# Patient Record
Sex: Male | Born: 1935 | Race: White | Hispanic: No | Smoking: Former smoker
Health system: Southern US, Community
[De-identification: ages and names within clinical notes are randomized; demographics above are authoritative.]

## PROBLEM LIST (undated history)

## (undated) DIAGNOSIS — I4891 Unspecified atrial fibrillation: Secondary | ICD-10-CM

## (undated) DIAGNOSIS — M5136 Other intervertebral disc degeneration, lumbar region: Secondary | ICD-10-CM

## (undated) DIAGNOSIS — I10 Essential (primary) hypertension: Secondary | ICD-10-CM

## (undated) DIAGNOSIS — E079 Disorder of thyroid, unspecified: Secondary | ICD-10-CM

## (undated) DIAGNOSIS — C61 Malignant neoplasm of prostate: Secondary | ICD-10-CM

## (undated) DIAGNOSIS — N390 Urinary tract infection, site not specified: Secondary | ICD-10-CM

## (undated) DIAGNOSIS — G43909 Migraine, unspecified, not intractable, without status migrainosus: Secondary | ICD-10-CM

## (undated) DIAGNOSIS — D696 Thrombocytopenia, unspecified: Secondary | ICD-10-CM

## (undated) DIAGNOSIS — N2889 Other specified disorders of kidney and ureter: Secondary | ICD-10-CM

## (undated) DIAGNOSIS — E039 Hypothyroidism, unspecified: Secondary | ICD-10-CM

## (undated) DIAGNOSIS — I639 Cerebral infarction, unspecified: Secondary | ICD-10-CM

## (undated) DIAGNOSIS — M51369 Other intervertebral disc degeneration, lumbar region without mention of lumbar back pain or lower extremity pain: Secondary | ICD-10-CM

## (undated) DIAGNOSIS — R1310 Dysphagia, unspecified: Secondary | ICD-10-CM

## (undated) HISTORY — DX: Malignant neoplasm of prostate: C61

## (undated) HISTORY — PX: LAPAROSCOPIC CHOLECYSTECTOMY: SUR755

## (undated) HISTORY — PX: PROSTATE BIOPSY: SHX241

## (undated) HISTORY — PX: ESOPHAGOGASTRODUODENOSCOPY: SHX1529

---
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
## 1997-05-25 HISTORY — PX: COLONOSCOPY: SHX174

## 2002-01-09 ENCOUNTER — Ambulatory Visit (HOSPITAL_COMMUNITY): Admission: RE | Admit: 2002-01-09 | Discharge: 2002-01-09 | Payer: Self-pay | Admitting: Internal Medicine

## 2007-07-07 ENCOUNTER — Emergency Department (HOSPITAL_COMMUNITY): Admission: EM | Admit: 2007-07-07 | Discharge: 2007-07-07 | Payer: Self-pay | Admitting: Emergency Medicine

## 2007-10-18 ENCOUNTER — Ambulatory Visit (HOSPITAL_COMMUNITY): Admission: RE | Admit: 2007-10-18 | Discharge: 2007-10-18 | Payer: Self-pay | Admitting: Family Medicine

## 2007-11-15 ENCOUNTER — Ambulatory Visit: Payer: Self-pay | Admitting: Gastroenterology

## 2007-11-24 ENCOUNTER — Ambulatory Visit (HOSPITAL_COMMUNITY): Admission: RE | Admit: 2007-11-24 | Discharge: 2007-11-24 | Payer: Self-pay | Admitting: Gastroenterology

## 2007-11-24 ENCOUNTER — Encounter: Payer: Self-pay | Admitting: Gastroenterology

## 2007-11-24 ENCOUNTER — Ambulatory Visit: Payer: Self-pay | Admitting: Gastroenterology

## 2008-01-18 ENCOUNTER — Ambulatory Visit: Payer: Self-pay | Admitting: Gastroenterology

## 2008-02-07 ENCOUNTER — Ambulatory Visit (HOSPITAL_COMMUNITY): Admission: RE | Admit: 2008-02-07 | Discharge: 2008-02-07 | Payer: Self-pay | Admitting: Family Medicine

## 2008-06-05 ENCOUNTER — Ambulatory Visit (HOSPITAL_COMMUNITY): Admission: RE | Admit: 2008-06-05 | Discharge: 2008-06-05 | Payer: Self-pay | Admitting: Family Medicine

## 2008-06-21 ENCOUNTER — Encounter (HOSPITAL_COMMUNITY): Admission: RE | Admit: 2008-06-21 | Discharge: 2008-07-21 | Payer: Self-pay | Admitting: Family Medicine

## 2008-11-12 ENCOUNTER — Ambulatory Visit (HOSPITAL_COMMUNITY): Admission: RE | Admit: 2008-11-12 | Discharge: 2008-11-12 | Payer: Self-pay | Admitting: Family Medicine

## 2008-12-06 ENCOUNTER — Ambulatory Visit (HOSPITAL_COMMUNITY): Admission: RE | Admit: 2008-12-06 | Discharge: 2008-12-06 | Payer: Self-pay | Admitting: Family Medicine

## 2008-12-12 ENCOUNTER — Ambulatory Visit: Payer: Self-pay | Admitting: Orthopedic Surgery

## 2008-12-12 DIAGNOSIS — M84376A Stress fracture, unspecified foot, initial encounter for fracture: Secondary | ICD-10-CM | POA: Insufficient documentation

## 2008-12-12 DIAGNOSIS — S92309A Fracture of unspecified metatarsal bone(s), unspecified foot, initial encounter for closed fracture: Secondary | ICD-10-CM | POA: Insufficient documentation

## 2009-01-29 ENCOUNTER — Ambulatory Visit: Payer: Self-pay | Admitting: Orthopedic Surgery

## 2010-06-16 ENCOUNTER — Encounter: Payer: Self-pay | Admitting: Family Medicine

## 2010-10-07 NOTE — Consult Note (Signed)
NAME:  Ethan Faulkner, Ethan Faulkner NO.:  [id number]   MEDICAL RECORD NO.:  [id number]         PATIENT TYPE:  PAMB   LOCATION:  DAY                           FACILITY:  [facility]   PHYSICIAN:  Kassie Mends, M.D.      DATE OF BIRTH:  Dec 21, 1915   DATE OF CONSULTATION:  11/15/2007  DATE OF DISCHARGE:                                 CONSULTATION   REFERRING PHYSICIAN:  Scott A. Luking, MD   REASON FOR CONSULTATION:  Abdominal pain and weight loss.   HISTORY OF PRESENT ILLNESS:  Ethan Faulkner is an 77 year old male who was  last seen by Dr. Karilyn Cota in 2003.  He had a colonoscopy in 2000, which  showed 8 small polyps in the ascending colon.  The large one was 8-9 mm.  The pathology revealed tubular adenomas.  He was seen in 2003  complaining of uncontrolled reflux according to the note.  A new onset  of heartburn.  EGD revealed erosive gastritis with pyloric channel  inflammation.  He presents today with unintentional weight loss over the  last 6 years.  His daughter thinks it is because his wife's health has  declined and he is continually caring for her.  He states his appetite  is good.  He complains of abdominal pain off and on for some time.  He  describes as a burning sensation in the mid upper and mid lower abdomen.  He also has lots of gas.  The pain occurs at least once a week.  Occasionally, he has been awakening at nighttime with pain.  He was  being maintained on Celebrex.  He also was given nabumetone in May 2009  for approximately 7-10 days for low back pain.  His low back pain is  better now, he is using it intermittently.  He also had a history of  using some Advil every now and then.  He denies any aspirin.  He denies  any blood in stool, black stools, problem swallowing, chest pain,  shortness of breath, dyspnea on exertion, nausea, or vomiting.  He gets  5-6 hours of sleep at night.  He had no problems with the prep with  sedation previously.  He has epigastric burning  pain, it is brought only  by spicy foods.  He has 1-2 bowel movements a week.  MiraLax 17 g daily  for 2 doses gave him diarrhea.   PAST MEDICAL HISTORY:  1. Prostate cancer.  2. Back pain.  3. Constipation.   PAST SURGICAL HISTORY:  Cholecystectomy due to gallstones approximately  25 years ago.   ALLERGIES:  No known drug allergies.   MEDICATIONS:  1. Protonix 40 mg daily.  2. Nabumetone 500 mg as needed.  3. Docusate sodium 240 mg daily.  4. Advil as needed.   FAMILY HISTORY:  He denies any family history of colon cancer or colon  polyps.   SOCIAL HISTORY:  He is very active.  His family had to take away his  ladder, so he could not get up on the roof and put roof on.  He has been  putting on  the siding.  He is married.  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  He rarely smoked in the past.  He  denies any alcohol use.  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   REVIEW OF SYSTEMS:  Protonix has made the abdominal pain better.  His  review of systems per the HPI, otherwise all systems are negative.   PHYSICAL EXAM:  Weight 142 pounds, height 5 feet 10, BMI 20.4 (healthy),  temperature 98.7, blood pressure 138/72, pulse 72.  In general, he is in  no apparent distress, alert and oriented x4.  He is hard of hearing.  HEENT exam, atraumatic, normocephalic.  Pupils are equal and reactive to  light.  Mouth, no oral lesions.  Posterior pharynx without erythema or  exudate.  His neck has full range of motion, no lymphadenopathy.  His  lungs are clear to auscultation bilaterally.  Cardiovascular exam has  regular rhythm, no murmur, normal S1 and S2.  His abdomen, bowel sounds  are present, soft, nontender, and nondistended.  No rebound or guarding,  no abdominal bruits, pulsatile masses, or hepatosplenomegaly.  Extremities, no cyanosis or edema.  Neuro, he has no focal neurologic  deficits.   LABS:  On Oct 18, 2005,  white count 3.9, hemoglobin 14.5, platelets 144,  BUN 18, creatinine 0.95, total bili 0.5, alk phos 46, AST 16, ALT 10,  and albumin 3.9.   ASSESSMENT:  Ethan Faulkner is an 77 year old male with epigastric and lower  pelvic pain.  The differential diagnosis includes functional abdominal  pain, gastroesophageal reflux disease, peptic ulcer disease, and a low  likelihood of colon cancer, or gastric malignancy.  Thank you for  allowing me to see Ethan Faulkner in consultation.  My recommendation is as  follow.   RECOMMENDATIONS:  1. Will schedule colonoscopy for his history of abdominal pain, weight      loss, and polyps to rule out colorectal cancer and he will be      scheduled for an upper endoscopy for his new onset dyspepsia, which      is likely related to NSAID use or Helicobacter pylori gastritis.      During his last upper endoscopy, his gastric mucosa was not      biopsied.  2. He should continue his Protonix daily.  3. He will be scheduled a follow up appointment with me as needed.      Kassie Mends, M.D.  Electronically Signed     SM/MEDQ  D:  11/15/2007  T:  11/16/2007  Job:  [id number]   cc:   Lorin Picket A. Gerda Diss, MD  Fax: [phone]   Lionel December, M.D.  Fax: [phone]

## 2010-10-07 NOTE — Assessment & Plan Note (Signed)
NAME:  MOUSTAFA, MOSSA               CHART#:  [id number]   DATE:  01/18/2008                       DOB:  [birth year]   REFERRING PHYSICIAN:  Scott A. Luking, MD   PROBLEM LIST:  1. Epigastric pain secondary to H. pylori gastritis.  2. Simple adenomas on colonoscopy in July 2009.  3. History of prostrate cancer.  4. Back pain.  5. Constipation.  6. Cholecystectomy due to gallstones approximately 25 years ago.   SUBJECTIVE:  The patient is an 77 year old male who was initially seen  in June 2009.  He was complaining of weight loss and abdominal pain.  EGD with biopsy revealed H. pylori gastritis.  He was treated with  amoxicillin and Flagyl.  His abdominal pain has now resolved.  He denies  taking any nabumetone or Advil as needed.  His daughter is not certain  that he is taking Protonix.   OBJECTIVE:  VITAL SIGNS:  Weight 147 pounds (up 5 pounds since June  2009), height 5 feet 10 inches, BMI 21.1 (healthy), temperature 98.2,  blood pressure 112/82, and pulse 60.  GENERAL:  He is in no apparent distress.  Alert and interactive.LUNGS:  Clear to auscultation bilaterally.CARDIOVASCULAR:  Regular  rhythm.ABDOMEN:  Bowel sounds are present, soft, nontender and  nondistended.   ASSESSMENT:  The patient is an 77 year old male who had epigastric pain,  which is secondary to H. pylori gastritis.  He is no longer taking  NSAIDs.  Thank you for allowing me to see the patient in consultation.  My recommendations is as follow.   RECOMMENDATIONS:  1. The patient and his daughter are informed that he should take      Protonix if he restarts any antiinflammatory drugs such as      Celebrex, nabumetone, or Advil.  2. He may follow with me as needed.  3. If he remains healthy, he should have a colonoscopy in 10-15 years.       Kassie Mends, M.D.  Electronically Signed     SM/MEDQ  D:  01/18/2008  T:  01/18/2008  Job:  [id number]   cc:   Lorin Picket A. Gerda Diss, MD

## 2010-10-07 NOTE — Op Note (Signed)
NAME:  Ethan Faulkner, Ethan Faulkner NO.:  [id number]   MEDICAL RECORD NO.:  [id number]          PATIENT TYPE:  AMB   LOCATION:  DAY                           FACILITY:  [facility]   PHYSICIAN:  Kassie Mends, M.D.      DATE OF BIRTH:  10-May-1916   DATE OF PROCEDURE:  DATE OF DISCHARGE:                               OPERATIVE REPORT   REFERRING PHYSICIAN:  Scott A. Gerda Diss, MD   PROCEDURE:  1. Ileocolonoscopy with cold forceps polypectomy and snare cautery      polypectomy.  2. Esophagogastroduodenoscopy with cold forceps biopsy.   INDICATIONS FOR EXAM:  Mr. Campanaro is an 77 year old male with history of  prostate cancer and back pain.  He was taken Celebrex, nabumetone, and  Advil.  He complains of epigastric burning pain.  He also has lower  pelvic pain.   FINDINGS:  1. Normal terminal ileum, approximately 10 cm visualized.  2. Multiple polyps removed in the ascending colon, transverse colon,      and sigmoid colon.  Each polyp was between 3-6 mm.  The largest      polyp, which was in the ascending colon was removed via snare      cautery.  All other polyps were removed via cold forceps.  They      were sessile.  3. Rare sigmoid colon diverticulosis and transverse colon diverticula.      Otherwise no masses, inflammatory changes, or AVM seen.  4. Normal retroflexed view of the rectum.  5. Normal esophagus without evidence of Barrett's mass, erosion,      ulceration, or stricture.  6. Diffuse erythema beginning in the proximal stomach and extending      into the antrum.  He had few erosions in the antrum.  Biopsies      obtained via cold forceps to evaluate for H. pylori gastritis.  7. Normal duodenal bulb and second portion of the duodenum with      moderate bile staining.   DIAGNOSES:  1. The most likely etiology for Mr. Castleman's epigastric pain is      gastritis.  2. No source for his lower pelvic pain identified.   RECOMMENDATIONS:  1. He should stop nabumetone and  Advil for 30 days.  He should use      Tylenol Arthritis as needed for pain.  2. He should follow high-fiber diet.  He is given a handout on high-      fiber diet.  He should avoid gastric irritants.  He is given a      handout on gastric irritants and gastritis.  3. Will call Mr. Hamm with the results of his polypectomy.  4. If his lower pelvic pain exists or persist then would see urology.  5. He was given a handout on polyps.   MEDICATIONS:  1. Demerol 50 mg IV.  2. Versed 3 mg IV.   PROCEDURE TECHNIQUE:  Physical exam was performed.  Informed consent was  obtained from the patient after explaining the benefits, risks, and  alternatives to the procedure.  The patient was connected to monitor and  placed in left lateral position.  Continuous oxygen was provided by  nasal cannula.  IV medicine administered through an indwelling cannula.  After administration of sedation and rectal exam, the patient's rectum  was intubated.  The scope was advanced under direct visualization to the  distal terminal ileum.  Scope was removed slowly by carefully examining  the color, texture, anatomy, and integrity of the mucosa on the way out.  The patient was recovered in endoscopy.   After the colonoscopy, the patient's esophagus was intubated.  The scope  was advanced under direct visualization to the second portion of the  duodenum.  Scope was removed slowly by carefully examining the color,  texture, anatomy, and integrity of the mucosa on the way out.  The  patient was recovered in endoscopy discharged home in satisfactory  condition.   PATH:  Simple adenomas. H. pylori gastritis.      Kassie Mends, M.D.  Electronically Signed     SM/MEDQ  D:  11/24/2007  T:  11/25/2007  Job:  [id number]   cc:   Lorin Picket A. Gerda Diss, MD  Fax: [phone]

## 2010-10-10 NOTE — Consult Note (Signed)
NAME:  Ethan Faulkner, Ethan L.                       ACCOUNT NO.:  [id number]   MEDICAL RECORD NO.:  [id number]                  PATIENT TYPE:   LOCATION:                                       FACILITY:   PHYSICIAN:  Lionel December, M.D.                 DATE OF BIRTH:  05/26/1915   DATE OF CONSULTATION:  01/03/2002  DATE OF DISCHARGE:                           GASTROENTEROLOGY CONSULTATION   PRESENTING COMPLAINT:  Burning retrosternal and epigastric pain of recent  onset.   HISTORY OF PRESENT ILLNESS:  The patient is an 77 year old Caucasian male  who was referred through the courtesy of Dr. Regino Schultze for evaluation of  recent onset of heartburn and epigastric burning.  These symptoms started  maybe two or two and a half weeks ago.  For a few days he had intractable  heartburn.  He could not even lie flat and had to rest in a propped up  position.  He also noted drop in his appetite, but did not experience any  nausea, vomiting, hoarseness, cough, dyspnea, melena, or rectal bleeding.  He has had heartburn occasionally prior to this episode, but nothing quite  like this time.  He was seen by Dr. Ethlyn Gallery on December 29, 2001 and he  was begun on Zantac 150 mg p.o. b.i.d.  After having it a few days he felt a  lot better.  He states his appetite is back.  He also had an episode of  precordial pain which was fleeting.  He had an EKG by Dr. Regino Schultze which was  within normal limits.  Review of systems also positive for intermittent  constipation.  He used an enema earlier today and feels sore in his anal  area.  He has lost 9 or 10 pounds within the last few weeks.  He does not  take any NSAIDs.   He is on Zantac 150 mg p.o. b.i.d. and Lupron injection every three months.   PAST MEDICAL HISTORY:  History of prostate CA.  Initially received radiation  therapy about eight years and now on maintenance therapy with Lupron.  His  last PSA was very low.  He had total colonoscopy in May 2000  because of heme-  positive stools.  He had a 12 mm tubular adenoma removed from his ascending  colon.  Follow-up colonoscopy is recommended in five years which would be a  little less than two years from now.  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  He had cholecystectomy about seven years  ago.   ALLERGIES:  None known.   FAMILY HISTORY:  Both parents are deceased.  He has four brothers and two  sisters living and are in fair health.  He lost one sister at young age of  typhoid fever.   SOCIAL HISTORY:  He is married and has seven children in good health.  He  worked at [employer] for over  36 years.  He is now retired.  He has never smoked  cigarettes and does not drink alcohol.   PHYSICAL EXAMINATION:  GENERAL:  Pleasant, well-developed, thin Caucasian male.  Appears to be in  no acute distress.   VITAL SIGNS:  Weight 154 pounds, height 5 feet 10 inches, pulse 76 per  minute, blood pressure 160/90.  He is afebrile.   HEENT:  Conjunctivae are pink.  Sclerae are nonicteric.  Oropharyngeal  mucosa is normal.  He is edentulous.  He does have dentures at home.   NECK:  Without masses or thyromegaly.  Carotids are 2+ bilaterally without  bruits.   CARDIAC:  Regular rhythm.  Normal S1 and S3.  No murmur or gallop noted.   LUNGS:  Clear to auscultation.   ABDOMEN:  Symmetrical.  Bowel sounds are normal.  Palpation reveals soft  abdomen without tenderness, organomegaly, or masses.   RECTAL:  Trace heme-positive stool on the edge.   EXTREMITIES:  No peripheral edema or clubbing noted.   ASSESSMENT:  The patient is an 77 year old Caucasian male with a few weeks'  history of intractable heartburn and epigastric discomfort who has responded  rather dramatically to H2B.  This is an exception rather than rule because  most people do not respond well to H2B.  He also had some weight loss  associated with these symptoms but this seemed to have leveled off.   His  stool is heme-positive.  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  Given the more or less acute nature of his  symptoms, he needs to have esophagogastroduodenoscopy to make sure he does  not have unsuspected disease in his upper gastrointestinal tract.   History of colonic polyps.  He will need another colonoscopy in May 2005.   Constipation.   RECOMMENDATIONS:  He is to continue Zantac at 150 mg p.o. b.i.d.  Antireflux  measures recommended.  Written instructions given.  I would like to increase  fiber in his diet and start Colace one to two tablets q.d.  Diagnostic  esophagogastroduodenoscopy to be performed at [facility] in near future.   I have reviewed the procedure risks with the patient.  He wants to have it  done with conscious sedation.   I would like to thank Dr. Regino Schultze for giving [place] the opportunity to  participate in the care of this gentleman.                                               Lionel December, M.D.    NR/MEDQ  D:  01/03/2002  T:  01/09/2002  Job:  [id number]   cc:   Kirk Ruths, M.D.  [address]  [place]  [place] [address]  Fax: [phone]

## 2010-10-10 NOTE — Op Note (Signed)
NAME:  Ethan Faulkner, Ethan Faulkner NO.:  [id number]   MEDICAL RECORD NO.:  [id number]                   PATIENT TYPE:  AMB   LOCATION:  DAY                                  FACILITY:  [facility]   PHYSICIAN:  Lionel December, M.D.                 DATE OF BIRTH:  05/22/1916   DATE OF PROCEDURE:  DATE OF DISCHARGE:                                 OPERATIVE REPORT   PROCEDURE:  Esophagogastroduodenoscopy.   ENDOSCOPIST:  Lionel December, M.D.   INDICATIONS:  This patient is an 77 year old Caucasian male with new onset  of reflux esophagitis with intractable symptoms who has surprisingly  responded to H2P.  He is undergoing EGD to make sure that he does not have  peptic ulcer disease or Barrett esophagus.  The procedure and risks were  reviewed with the patient  and informed consent was obtained.   PREOPERATIVE MEDICATIONS:  Cetacaine spray for pharyngeal topical  anesthesia, Demerol 25 mg IV and Versed 2.5 mg IV.   INSTRUMENT:  Olympus video system.   FINDINGS:  Procedure performed in endoscopy suite.  The patient's vital  signs and O2 saturation were monitored during the procedure and remained  stable.  The patient was placed in the left lateral recumbent position and  endoscope was passed via the oropharynx without any difficulty into the  esophagus.   There was a tiny cyst along the outer aspect of the right epiglottic fold.  Pictures were taken for record.   ESOPHAGUS:  Mucosa of the esophagus was normal throughout.  Squamocolumnar  junction was serrated or wavy but there was no hiatal hernia.   STOMACH:  It was empty and distended very well with insufflation.  The folds  of the proximal stomach were prominent.  The mucosal was normal.  Antral  mucosa revealed a few small erosions.  The pylorus was somewhat deformed and  the mucosa was friable on passing the scope through it.  It was felt to be  patent.  There was no ulcer crater.  The scope was retroflexed to  exam the  angularis, fundus, and cardia; all of which were normal.   DUODENUM:  Examination of the bulb and second part of the duodenum was  normal.   The endoscope was withdrawn.  The patient tolerated the procedure well.   FINAL DIAGNOSES:  1. Serrated gastroesophageal junction but no evidence of erosive esophagitis     or Barrett's segment.  2. Erosive gastritis with pyloric channel inflammation.  Pylorus, however,     is patent.  3. Small mucosal cyst at right hypopharynx.   RECOMMENDATIONS:  He will continue antireflux measures with Zantac 150 mg  p.o. b.i.d.  H. pylori serology will be checked.  If it is positive, he will  be treated.   As long as symptoms are well controlled with H2P he will stay on it;  however, if he has breakthrough symptoms  he will be switched to PPI.                                               Lionel December, M.D.    NR/MEDQ  D:  01/09/2002  T:  01/09/2002  Job:  [id number]   cc:   Kirk Ruths, M.D.  [address]  [place]  [place] [address]  Fax: [phone]

## 2010-12-04 ENCOUNTER — Other Ambulatory Visit (HOSPITAL_COMMUNITY): Payer: Self-pay | Admitting: Urology

## 2010-12-05 ENCOUNTER — Ambulatory Visit (HOSPITAL_COMMUNITY)
Admission: RE | Admit: 2010-12-05 | Discharge: 2010-12-05 | Disposition: A | Discharge status: Home / Self Care | Payer: Medicare Other | Source: Ambulatory Visit | Attending: Urology | Admitting: Urology

## 2010-12-05 DIAGNOSIS — N289 Disorder of kidney and ureter, unspecified: Secondary | ICD-10-CM | POA: Insufficient documentation

## 2010-12-05 DIAGNOSIS — R319 Hematuria, unspecified: Secondary | ICD-10-CM | POA: Insufficient documentation

## 2010-12-05 DIAGNOSIS — M899 Disorder of bone, unspecified: Secondary | ICD-10-CM | POA: Insufficient documentation

## 2010-12-05 DIAGNOSIS — R1031 Right lower quadrant pain: Secondary | ICD-10-CM | POA: Insufficient documentation

## 2010-12-05 MED ORDER — IOHEXOL 300 MG/ML  SOLN
125.0000 mL | Freq: Once | INTRAMUSCULAR | Status: AC | PRN
  Administered 2010-12-05: 125 mL via INTRAVENOUS

## 2011-05-28 DIAGNOSIS — J40 Bronchitis, not specified as acute or chronic: Secondary | ICD-10-CM | POA: Diagnosis not present

## 2011-05-28 DIAGNOSIS — J111 Influenza due to unidentified influenza virus with other respiratory manifestations: Secondary | ICD-10-CM | POA: Diagnosis not present

## 2011-07-07 DIAGNOSIS — C61 Malignant neoplasm of prostate: Secondary | ICD-10-CM | POA: Diagnosis not present

## 2011-07-10 DIAGNOSIS — J069 Acute upper respiratory infection, unspecified: Secondary | ICD-10-CM | POA: Diagnosis not present

## 2011-07-14 DIAGNOSIS — C61 Malignant neoplasm of prostate: Secondary | ICD-10-CM | POA: Diagnosis not present

## 2011-09-10 DIAGNOSIS — J441 Chronic obstructive pulmonary disease with (acute) exacerbation: Secondary | ICD-10-CM | POA: Diagnosis not present

## 2011-09-10 DIAGNOSIS — K819 Cholecystitis, unspecified: Secondary | ICD-10-CM | POA: Diagnosis not present

## 2011-10-25 DIAGNOSIS — R05 Cough: Secondary | ICD-10-CM | POA: Diagnosis not present

## 2011-11-03 DIAGNOSIS — J209 Acute bronchitis, unspecified: Secondary | ICD-10-CM | POA: Diagnosis not present

## 2011-11-04 DIAGNOSIS — R5381 Other malaise: Secondary | ICD-10-CM | POA: Diagnosis not present

## 2011-11-04 DIAGNOSIS — R5383 Other fatigue: Secondary | ICD-10-CM | POA: Diagnosis not present

## 2011-11-04 DIAGNOSIS — E785 Hyperlipidemia, unspecified: Secondary | ICD-10-CM | POA: Diagnosis not present

## 2011-11-04 DIAGNOSIS — Z79899 Other long term (current) drug therapy: Secondary | ICD-10-CM | POA: Diagnosis not present

## 2011-11-04 DIAGNOSIS — Z0000 Encounter for general adult medical examination without abnormal findings: Secondary | ICD-10-CM | POA: Diagnosis not present

## 2011-11-17 DIAGNOSIS — Z0000 Encounter for general adult medical examination without abnormal findings: Secondary | ICD-10-CM | POA: Diagnosis not present

## 2011-11-17 DIAGNOSIS — Z125 Encounter for screening for malignant neoplasm of prostate: Secondary | ICD-10-CM | POA: Diagnosis not present

## 2011-11-17 DIAGNOSIS — Z1211 Encounter for screening for malignant neoplasm of colon: Secondary | ICD-10-CM | POA: Diagnosis not present

## 2012-01-11 DIAGNOSIS — C61 Malignant neoplasm of prostate: Secondary | ICD-10-CM | POA: Diagnosis not present

## 2012-01-11 DIAGNOSIS — R319 Hematuria, unspecified: Secondary | ICD-10-CM | POA: Diagnosis not present

## 2012-02-17 DIAGNOSIS — K5900 Constipation, unspecified: Secondary | ICD-10-CM | POA: Diagnosis not present

## 2012-02-17 DIAGNOSIS — Z23 Encounter for immunization: Secondary | ICD-10-CM | POA: Diagnosis not present

## 2012-05-16 DIAGNOSIS — J0100 Acute maxillary sinusitis, unspecified: Secondary | ICD-10-CM | POA: Diagnosis not present

## 2012-05-23 DIAGNOSIS — J019 Acute sinusitis, unspecified: Secondary | ICD-10-CM | POA: Diagnosis not present

## 2012-05-23 DIAGNOSIS — J42 Unspecified chronic bronchitis: Secondary | ICD-10-CM | POA: Diagnosis not present

## 2012-11-21 ENCOUNTER — Encounter: Payer: Self-pay | Admitting: *Deleted

## 2012-11-29 ENCOUNTER — Ambulatory Visit: Payer: Self-pay | Admitting: Family Medicine

## 2013-02-28 ENCOUNTER — Ambulatory Visit: Payer: Self-pay

## 2013-02-28 ENCOUNTER — Encounter: Payer: Self-pay | Admitting: Family Medicine

## 2013-02-28 ENCOUNTER — Ambulatory Visit (INDEPENDENT_AMBULATORY_CARE_PROVIDER_SITE_OTHER): Payer: Medicare Other | Admitting: Family Medicine

## 2013-02-28 VITALS — BP 108/62 | Temp 98.3°F | Ht 70.0 in | Wt 161.0 lb

## 2013-02-28 DIAGNOSIS — Z125 Encounter for screening for malignant neoplasm of prostate: Secondary | ICD-10-CM

## 2013-02-28 DIAGNOSIS — R5381 Other malaise: Secondary | ICD-10-CM

## 2013-02-28 DIAGNOSIS — R29898 Other symptoms and signs involving the musculoskeletal system: Secondary | ICD-10-CM | POA: Diagnosis not present

## 2013-02-28 DIAGNOSIS — Z23 Encounter for immunization: Secondary | ICD-10-CM

## 2013-02-28 DIAGNOSIS — R630 Anorexia: Secondary | ICD-10-CM

## 2013-02-28 LAB — CBC WITH DIFFERENTIAL/PLATELET
Basophils Relative: 0 % (ref 0–1)
Eosinophils Absolute: 0.1 10*3/uL (ref 0.0–0.7)
Eosinophils Relative: 2 % (ref 0–5)
Hemoglobin: 14.8 g/dL (ref 13.0–17.0)
MCH: 30.6 pg (ref 26.0–34.0)
MCHC: 34.3 g/dL (ref 30.0–36.0)
MCV: 89.2 fL (ref 78.0–100.0)
Monocytes Relative: 10 % (ref 3–12)
Neutrophils Relative %: 58 % (ref 43–77)

## 2013-02-28 LAB — BASIC METABOLIC PANEL
CO2: 25 mEq/L (ref 19–32)
Calcium: 9.1 mg/dL (ref 8.4–10.5)
Creat: 1.5 mg/dL — ABNORMAL HIGH (ref 0.50–1.35)
Glucose, Bld: 92 mg/dL (ref 70–99)
Sodium: 138 mEq/L (ref 135–145)

## 2013-02-28 LAB — HEPATIC FUNCTION PANEL
AST: 15 U/L (ref 0–37)
Alkaline Phosphatase: 49 U/L (ref 39–117)
Bilirubin, Direct: 0.1 mg/dL (ref 0.0–0.3)
Total Bilirubin: 0.6 mg/dL (ref 0.3–1.2)

## 2013-02-28 LAB — PSA, MEDICARE: PSA: <0.01 ng/mL (ref <=4.00)

## 2013-02-28 NOTE — Progress Notes (Signed)
  Subjective:    Patient ID: Ethan Faulkner, male    DOB: [birth year], 79 y.o.   MRN: [id number]  HPI Patient arrives with complaint of not feeling well. Feels tired and shaky at times. Not eating like he should. Also had company lately and did a lot of running and feels worn out. Patient relates some weakness and legs he also relates feeling run down he also states fatigue tiredness in addition to this denies vomiting diarrhea bloody stools hematuria has history of prostate cancer his weight is stable appetite fluctuates does not need as healthy as he should. Patient denies any other particular troubles currently. Past medical history prostate cancer Family history noncontributory multiple relatives lived into their 95s Social does not smoke or drink  Review of Systems See above    Objective:   Physical Exam Lungs are clear hearts regular abdomen soft extremities no edema skin warm dry pulse normal blood pressure rechecked 118/70 neck no masses eardrums normal throat       Assessment & Plan:  Leg weakness, bilateral - Plan: Hepatic function panel, Basic metabolic panel, Sedimentation rate, CBC with Differential  Loss of appetite - Plan: Hepatic function panel, Basic metabolic panel, Sedimentation rate, CBC with Differential  Other malaise and fatigue - Plan: Hepatic function panel, Basic metabolic panel, Sedimentation rate, CBC with Differential  Need for prophylactic vaccination and inoculation against influenza  Special screening for malignant neoplasm of prostate - Plan: PSA, Medicare  We will go ahead and do lab work bring the patient back in 4 weeks. 2530 minutes spent with family discussing all of this issue

## 2013-04-03 ENCOUNTER — Ambulatory Visit: Payer: Medicare Other | Admitting: Family Medicine

## 2013-04-06 ENCOUNTER — Other Ambulatory Visit: Payer: Self-pay | Admitting: Family Medicine

## 2013-04-19 ENCOUNTER — Ambulatory Visit: Payer: Medicare Other | Admitting: Family Medicine

## 2013-06-06 ENCOUNTER — Encounter: Payer: Self-pay | Admitting: Family Medicine

## 2013-06-06 ENCOUNTER — Ambulatory Visit (INDEPENDENT_AMBULATORY_CARE_PROVIDER_SITE_OTHER): Payer: Medicare Other | Admitting: Family Medicine

## 2013-06-06 VITALS — BP 112/74 | Ht 70.0 in | Wt 166.0 lb

## 2013-06-06 DIAGNOSIS — I4891 Unspecified atrial fibrillation: Secondary | ICD-10-CM

## 2013-06-06 DIAGNOSIS — R000 Tachycardia, unspecified: Secondary | ICD-10-CM | POA: Diagnosis not present

## 2013-06-06 NOTE — Progress Notes (Signed)
   Subjective:    Patient ID: Ethan Faulkner, male    DOB: October 26, 1915, 80 y.o.   MRN: [id number]  HPI Patient is here today for a check up. Patient states overall he's been feeling good he denies any chest tightness pressure pain shortness breath he states energy level good appetite good. Not on any new medicines. Does not use caffeine or other things.  He would like for you to check his ears b/c they feel like they are "plugged up". Patient denies any chest tightness pressure pain shortness of breath he states his energy level overall doing fairly well he is just here for routine check He has no concerns/questions.    Review of Systems  Constitutional: Negative for fever, activity change and appetite change.  HENT: Negative for congestion and rhinorrhea.   Eyes: Negative for discharge.  Respiratory: Negative for cough and wheezing.   Cardiovascular: Negative for chest pain.  Gastrointestinal: Negative for vomiting, abdominal pain and blood in stool.  Genitourinary: Negative for frequency and difficulty urinating.  Musculoskeletal: Negative for neck pain.  Skin: Negative for rash.  Allergic/Immunologic: Negative for environmental allergies and food allergies.  Neurological: Negative for weakness and headaches.  Psychiatric/Behavioral: Negative for agitation.       Objective:   Physical Exam  Constitutional: He appears well-developed and well-nourished.  HENT:  Head: Normocephalic and atraumatic.  Right Ear: External ear normal.  Left Ear: External ear normal.  Nose: Nose normal.  Mouth/Throat: Oropharynx is clear and moist.  Neck: Normal range of motion. Neck supple. No thyromegaly present.  Cardiovascular: Normal rate, regular rhythm and normal heart sounds.   No murmur heard. Pulmonary/Chest: Effort normal and breath sounds normal. No respiratory distress. He has no wheezes.  Abdominal: Soft. Bowel sounds are normal. He exhibits no distension and no mass. There is no  tenderness.  Musculoskeletal: Normal range of motion. He exhibits no edema.  Lymphadenopathy:    He has no cervical adenopathy.  Neurological: He is alert. He exhibits normal muscle tone.  Skin: Skin is warm and dry. No erythema.  Psychiatric: He has a normal mood and affect. His behavior is normal. Judgment normal.   minimal wax in the ears. Error: Patient having tachycardia. He is not aware of that. Heart rate is approximately 110-120. Slightly irregular.  Abnormal EKG. Patient has significant changes in the EKG. Probable ventricular strain along with atrial fibrillation. Previous EKG from 2009 did not have these changes.     Assessment & Plan:  Asymptomatic tachycardia-we will go ahead with doing some lab work. In addition to this we will do echo. Will set up cardiology referral. May need to be on medication to help control the rate. I told the patient to go ahead and start 81mg  aspirin daily. 35 to 40 minutes spent with patient.  I did discuss the case with cardiology. They agree with the above treatment measures. We also started metoprolol 25 mg daily.  Probable atrial fibrillation with rapid ventricular response warning signs were discussed with Tammy regarding if he starts having sweating shortness of breath passing out or other problems immediately 911 or go to the ER.  He will need to followup for a nurse visit to irrigate the ears do to severe wax

## 2013-06-07 ENCOUNTER — Other Ambulatory Visit: Payer: Self-pay

## 2013-06-07 MED ORDER — METOPROLOL SUCCINATE ER 25 MG PO TB24: 25.0000 mg | ORAL_TABLET | Freq: Every day | ORAL | Status: DC

## 2013-06-09 DIAGNOSIS — I4891 Unspecified atrial fibrillation: Secondary | ICD-10-CM | POA: Insufficient documentation

## 2013-06-09 LAB — BASIC METABOLIC PANEL
BUN: 26 mg/dL — ABNORMAL HIGH (ref 6–23)
CALCIUM: 9.3 mg/dL (ref 8.4–10.5)
CO2: 28 mEq/L (ref 19–32)
CREATININE: 1.02 mg/dL (ref 0.50–1.35)
Chloride: 105 mEq/L (ref 96–112)
Glucose, Bld: 93 mg/dL (ref 70–99)
Potassium: 4.5 mEq/L (ref 3.5–5.3)
SODIUM: 142 meq/L (ref 135–145)

## 2013-06-09 LAB — TSH: TSH: 4.838 u[IU]/mL — AB (ref 0.350–4.500)

## 2013-06-09 LAB — T4, FREE: FREE T4: 1.11 ng/dL (ref 0.80–1.80)

## 2013-06-12 ENCOUNTER — Other Ambulatory Visit: Payer: Self-pay | Admitting: Family Medicine

## 2013-06-12 ENCOUNTER — Ambulatory Visit (HOSPITAL_COMMUNITY)
Admission: RE | Admit: 2013-06-12 | Discharge: 2013-06-12 | Disposition: A | Discharge status: Home / Self Care | Payer: Medicare Other | Source: Ambulatory Visit | Attending: Family Medicine | Admitting: Family Medicine

## 2013-06-12 DIAGNOSIS — I4891 Unspecified atrial fibrillation: Secondary | ICD-10-CM | POA: Insufficient documentation

## 2013-06-12 DIAGNOSIS — R000 Tachycardia, unspecified: Secondary | ICD-10-CM | POA: Insufficient documentation

## 2013-06-12 DIAGNOSIS — I359 Nonrheumatic aortic valve disorder, unspecified: Secondary | ICD-10-CM | POA: Diagnosis not present

## 2013-06-12 NOTE — Progress Notes (Signed)
*  PRELIMINARY RESULTS* Echocardiogram 2D Echocardiogram has been performed.  [place], [employer] 06/12/2013, 2:42 PM

## 2013-06-14 ENCOUNTER — Encounter: Payer: Self-pay | Admitting: Family Medicine

## 2013-06-14 ENCOUNTER — Ambulatory Visit (INDEPENDENT_AMBULATORY_CARE_PROVIDER_SITE_OTHER): Payer: Medicare Other | Admitting: Family Medicine

## 2013-06-14 VITALS — BP 112/74 | Ht 70.0 in | Wt 165.0 lb

## 2013-06-14 DIAGNOSIS — I4891 Unspecified atrial fibrillation: Secondary | ICD-10-CM | POA: Diagnosis not present

## 2013-06-14 NOTE — Progress Notes (Signed)
   Subjective:    Patient ID: Ethan Faulkner, male    DOB: 1916-02-28, 80 y.o.   MRN: [id number]  HPIFollow up on ECHO test results. Ejection fraction looks good he does have dilation and atrium which goes along with atrial fibrillation there is some calcification of the bowels but no sign of any type thrombus or clot. No significant insufficiency. He denies any chest tightness pressure pain shortness breath nausea vomiting fevers or chills  Left ear pain off and on for awhile.     Review of Systems See above    Objective:   Physical Exam  Lungs are clear heart irregular rate is controlled extremities no edema skin warm dry neurologic grossly normal      Assessment & Plan:  #1 atrial fibrillation rate is controlled blood pressure is good I still feel this patient needs to see cardiology. The main question is whether or not he needs to be on other anticoagulant. He is scared about going on Coumadin because his wife who died was on Coumadin. Possibly Xarelto is a reasonable choice. This gentleman is 80 years old and is at greater risk of bleeds.  We will follow this gentleman up again in approximately 2-3 months

## 2013-06-22 ENCOUNTER — Encounter: Payer: Self-pay | Admitting: Cardiovascular Disease

## 2013-06-22 ENCOUNTER — Ambulatory Visit (INDEPENDENT_AMBULATORY_CARE_PROVIDER_SITE_OTHER): Payer: Medicare Other | Admitting: Cardiovascular Disease

## 2013-06-22 VITALS — BP 116/80 | HR 78 | Ht 70.0 in | Wt 163.0 lb

## 2013-06-22 DIAGNOSIS — I4891 Unspecified atrial fibrillation: Secondary | ICD-10-CM

## 2013-06-22 NOTE — Progress Notes (Signed)
Patient ID: MEET WEATHINGTON, male   DOB: 09/26/1915, 80 y.o.   MRN: [id number]       CARDIOLOGY CONSULT NOTE  Patient ID: TADASHI BURKEL MRN: [id number] DOB/AGE: 07/22/1915 80 y.o.  Admit date: (Not on file) Primary Physician Sallee Lange, MD  Reason for Consultation: atrial fibrillation  HPI: The patient is a 80 year old male for the management of atrial fibrillation. Review of recent blood work shows BUN 26, creatinine 1.02, TSH mildly elevated at 4.8. A recent echocardiogram showed normal left ventricular systolic function, EF 52-84%, mild to moderate LVH, mild to moderate aortic regurgitation, severe left atrial enlargement, mild right atrial and ventricular enlargement. An ECG performed today shows atrial fibrillation at a rate of 92 beats per minute. The patient was diagnosed with atrial fibrillation within the past 2-3 weeks. He feels very well and denies chest pain, shortness of breath, palpitations, dizziness, lightheadedness, orthopnea, paroxysmal nocturnal dyspnea, and leg swelling. He denies every having had any bleeding problems, specifically with hematuria, hematochezia, or melena. He is here with his daughter, [name], who has several questions regarding the risk-benefit ratio of more potent anticoagulants.  Soc: He has 7 children. He used to live in [place], [place]. He worked for the [employer] for 36 years. 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    Allergies  Allergen Reactions  . Xanax [Alprazolam]     Dizzy, Sedated    Current Outpatient Prescriptions  Medication Sig Dispense Refill  . aspirin 81 MG tablet Take 81 mg by mouth daily.      . metoprolol succinate (TOPROL XL) 25 MG 24 hr tablet Take 1 tablet (25 mg total) by mouth daily.  30 tablet  0  . polyethylene glycol powder (GLYCOLAX/MIRALAX) powder MIX 1 CAPFUL IN 8 OZ OF WATER AND DRINK DAILY.  527 g  2   No current facility-administered medications  for this visit.    Past Medical History  Diagnosis Date  . Prostate cancer     14 years ago    Past Surgical History  Procedure Laterality Date  . Colonoscopy  1999    History   Social History  . Marital Status: Widowed    Spouse Name: N/A    Number of Children: N/A  . Years of Education: N/A   Occupational History  . Not on file.   Social History Main Topics  . Smoking status: Never Smoker   . Smokeless tobacco: Not on file  . Alcohol Use: Not on file  . Drug Use: Not on file  . Sexual Activity: Not on file   Other Topics Concern  . Not on file   Social History Narrative  . No narrative on file     No family history on file.   Prior to Admission medications   Medication Sig Start Date End Date Taking? Authorizing Provider  aspirin 81 MG tablet Take 81 mg by mouth daily.   Yes Historical Provider, MD  metoprolol succinate (TOPROL XL) 25 MG 24 hr tablet Take 1 tablet (25 mg total) by mouth daily. 06/07/13 06/07/14 Yes Scott A Luking, MD  polyethylene glycol powder (GLYCOLAX/MIRALAX) powder MIX 1 CAPFUL IN 8 OZ OF WATER AND DRINK DAILY. 04/06/13  Yes Kathyrn Drown, MD     Review of systems complete and found to be negative unless listed above in HPI     Physical exam Blood pressure 116/80, pulse 78, height 5\' 10"  (1.778 m), weight 163 lb (  73.936 kg). General: NAD Neck: No JVD, no thyromegaly or thyroid nodule.  Lungs: Clear to auscultation bilaterally with normal respiratory effort. CV: Nondisplaced PMI.  Heart irregular, normal rate, normal S1/S2, no S3, II/IV holodiastolic murmur at lower left sternal border.  No peripheral edema.  No carotid bruit.  Normal pedal pulses.  Abdomen: Soft, nontender, no hepatosplenomegaly, no distention.  Skin: Intact without lesions or rashes.  Neurologic: Alert and oriented x 3.  Psych: Normal affect. Extremities: No clubbing or cyanosis.  HEENT: Normal.   Labs:   Lab Results  Component Value Date   WBC 4.6  02/28/2013   HGB 14.8 02/28/2013   HCT 43.1 02/28/2013   MCV 89.2 02/28/2013   PLT 202 02/28/2013   No results found for this basename: NA, K, CL, CO2, BUN, CREATININE, CALCIUM, LABALBU, PROT, BILITOT, ALKPHOS, ALT, AST, GLUCOSE,  in the last 168 hours No results found for this basename: CKTOTAL, CKMB, CKMBINDEX, TROPONINI    No results found for this basename: CHOL   No results found for this basename: HDL   No results found for this basename: LDLCALC   No results found for this basename: TRIG   No results found for this basename: CHOLHDL   No results found for this basename: LDLDIRECT         Studies: - Left ventricle: The cavity size was normal. Wall thickness was increased increased in a pattern of mild to moderate LVH. Systolic function was normal. The estimated ejection fraction was in the range of 55% to 60%. Wall motion was normal; there were no regional wall motion abnormalities. - Aortic valve: Mildly calcified annulus. Mildly thickened leaflets. Mild to moderate regurgitation. Valve area: 2.33cm^2(VTI). Valve area: 2.01cm^2 (Vmax). - Left atrium: The atrium was severely dilated. - Right ventricle: The cavity size was mildly dilated. - Right atrium: The atrium was mildly dilated. - Pulmonary arteries: Systolic pressure was moderately increased. PA peak pressure: 76mm Hg (S).   ASSESSMENT AND PLAN:  1. Atrial fibrillation: I had a long discussion with both the patient and his daughter regarding the risks and benefits of being on an anticoagulant for the prevention of stroke. I specifically stated that the risk of stroke with atrial fibrillation is approximately 6% per year, and with the use of potent anticoagulants, this risk is reduced to less than 1% per year. I did also explain to them that the most current data showed that aspirin was indeed inferior to the newer anticoagulants as well as warfarin with respect to stroke risk reduction. His rate is controlled on  Toprol-XL 25 mg daily, and he is asymptomatic.  Both the patient and his daughter appreciated the thorough discussion we had, and they will speak further with Dr. Wolfgang Phoenix before they make any final decisions. I am in support of whichever decision they make.  Dispo: f/u prn.  Signed: Kate Sable, M.D., F.A.C.C.  06/22/2013, 1:50 PM

## 2013-06-22 NOTE — Patient Instructions (Signed)
Your physician recommends that you schedule a follow-up appointment if needed    Thanks for choosing [facility] !

## 2013-07-03 ENCOUNTER — Encounter: Payer: Self-pay | Admitting: Family Medicine

## 2013-07-06 ENCOUNTER — Telehealth: Payer: Self-pay | Admitting: Family Medicine

## 2013-07-06 MED ORDER — METOPROLOL SUCCINATE ER 25 MG PO TB24: 25.0000 mg | ORAL_TABLET | Freq: Every day | ORAL | Status: DC

## 2013-07-06 NOTE — Telephone Encounter (Signed)
Need refill on metoprolol succinate (TOPROL XL) 25 MG 24 hr tablet sent to [facility], please call when done

## 2013-07-06 NOTE — Telephone Encounter (Signed)
Rx sent electronically to [facility]. Patient notified 

## 2013-07-12 ENCOUNTER — Encounter: Payer: Self-pay | Admitting: Family Medicine

## 2013-07-12 ENCOUNTER — Ambulatory Visit (INDEPENDENT_AMBULATORY_CARE_PROVIDER_SITE_OTHER): Payer: Medicare Other | Admitting: Family Medicine

## 2013-07-12 VITALS — BP 122/68 | Ht 70.0 in | Wt 164.0 lb

## 2013-07-12 DIAGNOSIS — I4891 Unspecified atrial fibrillation: Secondary | ICD-10-CM

## 2013-07-12 MED ORDER — APIXABAN 5 MG PO TABS: 5.0000 mg | ORAL_TABLET | Freq: Two times a day (BID) | ORAL | Status: DC

## 2013-07-12 NOTE — Progress Notes (Signed)
   Subjective:    Patient ID: Ethan Faulkner, male    DOB: 09-Sep-1915, 80 y.o.   MRN: [id number]  HPI A fib. Follow up from cardiologist visit. Wants to discuss options.  This patient has atrial fib. HisCHAD score is elevated. He is at risk for strokes. He denies any recent chest tightness shortness of breath nausea vomiting diarrhea. States his energy level is fair he denies any other problems currently  Cardiology note was reviewed as well as echo   Review of Systems  Constitutional: Negative for activity change, appetite change and fatigue.  HENT: Negative for congestion.   Respiratory: Negative for cough and shortness of breath.   Cardiovascular: Negative for chest pain and palpitations.  Gastrointestinal: Negative for abdominal pain.  Endocrine: Negative for polydipsia.  Neurological: Negative for weakness.  Psychiatric/Behavioral: Negative for confusion.       Objective:   Physical Exam  Vitals reviewed. Constitutional: He appears well-nourished. No distress.  HENT:  Head: Normocephalic and atraumatic.  Neck: No tracheal deviation present. No thyromegaly present.  Cardiovascular: Normal rate and normal heart sounds.   No murmur heard. Pulmonary/Chest: Effort normal and breath sounds normal. No respiratory distress.  Musculoskeletal: He exhibits no edema.  Lymphadenopathy:    He has no cervical adenopathy.  Neurological: He is alert.  Psychiatric: His behavior is normal.          Assessment & Plan:  atrial fibrillation rate is under good control long discussion held with the patient regarding risk of stroke. Patient was told about increased risk of stroke without treatment. He was also informed that aspirin is not adequate treatment. We discussed Coumadin as well as newer anticoagulants. He was also discussed cost versus convenience. Also side effects with all anticoagulants of risk of fatal hemorrhage with intercranial bleed of the brain plus intestinal bleeding.  Warning signs of these were discussed. The risk of severe bleed approximately 1%. After long discussion patient agrees to go ahead and start medication. Daughters were with the patient and agreed with treatment. Further questions and discussions were entertained. We will see the patient back in 4 weeks' time.Eloquis 5 mg twice a day was started. Patient is of advanced age but otherwise good health. Kidney function recently good.  Risk-benefit ratios were discussed. It was also discussed how many things in life have benefits and risk and we just have to wait he is out and decide what is best. In his situation not treating his greater risk than it is to treat. 50 minutes spent with patient.

## 2013-08-09 ENCOUNTER — Ambulatory Visit (INDEPENDENT_AMBULATORY_CARE_PROVIDER_SITE_OTHER): Payer: Medicare Other | Admitting: Family Medicine

## 2013-08-09 ENCOUNTER — Encounter: Payer: Self-pay | Admitting: Family Medicine

## 2013-08-09 VITALS — BP 124/70 | Ht 70.0 in | Wt 161.2 lb

## 2013-08-09 DIAGNOSIS — E039 Hypothyroidism, unspecified: Secondary | ICD-10-CM | POA: Diagnosis not present

## 2013-08-09 DIAGNOSIS — Z0189 Encounter for other specified special examinations: Secondary | ICD-10-CM | POA: Diagnosis not present

## 2013-08-09 NOTE — Patient Instructions (Signed)
To Do List:  1- May use store brand Allegra 180 mg  OR claritin ( Loratadine) for allergies  2- do your labs in 1 month   3- office visit in 3 months  4- front will schedule ear irrigation for both ears

## 2013-08-09 NOTE — Progress Notes (Signed)
   Subjective:    Patient ID: TAVIOUS GRIESINGER, male    DOB: May 01, 1916, 80 y.o.   MRN: [id number]  Atrial Fibrillation Presents for follow-up visit. The symptoms have been stable. Past medical history includes atrial fibrillation. There are no medication compliance problems.  Patient would like to know what allergy medication he can take with his current medications to help with his allergy symptoms. Also would like ears checked.     Review of Systems He denies any chest tightness pressure pain shortness of breath.    Objective:   Physical Exam  Heart irregular rate is controlled pulses normal skin warm dry neurologic grossly normal  Cerumen impaction bilateral    Assessment & Plan:  Atrial fibrillation-rate is controlled. Continue current medication no sign of any bleeding. Once again a when overall the bleeding risk for this medicine. Currently the benefit of taking the medicine outweighs the risk. Patient and family aware of this.  Allergic rhinitis loratadine or Allegra is fine  Patient will followup ear irrigation  Lab work in the near future including thyroid and lipid

## 2013-08-21 ENCOUNTER — Ambulatory Visit (INDEPENDENT_AMBULATORY_CARE_PROVIDER_SITE_OTHER): Payer: Medicare Other | Admitting: *Deleted

## 2013-08-21 ENCOUNTER — Telehealth: Payer: Self-pay

## 2013-08-21 DIAGNOSIS — H6120 Impacted cerumen, unspecified ear: Secondary | ICD-10-CM

## 2013-08-21 NOTE — Telephone Encounter (Signed)
[name](daughter) needs a prescription for a laxative/stool softener for patient. States that this will help with out of pocket expense through [facility].

## 2013-08-21 NOTE — Telephone Encounter (Signed)
miralax one scoop daily prn constip ref prn

## 2013-08-21 NOTE — Telephone Encounter (Signed)
Discussed with daughter 

## 2013-08-21 NOTE — Progress Notes (Signed)
   Subjective:    Patient ID: Ethan Faulkner, male    DOB: October 31, 1915, 80 y.o.   MRN: [id number]  HPI    Review of Systems     Objective:   Physical Exam        Assessment & Plan:

## 2013-09-12 ENCOUNTER — Ambulatory Visit: Payer: Medicare Other | Admitting: Family Medicine

## 2013-09-28 ENCOUNTER — Ambulatory Visit: Payer: Medicare Other | Admitting: Family Medicine

## 2013-09-28 DIAGNOSIS — Z0189 Encounter for other specified special examinations: Secondary | ICD-10-CM | POA: Diagnosis not present

## 2013-09-28 DIAGNOSIS — E039 Hypothyroidism, unspecified: Secondary | ICD-10-CM | POA: Diagnosis not present

## 2013-09-28 LAB — LIPID PANEL
Cholesterol: 180 mg/dL (ref 0–200)
HDL: 42 mg/dL (ref >39)
LDL Cholesterol: 102 mg/dL — ABNORMAL HIGH (ref 0–99)
TRIGLYCERIDES: 182 mg/dL — AB (ref <150)
Total CHOL/HDL Ratio: 4.3 Ratio
VLDL: 36 mg/dL (ref 0–40)

## 2013-09-29 LAB — TSH: TSH: 4.925 u[IU]/mL — ABNORMAL HIGH (ref 0.350–4.500)

## 2013-11-09 ENCOUNTER — Ambulatory Visit (INDEPENDENT_AMBULATORY_CARE_PROVIDER_SITE_OTHER): Payer: Medicare Other | Admitting: Family Medicine

## 2013-11-09 ENCOUNTER — Encounter: Payer: Self-pay | Admitting: Family Medicine

## 2013-11-09 VITALS — BP 116/70 | Ht 70.0 in | Wt 160.8 lb

## 2013-11-09 DIAGNOSIS — I4819 Other persistent atrial fibrillation: Secondary | ICD-10-CM

## 2013-11-09 DIAGNOSIS — I4891 Unspecified atrial fibrillation: Secondary | ICD-10-CM

## 2013-11-09 MED ORDER — APIXABAN 5 MG PO TABS: 5.0000 mg | ORAL_TABLET | Freq: Two times a day (BID) | ORAL | Status: DC

## 2013-11-09 MED ORDER — METOPROLOL SUCCINATE ER 25 MG PO TB24: 25.0000 mg | ORAL_TABLET | Freq: Every day | ORAL | Status: DC

## 2013-11-09 NOTE — Progress Notes (Signed)
   Subjective:    Patient ID: Ethan Faulkner, male    DOB: 09-21-1915, 80 y.o.   MRN: [id number]  Atrial Fibrillation Presents for follow-up visit. Symptoms are negative for chest pain and weakness. The symptoms have been stable. Treatments tried: eliquis and metoprolol. Compliance with prior treatments has been good. Past medical history includes atrial fibrillation.   PMH benign feels good energy level good   Review of Systems  Constitutional: Negative for activity change, appetite change and fatigue.  HENT: Negative for congestion.   Respiratory: Negative for cough.   Cardiovascular: Negative for chest pain.  Gastrointestinal: Negative for abdominal pain.  Endocrine: Negative for polydipsia and polyphagia.  Neurological: Negative for weakness.  Psychiatric/Behavioral: Negative for confusion.       Objective:   Physical Exam  Vitals reviewed. Constitutional: He appears well-nourished. No distress.  HENT:  Red lesion outer left eyebrow  Cardiovascular: Normal rate and normal heart sounds.   No murmur heard. Pulmonary/Chest: Effort normal and breath sounds normal. No respiratory distress.  Musculoskeletal: He exhibits no edema.  Lymphadenopathy:    He has no cervical adenopathy.  Neurological: He is alert.  Psychiatric: His behavior is normal.          Assessment & Plan:  Check TSH in fall-subclinical hypothyroidism no need for medication currently  Continue current care  A fib stable-overall doing fine we'll recheck the patient again in 4 months-he was explained to what warning signs to watch for regarding bleeding from medication  No bleeding issues see above

## 2013-11-21 ENCOUNTER — Other Ambulatory Visit: Payer: Self-pay | Admitting: Family Medicine

## 2014-01-25 ENCOUNTER — Ambulatory Visit (INDEPENDENT_AMBULATORY_CARE_PROVIDER_SITE_OTHER): Payer: Medicare Other | Admitting: Family Medicine

## 2014-01-25 ENCOUNTER — Encounter: Payer: Self-pay | Admitting: Family Medicine

## 2014-01-25 VITALS — BP 134/80 | Temp 98.0°F | Ht 70.0 in | Wt 159.5 lb

## 2014-01-25 DIAGNOSIS — R300 Dysuria: Secondary | ICD-10-CM

## 2014-01-25 DIAGNOSIS — N390 Urinary tract infection, site not specified: Secondary | ICD-10-CM | POA: Diagnosis not present

## 2014-01-25 LAB — POCT URINALYSIS DIPSTICK
RBC UA: 10
pH, UA: 7

## 2014-01-25 MED ORDER — CIPROFLOXACIN HCL 500 MG PO TABS: 500.0000 mg | ORAL_TABLET | Freq: Two times a day (BID) | ORAL | Status: DC

## 2014-01-25 NOTE — Progress Notes (Signed)
   Subjective:    Patient ID: Ethan Faulkner, male    DOB: 09/04/1915, 80 y.o.   MRN: [id number]  Dysuria  This is a new problem. The current episode started in the past 7 days. The problem has been unchanged. The quality of the pain is described as burning. The pain is moderate. There has been no fever. Associated symptoms include frequency. He has tried nothing for the symptoms. The treatment provided no relief.   Patient states he has left ear pain that started over the weekend. He would like the doctor to take a look at it.   Patient gives a history of prostate cancer with local seeding treatment many years ago. Overall has done well since. No history of major urinary involvement.  No fever or chills.  Positive increase frequency and dysuria. Review of Systems  Genitourinary: Positive for dysuria and frequency.   No back pain no domino pain no headache ROS otherwise negative    Objective:   Physical Exam Alert no apparent distress. Lungs clear. Heart regular in rhythm. Prostate no palpable abnormality. Abdomen benign.  Urine 2-4 white blood cells per high-power field       Assessment & Plan:  Impression urinary tract infection with potential prostate involvement plan Cipro twice a day 21 days. Followup with Dr. Nicki Reaper in 4 weeks. WSL

## 2014-02-14 ENCOUNTER — Ambulatory Visit (INDEPENDENT_AMBULATORY_CARE_PROVIDER_SITE_OTHER): Payer: Medicare Other | Admitting: *Deleted

## 2014-02-14 DIAGNOSIS — H6120 Impacted cerumen, unspecified ear: Secondary | ICD-10-CM

## 2014-02-14 DIAGNOSIS — H6123 Impacted cerumen, bilateral: Secondary | ICD-10-CM

## 2014-02-14 NOTE — Progress Notes (Signed)
Patient ID: Ethan Faulkner, male   DOB: [birth year], 80 y.o.   MRN: [id number] Bilateral ear irrigation without difficulty.

## 2014-02-26 ENCOUNTER — Ambulatory Visit: Payer: Medicare Other | Admitting: Family Medicine

## 2014-03-01 ENCOUNTER — Encounter: Payer: Self-pay | Admitting: Family Medicine

## 2014-03-01 ENCOUNTER — Ambulatory Visit (INDEPENDENT_AMBULATORY_CARE_PROVIDER_SITE_OTHER): Payer: Medicare Other | Admitting: Family Medicine

## 2014-03-01 VITALS — BP 128/82 | Ht 70.0 in | Wt 163.0 lb

## 2014-03-01 DIAGNOSIS — Z23 Encounter for immunization: Secondary | ICD-10-CM

## 2014-03-01 DIAGNOSIS — Z7901 Long term (current) use of anticoagulants: Secondary | ICD-10-CM

## 2014-03-01 DIAGNOSIS — E038 Other specified hypothyroidism: Secondary | ICD-10-CM | POA: Diagnosis not present

## 2014-03-01 DIAGNOSIS — I481 Persistent atrial fibrillation: Secondary | ICD-10-CM

## 2014-03-01 DIAGNOSIS — E039 Hypothyroidism, unspecified: Secondary | ICD-10-CM

## 2014-03-01 DIAGNOSIS — I4819 Other persistent atrial fibrillation: Secondary | ICD-10-CM

## 2014-03-01 NOTE — Progress Notes (Signed)
   Subjective:    Patient ID: Ethan Faulkner, male    DOB: Sep 08, 1915, 80 y.o.   MRN: [id number]  HPI Patient is here today for a follow up. Patient last visit was for a ear irrigation on 9/23. Patient wants a Flu shot.  Cognitively doing good. Patient denies any particular problems. Takes his medicine as directed denies any bleeding issues Review of Systems Denies chest tightness pressure pain nausea vomiting diarrhea relates some fatigue denies snoring denies falls    Objective:   Physical Exam Heart rate control irregular. Lungs clear no crackles extremities no edema abdomen soft neck no masses      Assessment & Plan:  Patient on anticoagulants check CBC As history subclinical hypothyroidism check TSH Atrial fibrillation heart rate under good control blood pressure good continuing medications.

## 2014-03-27 ENCOUNTER — Ambulatory Visit: Payer: Medicare Other | Admitting: Family Medicine

## 2014-03-28 DIAGNOSIS — Z7901 Long term (current) use of anticoagulants: Secondary | ICD-10-CM | POA: Diagnosis not present

## 2014-03-28 DIAGNOSIS — E038 Other specified hypothyroidism: Secondary | ICD-10-CM | POA: Diagnosis not present

## 2014-03-28 LAB — CBC WITH DIFFERENTIAL/PLATELET
BASOS PCT: 0 % (ref 0–1)
Basophils Absolute: 0 10*3/uL (ref 0.0–0.1)
Eosinophils Absolute: 0.1 10*3/uL (ref 0.0–0.7)
Eosinophils Relative: 2 % (ref 0–5)
HCT: 44.3 % (ref 39.0–52.0)
HEMOGLOBIN: 14.7 g/dL (ref 13.0–17.0)
LYMPHS ABS: 1.7 10*3/uL (ref 0.7–4.0)
Lymphocytes Relative: 40 % (ref 12–46)
MCH: 30.4 pg (ref 26.0–34.0)
MCHC: 33.2 g/dL (ref 30.0–36.0)
MCV: 91.7 fL (ref 78.0–100.0)
MONO ABS: 0.5 10*3/uL (ref 0.1–1.0)
Monocytes Relative: 13 % — ABNORMAL HIGH (ref 3–12)
NEUTROS ABS: 1.9 10*3/uL (ref 1.7–7.7)
Neutrophils Relative %: 45 % (ref 43–77)
Platelets: 171 10*3/uL (ref 150–400)
RBC: 4.83 MIL/uL (ref 4.22–5.81)
RDW: 14.1 % (ref 11.5–15.5)
WBC: 4.2 10*3/uL (ref 4.0–10.5)

## 2014-03-28 LAB — TSH: TSH: 5.521 u[IU]/mL — AB (ref 0.350–4.500)

## 2014-03-29 NOTE — Progress Notes (Signed)
Patient notified and verbalized understanding of test results. No further questions. Patient's daughter will be calling to schedule f/u appt.

## 2014-04-11 ENCOUNTER — Encounter: Payer: Self-pay | Admitting: Family Medicine

## 2014-04-11 ENCOUNTER — Ambulatory Visit (INDEPENDENT_AMBULATORY_CARE_PROVIDER_SITE_OTHER): Payer: Medicare Other | Admitting: Family Medicine

## 2014-04-11 VITALS — BP 110/70 | Ht 70.0 in | Wt 162.0 lb

## 2014-04-11 DIAGNOSIS — E038 Other specified hypothyroidism: Secondary | ICD-10-CM

## 2014-04-11 DIAGNOSIS — E039 Hypothyroidism, unspecified: Secondary | ICD-10-CM

## 2014-04-11 DIAGNOSIS — I481 Persistent atrial fibrillation: Secondary | ICD-10-CM

## 2014-04-11 DIAGNOSIS — I4819 Other persistent atrial fibrillation: Secondary | ICD-10-CM

## 2014-04-11 NOTE — Progress Notes (Signed)
   Subjective:    Patient ID: Ethan Faulkner, male    DOB: [birth year], 81 y.o.   MRN: [id number]  HPI Patient is here today for a f/u from 03/01/14 .  He had BW done.   No new concerns.   Energy levels fairly good. Tries to do some walking tries to eat healthy lab work reviewed in detail 15 minutes spent with patient  Review of Systems Denies fast heart rate   denies chest tightness pressure pain constipation Objective:   Physical Exam Lungs clear heart irregular but rate controlled neck no masses no thyroid mass       Assessment & Plan:  Atrial fib rate controlled continue current meds new no sign of bleeding  Subclinical hypothyroidism no medication indicated currently check TSH and free T4 in the spring with follow-up

## 2014-06-05 ENCOUNTER — Other Ambulatory Visit: Payer: Self-pay | Admitting: Family Medicine

## 2014-07-05 ENCOUNTER — Other Ambulatory Visit: Payer: Self-pay | Admitting: Family Medicine

## 2014-08-20 ENCOUNTER — Telehealth: Payer: Self-pay | Admitting: *Deleted

## 2014-08-20 MED ORDER — CIPROFLOXACIN HCL 500 MG PO TABS: 500.0000 mg | ORAL_TABLET | Freq: Two times a day (BID) | ORAL | Status: DC

## 2014-08-20 NOTE — Telephone Encounter (Signed)
Pt having burning when urinating that started yesterday. Pt not having any other s/s. Warning signs discussed with daughter.   Per Dr. Nicki Reaper, Cipro for 14 days.   Call back if s/s persist. Verbalized understanding.

## 2014-08-30 ENCOUNTER — Telehealth: Payer: Self-pay | Admitting: Family Medicine

## 2014-08-30 NOTE — Telephone Encounter (Signed)
pts daughter called stating the patient has noticed the spot on his face that she  Had you look at last time without him really knowing you were looking. He is in a Panic of this at this point and [name] wants to know if you can call him to ease his  Mind about the spot on his face. Or does he need to come in?   Thanks

## 2014-08-30 NOTE — Telephone Encounter (Signed)
I believe it is in the patient's best interest for [place] to go ahead and see him. We can see him today or Monday whatever works best for you

## 2014-08-30 NOTE — Telephone Encounter (Signed)
[facility] number to [phone]

## 2014-08-31 NOTE — Telephone Encounter (Signed)
[phone]   Did you try this number to reach them?

## 2014-08-31 NOTE — Telephone Encounter (Signed)
Page 4/8

## 2014-09-10 ENCOUNTER — Encounter: Payer: Self-pay | Admitting: Family Medicine

## 2014-09-10 ENCOUNTER — Ambulatory Visit (INDEPENDENT_AMBULATORY_CARE_PROVIDER_SITE_OTHER): Payer: Medicare Other | Admitting: Family Medicine

## 2014-09-10 VITALS — BP 108/64 | Temp 98.0°F | Ht 70.0 in | Wt 163.0 lb

## 2014-09-10 DIAGNOSIS — E038 Other specified hypothyroidism: Secondary | ICD-10-CM

## 2014-09-10 DIAGNOSIS — R8299 Other abnormal findings in urine: Secondary | ICD-10-CM

## 2014-09-10 DIAGNOSIS — Z79899 Other long term (current) drug therapy: Secondary | ICD-10-CM | POA: Diagnosis not present

## 2014-09-10 DIAGNOSIS — I481 Persistent atrial fibrillation: Secondary | ICD-10-CM

## 2014-09-10 DIAGNOSIS — L989 Disorder of the skin and subcutaneous tissue, unspecified: Secondary | ICD-10-CM

## 2014-09-10 DIAGNOSIS — Z7901 Long term (current) use of anticoagulants: Secondary | ICD-10-CM

## 2014-09-10 DIAGNOSIS — I4819 Other persistent atrial fibrillation: Secondary | ICD-10-CM

## 2014-09-10 DIAGNOSIS — E039 Hypothyroidism, unspecified: Secondary | ICD-10-CM

## 2014-09-10 DIAGNOSIS — R82998 Other abnormal findings in urine: Secondary | ICD-10-CM

## 2014-09-10 LAB — POCT URINALYSIS DIPSTICK
Spec Grav, UA: 1.025
pH, UA: 5

## 2014-09-10 NOTE — Progress Notes (Signed)
   Subjective:    Patient ID: Ethan Faulkner, male    DOB: 08-14-1915, 81 y.o.   MRN: [id number]  HPIFollow up bloodwork.   Urine looks dark, low back pain, burning with urination one time. Started about 2 weeks ago. He is concerned about getting a urinary tract infection  Check rash on face. He states this area seems to be getting worse it worrisome  Pain behind both ears and pain going down neck. Headaches. Taking tylenol.  He he does relate some fatigue with activity denies shortness of breath or chest tightness or pain     Review of Systems Denies any excessive thirst urination vomiting diarrhea denies bleeding issues relates slight low back pain in concentrated urine but no dysuria    Objective:   Physical Exam Neck no masses are felt Right temporal area has a skin lesion approximately 1/2 inches in diameter that seems to be getting larger could be a developing melanoma along the edge of it Lungs are clear heart irregular rate is controlled extremities no edema pulses are normal skin warm dry       Assessment & Plan:  Urine negative culture sent Lesion on his right temporal area has a darkened area that makes me concerned about the possibility of a developing melanoma referral to dermatology Musculoskeletal pains in his neck Tylenol as needed no sign of any tumor Subclinical hypothyroidism check lab work await results Chronic anticoagulation no signs of bleeding issues continue this Atrial fibrillation causing some fatigue echo last year showed good ejection fraction continue as is

## 2014-09-10 NOTE — Progress Notes (Signed)
Handicap form completed and mailed to patient.

## 2014-09-11 LAB — BASIC METABOLIC PANEL
BUN/Creatinine Ratio: 16 (ref 10–22)
BUN: 18 mg/dL (ref 10–36)
CO2: 23 mmol/L (ref 18–29)
Calcium: 9.3 mg/dL (ref 8.6–10.2)
Chloride: 101 mmol/L (ref 97–108)
Creatinine, Ser: 1.1 mg/dL (ref 0.76–1.27)
GFR calc Af Amer: 65 mL/min/{1.73_m2} (ref >59)
GFR, EST NON AFRICAN AMERICAN: 56 mL/min/{1.73_m2} — AB (ref >59)
Glucose: 108 mg/dL — ABNORMAL HIGH (ref 65–99)
Potassium: 4.4 mmol/L (ref 3.5–5.2)
SODIUM: 141 mmol/L (ref 134–144)

## 2014-09-11 LAB — TSH: TSH: 6.23 u[IU]/mL — ABNORMAL HIGH (ref 0.450–4.500)

## 2014-09-11 LAB — HEMOGLOBIN: Hemoglobin: 14.9 g/dL (ref 12.6–17.7)

## 2014-09-12 LAB — URINE CULTURE: ORGANISM ID, BACTERIA: NO GROWTH

## 2014-09-19 MED ORDER — LEVOTHYROXINE SODIUM 25 MCG PO TABS: ORAL_TABLET | ORAL | Status: DC

## 2014-09-19 NOTE — Addendum Note (Signed)
Addended by: Dairl Ponder on: 09/19/2014 10:48 AM   Modules accepted: Orders

## 2014-09-27 DIAGNOSIS — X32XXXA Exposure to sunlight, initial encounter: Secondary | ICD-10-CM | POA: Diagnosis not present

## 2014-09-27 DIAGNOSIS — D225 Melanocytic nevi of trunk: Secondary | ICD-10-CM | POA: Diagnosis not present

## 2014-09-27 DIAGNOSIS — L821 Other seborrheic keratosis: Secondary | ICD-10-CM | POA: Diagnosis not present

## 2014-09-27 DIAGNOSIS — L570 Actinic keratosis: Secondary | ICD-10-CM | POA: Diagnosis not present

## 2014-11-27 ENCOUNTER — Other Ambulatory Visit: Payer: Self-pay | Admitting: Family Medicine

## 2014-12-05 ENCOUNTER — Other Ambulatory Visit: Payer: Self-pay | Admitting: Family Medicine

## 2014-12-28 ENCOUNTER — Other Ambulatory Visit: Payer: Self-pay | Admitting: Family Medicine

## 2015-02-13 ENCOUNTER — Encounter: Payer: Self-pay | Admitting: Family Medicine

## 2015-02-13 ENCOUNTER — Ambulatory Visit (INDEPENDENT_AMBULATORY_CARE_PROVIDER_SITE_OTHER): Payer: Medicare Other | Admitting: Family Medicine

## 2015-02-13 VITALS — Temp 98.9°F | Ht 70.0 in | Wt 160.2 lb

## 2015-02-13 DIAGNOSIS — J329 Chronic sinusitis, unspecified: Secondary | ICD-10-CM | POA: Diagnosis not present

## 2015-02-13 DIAGNOSIS — J301 Allergic rhinitis due to pollen: Secondary | ICD-10-CM

## 2015-02-13 MED ORDER — FLUTICASONE PROPIONATE 50 MCG/ACT NA SUSP: 2.0000 | Freq: Every day | NASAL | Status: DC

## 2015-02-13 MED ORDER — AMOXICILLIN 500 MG PO CAPS: 500.0000 mg | ORAL_CAPSULE | Freq: Three times a day (TID) | ORAL | Status: DC

## 2015-02-13 NOTE — Progress Notes (Signed)
   Subjective:    Patient ID: Ethan Faulkner, male    DOB: 1915-09-27, 81 y.o.   MRN: [id number]  Sinus Problem This is a new problem. The current episode started today. Associated symptoms include congestion and headaches. Past treatments include acetaminophen.   No other sickness at home woke up this morn with cetirizine  Forehead hurting and aching   Tylenol this morn and felt bad, achey diminished energy   coguh   Also frustrated by long-term allergic rhinitis with subacute response to cetirizine daily  Review of Systems  HENT: Positive for congestion.   Neurological: Positive for headaches.       Objective:   Physical Exam  Alert mild malaise. Vital stable HEENT moderate his congestion frontal tenderness. Pharynx normal neck supple. Lungs clear. Heart regular in rhythm.      Assessment & Plan:  Impression 1 acute rhinosinusitis #2 allergic rhinitis discussed plan add Flonase daily. Antibiotics prescribed. Symptomatic care discussed WSL

## 2015-02-18 ENCOUNTER — Ambulatory Visit (INDEPENDENT_AMBULATORY_CARE_PROVIDER_SITE_OTHER): Payer: Medicare Other | Admitting: Family Medicine

## 2015-02-18 VITALS — BP 130/86 | Temp 98.1°F | Ht 70.0 in | Wt 155.1 lb

## 2015-02-18 DIAGNOSIS — Z125 Encounter for screening for malignant neoplasm of prostate: Secondary | ICD-10-CM

## 2015-02-18 DIAGNOSIS — Z23 Encounter for immunization: Secondary | ICD-10-CM | POA: Diagnosis not present

## 2015-02-18 DIAGNOSIS — M545 Low back pain, unspecified: Secondary | ICD-10-CM

## 2015-02-18 DIAGNOSIS — E039 Hypothyroidism, unspecified: Secondary | ICD-10-CM

## 2015-02-18 DIAGNOSIS — E038 Other specified hypothyroidism: Secondary | ICD-10-CM | POA: Diagnosis not present

## 2015-02-18 NOTE — Progress Notes (Signed)
   Subjective:    Patient ID: AMJAD FIKES, male    DOB: April 06, 1916, 81 y.o.   MRN: [id number]  Back Pain This is a recurrent problem. The current episode started more than 1 year ago. The problem occurs intermittently. The pain is present in the lumbar spine. The quality of the pain is described as aching. The pain does not radiate. The pain is worse during the day. Treatments tried: Tylenol  The treatment provided mild relief.   Patient is was recently seen for Sinusitis and states that he is doing better but would like to follow up.  Main complaint this visit is lower back pain. Patient states low back pain is recurrent. Has had symptoms in the past. History prostate issues  Review of Systems  Musculoskeletal: Positive for back pain.   [name] [phone]    Objective:   Physical Exam  Lungs clear heart regular low back subjective discomfort negative straight leg raise abdomen soft  Patient denies any weight loss change of appetite denies nighttime pain    Assessment & Plan:  Check PSA Hypothyroidism check TSH Tylenol as needed for back pain Regular follow-up every 6 months

## 2015-02-19 LAB — PSA: Prostate Specific Ag, Serum: <0.1 ng/mL (ref 0.0–4.0)

## 2015-02-19 LAB — TSH: TSH: 6.41 u[IU]/mL — AB (ref 0.450–4.500)

## 2015-02-26 ENCOUNTER — Other Ambulatory Visit: Payer: Self-pay | Admitting: *Deleted

## 2015-02-26 DIAGNOSIS — E039 Hypothyroidism, unspecified: Secondary | ICD-10-CM

## 2015-02-26 MED ORDER — LEVOTHYROXINE SODIUM 25 MCG PO TABS: 25.0000 ug | ORAL_TABLET | Freq: Every morning | ORAL | Status: DC

## 2015-03-12 ENCOUNTER — Encounter: Payer: Self-pay | Admitting: Family Medicine

## 2015-03-12 ENCOUNTER — Ambulatory Visit (INDEPENDENT_AMBULATORY_CARE_PROVIDER_SITE_OTHER): Payer: Medicare Other | Admitting: Family Medicine

## 2015-03-12 VITALS — BP 100/76 | Ht 70.0 in | Wt 162.0 lb

## 2015-03-12 DIAGNOSIS — E038 Other specified hypothyroidism: Secondary | ICD-10-CM | POA: Diagnosis not present

## 2015-03-12 DIAGNOSIS — E039 Hypothyroidism, unspecified: Secondary | ICD-10-CM

## 2015-03-12 MED ORDER — METOPROLOL SUCCINATE ER 25 MG PO TB24: 25.0000 mg | ORAL_TABLET | Freq: Every day | ORAL | Status: DC

## 2015-03-12 MED ORDER — APIXABAN 5 MG PO TABS: ORAL_TABLET | ORAL | Status: DC

## 2015-03-12 NOTE — Progress Notes (Signed)
   Subjective:    Patient ID: Ethan Faulkner, male    DOB: 1916/03/05, 81 y.o.   MRN: [id number]   HPI Patient is here today for a follow up on hypothyroidism. Patient wants to see if he needs his ears cleaned today. Patient has no other concerns at this time.    relate some fatigue and tiredness denies any chest tightness pressure pain  Review of Systems  Constitutional: Negative for activity change, appetite change and fatigue.  HENT: Negative for congestion.   Respiratory: Negative for cough.   Cardiovascular: Negative for chest pain.  Gastrointestinal: Negative for abdominal pain.  Endocrine: Negative for polydipsia and polyphagia.  Neurological: Negative for weakness.  Psychiatric/Behavioral: Negative for confusion.       Objective:   Physical Exam  both ear canals are normal  lungs clear heart regular pulse  Irregular rate controlled extremities no edema skin warm dry      Assessment & Plan:   hypothyroidism continue medication check TSH in approximately 6 weeks May need to adjust dose otherwise follow-up in the springtime sooner if any health issues

## 2015-04-29 ENCOUNTER — Ambulatory Visit (INDEPENDENT_AMBULATORY_CARE_PROVIDER_SITE_OTHER): Payer: Medicare Other | Admitting: Family Medicine

## 2015-04-29 ENCOUNTER — Encounter: Payer: Self-pay | Admitting: Family Medicine

## 2015-04-29 VITALS — BP 120/82 | Temp 98.1°F | Ht 70.0 in | Wt 161.0 lb

## 2015-04-29 DIAGNOSIS — E039 Hypothyroidism, unspecified: Secondary | ICD-10-CM

## 2015-04-29 DIAGNOSIS — Z7901 Long term (current) use of anticoagulants: Secondary | ICD-10-CM

## 2015-04-29 DIAGNOSIS — M542 Cervicalgia: Secondary | ICD-10-CM | POA: Diagnosis not present

## 2015-04-29 DIAGNOSIS — E038 Other specified hypothyroidism: Secondary | ICD-10-CM | POA: Diagnosis not present

## 2015-04-29 MED ORDER — TRAMADOL HCL 50 MG PO TABS: 50.0000 mg | ORAL_TABLET | Freq: Three times a day (TID) | ORAL | Status: DC | PRN

## 2015-04-29 NOTE — Progress Notes (Signed)
   Subjective:    Patient ID: Ethan Faulkner, male    DOB: [birth year], 81 y.o.   MRN: [id number]  HPI  Patient arrives with c/o ear pain and dizziness for several days- feeling sluggish -no cough or congestion. Patient fatigue tired with activity get somewhat dizzy when he stands up. Denies chest tightness pressure pain but relates some ear discomfort Review of Systems    neck discomfort noted by patient in the proximal portion close to the ears denies head congestion drainage coughing chest tightness pressure or pain Objective:   Physical Exam  Neck no masses subjective discomfort in the anterior proximal portion of the neck. Lungs are clear no crackles heart is retropulsed normal extremities no edema skin warm dry  Neurologic grossly normal walking was observed no ataxia no unilateral numbness or weakness I doubt stroke  25 minutes was spent with the patient. Greater than half the time was spent in discussion and answering questions and counseling regarding the issues that the patient came in for today.     Assessment & Plan:  Bilateral neck discomfort radiates into the ears musculoskeletal Tylenol when necessary if severe tramadol cautioned drowsiness  Fatigue tiredness history of subclinical hypothyroidism check thyroid function  Prolonged anticoagulant use along with fatigue check CBC make sure patient not getting anemic.  Follow-up in the spring

## 2015-04-30 LAB — CBC WITH DIFFERENTIAL/PLATELET
BASOS ABS: 0 10*3/uL (ref 0.0–0.2)
Basos: 1 %
EOS (ABSOLUTE): 0.1 10*3/uL (ref 0.0–0.4)
Eos: 3 %
Hematocrit: 41.5 % (ref 37.5–51.0)
Hemoglobin: 14.6 g/dL (ref 12.6–17.7)
Immature Grans (Abs): 0 10*3/uL (ref 0.0–0.1)
Immature Granulocytes: 0 %
LYMPHS ABS: 0.7 10*3/uL (ref 0.7–3.1)
Lymphs: 20 %
MCH: 30.9 pg (ref 26.6–33.0)
MCHC: 35.2 g/dL (ref 31.5–35.7)
MCV: 88 fL (ref 79–97)
MONOS ABS: 0.4 10*3/uL (ref 0.1–0.9)
Monocytes: 11 %
NEUTROS ABS: 2.2 10*3/uL (ref 1.4–7.0)
Neutrophils: 65 %
PLATELETS: 107 10*3/uL — AB (ref 150–379)
RBC: 4.72 x10E6/uL (ref 4.14–5.80)
RDW: 13.9 % (ref 12.3–15.4)
WBC: 3.4 10*3/uL (ref 3.4–10.8)

## 2015-04-30 LAB — TSH: TSH: 3.77 u[IU]/mL (ref 0.450–4.500)

## 2015-05-25 DIAGNOSIS — N3001 Acute cystitis with hematuria: Secondary | ICD-10-CM | POA: Diagnosis not present

## 2015-05-27 ENCOUNTER — Other Ambulatory Visit: Payer: Self-pay | Admitting: Family Medicine

## 2015-05-28 ENCOUNTER — Encounter: Payer: Self-pay | Admitting: Family Medicine

## 2015-05-28 ENCOUNTER — Ambulatory Visit (INDEPENDENT_AMBULATORY_CARE_PROVIDER_SITE_OTHER): Payer: Medicare Other | Admitting: Family Medicine

## 2015-05-28 VITALS — BP 138/84 | Ht 70.0 in | Wt 158.0 lb

## 2015-05-28 DIAGNOSIS — R319 Hematuria, unspecified: Secondary | ICD-10-CM | POA: Diagnosis not present

## 2015-05-28 LAB — POCT URINALYSIS DIPSTICK
PH UA: 6
SPEC GRAV UA: 1.01
UROBILINOGEN UA: 2

## 2015-05-28 NOTE — Addendum Note (Signed)
Addended by: Sallee Lange A on: 05/28/2015 08:01 PM   Modules accepted: Level of Service

## 2015-05-28 NOTE — Progress Notes (Addendum)
   Subjective:    Patient ID: Ethan Faulkner, male    DOB: 06/22/1915, 82 y.o.   MRN: [id number]  HPIwent to urgent care on Saturday. Was having low back pain and blood in urine. Taking nitrofurantoin 100mg  one bid. Feeling better.  Patient with 2 different episodes over the weekend where he urinated one time it was dark brown another time reddish he did have a slight back pain denies dysuria denies frequency. Denies fever chills relates energy level doing okay appetite doing okay has a history of atrial fibrillation is on anticoagulant in addition to this he also has remote history of prostate cancer although PSA was normal earlier this year he denies any high fever chills sweats denies dysuria denies coughing congestion shortness of breath appetite is been good no fevers. No vomiting or diarrhea. Denies history kidney stones.   Review of Systems Denies headaches no difficulty swallowing no difficulty breathing denies palpitations denies nausea vomiting diarrhea denies joint pain denies rash positive for hematuria    Objective:   Physical Exam Neck no masses heart rate is controlled your regular lungs clear abdomen soft no masses extremities no edema skin warm dry urinalysis negative for RBCs       Assessment & Plan:  Hematuria-finish antibiotics-patient does need to have CT scan to rule out kidney mass. I recommend follow-up. At this present time I don't recommend cystoscope. It is possible that this could be related to being on anticoagulant. It is also possible that there could be bladder cancer but hold off on urology consult until CT scan is completed patient will follow-up.  He was told if he starts having gross hematuria again that does not stop he needs to stop taking the anticoagulant.  25 minutes was spent with the patient. Greater than half the time was spent in discussion and answering questions and counseling regarding the issues that the patient came in for today.  Hold off  on doing lab work currently

## 2015-06-05 ENCOUNTER — Ambulatory Visit (HOSPITAL_COMMUNITY)
Admission: RE | Admit: 2015-06-05 | Discharge: 2015-06-05 | Disposition: A | Discharge status: Home / Self Care | Payer: Medicare Other | Source: Ambulatory Visit | Attending: Family Medicine | Admitting: Family Medicine

## 2015-06-05 DIAGNOSIS — R319 Hematuria, unspecified: Secondary | ICD-10-CM | POA: Diagnosis not present

## 2015-06-05 DIAGNOSIS — R109 Unspecified abdominal pain: Secondary | ICD-10-CM | POA: Insufficient documentation

## 2015-06-05 DIAGNOSIS — Z8546 Personal history of malignant neoplasm of prostate: Secondary | ICD-10-CM | POA: Diagnosis not present

## 2015-06-05 DIAGNOSIS — N2889 Other specified disorders of kidney and ureter: Secondary | ICD-10-CM | POA: Diagnosis not present

## 2015-06-06 ENCOUNTER — Ambulatory Visit (INDEPENDENT_AMBULATORY_CARE_PROVIDER_SITE_OTHER): Payer: Medicare Other | Admitting: Family Medicine

## 2015-06-06 ENCOUNTER — Encounter: Payer: Self-pay | Admitting: Family Medicine

## 2015-06-06 VITALS — BP 130/80 | Ht 70.0 in | Wt 160.0 lb

## 2015-06-06 DIAGNOSIS — C642 Malignant neoplasm of left kidney, except renal pelvis: Secondary | ICD-10-CM

## 2015-06-06 DIAGNOSIS — C649 Malignant neoplasm of unspecified kidney, except renal pelvis: Secondary | ICD-10-CM | POA: Insufficient documentation

## 2015-06-06 NOTE — Progress Notes (Signed)
   Subjective:    Patient ID: Ethan Faulkner, male    DOB: 1915-12-17, 82 y.o.   MRN: [id number]  HPI Patient is here today to discuss recent CT scan results. Patient has no other concerns at this time.   CT scan back in 2012 showed a small growth they decided at that time not to do anything about it now this area appears to be getting bigger not causing any weight loss pain or discomfort  Review of Systems     Objective:   Physical Exam   lungs clear heart regular abdomen soft  urine negative for blood   15-20 minutes was spent with patient today discussing CAT scan results.    Assessment & Plan:   kidney growth-probable renal cell cancer- patient in family prefers not to have surgery because of his age we will monitor. I will touch base with urology to see if they recommend any different probes.

## 2015-06-16 ENCOUNTER — Telehealth: Payer: Self-pay | Admitting: Family Medicine

## 2015-06-16 DIAGNOSIS — R319 Hematuria, unspecified: Secondary | ICD-10-CM

## 2015-06-16 NOTE — Telephone Encounter (Signed)
I did discuss the case with urology regarding this patient. Urology brings up very valid point that patient's hematuria could have came from the bladder. They recommend office visit with urology may need cystoscope which I believe is very reasonable. #1 go ahead and initiate referral to [facility] [place] office for hematuria #2 please let me speak with [name]  regarding this patient

## 2015-06-17 ENCOUNTER — Encounter: Payer: Self-pay | Admitting: Family Medicine

## 2015-06-17 NOTE — Telephone Encounter (Signed)
Referral to [facility] put into epic per [name]. Dr.Scott to speak with [name].

## 2015-06-17 NOTE — Telephone Encounter (Signed)
I discussed this with the family member.(The referral is because patient was having hematuria intermittently while on anticoagulants) Please notify [name] when this appointment is set up thank you

## 2015-06-17 NOTE — Addendum Note (Signed)
Addended by: Launa Grill on: 06/17/2015 08:26 AM   Modules accepted: Orders

## 2015-06-17 NOTE — Telephone Encounter (Signed)
Dr.Scott to speak with [name]

## 2015-07-04 ENCOUNTER — Ambulatory Visit: Payer: Medicare Other | Admitting: Family Medicine

## 2015-07-19 ENCOUNTER — Ambulatory Visit (INDEPENDENT_AMBULATORY_CARE_PROVIDER_SITE_OTHER): Payer: Medicare Other | Admitting: Urology

## 2015-07-19 DIAGNOSIS — N3040 Irradiation cystitis without hematuria: Secondary | ICD-10-CM

## 2015-07-19 DIAGNOSIS — C642 Malignant neoplasm of left kidney, except renal pelvis: Secondary | ICD-10-CM | POA: Diagnosis not present

## 2015-07-19 DIAGNOSIS — R310 Gross hematuria: Secondary | ICD-10-CM

## 2015-07-19 DIAGNOSIS — R3129 Other microscopic hematuria: Secondary | ICD-10-CM | POA: Diagnosis not present

## 2015-08-26 ENCOUNTER — Telehealth: Payer: Self-pay | Admitting: Family Medicine

## 2015-08-26 ENCOUNTER — Other Ambulatory Visit: Payer: Self-pay | Admitting: Family Medicine

## 2015-08-26 NOTE — Telephone Encounter (Signed)
Spoke with patient's daughter and informed her per [name]- Should not cause a problem. Fine to use. Patient's daughter verbalized understanding.

## 2015-08-26 NOTE — Telephone Encounter (Signed)
Pt's daughter called stating that someone told the pt that if he took cinnamon pills that it would give him energy. Pt is taking 500 mg of cinnamon and pt's daughter is wanting to know if it is ok for him to take them.

## 2015-08-26 NOTE — Telephone Encounter (Signed)
Should not cause a problem. Fine to use

## 2015-10-08 ENCOUNTER — Other Ambulatory Visit: Payer: Self-pay | Admitting: Family Medicine

## 2015-10-28 ENCOUNTER — Other Ambulatory Visit: Payer: Self-pay | Admitting: Family Medicine

## 2015-11-20 ENCOUNTER — Emergency Department (HOSPITAL_COMMUNITY): Payer: Medicare Other

## 2015-11-20 ENCOUNTER — Inpatient Hospital Stay (HOSPITAL_COMMUNITY)
Admission: EM | Admit: 2015-11-20 | Discharge: 2015-11-25 | DRG: 470 | Disposition: A | Discharge status: Skilled Nursing Facility | Payer: Medicare Other | Attending: Internal Medicine | Admitting: Internal Medicine

## 2015-11-20 ENCOUNTER — Encounter (HOSPITAL_COMMUNITY): Payer: Self-pay | Admitting: Emergency Medicine

## 2015-11-20 DIAGNOSIS — D62 Acute posthemorrhagic anemia: Secondary | ICD-10-CM | POA: Diagnosis not present

## 2015-11-20 DIAGNOSIS — E038 Other specified hypothyroidism: Secondary | ICD-10-CM | POA: Diagnosis not present

## 2015-11-20 DIAGNOSIS — S72009A Fracture of unspecified part of neck of unspecified femur, initial encounter for closed fracture: Secondary | ICD-10-CM | POA: Diagnosis not present

## 2015-11-20 DIAGNOSIS — Z923 Personal history of irradiation: Secondary | ICD-10-CM

## 2015-11-20 DIAGNOSIS — Z01810 Encounter for preprocedural cardiovascular examination: Secondary | ICD-10-CM | POA: Diagnosis not present

## 2015-11-20 DIAGNOSIS — M6281 Muscle weakness (generalized): Secondary | ICD-10-CM | POA: Diagnosis not present

## 2015-11-20 DIAGNOSIS — S72002A Fracture of unspecified part of neck of left femur, initial encounter for closed fracture: Secondary | ICD-10-CM | POA: Diagnosis not present

## 2015-11-20 DIAGNOSIS — R110 Nausea: Secondary | ICD-10-CM | POA: Diagnosis not present

## 2015-11-20 DIAGNOSIS — M25552 Pain in left hip: Secondary | ICD-10-CM | POA: Diagnosis not present

## 2015-11-20 DIAGNOSIS — C642 Malignant neoplasm of left kidney, except renal pelvis: Secondary | ICD-10-CM | POA: Diagnosis not present

## 2015-11-20 DIAGNOSIS — Z471 Aftercare following joint replacement surgery: Secondary | ICD-10-CM | POA: Diagnosis not present

## 2015-11-20 DIAGNOSIS — S72002D Fracture of unspecified part of neck of left femur, subsequent encounter for closed fracture with routine healing: Secondary | ICD-10-CM | POA: Diagnosis not present

## 2015-11-20 DIAGNOSIS — W010XXA Fall on same level from slipping, tripping and stumbling without subsequent striking against object, initial encounter: Secondary | ICD-10-CM | POA: Diagnosis present

## 2015-11-20 DIAGNOSIS — C649 Malignant neoplasm of unspecified kidney, except renal pelvis: Secondary | ICD-10-CM | POA: Diagnosis present

## 2015-11-20 DIAGNOSIS — S0990XA Unspecified injury of head, initial encounter: Secondary | ICD-10-CM | POA: Diagnosis not present

## 2015-11-20 DIAGNOSIS — Z91048 Other nonmedicinal substance allergy status: Secondary | ICD-10-CM | POA: Diagnosis not present

## 2015-11-20 DIAGNOSIS — I351 Nonrheumatic aortic (valve) insufficiency: Secondary | ICD-10-CM | POA: Diagnosis present

## 2015-11-20 DIAGNOSIS — R262 Difficulty in walking, not elsewhere classified: Secondary | ICD-10-CM | POA: Diagnosis not present

## 2015-11-20 DIAGNOSIS — Z8546 Personal history of malignant neoplasm of prostate: Secondary | ICD-10-CM

## 2015-11-20 DIAGNOSIS — J811 Chronic pulmonary edema: Secondary | ICD-10-CM | POA: Diagnosis present

## 2015-11-20 DIAGNOSIS — I272 Other secondary pulmonary hypertension: Secondary | ICD-10-CM | POA: Diagnosis present

## 2015-11-20 DIAGNOSIS — I4891 Unspecified atrial fibrillation: Secondary | ICD-10-CM | POA: Diagnosis present

## 2015-11-20 DIAGNOSIS — S299XXA Unspecified injury of thorax, initial encounter: Secondary | ICD-10-CM | POA: Diagnosis not present

## 2015-11-20 DIAGNOSIS — E039 Hypothyroidism, unspecified: Secondary | ICD-10-CM | POA: Diagnosis present

## 2015-11-20 DIAGNOSIS — Z9181 History of falling: Secondary | ICD-10-CM | POA: Diagnosis not present

## 2015-11-20 DIAGNOSIS — I482 Chronic atrial fibrillation: Secondary | ICD-10-CM | POA: Diagnosis not present

## 2015-11-20 DIAGNOSIS — Z7901 Long term (current) use of anticoagulants: Secondary | ICD-10-CM | POA: Diagnosis not present

## 2015-11-20 DIAGNOSIS — I10 Essential (primary) hypertension: Secondary | ICD-10-CM | POA: Diagnosis present

## 2015-11-20 DIAGNOSIS — W19XXXD Unspecified fall, subsequent encounter: Secondary | ICD-10-CM | POA: Diagnosis not present

## 2015-11-20 DIAGNOSIS — I481 Persistent atrial fibrillation: Secondary | ICD-10-CM | POA: Diagnosis not present

## 2015-11-20 DIAGNOSIS — R293 Abnormal posture: Secondary | ICD-10-CM | POA: Diagnosis not present

## 2015-11-20 DIAGNOSIS — Z79899 Other long term (current) drug therapy: Secondary | ICD-10-CM | POA: Diagnosis not present

## 2015-11-20 DIAGNOSIS — Z888 Allergy status to other drugs, medicaments and biological substances status: Secondary | ICD-10-CM

## 2015-11-20 DIAGNOSIS — Z419 Encounter for procedure for purposes other than remedying health state, unspecified: Secondary | ICD-10-CM

## 2015-11-20 DIAGNOSIS — S72042A Displaced fracture of base of neck of left femur, initial encounter for closed fracture: Secondary | ICD-10-CM | POA: Diagnosis not present

## 2015-11-20 DIAGNOSIS — R001 Bradycardia, unspecified: Secondary | ICD-10-CM | POA: Diagnosis not present

## 2015-11-20 DIAGNOSIS — Z96642 Presence of left artificial hip joint: Secondary | ICD-10-CM | POA: Diagnosis not present

## 2015-11-20 DIAGNOSIS — W19XXXA Unspecified fall, initial encounter: Secondary | ICD-10-CM

## 2015-11-20 DIAGNOSIS — R531 Weakness: Secondary | ICD-10-CM | POA: Diagnosis not present

## 2015-11-20 DIAGNOSIS — Z96649 Presence of unspecified artificial hip joint: Secondary | ICD-10-CM

## 2015-11-20 DIAGNOSIS — S72001A Fracture of unspecified part of neck of right femur, initial encounter for closed fracture: Secondary | ICD-10-CM | POA: Diagnosis not present

## 2015-11-20 HISTORY — DX: Migraine, unspecified, not intractable, without status migrainosus: G43.909

## 2015-11-20 HISTORY — DX: Hypothyroidism, unspecified: E03.9

## 2015-11-20 HISTORY — DX: Disorder of thyroid, unspecified: E07.9

## 2015-11-20 HISTORY — DX: Essential (primary) hypertension: I10

## 2015-11-20 HISTORY — DX: Other specified disorders of kidney and ureter: N28.89

## 2015-11-20 HISTORY — DX: Unspecified atrial fibrillation: I48.91

## 2015-11-20 LAB — CBC WITH DIFFERENTIAL/PLATELET
Basophils Absolute: 0 10*3/uL (ref 0.0–0.1)
Basophils Relative: 0 %
Eosinophils Absolute: 0.1 10*3/uL (ref 0.0–0.7)
Eosinophils Relative: 2 %
HEMATOCRIT: 42.9 % (ref 39.0–52.0)
HEMOGLOBIN: 14.3 g/dL (ref 13.0–17.0)
LYMPHS ABS: 1.4 10*3/uL (ref 0.7–4.0)
LYMPHS PCT: 29 %
MCH: 30.4 pg (ref 26.0–34.0)
MCHC: 33.3 g/dL (ref 30.0–36.0)
MCV: 91.3 fL (ref 78.0–100.0)
MONOS PCT: 10 %
Monocytes Absolute: 0.5 10*3/uL (ref 0.1–1.0)
NEUTROS ABS: 2.7 10*3/uL (ref 1.7–7.7)
NEUTROS PCT: 59 %
Platelets: 135 10*3/uL — ABNORMAL LOW (ref 150–400)
RBC: 4.7 MIL/uL (ref 4.22–5.81)
RDW: 13.2 % (ref 11.5–15.5)
WBC: 4.6 10*3/uL (ref 4.0–10.5)

## 2015-11-20 LAB — BASIC METABOLIC PANEL
Anion gap: 7 (ref 5–15)
BUN: 28 mg/dL — AB (ref 6–20)
CHLORIDE: 106 mmol/L (ref 101–111)
CO2: 23 mmol/L (ref 22–32)
CREATININE: 1.05 mg/dL (ref 0.61–1.24)
Calcium: 9 mg/dL (ref 8.9–10.3)
GFR calc Af Amer: >60 mL/min (ref >60)
GFR calc non Af Amer: 57 mL/min — ABNORMAL LOW (ref >60)
Glucose, Bld: 121 mg/dL — ABNORMAL HIGH (ref 65–99)
POTASSIUM: 4.1 mmol/L (ref 3.5–5.1)
Sodium: 136 mmol/L (ref 135–145)

## 2015-11-20 MED ORDER — ACETAMINOPHEN 500 MG PO TABS
1000.0000 mg | ORAL_TABLET | Freq: Once | ORAL | Status: AC
  Administered 2015-11-20: 1000 mg via ORAL
  Filled 2015-11-20: qty 2

## 2015-11-20 MED ORDER — POLYETHYLENE GLYCOL 3350 17 G PO PACK
17.0000 g | PACK | Freq: Every day | ORAL | Status: DC
  Administered 2015-11-21 – 2015-11-24 (×3): 17 g via ORAL
  Filled 2015-11-20 (×4): qty 1

## 2015-11-20 MED ORDER — LEVOTHYROXINE SODIUM 25 MCG PO TABS
25.0000 ug | ORAL_TABLET | Freq: Every day | ORAL | Status: DC
  Administered 2015-11-21 – 2015-11-25 (×4): 25 ug via ORAL
  Filled 2015-11-20 (×4): qty 1

## 2015-11-20 MED ORDER — MORPHINE SULFATE (PF) 2 MG/ML IV SOLN
2.0000 mg | Freq: Once | INTRAVENOUS | Status: AC
  Administered 2015-11-20: 2 mg via INTRAVENOUS
  Filled 2015-11-20: qty 1

## 2015-11-20 MED ORDER — ONDANSETRON HCL 4 MG/2ML IJ SOLN
4.0000 mg | Freq: Once | INTRAMUSCULAR | Status: AC
  Administered 2015-11-20: 4 mg via INTRAVENOUS
  Filled 2015-11-20: qty 2

## 2015-11-20 MED ORDER — MORPHINE SULFATE (PF) 2 MG/ML IV SOLN
0.5000 mg | INTRAVENOUS | Status: DC | PRN
  Administered 2015-11-20: 0.5 mg via INTRAVENOUS
  Filled 2015-11-20: qty 1

## 2015-11-20 MED ORDER — LORATADINE 10 MG PO TABS
10.0000 mg | ORAL_TABLET | Freq: Every day | ORAL | Status: DC
  Administered 2015-11-21 – 2015-11-25 (×4): 10 mg via ORAL
  Filled 2015-11-20 (×4): qty 1

## 2015-11-20 MED ORDER — HYDROCODONE-ACETAMINOPHEN 5-325 MG PO TABS
1.0000 | ORAL_TABLET | Freq: Four times a day (QID) | ORAL | Status: DC | PRN
Start: 2015-11-20 — End: 2015-11-25
  Administered 2015-11-21: 2 via ORAL
  Administered 2015-11-21 (×2): 1 via ORAL
  Administered 2015-11-22 – 2015-11-25 (×2): 2 via ORAL
  Filled 2015-11-20 (×3): qty 2
  Filled 2015-11-20 (×2): qty 1

## 2015-11-20 MED ORDER — METOPROLOL SUCCINATE ER 25 MG PO TB24
25.0000 mg | ORAL_TABLET | Freq: Every day | ORAL | Status: DC
  Administered 2015-11-21: 25 mg via ORAL
  Filled 2015-11-20: qty 1

## 2015-11-20 NOTE — H&P (Addendum)
History and Physical    Ethan Faulkner [id number] DOB: [birth year] DOA: 11/20/2015   PCP: Sallee Lange, MD Chief Complaint:  Chief Complaint  Patient presents with  . Fall  . Hip Pain    HPI: Ethan Faulkner is a 82 y.o. male with medical history significant of Prostate cancer 14 years ago, a renal mass that is being managed non-surgically due to slow growth and patients age, A.Fib on eliquis.  Patient presents to the ED after a mechanical fall at home PTA.  Resulting left hip pain, worse with movement, associated inability to bear weight.  No syncope, no hitting head.  Lives alone at home, fall was secondary to a trip, used life-alert to get EMS.  ED Course: Found to have L femoral neck fx.  Review of Systems: As per HPI otherwise 10 point review of systems negative.    Past Medical History  Diagnosis Date  . Prostate cancer ([place])     14 years ago  . Renal mass     Past Surgical History  Procedure Laterality Date  . Colonoscopy  1999     reports that he has never smoked. He does not have any smokeless tobacco history on file. He reports that he does not drink alcohol. His drug history is not on file.  Allergies  Allergen Reactions  . Xanax [Alprazolam] Other (See Comments)    Dizzy, Sedated  . Tape Rash    PATIENT'S SKIN WILL TEAR/PLEASE EITHER USE PAPER TAPE OR COBAN WRAP!!    History reviewed. No pertinent family history. Multiple relatives lived into their 76s.   Prior to Admission medications   Medication Sig Start Date End Date Taking? Authorizing Provider  acetaminophen (TYLENOL) 325 MG tablet Take 325-650 mg by mouth every 6 (six) hours as needed for mild pain or moderate pain.   Yes Historical Provider, MD  cetirizine (ZYRTEC) 10 MG tablet Take 10 mg by mouth daily.   Yes Historical Provider, MD  ELIQUIS 5 MG TABS tablet TAKE (1) TABLET BY MOUTH TWICE DAILY. 10/09/15  Yes Kathyrn Drown, MD  levothyroxine (SYNTHROID, LEVOTHROID) 25 MCG tablet TAKE (1)  TABLET BY MOUTH EACH MORNING. 10/28/15  Yes Kathyrn Drown, MD  metoprolol succinate (TOPROL-XL) 25 MG 24 hr tablet Take 1 tablet (25 mg total) by mouth daily. 03/12/15  Yes Kathyrn Drown, MD  OVER THE COUNTER MEDICATION Cinnamon Capsule: Take 1 capsule by mouth daily   Yes Historical Provider, MD  polyethylene glycol powder (GLYCOLAX/MIRALAX) powder MIX 1 CAPFUL IN 8 OZ OF WATER AND DRINK DAILY. 08/26/15  Yes Kathyrn Drown, MD    Physical Exam: Filed Vitals:   11/20/15 1902 11/20/15 1915 11/20/15 1930 11/20/15 2015  BP:  177/83 177/83 154/77  Pulse:  69  77  Resp:  14  11  Height: 5\' 10"  (1.778 m)     Weight: 72.576 kg (160 lb)     SpO2: 98% 95%  92%      Constitutional: NAD, calm, comfortable Eyes: PERRL, lids and conjunctivae normal ENMT: Mucous membranes are moist. Posterior pharynx clear of any exudate or lesions.Normal dentition.  Neck: normal, supple, no masses, no thyromegaly Respiratory: clear to auscultation bilaterally, no wheezing, no crackles. Normal respiratory effort. No accessory muscle use.  Cardiovascular: Regular rate and rhythm, no murmurs / rubs / gallops. No extremity edema. 2+ pedal pulses. No carotid bruits.  Abdomen: no tenderness, no masses palpated. No hepatosplenomegaly. Bowel sounds positive.  Musculoskeletal: no clubbing / cyanosis. No  joint deformity upper and lower extremities. Good ROM, no contractures. Normal muscle tone.  Skin: no rashes, lesions, ulcers. No induration Neurologic: CN 2-12 grossly intact. Sensation intact, DTR normal. Strength 5/5 in all 4.  Psychiatric: Normal judgment and insight. Alert and oriented x 3. Normal mood.    Labs on Admission: I have personally reviewed following labs and imaging studies  CBC:  Recent Labs Lab 11/20/15 1920  WBC 4.6  NEUTROABS 2.7  HGB 14.3  HCT 42.9  MCV 91.3  PLT [id number]*   Basic Metabolic Panel:  Recent Labs Lab 11/20/15 1920  NA 136  K 4.1  CL 106  CO2 23  GLUCOSE 121*  BUN 28*    CREATININE 1.05  CALCIUM 9.0   GFR: Estimated Creatinine Clearance: 41.3 mL/min (by C-G formula based on Cr of 1.05). Liver Function Tests: No results for input(s): AST, ALT, ALKPHOS, BILITOT, PROT, ALBUMIN in the last 168 hours. No results for input(s): LIPASE, AMYLASE in the last 168 hours. No results for input(s): AMMONIA in the last 168 hours. Coagulation Profile: No results for input(s): INR, PROTIME in the last 168 hours. Cardiac Enzymes: No results for input(s): CKTOTAL, CKMB, CKMBINDEX, TROPONINI in the last 168 hours. BNP (last 3 results) No results for input(s): PROBNP in the last 8760 hours. HbA1C: No results for input(s): HGBA1C in the last 72 hours. CBG: No results for input(s): GLUCAP in the last 168 hours. Lipid Profile: No results for input(s): CHOL, HDL, LDLCALC, TRIG, CHOLHDL, LDLDIRECT in the last 72 hours. Thyroid Function Tests: No results for input(s): TSH, T4TOTAL, FREET4, T3FREE, THYROIDAB in the last 72 hours. Anemia Panel: No results for input(s): VITAMINB12, FOLATE, FERRITIN, TIBC, IRON, RETICCTPCT in the last 72 hours. Urine analysis:    Component Value Date/Time   UROBILINOGEN 2.0 05/28/2015 1608   LEUKOCYTESUR moderate (2+) 01/25/2014 1105   Sepsis Labs: @LABRCNTIP (procalcitonin:4,lacticidven:4) )No results found for this or any previous visit (from the past 240 hour(s)).   Radiological Exams on Admission: Dg Chest 1 View  11/20/2015  CLINICAL DATA:  Status post fall with left hip pain. History of prostate cancer. EXAM: CHEST 1 VIEW COMPARISON:  None. FINDINGS: The mediastinal contour is normal. The aorta is tortuous. The heart size is enlarged. There is pulmonary edema. There is no focal pneumonia or pleural effusion. There is scoliosis of spine. IMPRESSION: Pulmonary edema.  Cardiomegaly. Electronically Signed   By: Abelardo Diesel M.D.   On: 11/20/2015 20:22   Ct Head Wo Contrast  11/20/2015  CLINICAL DATA:  Fall. EXAM: CT HEAD WITHOUT CONTRAST  TECHNIQUE: Contiguous axial images were obtained from the base of the skull through the vertex without intravenous contrast. COMPARISON:  02/07/2008 FINDINGS: There is no evidence for acute hemorrhage, hydrocephalus, mass lesion, or abnormal extra-axial fluid collection. No definite CT evidence for acute infarction. Diffuse loss of parenchymal volume is consistent with atrophy. Patchy low attenuation in the deep hemispheric and periventricular white matter is nonspecific, but likely reflects chronic microvascular ischemic demyelination. Globes are unremarkable. The visualized paranasal sinuses and mastoid air cells are clear. No evidence for skull fracture IMPRESSION: 1. No acute intracranial abnormality. 2. Atrophy with chronic small vessel white matter ischemic disease. Electronically Signed   By: Misty Stanley M.D.   On: 11/20/2015 20:03   Dg Hip Unilat With Pelvis 2-3 Views Left  11/20/2015  CLINICAL DATA:  Fall with left hip pain.  Initial encounter. EXAM: DG HIP (WITH OR WITHOUT PELVIS) 2-3V LEFT COMPARISON:  None. FINDINGS: Essentially single  view imaging of the left hip which shows a basicervical left femoral neck fracture with medial impaction. No notable acetabular degenerative change. No evidence of pelvic ring fracture or diastasis. Osteopenia and atherosclerosis. IMPRESSION: Basicervical left femoral neck fracture. Electronically Signed   By: Monte Fantasia M.D.   On: 11/20/2015 20:21    EKG: Independently reviewed.  Assessment/Plan Principal Problem:   Femoral neck fracture (HCC) Active Problems:   Atrial fibrillation (HCC)   Long-term (current) use of anticoagulants   Renal malignant tumor (HCC)   Femoral neck fx of left hip -  NPO after 0600, Dr. Marlou Sa says surg likely tomorrow afternoon or Fri morning due to eliquis hx  Ortho to see in AM  Hip fx pathway  Holding eliquis, last dose was this morning, PT-INR ordered for AM tomorrow  A.Fib -  Holding eliquis for surgery  Resume  eliquis after surgery for this and DVT ppx  H/o Renal malignant tumor - surgery not recommended due to age   DVT prophylaxis: SCDs, resume eliquis when ok with ortho Code Status: Full Family Communication: Family at bedside Consults called: Ortho Admission status: Admit to inpatient   Etta Quill DO [facility] Pager [phone] from 7PM-7AM  If 7AM-7PM, please contact the day physician for the patient [email or web address] Password [facility]  11/20/2015, 8:56 PM

## 2015-11-20 NOTE — ED Notes (Signed)
Pt presents from home with [facility] for fall with resulting LEFT hip pain; EMS reports deformity to LEFT hip; pt denies syncope, or hitting head; pt was given 10mg  morphine and 4mg  zofran by EMS enroute

## 2015-11-20 NOTE — ED Provider Notes (Signed)
CSN: [id number]     Arrival date & time 11/20/15  1902 History   First MD Initiated Contact with Patient 11/20/15 1904     Chief Complaint  Patient presents with  . Fall  . Hip Pain     (Consider location/radiation/quality/duration/timing/severity/associated sxs/prior Treatment) Patient is a 82 y.o. male presenting with fall and hip pain.  Fall This is a new problem. The current episode started less than 1 hour ago. The problem occurs constantly. The problem has not changed since onset.Pertinent negatives include no chest pain, no abdominal pain, no headaches and no shortness of breath. Nothing aggravates the symptoms. Nothing relieves the symptoms. He has tried nothing for the symptoms. The treatment provided no relief.  Hip Pain Pertinent negatives include no chest pain, no abdominal pain, no headaches and no shortness of breath.   82 yo M With left hip pain. This after a mechanical fall. Patient thinks he tripped over something that was left in the floor. Pain to left hip unable to get up and walk on it. Has no prior history of orthopedic injuries.  Past Medical History  Diagnosis Date  . Prostate cancer ([place])     14 years ago  . Renal mass   . Hypertension   . Afib ([place])   . Thyroid disease     hypothyroid   Past Surgical History  Procedure Laterality Date  . Colonoscopy  1999   History reviewed. No pertinent family history. Social History  Substance Use Topics  . Smoking status: Never Smoker   . Smokeless tobacco: None  . Alcohol Use: No    Review of Systems  Constitutional: Negative for fever and chills.  HENT: Negative for congestion and facial swelling.   Eyes: Negative for discharge and visual disturbance.  Respiratory: Negative for shortness of breath.   Cardiovascular: Negative for chest pain and palpitations.  Gastrointestinal: Negative for vomiting, abdominal pain and diarrhea.  Musculoskeletal: Positive for myalgias and arthralgias.  Skin: Negative for  color change and rash.  Neurological: Negative for tremors, syncope and headaches.  Psychiatric/Behavioral: Negative for confusion and dysphoric mood.      Allergies  Xanax and Tape  Home Medications   Prior to Admission medications   Medication Sig Start Date End Date Taking? Authorizing Provider  acetaminophen (TYLENOL) 325 MG tablet Take 325-650 mg by mouth every 6 (six) hours as needed for mild pain or moderate pain.   Yes Historical Provider, MD  cetirizine (ZYRTEC) 10 MG tablet Take 10 mg by mouth daily.   Yes Historical Provider, MD  ELIQUIS 5 MG TABS tablet TAKE (1) TABLET BY MOUTH TWICE DAILY. 10/09/15  Yes Kathyrn Drown, MD  levothyroxine (SYNTHROID, LEVOTHROID) 25 MCG tablet TAKE (1) TABLET BY MOUTH EACH MORNING. 10/28/15  Yes Kathyrn Drown, MD  metoprolol succinate (TOPROL-XL) 25 MG 24 hr tablet Take 1 tablet (25 mg total) by mouth daily. 03/12/15  Yes Kathyrn Drown, MD  OVER THE COUNTER MEDICATION Cinnamon Capsule: Take 1 capsule by mouth daily   Yes Historical Provider, MD  polyethylene glycol powder (GLYCOLAX/MIRALAX) powder MIX 1 CAPFUL IN 8 OZ OF WATER AND DRINK DAILY. 08/26/15  Yes Scott A Luking, MD   BP 141/65 mmHg  Pulse 79  Temp(Src) 98.2 F (36.8 C) (Oral)  Resp 18  Ht 5\' 10"  (1.778 m)  Wt 160 lb (72.576 kg)  BMI 22.96 kg/m2  SpO2 98% Physical Exam  Constitutional: He is oriented to person, place, and time. He appears well-developed and  well-nourished.  HENT:  Head: Normocephalic and atraumatic.  Eyes: EOM are normal. Pupils are equal, round, and reactive to light.  Neck: Normal range of motion. Neck supple. No JVD present.  Cardiovascular: Normal rate and regular rhythm.  Exam reveals no gallop and no friction rub.   No murmur heard. Pulmonary/Chest: No respiratory distress. He has no wheezes.  Abdominal: He exhibits no distension. There is no rebound and no guarding.  Musculoskeletal: Normal range of motion. He exhibits tenderness.  Tender palpation in  defect noted to the greater tuberosity of the left leg. Pulse motor and sensation is intact distally. Pain with internal and external rotation.   Neurological: He is alert and oriented to person, place, and time.  Skin: No rash noted. No pallor.  Psychiatric: He has a normal mood and affect. His behavior is normal.  Nursing note and vitals reviewed.   ED Course  Procedures (including critical care time) Labs Review Labs Reviewed  CBC WITH DIFFERENTIAL/PLATELET - Abnormal; Notable for the following:    Platelets 135 (*)    All other components within normal limits  BASIC METABOLIC PANEL - Abnormal; Notable for the following:    Glucose, Bld 121 (*)    BUN 28 (*)    GFR calc non Af Amer 57 (*)    All other components within normal limits  PROTIME-INR    Imaging Review Dg Chest 1 View  11/20/2015  CLINICAL DATA:  Status post fall with left hip pain. History of prostate cancer. EXAM: CHEST 1 VIEW COMPARISON:  None. FINDINGS: The mediastinal contour is normal. The aorta is tortuous. The heart size is enlarged. There is pulmonary edema. There is no focal pneumonia or pleural effusion. There is scoliosis of spine. IMPRESSION: Pulmonary edema.  Cardiomegaly. Electronically Signed   By: Abelardo Diesel M.D.   On: 11/20/2015 20:22   Ct Head Wo Contrast  11/20/2015  CLINICAL DATA:  Fall. EXAM: CT HEAD WITHOUT CONTRAST TECHNIQUE: Contiguous axial images were obtained from the base of the skull through the vertex without intravenous contrast. COMPARISON:  02/07/2008 FINDINGS: There is no evidence for acute hemorrhage, hydrocephalus, mass lesion, or abnormal extra-axial fluid collection. No definite CT evidence for acute infarction. Diffuse loss of parenchymal volume is consistent with atrophy. Patchy low attenuation in the deep hemispheric and periventricular white matter is nonspecific, but likely reflects chronic microvascular ischemic demyelination. Globes are unremarkable. The visualized paranasal  sinuses and mastoid air cells are clear. No evidence for skull fracture IMPRESSION: 1. No acute intracranial abnormality. 2. Atrophy with chronic small vessel white matter ischemic disease. Electronically Signed   By: Misty Stanley M.D.   On: 11/20/2015 20:03   Dg Hip Unilat With Pelvis 2-3 Views Left  11/20/2015  CLINICAL DATA:  Fall with left hip pain.  Initial encounter. EXAM: DG HIP (WITH OR WITHOUT PELVIS) 2-3V LEFT COMPARISON:  None. FINDINGS: Essentially single view imaging of the left hip which shows a basicervical left femoral neck fracture with medial impaction. No notable acetabular degenerative change. No evidence of pelvic ring fracture or diastasis. Osteopenia and atherosclerosis. IMPRESSION: Basicervical left femoral neck fracture. Electronically Signed   By: Monte Fantasia M.D.   On: 11/20/2015 20:21   I have personally reviewed and evaluated these images and lab results as part of my medical decision-making.   EKG Interpretation   Date/Time:  Wednesday November 20 2015 19:17:19 EDT Ventricular Rate:  72 PR Interval:    QRS Duration: 116 QT Interval:  433 QTC Calculation:  474 R Axis:   -29 Text Interpretation:  Atrial fibrillation Inferior infarct, old Anterior  infarct, old No old tracing to compare Confirmed by [facility] MD, DANIEL  [phone]) on 11/20/2015 7:27:55 PM      MDM   Final diagnoses:  Fall    89 yoM with a chief complaint of left hip pain. Patient found to have a femoral neck fracture. Discussed with Dr. Marlou Sa, orthopedics admit to medicine.  The patients results and plan were reviewed and discussed.   Any x-rays performed were independently reviewed by myself.   Differential diagnosis were considered with the presenting HPI.  Medications  loratadine (CLARITIN) tablet 10 mg (10 mg Oral Not Given 11/20/15 2231)  metoprolol succinate (TOPROL-XL) 24 hr tablet 25 mg (not administered)  levothyroxine (SYNTHROID, LEVOTHROID) tablet 25 mcg (not administered)   polyethylene glycol (MIRALAX / GLYCOLAX) packet 17 g (not administered)  HYDROcodone-acetaminophen (NORCO/VICODIN) 5-325 MG per tablet 1-2 tablet (not administered)  morphine 2 MG/ML injection 0.5 mg (0.5 mg Intravenous Given 11/20/15 2242)  morphine 2 MG/ML injection 2 mg (2 mg Intravenous Given 11/20/15 1925)  acetaminophen (TYLENOL) tablet 1,000 mg (1,000 mg Oral Given 11/20/15 1925)  ondansetron (ZOFRAN) injection 4 mg (4 mg Intravenous Given 11/20/15 1925)  morphine 2 MG/ML injection 2 mg (2 mg Intravenous Given 11/20/15 2056)    Filed Vitals:   11/20/15 2015 11/20/15 2100 11/20/15 2134 11/20/15 2240  BP: 154/77 163/80 164/110 141/65  Pulse: 77 73 79   Temp:   98.2 F (36.8 C)   TempSrc:   Oral   Resp: 11 15 18    Height:      Weight:      SpO2: 92% 96% 98%     Final diagnoses:  Fall    Admission/ observation were discussed with the admitting physician, patient and/or family and they are comfortable with the plan.      Deno Etienne, DO 11/20/15 2320

## 2015-11-20 NOTE — Progress Notes (Signed)
82 yo ambulatory pt uses cane lives alone mech fall today on eliquis for afib - otherwise healthy  No other ortho complaints Ortho exam of extremities ok except for lle shortening and external rotation  Xray fem neck fracture left  Plan hemi vs tha Friday afternoon Will have dr Erlinda Hong eval in am

## 2015-11-21 ENCOUNTER — Telehealth: Payer: Self-pay | Admitting: Family Medicine

## 2015-11-21 ENCOUNTER — Encounter (HOSPITAL_COMMUNITY): Payer: Self-pay | Admitting: General Practice

## 2015-11-21 DIAGNOSIS — C642 Malignant neoplasm of left kidney, except renal pelvis: Secondary | ICD-10-CM

## 2015-11-21 DIAGNOSIS — Z7901 Long term (current) use of anticoagulants: Secondary | ICD-10-CM

## 2015-11-21 DIAGNOSIS — Z01810 Encounter for preprocedural cardiovascular examination: Secondary | ICD-10-CM

## 2015-11-21 DIAGNOSIS — I481 Persistent atrial fibrillation: Secondary | ICD-10-CM

## 2015-11-21 DIAGNOSIS — S72002A Fracture of unspecified part of neck of left femur, initial encounter for closed fracture: Principal | ICD-10-CM

## 2015-11-21 LAB — SURGICAL PCR SCREEN
MRSA, PCR: NEGATIVE
STAPHYLOCOCCUS AUREUS: NEGATIVE

## 2015-11-21 LAB — PROTIME-INR
INR: 1.28 (ref 0.00–1.49)
Prothrombin Time: 16.1 seconds — ABNORMAL HIGH (ref 11.6–15.2)

## 2015-11-21 MED ORDER — METOPROLOL SUCCINATE ER 25 MG PO TB24
12.5000 mg | ORAL_TABLET | Freq: Every day | ORAL | Status: DC
  Administered 2015-11-22 – 2015-11-25 (×4): 12.5 mg via ORAL
  Filled 2015-11-21: qty 1
  Filled 2015-11-21: qty 0.5
  Filled 2015-11-21 (×2): qty 1

## 2015-11-21 MED ORDER — ENSURE ENLIVE PO LIQD
237.0000 mL | Freq: Two times a day (BID) | ORAL | Status: DC
  Administered 2015-11-21 – 2015-11-25 (×6): 237 mL via ORAL

## 2015-11-21 MED ORDER — POVIDONE-IODINE 10 % EX SWAB: 2.0000 "application " | Freq: Once | CUTANEOUS | Status: DC

## 2015-11-21 MED ORDER — CEFAZOLIN SODIUM-DEXTROSE 2-4 GM/100ML-% IV SOLN: 2.0000 g | INTRAVENOUS | Status: DC

## 2015-11-21 MED ORDER — SODIUM CHLORIDE 0.9 % IV SOLN
INTRAVENOUS | Status: DC
  Administered 2015-11-22: 03:00:00 via INTRAVENOUS

## 2015-11-21 MED ORDER — CEFAZOLIN SODIUM-DEXTROSE 2-4 GM/100ML-% IV SOLN
2.0000 g | INTRAVENOUS | Status: DC
  Filled 2015-11-21: qty 100

## 2015-11-21 NOTE — Anesthesia Preprocedure Evaluation (Addendum)
Anesthesia Evaluation  Patient identified by MRN, date of birth, ID band Patient awake    Reviewed: Allergy & Precautions, H&P , NPO status , Patient's Chart, lab work & pertinent test results  Airway Mallampati: II  TM Distance: >3 FB Neck ROM: full    Dental  (+) Edentulous Upper, Edentulous Lower   Pulmonary former smoker,    Pulmonary exam normal        Cardiovascular hypertension, Pt. on medications negative cardio ROS Normal cardiovascular exam     Neuro/Psych negative psych ROS   GI/Hepatic negative GI ROS, Neg liver ROS,   Endo/Other    Renal/GU      Musculoskeletal   Abdominal Normal abdominal exam  (+)   Peds  Hematology negative hematology ROS (+)   Anesthesia Other Findings   Reproductive/Obstetrics negative OB ROS                             Anesthesia Physical Anesthesia Plan  ASA: III  Anesthesia Plan: General   Post-op Pain Management:    Induction: Intravenous  Airway Management Planned: Oral ETT  Additional Equipment:   Intra-op Plan:   Post-operative Plan: Extubation in OR  Informed Consent: I have reviewed the patients History and Physical, chart, labs and discussed the procedure including the risks, benefits and alternatives for the proposed anesthesia with the patient or authorized representative who has indicated his/her understanding and acceptance.   Dental Advisory Given  Plan Discussed with: CRNA and Surgeon  Anesthesia Plan Comments:         Anesthesia Quick Evaluation

## 2015-11-21 NOTE — Telephone Encounter (Signed)
FYI- Daughter([name]) called to let you know patient fell yesterday at home and broke his hip. He is at [facility] and will be having surgery 6/30.And she wanted to know about rehab for patient at [facility] when he gets out of the hospital can you set that up or would she have to go through [facility].you can reach her at [phone].

## 2015-11-21 NOTE — Progress Notes (Signed)
Spoke with family about the advance directives they state they are having a family meeting this evening

## 2015-11-21 NOTE — Progress Notes (Signed)
Tele. Notified me that Ethan Faulkner is in A-fib with a 2.5 pause, Dr Marthenia Rolling notified, requested [facility] cardiologist name which is Dr Bronson Ing of the [facility] in [place].

## 2015-11-21 NOTE — Consult Note (Signed)
ORTHOPAEDIC CONSULTATION  REQUESTING PHYSICIAN: Bonnell Public, MD  Chief Complaint: Left hip fracture  HPI: Ethan Faulkner is a 82 y.o. male who presents with left hip fracture s/p mechanical fall.  The patient endorses severe pain in the left hip, that does not radiate, grinding in quality, worse with any movement, better with immobilization.  Denies LOC/fever/chills/nausea/vomiting.  Walks with assistive devices (walker, cane, wheelchair).  Does live independently.  Community ambulator.  Past Medical History  Diagnosis Date  . Prostate cancer ([place])     14 years ago  . Renal mass   . Hypertension   . Afib ([place])   . Thyroid disease     hypothyroid   Past Surgical History  Procedure Laterality Date  . Colonoscopy  1999   Social History   Social History  . Marital Status: Widowed    Spouse Name: N/A  . Number of Children: N/A  . Years of Education: N/A   Social History Main Topics  . Smoking status: Never Smoker   . Smokeless tobacco: None  . Alcohol Use: No  . Drug Use: None  . Sexual Activity: Not Asked   Other Topics Concern  . None   Social History Narrative   History reviewed. No pertinent family history. Allergies  Allergen Reactions  . Xanax [Alprazolam] Other (See Comments)    Dizzy, Sedated  . Tape Rash    PATIENT'S SKIN WILL TEAR/PLEASE EITHER USE PAPER TAPE OR COBAN WRAP!!   Prior to Admission medications   Medication Sig Start Date End Date Taking? Authorizing Provider  acetaminophen (TYLENOL) 325 MG tablet Take 325-650 mg by mouth every 6 (six) hours as needed for mild pain or moderate pain.   Yes Historical Provider, MD  cetirizine (ZYRTEC) 10 MG tablet Take 10 mg by mouth daily.   Yes Historical Provider, MD  ELIQUIS 5 MG TABS tablet TAKE (1) TABLET BY MOUTH TWICE DAILY. 10/09/15  Yes Kathyrn Drown, MD  levothyroxine (SYNTHROID, LEVOTHROID) 25 MCG tablet TAKE (1) TABLET BY MOUTH EACH MORNING. 10/28/15  Yes Kathyrn Drown, MD  metoprolol  succinate (TOPROL-XL) 25 MG 24 hr tablet Take 1 tablet (25 mg total) by mouth daily. 03/12/15  Yes Kathyrn Drown, MD  OVER THE COUNTER MEDICATION Cinnamon Capsule: Take 1 capsule by mouth daily   Yes Historical Provider, MD  polyethylene glycol powder (GLYCOLAX/MIRALAX) powder MIX 1 CAPFUL IN 8 OZ OF WATER AND DRINK DAILY. 08/26/15  Yes Kathyrn Drown, MD   Dg Chest 1 View  11/20/2015  CLINICAL DATA:  Status post fall with left hip pain. History of prostate cancer. EXAM: CHEST 1 VIEW COMPARISON:  None. FINDINGS: The mediastinal contour is normal. The aorta is tortuous. The heart size is enlarged. There is pulmonary edema. There is no focal pneumonia or pleural effusion. There is scoliosis of spine. IMPRESSION: Pulmonary edema.  Cardiomegaly. Electronically Signed   By: Abelardo Diesel M.D.   On: 11/20/2015 20:22   Ct Head Wo Contrast  11/20/2015  CLINICAL DATA:  Fall. EXAM: CT HEAD WITHOUT CONTRAST TECHNIQUE: Contiguous axial images were obtained from the base of the skull through the vertex without intravenous contrast. COMPARISON:  02/07/2008 FINDINGS: There is no evidence for acute hemorrhage, hydrocephalus, mass lesion, or abnormal extra-axial fluid collection. No definite CT evidence for acute infarction. Diffuse loss of parenchymal volume is consistent with atrophy. Patchy low attenuation in the deep hemispheric and periventricular white matter is nonspecific, but likely reflects chronic microvascular ischemic demyelination. Globes  are unremarkable. The visualized paranasal sinuses and mastoid air cells are clear. No evidence for skull fracture IMPRESSION: 1. No acute intracranial abnormality. 2. Atrophy with chronic small vessel white matter ischemic disease. Electronically Signed   By: Misty Stanley M.D.   On: 11/20/2015 20:03   Dg Hip Unilat With Pelvis 2-3 Views Left  11/20/2015  CLINICAL DATA:  Fall with left hip pain.  Initial encounter. EXAM: DG HIP (WITH OR WITHOUT PELVIS) 2-3V LEFT COMPARISON:   None. FINDINGS: Essentially single view imaging of the left hip which shows a basicervical left femoral neck fracture with medial impaction. No notable acetabular degenerative change. No evidence of pelvic ring fracture or diastasis. Osteopenia and atherosclerosis. IMPRESSION: Basicervical left femoral neck fracture. Electronically Signed   By: Monte Fantasia M.D.   On: 11/20/2015 20:21    All pertinent xrays, MRI, CT independently reviewed and interpreted  Positive ROS: All other systems have been reviewed and were otherwise negative with the exception of those mentioned in the HPI and as above.  Physical Exam: General: Alert, no acute distress Cardiovascular: No pedal edema Respiratory: No cyanosis, no use of accessory musculature GI: No organomegaly, abdomen is soft and non-tender Skin: No lesions in the area of chief complaint Neurologic: Sensation intact distally Psychiatric: Patient is competent for consent with normal mood and affect Lymphatic: No axillary or cervical lymphadenopathy  MUSCULOSKELETAL:  - severe pain with movement of the hip and extremity - skin intact - NVI distally - compartments soft  Assessment: Left femoral neck hip fracture  Plan: - surgery is recommended, patient and family are aware of r/b/a and wish to proceed - consent obtained - medical optimization per primary team - surgery is planned for Friday due to last dose of eliquis wed am - Based on history and fracture pattern this likely represents a fragility fracture. - Fragility fractures affect up to one half of women and one third of men after age 52 years and occur in the setting of bone disorder such as osteoporosis or osteopenia and warrant appropriate work-up. - The following are general recommendations that may serve as an outline for an appropriate work-up:  1.) Obtain bone density measurement to confirm presumptive diagnosis, assess severity of osteoporosis and risk of future fracture, and  use as baseline for monitoring treatment  2.) Obtain laboratory tests: CBC, ESR, serum calcium, creatinine, albumin,phosphate, alkaline phosphatase, liver transaminases, protein electrophoresis, urinalysis, 25-hydroxyvitamin D.  3.) Exclude secondary causes of low bone mass and skeletal fragility (eg,multiple myeloma, lymphoma) as indicated.  4.) Obtain radiograph of thoracic and lumbar spine, particularly among individuals with back pain or height loss to assess presence of vertebral fractures  5.) Intermittent administration of recombinant human parathyroid hormone  6.) Optimize nutritional status using nutritional supplementation.  7.) Patient/family education to prevent future falls.  8.) Early mobilization and exercise program - exercise decreases the rate of bone loss and has been associated with decreased rate of fragility fractures   Thank you for the consult and the opportunity to see Ethan Faulkner. Eduard Roux, MD [place] 7:55 AM

## 2015-11-21 NOTE — Consult Note (Signed)
CARDIOLOGY CONSULT NOTE   Patient ID: Ethan Faulkner MRN: [id number], DOB/AGE: Sep 25, 1915   Admit date: 11/20/2015 Date of Consult: 11/21/2015   Primary Physician: Sallee Lange, MD Primary Cardiologist: Dr. Bronson Ing  Pt. Profile  Ethan Faulkner is a pleasant 82 year old male with past medical history of permanent atrial atrial fibrillation on eliquis, hypothyroidism, history of prostate cancer s/p radiation therapy in 1995 presented with fall and L hip fracture.  Problem List  Past Medical History  Diagnosis Date  . Renal mass     "slow growing something; dr's are just watching it right now" (11/21/2015)  . Hypertension   . Afib ([place])   . Thyroid disease     hypothyroid  . Hypothyroidism   . Migraine     "maybe monthly" (11/21/2015)  . Prostate cancer ([place]) ~ 1995    S/P radiation tx    Past Surgical History  Procedure Laterality Date  . Colonoscopy  1999  . Esophagogastroduodenoscopy    . Laparoscopic cholecystectomy  1980s  . Prostate biopsy  ~ 1995     Allergies  Allergies  Allergen Reactions  . Xanax [Alprazolam] Other (See Comments)    Dizzy, Sedated  . Tape Rash    PATIENT'S SKIN WILL TEAR/PLEASE EITHER USE PAPER TAPE OR COBAN WRAP!!    HPI   Ethan Faulkner is a pleasant 82 year old male with past medical history of permanent atrial atrial fibrillation on eliquis, hypothyroidism, history of prostate cancer s/p radiation therapy in 1995. 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 He was seen by Dr. Bronson Ing on 06/22/2013. After discussing risk and benefit comparing aspirin versus eliquis, the decision was ultimately left to PCP. He was started on eliquis in February 2015 by Dr. Wolfgang Phoenix, his PCP. He has not had another cardiology follow-up since then. Otherwise he has been doing well despite his advanced age. He does not do any strenuous activity, however has been living independently and continues to  drive to this day. He does not require much assistance for his daily ADL. His last echocardiogram obtained on 06/12/2013 showed EF 55-60%, mild-to-moderate AR, peak PA pressure 42 mmHg.  He was in his usual stated of health until he came in on 11/20/2015 after a fall. He says he landed on his left side hitting his left hip and also scraped his left arm against the nearest table. Unilateral hip x-ray showed basicervical left femoral neck fracture. Chest x-ray was concerning for pulmonary edema. CT of the head was negative for acute process. EKG showed atrial fibrillation. Patient has been seen by orthopedic doctor and the plan is to have surgery tomorrow on 11/22/2015. While admitted by internal medicine service, it was noted on telemetry that he sometimes have slow atrial fibrillation with heart rate in the high 40s to low 50s. He has been consulted for preoperative clearance.   Inpatient Medications  . [START ON 11/22/2015]  ceFAZolin (ANCEF) IV  2 g Intravenous To SS-Surg  . feeding supplement (ENSURE ENLIVE)  237 mL Oral BID BM  . levothyroxine  25 mcg Oral QAC breakfast  . loratadine  10 mg Oral Daily  . metoprolol succinate  25 mg Oral Daily  . polyethylene glycol  17 g Oral Daily    Family History Family History  Problem Relation Age of Onset  . CAD Neg Hx      Social History Social History   Social History  . Marital Status: Widowed  Spouse Name: N/A  . Number of Children: N/A  . Years of Education: N/A   Occupational History  . Not on file.   Social History Main Topics  . Smoking status: Former Research scientist (life sciences)  . Smokeless tobacco: Never Used     Comment: 11/21/2015 "might hae smoked 2 packs of cigarettes in my lifetime"  . Alcohol Use: Yes     Comment: 11/21/2015 "last beer I've had was in ~ 2007"  . Drug Use: No  . Sexual Activity: No   Other Topics Concern  . Not on file   Social History Narrative     Review of Systems  General:  No chills, fever, night sweats or  weight changes.  Cardiovascular:  No chest pain, edema, orthopnea, palpitations, paroxysmal nocturnal dyspnea. +dyspnea on exertion Dermatological: No rash, lesions/masses Respiratory: No cough, dyspnea Urologic: No hematuria, dysuria Abdominal:   No nausea, vomiting, diarrhea, bright red blood per rectum, melena, or hematemesis Neurologic:  No visual changes, wkns, changes in mental status. R hip pain All other systems reviewed and are otherwise negative except as noted above.  Physical Exam  Blood pressure 158/68, pulse 97, temperature 98.4 F (36.9 C), temperature source Oral, resp. rate 18, height 5\' 10"  (1.778 m), weight 160 lb (72.576 kg), SpO2 98 %.  General: Pleasant, NAD Psych: Normal affect. Neuro: Alert and oriented X 3. Moves all extremities spontaneously. HEENT: Normal  Neck: Supple without bruits or JVD. Lungs:  Resp regular and unlabored, CTA. Heart: irregular. no s3, s4, or murmurs. Abdomen: Soft, non-tender, non-distended, BS + x 4.  Extremities: No clubbing, cyanosis or edema.  Labs  No results for input(s): CKTOTAL, CKMB, TROPONINI in the last 72 hours. Lab Results  Component Value Date   WBC 4.6 11/20/2015   HGB 14.3 11/20/2015   HCT 42.9 11/20/2015   MCV 91.3 11/20/2015   PLT 135* 11/20/2015    Recent Labs Lab 11/20/15 1920  NA 136  K 4.1  CL 106  CO2 23  BUN 28*  CREATININE 1.05  CALCIUM 9.0  GLUCOSE 121*   Lab Results  Component Value Date   CHOL 180 09/28/2013   HDL 42 09/28/2013   LDLCALC 102* 09/28/2013   TRIG 182* 09/28/2013   No results found for: DDIMER  Radiology/Studies  Dg Chest 1 View  11/20/2015  CLINICAL DATA:  Status post fall with left hip pain. History of prostate cancer. EXAM: CHEST 1 VIEW COMPARISON:  None. FINDINGS: The mediastinal contour is normal. The aorta is tortuous. The heart size is enlarged. There is pulmonary edema. There is no focal pneumonia or pleural effusion. There is scoliosis of spine. IMPRESSION:  Pulmonary edema.  Cardiomegaly. Electronically Signed   By: Abelardo Diesel M.D.   On: 11/20/2015 20:22   Ct Head Wo Contrast  11/20/2015  CLINICAL DATA:  Fall. EXAM: CT HEAD WITHOUT CONTRAST TECHNIQUE: Contiguous axial images were obtained from the base of the skull through the vertex without intravenous contrast. COMPARISON:  02/07/2008 FINDINGS: There is no evidence for acute hemorrhage, hydrocephalus, mass lesion, or abnormal extra-axial fluid collection. No definite CT evidence for acute infarction. Diffuse loss of parenchymal volume is consistent with atrophy. Patchy low attenuation in the deep hemispheric and periventricular white matter is nonspecific, but likely reflects chronic microvascular ischemic demyelination. Globes are unremarkable. The visualized paranasal sinuses and mastoid air cells are clear. No evidence for skull fracture IMPRESSION: 1. No acute intracranial abnormality. 2. Atrophy with chronic small vessel white matter ischemic disease. Electronically Signed  By: Misty Stanley M.D.   On: 11/20/2015 20:03   Dg Hip Unilat With Pelvis 2-3 Views Left  11/20/2015  CLINICAL DATA:  Fall with left hip pain.  Initial encounter. EXAM: DG HIP (WITH OR WITHOUT PELVIS) 2-3V LEFT COMPARISON:  None. FINDINGS: Essentially single view imaging of the left hip which shows a basicervical left femoral neck fracture with medial impaction. No notable acetabular degenerative change. No evidence of pelvic ring fracture or diastasis. Osteopenia and atherosclerosis. IMPRESSION: Basicervical left femoral neck fracture. Electronically Signed   By: Monte Fantasia M.D.   On: 11/20/2015 20:21    ECG  Atrial fibrillation rate controlled.   ASSESSMENT AND PLAN  1. Preoperative clearance for left hip fracture:   - Patient has been living independently, he has no history of CAD. He denies ever having chest pain.   - Given his advanced age, his age alone would put him at fairly high risk. However he is stable  from cardiac perspective, he is asymptomatic for his age. Surgery will give him some degree of functional ability. Will discuss with MD to see if repeat echo would be any benefit in this case prior to surgery, although this is unlikely to change his risk.  2. Permanent atrial fibrillation on eliquis: Eliquis currently on hold. It is noted that patient has occasional slow A. fib with heart rate in the 40s to 50s, we'll cut Toprol-XL 25 mg daily down by half to 12.5 mg daily.  - despite initial CXR showing pulm edema, his lung is clear, he does not appears to be in acute HF    3. Hypothyroidism   Signed, Almyra Deforest, PA-C 11/21/2015, 5:23 PM  Patient seen and examined. Agree with assessment and plan. Ethan Faulkner is a 79-year-old gentleman who has remained fairly active and lives independently.  He has a history of permanent atrial fibrillation for which he was started on eloquence for anticoagulation therapy.  He denies any awareness of known history of heart attack denies any recent change in symptoms with reference to any development of chest pain or significant shortness of breath.  Unfortunately, he fallen and fractured his left femoral neck.  There is a need for surgery which is scheduled for tomorrow.  He continues to be in atrial fibrillation with a ventricular rate in the 60s, but on monitor.  He has had some periods of bradycardia and reportedly a 2.5 second pause.  This point, I would recommend reducing Toprol-XL from 25 mg to 12.5 mg.  His last dose of eliquis was yesterday a.m. and by tomorrow this should be cleared.  His ECG shows  atrial fibrillation and demonstrates  inferior Q waves in 3 and aVF and poor R-wave progression.  The patient denies any awareness of a prior heart attack.  On his prior echo, ejection fraction was 55-60% and he did not have any regional wall motion abnormalities.  On physical exam he does have a 1 to 2/6 aortic insufficiency murmur and on his echo he was found to have  mild to moderate aortic insufficiency with aortic valve sclerosis without aortic stenosis.  Was evidence for at least mild pulmonary hypertension with a PA pressure 42 mm.  Based on his relative asymptomatic status, although his risk of surgery is higher based on his age, I am giving clearance for his planned operative intervention tomorrow morning.  I will reduce his metoprolol dose in light of his bradycardia arrhythmia.  A postoperative ECG should be obtained and consider postoperative telemetry.  Troy Sine, MD, [facility] 11/21/2015 7:19 PM

## 2015-11-21 NOTE — Telephone Encounter (Signed)
Notified Ethan Faulkner that Dr. Nicki Reaper was aware that he broke his hip. Certainly sorry to hear that. Dr. Nicki Reaper hopes he does well with his surgery. It is very important for the family to talk with the discharge planning nurse tomorrow to let them know that they want him to come to the pain Center for rehabilitation. That way they can go ahead and get started on this request before the holiday kicks in next week. While at the [facility] he will be under the care of a doctor that the [facility] has and then once he is released from their he will follow-up with [place] as well. Certainly when he goes to the [facility] can drop by in say hello but do not have managing privileges there. Ethan Faulkner verbalized understanding and has already contacted [facility].

## 2015-11-21 NOTE — Telephone Encounter (Signed)
Please let the niece know that I was aware that he broke his hip. Certainly sorry to hear that. I hope he does well with his surgery. It is very important for the family to talk with the discharge planning nurse tomorrow to let them know that they want him to come to the pain Center for rehabilitation. That way they can go ahead and get started on this request before the holiday kicks in next week. While at the [facility] he will be under the care of a doctor that the [facility] has and then once he is released from their he will follow-up with [place] as well. Certainly when he goes to the [facility] I can drop by in say hello but I do not have managing privileges there.

## 2015-11-21 NOTE — H&P (Signed)

## 2015-11-21 NOTE — Progress Notes (Addendum)
Initial Nutrition Assessment  DOCUMENTATION CODES:   Not applicable  INTERVENTION:   Ensure Enlive po BID, each supplement provides 350 kcal and 20 grams of protein  NUTRITION DIAGNOSIS:   Increased nutrient needs related to  (hip fracture) as evidenced by estimated needs  GOAL:   Patient will meet greater than or equal to 90% of their needs  MONITOR:   PO intake, Supplement acceptance, Labs, Weight trends, I & O's  REASON FOR ASSESSMENT:   Consult Hip fracture protocol  ASSESSMENT:   82 yo Male with left hip pain. This after a mechanical fall. Patient thinks he tripped over something that was left in the floor. Pain to left hip unable to get up and walk on it. Has no prior history of orthopedic injuries; X-ray showed L femoral fracture.   RD spoke with patient and family >> plan is for surgery tomorrow AM. Pt reports his appetite is good >> weight has been stable. Would benefit from oral nutrition supplements >> would like chocolate Ensure Enlive >> will order. Nutrition-Focused physical exam completed. Findings are no fat depletion, mild muscle depletion, and no edema.   Diet Order:  Diet regular Room service appropriate?: Yes; Fluid consistency:: Thin  Skin:  Reviewed, no issues  Last BM:  6/27  Height:   Ht Readings from Last 1 Encounters:  11/20/15 5\' 10"  (1.778 m)    Weight:   Wt Readings from Last 1 Encounters:  11/20/15 160 lb (72.576 kg)    Ideal Body Weight:  75.4 kg  BMI:  Body mass index is 22.96 kg/(m^2).  Estimated Nutritional Needs:   Kcal:  1700-1900  Protein:  80-90 gm  Fluid:  1.7-1.9 L  EDUCATION NEEDS:   No education needs identified at this time  Arthur Holms, RD, LDN Pager #: [phone] After-Hours Pager #: [phone]

## 2015-11-21 NOTE — Care Management Note (Signed)
Case Management Note  Patient Details  Name: Ethan Faulkner MRN: [id number] Date of Birth: 02/07/1916  Subjective/Objective:     82 yr old gentlman s/p fall with a left femur fracture.   Patient was independent and lives home alone prior to fall. Family and patient want him to go to the [facility]. Case manager forwarded to our Education officer, museum. Patient has 7 children that are very supportive. His daughter [name] may be reached at [phone].       Action/Plan: Patient will go to surgery on Friday, 11/21/15   Expected Discharge Date:                  Expected Discharge Plan:   [place]  In-House Referral:     Discharge planning Services     Post Acute Care Choice:    Choice offered to:     DME Arranged:    DME Agency:     HH Arranged:   NA HH Agency:   NA  Status of Service:    Completed If discussed at [place] of Stay Meetings, dates discussed:    Additional Comments:  Ninfa Meeker, RN 11/21/2015, 11:16 AM

## 2015-11-21 NOTE — Progress Notes (Signed)
PROGRESS NOTE    Ethan Faulkner  [id number] DOB: 1942/12/17 DOA: 11/20/2015 PCP: Sallee Lange, MD  Outpatient Specialists:   Brief Narrative: 82 y.o. male with medical history significant of Prostate cancer 14 years ago, a renal mass that is being managed non-surgically due to slow growth and patients age, A.Fib on eliquis. Patient presents to the ED after a mechanical fall at home PTA. Resulting left hip pain, worse with movement, associated inability to bear weight. No syncope, no hitting head. Lives alone at home, fall was secondary to a trip, used life-alert to get EMS. Afib with heart rate in the 40-50's with some pauses, Cardiology consulted. Assessment & Plan:   Principal Problem:   Femoral neck fracture (HCC) Active Problems:   Atrial fibrillation (HCC)   Long-term (current) use of anticoagulants   Renal malignant tumor (HCC)  Assessment/Plan Principal Problem:  Femoral neck fracture (HCC) Active Problems:  Atrial fibrillation (HCC)  Long-term (current) use of anticoagulants  Renal malignant tumor (HCC)   Femoral neck fx of left hip - For surgery in am. Cardiology consulted.  A.Fib - Low heart rate. On Metoprolol XL 25mg  once daily. Pauses reported. Cardiology consulted (likely decrease dose of Metoprolol).Holding eliquis for surgery  H/o Renal malignant tumor - surgery not recommended due to age   DVT prophylaxis: SCDs, resume eliquis when ok with ortho Code Status: Full Family Communication: Family at bedside Consults called: Ortho Admission status: Admit to inpatient  Subjective: No new complaints. No syncope. No chest pain. No SOB. Fairly active at home for age prior to mechanical fall.  Objective: Filed Vitals:   11/20/15 2134 11/20/15 2240 11/20/15 2330 11/21/15 0532  BP: 164/110 141/65 134/69 140/67  Pulse: 79   97  Temp: 98.2 F (36.8 C)   97.5 F (36.4 C)  TempSrc: Oral   Oral  Resp: 18   17  Height:      Weight:        SpO2: 98%   97%   No intake or output data in the 24 hours ending 11/21/15 0752 Filed Weights   11/20/15 1902  Weight: 72.576 kg (160 lb)    Examination:  General exam: Appears calm and comfortable  Respiratory system: Clear to auscultation. Respiratory effort normal. Cardiovascular system: S1 & S2, irregular, bradycardic.   Gastrointestinal system: Abdomen is nondistended, soft and nontender. Central nervous system: Alert and oriented. Moves all limbs.  Extremities: No leg edema.  Data Reviewed: I have personally reviewed following labs and imaging studies  CBC:  Recent Labs Lab 11/20/15 1920  WBC 4.6  NEUTROABS 2.7  HGB 14.3  HCT 42.9  MCV 91.3  PLT A999333*   Basic Metabolic Panel:  Recent Labs Lab 11/20/15 1920  NA 136  K 4.1  CL 106  CO2 23  GLUCOSE 121*  BUN 28*  CREATININE 1.05  CALCIUM 9.0   GFR: Estimated Creatinine Clearance: 41.3 mL/min (by C-G formula based on Cr of 1.05). Liver Function Tests: No results for input(s): AST, ALT, ALKPHOS, BILITOT, PROT, ALBUMIN in the last 168 hours. No results for input(s): LIPASE, AMYLASE in the last 168 hours. No results for input(s): AMMONIA in the last 168 hours. Coagulation Profile:  Recent Labs Lab 11/21/15 0540  INR 1.28   Cardiac Enzymes: No results for input(s): CKTOTAL, CKMB, CKMBINDEX, TROPONINI in the last 168 hours. BNP (last 3 results) No results for input(s): PROBNP in the last 8760 hours. HbA1C: No results for input(s): HGBA1C in the last 72 hours. CBG:  No results for input(s): GLUCAP in the last 168 hours. Lipid Profile: No results for input(s): CHOL, HDL, LDLCALC, TRIG, CHOLHDL, LDLDIRECT in the last 72 hours. Thyroid Function Tests: No results for input(s): TSH, T4TOTAL, FREET4, T3FREE, THYROIDAB in the last 72 hours. Anemia Panel: No results for input(s): VITAMINB12, FOLATE, FERRITIN, TIBC, IRON, RETICCTPCT in the last 72 hours. Urine analysis:    Component Value Date/Time    UROBILINOGEN 2.0 05/28/2015 1608   LEUKOCYTESUR moderate (2+) 01/25/2014 1105   Sepsis Labs: @LABRCNTIP (procalcitonin:4,lacticidven:4)  ) Recent Results (from the past 240 hour(s))  Surgical pcr screen     Status: None   Collection Time: 11/21/15  2:55 AM  Result Value Ref Range Status   MRSA, PCR NEGATIVE NEGATIVE Final   Staphylococcus aureus NEGATIVE NEGATIVE Final    Comment:        The Xpert SA Assay (FDA approved for NASAL specimens in patients over 73 years of age), is one component of a comprehensive surveillance program.  Test performance has been validated by [facility] for patients greater than or equal to 60 year old. It is not intended to diagnose infection nor to guide or monitor treatment.          Radiology Studies: Dg Chest 1 View  11/20/2015  CLINICAL DATA:  Status post fall with left hip pain. History of prostate cancer. EXAM: CHEST 1 VIEW COMPARISON:  None. FINDINGS: The mediastinal contour is normal. The aorta is tortuous. The heart size is enlarged. There is pulmonary edema. There is no focal pneumonia or pleural effusion. There is scoliosis of spine. IMPRESSION: Pulmonary edema.  Cardiomegaly. Electronically Signed   By: Abelardo Diesel M.D.   On: 11/20/2015 20:22   Ct Head Wo Contrast  11/20/2015  CLINICAL DATA:  Fall. EXAM: CT HEAD WITHOUT CONTRAST TECHNIQUE: Contiguous axial images were obtained from the base of the skull through the vertex without intravenous contrast. COMPARISON:  02/07/2008 FINDINGS: There is no evidence for acute hemorrhage, hydrocephalus, mass lesion, or abnormal extra-axial fluid collection. No definite CT evidence for acute infarction. Diffuse loss of parenchymal volume is consistent with atrophy. Patchy low attenuation in the deep hemispheric and periventricular white matter is nonspecific, but likely reflects chronic microvascular ischemic demyelination. Globes are unremarkable. The visualized paranasal sinuses and mastoid air  cells are clear. No evidence for skull fracture IMPRESSION: 1. No acute intracranial abnormality. 2. Atrophy with chronic small vessel white matter ischemic disease. Electronically Signed   By: Misty Stanley M.D.   On: 11/20/2015 20:03   Dg Hip Unilat With Pelvis 2-3 Views Left  11/20/2015  CLINICAL DATA:  Fall with left hip pain.  Initial encounter. EXAM: DG HIP (WITH OR WITHOUT PELVIS) 2-3V LEFT COMPARISON:  None. FINDINGS: Essentially single view imaging of the left hip which shows a basicervical left femoral neck fracture with medial impaction. No notable acetabular degenerative change. No evidence of pelvic ring fracture or diastasis. Osteopenia and atherosclerosis. IMPRESSION: Basicervical left femoral neck fracture. Electronically Signed   By: Monte Fantasia M.D.   On: 11/20/2015 20:21        Scheduled Meds: . levothyroxine  25 mcg Oral QAC breakfast  . loratadine  10 mg Oral Daily  . metoprolol succinate  25 mg Oral Daily  . polyethylene glycol  17 g Oral Daily   Continuous Infusions:    LOS: 1 day    Time spent: 25 Mins    Dana Allan, MD  [facility] Pager #: [phone] 7PM-7AM contact night  coverage as above

## 2015-11-21 NOTE — Care Management Important Message (Signed)
Important Message  Patient Details  Name: Ethan Faulkner MRN: [id number] Date of Birth: Oct 15, 1915   Medicare Important Message Given:  Yes    Loann Quill 11/21/2015, 9:55 AM

## 2015-11-21 NOTE — Progress Notes (Signed)
Consents went over with pt and sign with family members by his side,

## 2015-11-21 NOTE — Clinical Social Work Note (Signed)
Clinical Social Work Assessment  Patient Details  Name: Ethan Faulkner MRN: [id number] Date of Birth: [birth year]  Date of referral:  11/21/15               Reason for consult:  Facility Placement, Discharge Planning                Permission sought to share information with:  Facility Sport and exercise psychologist, Family Supports Permission granted to share information::  Yes, Verbal Permission Granted  Name::     Interior and spatial designer::  [place]  Relationship::  Daughter  Contact Information:  [phone]  Housing/Transportation Living arrangements for the past 2 months:  [place] of Information:  Adult Children Patient Interpreter Needed:  None Criminal Activity/Legal Involvement Pertinent to Current Situation/Hospitalization:  No - Comment as needed Significant Relationships:  Adult Children Lives with:  Self Do you feel safe going back to the place where you live?  No Need for family participation in patient care:  Yes (Comment) (Patient's seven children active in patient's care.)  Care giving concerns:  Patient's daughter expressed no concerns at this time.   Social Worker assessment / plan:  LCSW received referral for possible SNF placement at time of discharge. LCSW spoke with patient's daughter, Ethan Faulkner, who stated that patient's family would prefer for patient to be discharged to [facility] once stable for discharge. Patient planned for surgery on 6/30. LCSW to continue to follow and assist with discharge planning needs.  Employment status:  Retired Forensic scientist:  Medicare PT Recommendations:  [place] / Referral to community resources:  [place]  Patient/Family's Response to care:  Patient's daughter understanding and agreeable to [facility] plan of care.  Patient/Family's Understanding of and Emotional Response to Diagnosis, Current Treatment, and Prognosis:  Patient's daughter  understanding and agreeable to LCSW plan of care.  Emotional Assessment Appearance:  Other (Comment Required (LCSW spoke with patient's daughter.) Attitude/Demeanor/Rapport:  Other (LCSW spoke with patient's daughter.) Affect (typically observed):  Other (LCSW spoke with patient's daughter.) Orientation:  Oriented to Self Alcohol / Substance use:  Not Applicable Psych involvement (Current and /or in the community):  No (Comment) (Not appropriate on this admission.)  Discharge Needs  Concerns to be addressed:  No discharge needs identified Readmission within the last 30 days:  No Current discharge risk:  None Barriers to Discharge:  No Barriers Identified   Caroline Sauger, LCSW 11/21/2015, 11:19 AM [phone]

## 2015-11-22 ENCOUNTER — Inpatient Hospital Stay (HOSPITAL_COMMUNITY): Payer: Medicare Other | Admitting: Certified Registered"

## 2015-11-22 ENCOUNTER — Telehealth: Payer: Self-pay | Admitting: Family Medicine

## 2015-11-22 ENCOUNTER — Encounter (HOSPITAL_COMMUNITY)
Admission: EM | Disposition: A | Discharge status: Skilled Nursing Facility | Payer: Self-pay | Source: Home / Self Care | Attending: Internal Medicine

## 2015-11-22 ENCOUNTER — Inpatient Hospital Stay (HOSPITAL_COMMUNITY): Payer: Medicare Other

## 2015-11-22 HISTORY — PX: TOTAL HIP ARTHROPLASTY: SHX124

## 2015-11-22 LAB — URINALYSIS, ROUTINE W REFLEX MICROSCOPIC
Bilirubin Urine: NEGATIVE
Glucose, UA: NEGATIVE mg/dL
Hgb urine dipstick: NEGATIVE
Ketones, ur: NEGATIVE mg/dL
Leukocytes, UA: NEGATIVE
Nitrite: NEGATIVE
Protein, ur: NEGATIVE mg/dL
Specific Gravity, Urine: 1.02 (ref 1.005–1.030)
pH: 5.5 (ref 5.0–8.0)

## 2015-11-22 LAB — CBC
HCT: 39.6 % (ref 39.0–52.0)
Hemoglobin: 13.1 g/dL (ref 13.0–17.0)
MCH: 30.9 pg (ref 26.0–34.0)
MCHC: 33.1 g/dL (ref 30.0–36.0)
MCV: 93.4 fL (ref 78.0–100.0)
PLATELETS: 115 10*3/uL — AB (ref 150–400)
RBC: 4.24 MIL/uL (ref 4.22–5.81)
RDW: 13.3 % (ref 11.5–15.5)
WBC: 6.4 10*3/uL (ref 4.0–10.5)

## 2015-11-22 LAB — CREATININE, SERUM
Creatinine, Ser: 1 mg/dL (ref 0.61–1.24)
GFR calc non Af Amer: >60 mL/min (ref >60)

## 2015-11-22 SURGERY — ARTHROPLASTY, HIP, TOTAL, ANTERIOR APPROACH
Anesthesia: General | Laterality: Left

## 2015-11-22 MED ORDER — OXYCODONE HCL 5 MG PO TABS: 5.0000 mg | ORAL_TABLET | ORAL | Status: DC | PRN

## 2015-11-22 MED ORDER — PROPOFOL 10 MG/ML IV BOLUS
INTRAVENOUS | Status: DC | PRN
  Administered 2015-11-22: 150 mg via INTRAVENOUS

## 2015-11-22 MED ORDER — SUCCINYLCHOLINE CHLORIDE 200 MG/10ML IV SOSY
PREFILLED_SYRINGE | INTRAVENOUS | Status: AC
  Filled 2015-11-22: qty 10

## 2015-11-22 MED ORDER — NORMAL SALINE FLUSH 0.9 % IV SOLN
INTRAVENOUS | Status: DC | PRN
  Administered 2015-11-22 (×4): 10 mL via TOPICAL

## 2015-11-22 MED ORDER — HYDROCODONE-ACETAMINOPHEN 5-325 MG PO TABS: 1.0000 | ORAL_TABLET | Freq: Four times a day (QID) | ORAL | Status: DC | PRN

## 2015-11-22 MED ORDER — BUPIVACAINE LIPOSOME 1.3 % IJ SUSP
20.0000 mL | INTRAMUSCULAR | Status: AC
  Administered 2015-11-22: 20 mL
  Filled 2015-11-22: qty 20

## 2015-11-22 MED ORDER — HYDROCODONE-ACETAMINOPHEN 7.5-325 MG PO TABS: 1.0000 | ORAL_TABLET | Freq: Four times a day (QID) | ORAL | Status: DC | PRN

## 2015-11-22 MED ORDER — LIDOCAINE 2% (20 MG/ML) 5 ML SYRINGE
INTRAMUSCULAR | Status: AC
  Filled 2015-11-22: qty 5

## 2015-11-22 MED ORDER — SODIUM CHLORIDE 0.9 % IR SOLN
Status: DC | PRN
  Administered 2015-11-22: 1000 mL
  Administered 2015-11-22: 3000 mL

## 2015-11-22 MED ORDER — PROMETHAZINE HCL 25 MG/ML IJ SOLN
INTRAMUSCULAR | Status: AC
  Filled 2015-11-22: qty 1

## 2015-11-22 MED ORDER — METHOCARBAMOL 1000 MG/10ML IJ SOLN
500.0000 mg | Freq: Four times a day (QID) | INTRAVENOUS | Status: DC | PRN
  Filled 2015-11-22: qty 5

## 2015-11-22 MED ORDER — ROCURONIUM BROMIDE 50 MG/5ML IV SOLN
INTRAVENOUS | Status: AC
  Filled 2015-11-22: qty 1

## 2015-11-22 MED ORDER — 0.9 % SODIUM CHLORIDE (POUR BTL) OPTIME
TOPICAL | Status: DC | PRN
  Administered 2015-11-22: 1000 mL

## 2015-11-22 MED ORDER — SODIUM CHLORIDE 0.9 % IV SOLN
INTRAVENOUS | Status: DC
  Administered 2015-11-22: 15:00:00 via INTRAVENOUS

## 2015-11-22 MED ORDER — LACTATED RINGERS IV SOLN
INTRAVENOUS | Status: DC
  Administered 2015-11-22 (×2): via INTRAVENOUS

## 2015-11-22 MED ORDER — FENTANYL CITRATE (PF) 250 MCG/5ML IJ SOLN
INTRAMUSCULAR | Status: DC | PRN
  Administered 2015-11-22 (×2): 50 ug via INTRAVENOUS
  Administered 2015-11-22: 150 ug via INTRAVENOUS

## 2015-11-22 MED ORDER — ALUM & MAG HYDROXIDE-SIMETH 200-200-20 MG/5ML PO SUSP: 30.0000 mL | ORAL | Status: DC | PRN

## 2015-11-22 MED ORDER — PROMETHAZINE HCL 25 MG/ML IJ SOLN
6.2500 mg | INTRAMUSCULAR | Status: DC | PRN
  Administered 2015-11-22: 6.25 mg via INTRAVENOUS

## 2015-11-22 MED ORDER — FENTANYL CITRATE (PF) 100 MCG/2ML IJ SOLN
25.0000 ug | INTRAMUSCULAR | Status: DC | PRN
  Administered 2015-11-22: 25 ug via INTRAVENOUS

## 2015-11-22 MED ORDER — FENTANYL CITRATE (PF) 100 MCG/2ML IJ SOLN
INTRAMUSCULAR | Status: AC
  Filled 2015-11-22: qty 2

## 2015-11-22 MED ORDER — PHENOL 1.4 % MT LIQD: 1.0000 | OROMUCOSAL | Status: DC | PRN

## 2015-11-22 MED ORDER — METHOCARBAMOL 500 MG PO TABS: 500.0000 mg | ORAL_TABLET | Freq: Four times a day (QID) | ORAL | Status: DC | PRN

## 2015-11-22 MED ORDER — PHENYLEPHRINE 40 MCG/ML (10ML) SYRINGE FOR IV PUSH (FOR BLOOD PRESSURE SUPPORT)
PREFILLED_SYRINGE | INTRAVENOUS | Status: AC
  Filled 2015-11-22: qty 10

## 2015-11-22 MED ORDER — ONDANSETRON HCL 4 MG PO TABS: 4.0000 mg | ORAL_TABLET | Freq: Four times a day (QID) | ORAL | Status: DC | PRN

## 2015-11-22 MED ORDER — APIXABAN 5 MG PO TABS: 5.0000 mg | ORAL_TABLET | Freq: Two times a day (BID) | ORAL | Status: DC

## 2015-11-22 MED ORDER — ACETAMINOPHEN 325 MG PO TABS
650.0000 mg | ORAL_TABLET | Freq: Four times a day (QID) | ORAL | Status: DC | PRN
  Administered 2015-11-22 – 2015-11-25 (×6): 650 mg via ORAL
  Filled 2015-11-22 (×6): qty 2

## 2015-11-22 MED ORDER — MENTHOL 3 MG MT LOZG: 1.0000 | LOZENGE | OROMUCOSAL | Status: DC | PRN

## 2015-11-22 MED ORDER — APIXABAN 5 MG PO TABS
5.0000 mg | ORAL_TABLET | Freq: Two times a day (BID) | ORAL | Status: DC
  Administered 2015-11-23 – 2015-11-25 (×5): 5 mg via ORAL
  Filled 2015-11-22 (×5): qty 1

## 2015-11-22 MED ORDER — SUGAMMADEX SODIUM 200 MG/2ML IV SOLN
INTRAVENOUS | Status: DC | PRN
  Administered 2015-11-22: 150 mg via INTRAVENOUS

## 2015-11-22 MED ORDER — PHENYLEPHRINE HCL 10 MG/ML IJ SOLN
INTRAMUSCULAR | Status: DC | PRN
  Administered 2015-11-22: 120 ug via INTRAVENOUS
  Administered 2015-11-22 (×3): 80 ug via INTRAVENOUS

## 2015-11-22 MED ORDER — ROCURONIUM BROMIDE 100 MG/10ML IV SOLN
INTRAVENOUS | Status: DC | PRN
  Administered 2015-11-22: 30 mg via INTRAVENOUS
  Administered 2015-11-22 (×2): 10 mg via INTRAVENOUS

## 2015-11-22 MED ORDER — ACETAMINOPHEN 325 MG PO TABS
650.0000 mg | ORAL_TABLET | ORAL | Status: DC | PRN
  Administered 2015-11-22: 650 mg via ORAL
  Filled 2015-11-22: qty 2

## 2015-11-22 MED ORDER — ONDANSETRON HCL 4 MG/2ML IJ SOLN
4.0000 mg | Freq: Four times a day (QID) | INTRAMUSCULAR | Status: DC | PRN
  Administered 2015-11-22: 4 mg via INTRAVENOUS
  Filled 2015-11-22: qty 2

## 2015-11-22 MED ORDER — SODIUM CHLORIDE 0.9 % IV SOLN
1000.0000 mg | INTRAVENOUS | Status: AC
  Administered 2015-11-22: 1000 mg via INTRAVENOUS
  Filled 2015-11-22: qty 10

## 2015-11-22 MED ORDER — METOCLOPRAMIDE HCL 5 MG PO TABS: 5.0000 mg | ORAL_TABLET | Freq: Three times a day (TID) | ORAL | Status: DC | PRN

## 2015-11-22 MED ORDER — CEFAZOLIN SODIUM-DEXTROSE 2-3 GM-% IV SOLR
INTRAVENOUS | Status: DC | PRN
  Administered 2015-11-22: 2 g via INTRAVENOUS

## 2015-11-22 MED ORDER — CEFAZOLIN SODIUM-DEXTROSE 2-4 GM/100ML-% IV SOLN
2.0000 g | Freq: Four times a day (QID) | INTRAVENOUS | Status: AC
Start: 2015-11-22 — End: 2015-11-23
  Administered 2015-11-22 – 2015-11-23 (×3): 2 g via INTRAVENOUS
  Filled 2015-11-22 (×3): qty 100

## 2015-11-22 MED ORDER — MEPERIDINE HCL 25 MG/ML IJ SOLN: 6.2500 mg | INTRAMUSCULAR | Status: DC | PRN

## 2015-11-22 MED ORDER — MORPHINE SULFATE (PF) 2 MG/ML IV SOLN: 0.5000 mg | INTRAVENOUS | Status: DC | PRN

## 2015-11-22 MED ORDER — FENTANYL CITRATE (PF) 250 MCG/5ML IJ SOLN
INTRAMUSCULAR | Status: AC
  Filled 2015-11-22: qty 5

## 2015-11-22 MED ORDER — ONDANSETRON HCL 4 MG/2ML IJ SOLN: 4.0000 mg | Freq: Four times a day (QID) | INTRAMUSCULAR | Status: DC | PRN

## 2015-11-22 MED ORDER — ONDANSETRON HCL 4 MG/2ML IJ SOLN
INTRAMUSCULAR | Status: DC | PRN
Start: 2015-11-22 — End: 2015-11-22
  Administered 2015-11-22: 4 mg via INTRAVENOUS

## 2015-11-22 MED ORDER — EPHEDRINE SULFATE 50 MG/ML IJ SOLN
INTRAMUSCULAR | Status: DC | PRN
  Administered 2015-11-22: 10 mg via INTRAVENOUS

## 2015-11-22 MED ORDER — EPHEDRINE 5 MG/ML INJ
INTRAVENOUS | Status: AC
  Filled 2015-11-22: qty 10

## 2015-11-22 MED ORDER — METOCLOPRAMIDE HCL 5 MG/ML IJ SOLN: 5.0000 mg | Freq: Three times a day (TID) | INTRAMUSCULAR | Status: DC | PRN

## 2015-11-22 MED ORDER — PROPOFOL 10 MG/ML IV BOLUS
INTRAVENOUS | Status: AC
  Filled 2015-11-22: qty 20

## 2015-11-22 MED ORDER — ACETAMINOPHEN 650 MG RE SUPP: 650.0000 mg | Freq: Four times a day (QID) | RECTAL | Status: DC | PRN

## 2015-11-22 MED ORDER — ENOXAPARIN SODIUM 40 MG/0.4ML ~~LOC~~ SOLN: 40.0000 mg | SUBCUTANEOUS | Status: DC

## 2015-11-22 MED ORDER — TRANEXAMIC ACID 1000 MG/10ML IV SOLN
2000.0000 mg | INTRAVENOUS | Status: AC
  Administered 2015-11-22: 2000 mg via TOPICAL
  Filled 2015-11-22: qty 20

## 2015-11-22 SURGICAL SUPPLY — 51 items
BAG DECANTER FOR FLEXI CONT (MISCELLANEOUS) ×3 IMPLANT
CAPT HIP HEMI 2 ×2 IMPLANT
CELLS DAT CNTRL 66122 CELL SVR (MISCELLANEOUS) ×1 IMPLANT
COVER SURGICAL LIGHT HANDLE (MISCELLANEOUS) ×3 IMPLANT
DRAPE C-ARM 42X72 X-RAY (DRAPES) ×3 IMPLANT
DRAPE STERI IOBAN 125X83 (DRAPES) ×3 IMPLANT
DRAPE U-SHAPE 47X51 STRL (DRAPES) ×9 IMPLANT
DRSG AQUACEL AG ADV 3.5X10 (GAUZE/BANDAGES/DRESSINGS) ×3 IMPLANT
DRSG MEPILEX BORDER 4X8 (GAUZE/BANDAGES/DRESSINGS) ×2 IMPLANT
DURAPREP 26ML APPLICATOR (WOUND CARE) ×3 IMPLANT
ELECT BLADE 4.0 EZ CLEAN MEGAD (MISCELLANEOUS) ×3
ELECT REM PT RETURN 9FT ADLT (ELECTROSURGICAL) ×3
ELECTRODE BLDE 4.0 EZ CLN MEGD (MISCELLANEOUS) ×1 IMPLANT
ELECTRODE REM PT RTRN 9FT ADLT (ELECTROSURGICAL) ×1 IMPLANT
GLOVE SKINSENSE NS SZ7.5 (GLOVE) ×2
GLOVE SKINSENSE STRL SZ7.5 (GLOVE) ×1 IMPLANT
GLOVE SURG SYN 7.5  E (GLOVE) ×4
GLOVE SURG SYN 7.5 E (GLOVE) ×2 IMPLANT
GLOVE SURG SYN 7.5 PF PI (GLOVE) ×2 IMPLANT
GOWN SRG XL XLNG 56XLVL 4 (GOWN DISPOSABLE) ×1 IMPLANT
GOWN STRL NON-REIN XL XLG LVL4 (GOWN DISPOSABLE) ×3
GOWN STRL REUS W/ TWL LRG LVL3 (GOWN DISPOSABLE) IMPLANT
GOWN STRL REUS W/TWL LRG LVL3 (GOWN DISPOSABLE)
HANDPIECE INTERPULSE COAX TIP (DISPOSABLE) ×3
HOOD PEEL AWAY FLYTE STAYCOOL (MISCELLANEOUS) ×6 IMPLANT
IV NS 1000ML (IV SOLUTION) ×3
IV NS 1000ML BAXH (IV SOLUTION) ×1 IMPLANT
IV NS IRRIG 3000ML ARTHROMATIC (IV SOLUTION) ×3 IMPLANT
KIT BASIN OR (CUSTOM PROCEDURE TRAY) ×3 IMPLANT
MARKER SKIN DUAL TIP RULER LAB (MISCELLANEOUS) ×3 IMPLANT
NDL SPNL 18GX3.5 QUINCKE PK (NEEDLE) ×1 IMPLANT
NEEDLE SPNL 18GX3.5 QUINCKE PK (NEEDLE) ×3 IMPLANT
PACK TOTAL JOINT (CUSTOM PROCEDURE TRAY) ×3 IMPLANT
PACK UNIVERSAL I (CUSTOM PROCEDURE TRAY) ×3 IMPLANT
RETRACTOR WND ALEXIS 18 MED (MISCELLANEOUS) ×1 IMPLANT
RTRCTR WOUND ALEXIS 18CM MED (MISCELLANEOUS) ×3
SAW OSC TIP CART 19.5X105X1.3 (SAW) ×3 IMPLANT
SEALER BIPOLAR AQUA 6.0 (INSTRUMENTS) ×3 IMPLANT
SET HNDPC FAN SPRY TIP SCT (DISPOSABLE) ×1 IMPLANT
STAPLER VISISTAT 35W (STAPLE) ×2 IMPLANT
SUT ETHIBOND 2 V 37 (SUTURE) ×3 IMPLANT
SUT ETHIBOND NAB CT1 #1 30IN (SUTURE) ×9 IMPLANT
SUT VIC AB 1 CT1 27 (SUTURE) ×3
SUT VIC AB 1 CT1 27XBRD ANBCTR (SUTURE) ×1 IMPLANT
SUT VIC AB 2-0 CT1 27 (SUTURE) ×3
SUT VIC AB 2-0 CT1 TAPERPNT 27 (SUTURE) ×1 IMPLANT
SYR 20CC LL (SYRINGE) ×3 IMPLANT
SYR 50ML LL SCALE MARK (SYRINGE) ×3 IMPLANT
TOWEL OR 17X26 10 PK STRL BLUE (TOWEL DISPOSABLE) ×3 IMPLANT
TRAY CATH 16FR W/PLASTIC CATH (SET/KITS/TRAYS/PACK) ×3 IMPLANT
YANKAUER SUCT BULB TIP NO VENT (SUCTIONS) ×3 IMPLANT

## 2015-11-22 NOTE — Transfer of Care (Signed)
Immediate Anesthesia Transfer of Care Note  Patient: Ethan Faulkner  Procedure(s) Performed: Procedure(s): LEFT HIP HEMIARTHROPLASTY  (Left)  Patient Location: PACU  Anesthesia Type:General  Level of Consciousness: awake, alert  and patient cooperative  Airway & Oxygen Therapy: Patient Spontanous Breathing and Patient connected to nasal cannula oxygen  Post-op Assessment: Report given to RN, Post -op Vital signs reviewed and stable and Patient moving all extremities  Post vital signs: Reviewed and stable  Last Vitals:  Filed Vitals:   11/22/15 0732 11/22/15 0740  BP:  157/96  Pulse: 87   Temp:    Resp: 18     Last Pain:  Filed Vitals:   11/22/15 0740  PainSc: Asleep      Patients Stated Pain Goal: 2 ([id number])  Complications: No apparent anesthesia complications

## 2015-11-22 NOTE — Discharge Instructions (Signed)
° ° °  1. Change dressings as needed 2. May shower but keep incisions covered and dry 3. Take eliquis to prevent blood clots 4. Take stool softeners as needed 5. Take pain meds as needed ________________________________________________________  Information on my medicine - ELIQUIS (apixaban)  This medication education was reviewed with me or my healthcare representative as part of my discharge preparation.  The pharmacist that spoke with me during my hospital stay was:  Estelle June, [facility]  Why was Eliquis prescribed for you? Eliquis was prescribed for you to reduce the risk of blood clots forming after orthopedic surgery.    What do You need to know about Eliquis? Take your Eliquis TWICE DAILY - one tablet in the morning and one tablet in the evening with or without food.  It would be best to take the dose about the same time each day.  If you have difficulty swallowing the tablet whole please discuss with your pharmacist how to take the medication safely.  Take Eliquis exactly as prescribed by your doctor and DO NOT stop taking Eliquis without talking to the doctor who prescribed the medication.  Stopping without other medication to take the place of Eliquis may increase your risk of developing a clot.  After discharge, you should have regular check-up appointments with your healthcare provider that is prescribing your Eliquis.  What do you do if you miss a dose? If a dose of ELIQUIS is not taken at the scheduled time, take it as soon as possible on the same day and twice-daily administration should be resumed.  The dose should not be doubled to make up for a missed dose.  Do not take more than one tablet of ELIQUIS at the same time.  Important Safety Information A possible side effect of Eliquis is bleeding. You should call your healthcare provider right away if you experience any of the following: ? Bleeding from an injury or your nose that does not stop. ? Unusual  colored urine (red or dark brown) or unusual colored stools (red or black). ? Unusual bruising for unknown reasons. ? A serious fall or if you hit your head (even if there is no bleeding).  Some medicines may interact with Eliquis and might increase your risk of bleeding or clotting while on Eliquis. To help avoid this, consult your healthcare provider or pharmacist prior to using any new prescription or non-prescription medications, including herbals, vitamins, non-steroidal anti-inflammatory drugs (NSAIDs) and supplements.  This website has more information on Eliquis (apixaban): http://www.eliquis.com/eliquis/home

## 2015-11-22 NOTE — Progress Notes (Signed)
PROGRESS NOTE    Ethan Faulkner  [id number] DOB: Nov 18, 1915 DOA: 11/20/2015 PCP: Sallee Lange, MD  Outpatient Specialists:   Brief Narrative: 82 y.o. male with medical history significant of Prostate cancer 14 years ago, a renal mass that is being managed non-surgically due to slow growth and patients age, A.Fib on eliquis. Patient presents to the ED after a mechanical fall at home PTA. Resulting left hip pain, worse with movement, associated inability to bear weight. No syncope, no hitting head. Lives alone at home, fall was secondary to a trip, used life-alert to get EMS. Afib with heart rate in the 40-50's with some pauses, Cardiology consulted. Assessment & Plan:   Principal Problem:   Femoral neck fracture (HCC) Active Problems:   Atrial fibrillation (HCC)   Long-term (current) use of anticoagulants   Renal malignant tumor (HCC)  Assessment/Plan Principal Problem:  Femoral neck fracture (HCC)   Active Problems:  Atrial fibrillation (HCC)  Long-term (current) use of anticoagulants  Renal malignant tumor (HCC)   Femoral neck fx of left hip -  S/P left hemiarthroplasty. Seen at the PACU. Remains stable, with no complaints.  A.Fib - Low heart rate. On Metoprolol XL 12.5mg  once daily. Stable heart rate. Eliquis is on hold (Ortho to advise when to restart).  H/o Renal malignant tumor - surgery not recommended due to age   DVT prophylaxis: SCDs, resume eliquis when ok with ortho Code Status: Full Family Communication: Family at bedside Consults called: Ortho Admission status: Admit to inpatient  Subjective: No new complaints. No fever or chills. No SOB and no chest pain. Seen at the PACU (Post op)  Objective: Filed Vitals:   11/21/15 2037 11/22/15 0606 11/22/15 0732 11/22/15 0740  BP: 124/60 160/71  157/96  Pulse: 85 69 87   Temp: 98.6 F (37 C) 97.8 F (36.6 C)    TempSrc: Oral Oral    Resp: 18  18   Height:      Weight:      SpO2: 98%  98% 97%     Intake/Output Summary (Last 24 hours) at 11/22/15 0929 Last data filed at 11/22/15 [id number]  Gross per 24 hour  Intake  822.5 ml  Output    750 ml  Net   72.5 ml   Filed Weights   11/20/15 1902  Weight: 72.576 kg (160 lb)    Examination:  General exam: Appears calm and comfortable  Respiratory system: Clear to auscultation. Respiratory effort normal. Cardiovascular system: S1 & S2, irregular. Gastrointestinal system: Abdomen is nondistended, soft and nontender. Central nervous system: Alert and oriented. Moves all limbs.  Extremities: No leg edema.  Data Reviewed: I have personally reviewed following labs and imaging studies  CBC:  Recent Labs Lab 11/20/15 1920  WBC 4.6  NEUTROABS 2.7  HGB 14.3  HCT 42.9  MCV 91.3  PLT A999333*   Basic Metabolic Panel:  Recent Labs Lab 11/20/15 1920  NA 136  K 4.1  CL 106  CO2 23  GLUCOSE 121*  BUN 28*  CREATININE 1.05  CALCIUM 9.0   GFR: Estimated Creatinine Clearance: 41.3 mL/min (by C-G formula based on Cr of 1.05). Liver Function Tests: No results for input(s): AST, ALT, ALKPHOS, BILITOT, PROT, ALBUMIN in the last 168 hours. No results for input(s): LIPASE, AMYLASE in the last 168 hours. No results for input(s): AMMONIA in the last 168 hours. Coagulation Profile:  Recent Labs Lab 11/21/15 0540  INR 1.28   Cardiac Enzymes: No results for input(s): CKTOTAL, CKMB,  CKMBINDEX, TROPONINI in the last 168 hours. BNP (last 3 results) No results for input(s): PROBNP in the last 8760 hours. HbA1C: No results for input(s): HGBA1C in the last 72 hours. CBG: No results for input(s): GLUCAP in the last 168 hours. Lipid Profile: No results for input(s): CHOL, HDL, LDLCALC, TRIG, CHOLHDL, LDLDIRECT in the last 72 hours. Thyroid Function Tests: No results for input(s): TSH, T4TOTAL, FREET4, T3FREE, THYROIDAB in the last 72 hours. Anemia Panel: No results for input(s): VITAMINB12, FOLATE, FERRITIN, TIBC, IRON,  RETICCTPCT in the last 72 hours. Urine analysis:    Component Value Date/Time   COLORURINE YELLOW 11/22/2015 [place] 11/22/2015 0248   LABSPEC 1.020 11/22/2015 0248   PHURINE 5.5 11/22/2015 0248   GLUCOSEU NEGATIVE 11/22/2015 0248   HGBUR NEGATIVE 11/22/2015 0248   BILIRUBINUR NEGATIVE 11/22/2015 0248   KETONESUR NEGATIVE 11/22/2015 0248   PROTEINUR NEGATIVE 11/22/2015 0248   UROBILINOGEN 2.0 05/28/2015 1608   NITRITE NEGATIVE 11/22/2015 0248   LEUKOCYTESUR NEGATIVE 11/22/2015 0248   Sepsis Labs: @LABRCNTIP (procalcitonin:4,lacticidven:4)  ) Recent Results (from the past 240 hour(s))  Surgical pcr screen     Status: None   Collection Time: 11/21/15  2:55 AM  Result Value Ref Range Status   MRSA, PCR NEGATIVE NEGATIVE Final   Staphylococcus aureus NEGATIVE NEGATIVE Final    Comment:        The Xpert SA Assay (FDA approved for NASAL specimens in patients over 27 years of age), is one component of a comprehensive surveillance program.  Test performance has been validated by [facility] for patients greater than or equal to 81 year old. It is not intended to diagnose infection nor to guide or monitor treatment.          Radiology Studies: Dg Chest 1 View  11/20/2015  CLINICAL DATA:  Status post fall with left hip pain. History of prostate cancer. EXAM: CHEST 1 VIEW COMPARISON:  None. FINDINGS: The mediastinal contour is normal. The aorta is tortuous. The heart size is enlarged. There is pulmonary edema. There is no focal pneumonia or pleural effusion. There is scoliosis of spine. IMPRESSION: Pulmonary edema.  Cardiomegaly. Electronically Signed   By: Abelardo Diesel M.D.   On: 11/20/2015 20:22   Ct Head Wo Contrast  11/20/2015  CLINICAL DATA:  Fall. EXAM: CT HEAD WITHOUT CONTRAST TECHNIQUE: Contiguous axial images were obtained from the base of the skull through the vertex without intravenous contrast. COMPARISON:  02/07/2008 FINDINGS: There is no  evidence for acute hemorrhage, hydrocephalus, mass lesion, or abnormal extra-axial fluid collection. No definite CT evidence for acute infarction. Diffuse loss of parenchymal volume is consistent with atrophy. Patchy low attenuation in the deep hemispheric and periventricular white matter is nonspecific, but likely reflects chronic microvascular ischemic demyelination. Globes are unremarkable. The visualized paranasal sinuses and mastoid air cells are clear. No evidence for skull fracture IMPRESSION: 1. No acute intracranial abnormality. 2. Atrophy with chronic small vessel white matter ischemic disease. Electronically Signed   By: Misty Stanley M.D.   On: 11/20/2015 20:03   Dg Hip Unilat With Pelvis 2-3 Views Left  11/20/2015  CLINICAL DATA:  Fall with left hip pain.  Initial encounter. EXAM: DG HIP (WITH OR WITHOUT PELVIS) 2-3V LEFT COMPARISON:  None. FINDINGS: Essentially single view imaging of the left hip which shows a basicervical left femoral neck fracture with medial impaction. No notable acetabular degenerative change. No evidence of pelvic ring fracture or diastasis. Osteopenia and atherosclerosis. IMPRESSION: Basicervical left femoral  neck fracture. Electronically Signed   By: Monte Fantasia M.D.   On: 11/20/2015 20:21        Scheduled Meds: .  ceFAZolin (ANCEF) IV  2 g Intravenous To SS-Surg  . [MAR Hold] feeding supplement (ENSURE ENLIVE)  237 mL Oral BID BM  . [MAR Hold] levothyroxine  25 mcg Oral QAC breakfast  . [MAR Hold] loratadine  10 mg Oral Daily  . [MAR Hold] metoprolol succinate  12.5 mg Oral Daily  . [MAR Hold] polyethylene glycol  17 g Oral Daily  . povidone-iodine  2 application Topical Once   Continuous Infusions: . sodium chloride 125 mL/hr at 11/22/15 0237  . lactated ringers 10 mL/hr at 11/22/15 0743     LOS: 2 days    Time spent: 25 Mins    Dana Allan, MD  [facility] Pager #: [phone] 7PM-7AM contact night coverage as above

## 2015-11-22 NOTE — Anesthesia Postprocedure Evaluation (Signed)
Anesthesia Post Note  Patient: Ethan Faulkner  Procedure(s) Performed: Procedure(s) (LRB): LEFT HIP HEMIARTHROPLASTY  (Left)  Patient location during evaluation: PACU Anesthesia Type: General Level of consciousness: awake Pain management: pain level controlled Vital Signs Assessment: post-procedure vital signs reviewed and stable Respiratory status: spontaneous breathing Cardiovascular status: stable Postop Assessment: no signs of nausea or vomiting Anesthetic complications: no     Last Vitals:  Filed Vitals:   11/22/15 0740 11/22/15 1015  BP: 157/96 157/80  Pulse:  75  Temp:  36.7 C  Resp:  13    Last Pain:  Filed Vitals:   11/22/15 1036  PainSc: 0-No pain   Pain Goal: Patients Stated Pain Goal: 2 (11/22/15 0335)               Bethzy Hauck JR,JOHN Mateo [facility]

## 2015-11-22 NOTE — Op Note (Signed)
LEFT HIP HEMIARTHROPLASTY   Procedure Note Ethan Faulkner   [id number]  Pre-op Diagnosis: left femoral neck fracture     Post-op Diagnosis: same   Operative Procedures  1. Prosthetic replacement for femoral neck fracture. CPT [phone]  Personnel  Surgeon(s): Leandrew Koyanagi, MD   Anesthesia: general  Prosthesis: depuy Femur: corail KA 14 Head: 54 mm size: +1.5 Bearing Type: Bipolar  Date of Service: 11/20/2015 - 11/22/2015  Hip Hemiarthroplasty (Anterior Approach) Op Note:  After informed consent was obtained and the operative extremity marked in the holding area, the patient was brought back to the operating room and placed supine on the HANA table. Next, the operative extremity was prepped and draped in normal sterile fashion. Surgical timeout occurred verifying patient identification, surgical site, surgical procedure and administration of antibiotics.  A modified anterior Smith-Peterson approach to the hip was performed, using the interval between tensor fascia lata and sartorius.  Dissection was carried bluntly down onto the anterior hip capsule. The lateral femoral circumflex vessels were identified and coagulated. A capsulotomy was performed and the capsular flaps tagged for later repair.  Fluoroscopy was utilized to prepare for the femoral neck cut. The neck osteotomy was performed. The femoral head was removed and found a 54 mm head was the appropriate fit.    We then turned our attention to the femur.  After placing the femoral hook, the leg was taken to externally rotated, extended and adducted position taking care to perform soft tissue releases to allow for adequate mobilization of the femur. Soft tissue was cleared from the shoulder of the greater trochanter and the hook elevator used to improve exposure of the proximal femur. Sequential broaching performed up to a size 14. Trial neck and head were placed. The leg was brought back up to neutral and the construct reduced. The  position and sizing of components, offset and leg lengths were checked using fluoroscopy. Stability of the construct was checked in extension and external rotation without any subluxation or impingement of prosthesis. We dislocated the prosthesis, dropped the leg back into position, removed trial components, and irrigated copiously. The final stem and head was then placed, the leg brought back up, the system reduced and fluoroscopy used to verify positioning.  We irrigated, obtained hemostasis and closed the capsule using #2 ethibond suture.  The fascia was closed with #1 vicryl plus, the deep fat layer was closed with 0 vicryl, the subcutaneous layers closed with 2.0 Vicryl Plus and the skin closed with staples. A sterile dressing was applied. The patient was awakened in the operating room and taken to recovery in stable condition. All sponge, needle, and instrument counts were correct at the end of the case.   Position: supine  Complications: none.  Time Out: performed   Drains/Packing: none  Estimated blood loss: 300 cc  Returned to Recovery Room: in good condition.   Antibiotics: yes   Mechanical VTE (DVT) Prophylaxis: sequential compression devices, TED thigh-high  Chemical VTE (DVT) Prophylaxis: eliquis  Fluid Replacement: Crystalloid: see anesthesia record  Specimens Removed: 1 to pathology   Sponge and Instrument Count Correct? yes   PACU: portable radiograph - low AP   Admission: inpatient status, start PT & OT POD#1  Plan/RTC: Return in 2 weeks for staple removal. Return in 6 weeks to see MD.  Weight Bearing/Load Lower Extremity: full  Hip precautions: none Suture Removal: 10-14 days  Betadine to incision twice daily once dressing is removed on POD#7  N. Eduard Roux,  MD [place] 10:00 AM     Implant Name Type Inv. Item Serial No. Manufacturer Lot No. LRB No. Used  STEM CORAIL KA14 - [id number] Stem STEM CORAIL KA14  DEPUY [id number] Left 1    BIPOLAR PROS AML 54MM - [id number] Hips BIPOLAR PROS AML 54MM  DEPUY [id number] Left 1  HIP BALL ARTICU DEPUY - [id number] Hips HIP BALL ARTICU DEPUY   DEPUY [id number] Left 1

## 2015-11-22 NOTE — Progress Notes (Signed)
EKG done post-op on floor - A-Fib, Dr Marthenia Rolling MD notified

## 2015-11-22 NOTE — Anesthesia Procedure Notes (Signed)
Procedure Name: Intubation Date/Time: 11/22/2015 8:07 AM Performed by: Myna Bright Pre-anesthesia Checklist: Patient identified, Emergency Drugs available, Suction available and Patient being monitored Patient Re-evaluated:Patient Re-evaluated prior to inductionOxygen Delivery Method: Circle system utilized Preoxygenation: Pre-oxygenation with 100% oxygen Intubation Type: IV induction Ventilation: Mask ventilation without difficulty and Oral airway inserted - appropriate to patient size Laryngoscope Size: Mac and 4 Grade View: Grade I Tube type: Oral Tube size: 7.5 mm Number of attempts: 1 Airway Equipment and Method: Stylet Placement Confirmation: ETT inserted through vocal cords under direct vision,  positive ETCO2 and breath sounds checked- equal and bilateral Secured at: 23 cm Tube secured with: Tape Dental Injury: Teeth and Oropharynx as per pre-operative assessment

## 2015-11-22 NOTE — Progress Notes (Signed)
Orthopedic Tech Progress Note Patient Details:  Ethan Faulkner 1915-06-06 [id number]  Patient ID: Ethan Faulkner, male   DOB: 01/31/1916, 82 y.o.   MRN: [id number] Pt unable to use trapeze bar patient helper; RN notified  [name] 11/22/2015, 1:13 PM

## 2015-11-22 NOTE — Telephone Encounter (Signed)
Ethan Faulkner called because she wanted to let you know that Ethan Faulkner had a partial hip replacement this morning, surgery went well, everything looks good

## 2015-11-23 DIAGNOSIS — D62 Acute posthemorrhagic anemia: Secondary | ICD-10-CM

## 2015-11-23 DIAGNOSIS — W19XXXD Unspecified fall, subsequent encounter: Secondary | ICD-10-CM

## 2015-11-23 DIAGNOSIS — I482 Chronic atrial fibrillation: Secondary | ICD-10-CM

## 2015-11-23 DIAGNOSIS — W19XXXA Unspecified fall, initial encounter: Secondary | ICD-10-CM | POA: Insufficient documentation

## 2015-11-23 LAB — CBC
HEMATOCRIT: 33.7 % — AB (ref 39.0–52.0)
HEMOGLOBIN: 11 g/dL — AB (ref 13.0–17.0)
MCH: 30.1 pg (ref 26.0–34.0)
MCHC: 32.6 g/dL (ref 30.0–36.0)
MCV: 92.3 fL (ref 78.0–100.0)
Platelets: 101 10*3/uL — ABNORMAL LOW (ref 150–400)
RBC: 3.65 MIL/uL — AB (ref 4.22–5.81)
RDW: 13.3 % (ref 11.5–15.5)
WBC: 7.2 10*3/uL (ref 4.0–10.5)

## 2015-11-23 LAB — BASIC METABOLIC PANEL
Anion gap: 7 (ref 5–15)
BUN: 16 mg/dL (ref 6–20)
CHLORIDE: 104 mmol/L (ref 101–111)
CO2: 24 mmol/L (ref 22–32)
Calcium: 8.6 mg/dL — ABNORMAL LOW (ref 8.9–10.3)
Creatinine, Ser: 1.04 mg/dL (ref 0.61–1.24)
GFR calc Af Amer: >60 mL/min (ref >60)
GFR calc non Af Amer: 58 mL/min — ABNORMAL LOW (ref >60)
GLUCOSE: 122 mg/dL — AB (ref 65–99)
POTASSIUM: 4.2 mmol/L (ref 3.5–5.1)
Sodium: 135 mmol/L (ref 135–145)

## 2015-11-23 NOTE — Progress Notes (Signed)
    Subjective:  Denies CP or dyspnea   Objective:  Filed Vitals:   11/22/15 1145 11/22/15 1228 11/22/15 2006 11/23/15 0605  BP: 137/80 149/59 127/64 128/73  Pulse: 85 84 76 98  Temp: 98 F (36.7 C) 98.1 F (36.7 C) 98 F (36.7 C) 100 F (37.8 C)  TempSrc:  Oral Oral Oral  Resp: 14 16 16 16   Height:      Weight:      SpO2: 97% 96% 94% 91%    Intake/Output from previous day:  Intake/Output Summary (Last 24 hours) at 11/23/15 0826 Last data filed at 11/22/15 1145  Gross per 24 hour  Intake   1500 ml  Output    850 ml  Net    650 ml    Physical Exam: Physical exam: Well-developed well-nourished in no acute distress.  Skin is warm and dry.  HEENT is normal.  Neck is supple.  Chest is clear to auscultation with normal expansion.  Cardiovascular exam is irregular Abdominal exam nontender or distended. No masses palpated. Extremities show no edema; s/p repair of hip fracture neuro grossly intact    Lab Results: Basic Metabolic Panel:  Recent Labs  11/20/15 1920 11/22/15 1232  NA 136  --   K 4.1  --   CL 106  --   CO2 23  --   GLUCOSE 121*  --   BUN 28*  --   CREATININE 1.05 1.00  CALCIUM 9.0  --    CBC:  Recent Labs  11/20/15 1920 11/22/15 1232  WBC 4.6 6.4  NEUTROABS 2.7  --   HGB 14.3 13.1  HCT 42.9 39.6  MCV 91.3 93.4  PLT 135* 115*     Assessment/Plan:  1 permanent atrial fibrillation-telemetry reviewed. Heart rate controlled. Continue present dose of metoprolol. Resume apixaban when okay with surgery. Reviewed risk and benefit of anticoagulation with patient and family. We will continue for now. If he has more frequent falls in the future apixaban will need to be discontinued realizing there is a higher risk of stroke. 2 status post repair of hip fracture-management per orthopedics. 3 mild to moderate aortic insufficiency 4 hypothyroidism We will sign off. These call with questions. Follow up with Dr Bronson Ing following DC.  Kirk Ruths 11/23/2015, 8:26 AM

## 2015-11-23 NOTE — Progress Notes (Signed)
OT Cancellation Note  Patient Details Name: Ethan Faulkner MRN: [id number] DOB: 1916-04-05   Cancelled Treatment:    Reason Eval/Treat Not Completed: Other (comment) Pt is Medicare and current D/C plan is SNF. No apparent immediate acute care OT needs, therefore will defer OT to SNF. If OT eval is needed please call Acute Rehab Dept. at [phone] or text page OT at [phone].  [place], OTR/L  [phone] 11/23/2015 11/23/2015, 2:37 PM

## 2015-11-23 NOTE — Evaluation (Signed)
Physical Therapy Evaluation Patient Details Name: Ethan Faulkner MRN: [id number] DOB: January 17, 1916 Today's Date: 11/23/2015   History of Present Illness  Pt is a 82 y.o. male s/p fall with L femoral neck fx. He underwent L hip hemiarthroplasty (anterior approach) 11-22-15.  Clinical Impression  Pt admitted with above diagnosis. Pt currently with functional limitations due to the deficits listed below (see PT Problem List). On eval, pt required +2 max assist for bed mobility and +2 mod assist transfers using RW. Pt was very fearful of pain and falling during all mobility skills resulting in the increased assist level. Pt will benefit from skilled PT to increase their independence and safety with mobility to allow discharge to the venue listed below.  Pt is very motivated and willing to participate in therapy. His positive attitude will assist in a successful rehab outcome.     Follow Up Recommendations SNF;Supervision/Assistance - 24 hour    Equipment Recommendations  Rolling walker with 5" wheels    Recommendations for Other Services       Precautions / Restrictions Precautions Precautions: Fall Restrictions Weight Bearing Restrictions: Yes LLE Weight Bearing: Weight bearing as tolerated      Mobility  Bed Mobility Overal bed mobility: Needs Assistance;+2 for physical assistance Bed Mobility: Supine to Sit     Supine to sit: +2 for physical assistance;Max assist;HOB elevated     General bed mobility comments: Continuous verbal cues for sequencing, use of bed pad to scoot to EOB  Transfers Overall transfer level: Needs assistance Equipment used: Rolling walker (2 wheeled) Transfers: Sit to/from [facility] Sit to Stand: +2 physical assistance;Mod assist;From elevated surface Stand pivot transfers: +2 physical assistance;Mod assist       General transfer comment: Continuous verbal cues for sequencing; Pivoted toward right. Pt unable to clear R foot from  floor but instead pivoted on R forefoot.  Ambulation/Gait             General Gait Details: unable  Stairs            Wheelchair Mobility    Modified Rankin (Stroke Patients Only)       Balance Overall balance assessment: Needs assistance Sitting-balance support: Bilateral upper extremity supported;Feet supported Sitting balance-Leahy Scale: Fair Sitting balance - Comments: Weight shifted over R hip.   Standing balance support: During functional activity;Bilateral upper extremity supported Standing balance-Leahy Scale: Poor                               Pertinent Vitals/Pain Pain Assessment: 0-10 Pain Score: 5  Pain Location: L hip Pain Descriptors / Indicators: Sore Pain Intervention(s): Monitored during session;Limited activity within patient's tolerance;Repositioned    Home Living Family/patient expects to be discharged to:: Private residence Living Arrangements: Alone Available Help at Discharge: Family;Available PRN/intermittently Type of Home: House Home Access: Stairs to enter   [employer] of Steps: 1 Home Layout: One level Home Equipment: Cane - single point      Prior Function Level of Independence: Independent with assistive device(s)         Comments: Cane used for ambulation. Pt lived alone independent. Stills drives.     Hand Dominance        Extremity/Trunk Assessment   Upper Extremity Assessment: Overall WFL for tasks assessed           Lower Extremity Assessment: LLE deficits/detail      Cervical / Trunk Assessment: Kyphotic  Communication  Communication: HOH  Cognition Arousal/Alertness: Awake/alert Behavior During Therapy: WFL for tasks assessed/performed Overall Cognitive Status: Within Functional Limits for tasks assessed                      General Comments      Exercises Total Joint Exercises Ankle Circles/Pumps: AROM;Both;10 reps Heel Slides: AAROM;Left;5 reps Hip  ABduction/ADduction: AAROM;Left;5 reps      Assessment/Plan    PT Assessment Patient needs continued PT services  PT Diagnosis Difficulty walking;Generalized weakness;Acute pain   PT Problem List Decreased strength;Decreased activity tolerance;Decreased balance;Decreased mobility;Pain;Decreased knowledge of use of DME  PT Treatment Interventions DME instruction;Gait training;Functional mobility training;Therapeutic activities;Therapeutic exercise;Patient/family education;Balance training   PT Goals (Current goals can be found in the Care Plan section) Acute Rehab PT Goals Patient Stated Goal: independence PT Goal Formulation: With patient/family Time For Goal Achievement: 12/07/15 Potential to Achieve Goals: Good    Frequency Min 3X/week   Barriers to discharge        Co-evaluation               End of Session Equipment Utilized During Treatment: Gait belt Activity Tolerance: Patient tolerated treatment well Patient left: in chair;with family/visitor present;with call bell/phone within reach Nurse Communication: Mobility status         Time: [id number] PT Time Calculation (min) (ACUTE ONLY): 42 min   Charges:   PT Evaluation $PT Eval Moderate Complexity: 1 Procedure PT Treatments $Therapeutic Activity: 23-37 mins   PT G Codes:        Lorriane Shire 11/23/2015, 2:32 PM

## 2015-11-23 NOTE — Clinical Social Work Placement (Signed)
   CLINICAL SOCIAL WORK PLACEMENT  NOTE  Date:  11/23/2015  Patient Details  Name: SPIRIDON GAUDREAULT MRN: [id number] Date of Birth: 30-Apr-1916  Clinical Social Work is seeking post-discharge placement for this patient at the [place] level of care (*CSW will initial, date and re-position this form in  chart as items are completed):  Yes   Patient/family provided with [place] Work Department's list of facilities offering this level of care within the geographic area requested by the patient (or if unable, by the patient's family).  Yes   Patient/family informed of their freedom to choose among providers that offer the needed level of care, that participate in Medicare, Medicaid or managed care program needed by the patient, have an available bed and are willing to accept the patient.  Yes   Patient/family informed of [facility]'s ownership interest in [facility] and [facility], as well as of the fact that they are under no obligation to receive care at these facilities.  PASRR submitted to EDS on 11/23/15     PASRR number received on 11/23/15     Existing PASRR number confirmed on       FL2 transmitted to all facilities in geographic area requested by pt/family on 11/23/15     FL2 transmitted to all facilities within larger geographic area on       Patient informed that his/her managed care company has contracts with or will negotiate with certain facilities, including the following:            Patient/family informed of bed offers received.  Patient chooses bed at       Physician recommends and patient chooses bed at      Patient to be transferred to   on  .  Patient to be transferred to facility by       Patient family notified on   of transfer.  Name of family member notified:        PHYSICIAN Please prepare priority discharge summary, including medications, Please prepare prescriptions, Please sign FL2     Additional  Comment:    _______________________________________________ Junie Spencer, LCSW 11/23/2015, 10:09 AM

## 2015-11-23 NOTE — NC FL2 (Signed)
[place] LEVEL OF CARE SCREENING TOOL     IDENTIFICATION  Patient Name: Ethan Faulkner Birthdate: 1916-03-08 Sex: male Admission Date (Current Location): 11/20/2015  [facility] and [place] Number:  Herbalist and Address:  [facility] [place] [place] [address] [place], [place] [address]      Provider Number: [id number]  Attending Physician Name and Address:  Bonnell Public, MD  Relative Name and Phone Number:       Current Level of Care: Hospital Recommended Level of Care: [place] Prior Approval Number:    Date Approved/Denied:   PASRR Number:  ([id number])  Discharge Plan: SNF    Current Diagnoses: Patient Active Problem List   Diagnosis Date Noted  . Femoral neck fracture ([place]) 11/20/2015  . Renal malignant tumor ([place]) 06/06/2015  . Long-term (current) use of anticoagulants 04/29/2015  . Subclinical hypothyroidism 03/01/2014  . Atrial fibrillation ([place]) 06/09/2013  . STRESS FRACTURE, FOOT 12/12/2008  . CLOSED FRACTURE OF METATARSAL BONE 12/12/2008    Orientation RESPIRATION BLADDER Height & Weight     Self, Time, Situation, Place  Normal Continent Weight: 160 lb (72.576 kg) Height:  5\' 10"  (177.8 cm)  BEHAVIORAL SYMPTOMS/MOOD NEUROLOGICAL BOWEL NUTRITION STATUS   (none)  (none) Continent Diet (regular)  AMBULATORY STATUS COMMUNICATION OF NEEDS Skin   Extensive Assist Verbally Surgical wounds                       Personal Care Assistance Level of Assistance  Bathing, Feeding, Dressing Bathing Assistance: Limited assistance Feeding assistance: Independent Dressing Assistance: Limited assistance     Functional Limitations Info  Sight, Hearing, Speech Sight Info: Adequate Hearing Info: Adequate Speech Info: Adequate    SPECIAL CARE FACTORS FREQUENCY  PT (By licensed PT), OT (By licensed OT)     PT Frequency:  (5x/week) OT Frequency:  (5x/week)            Contractures  Contractures Info: Not present    Additional Factors Info  Code Status Code Status Info:  (full) Allergies Info:  (Xanax, Tape)           Current Medications (11/23/2015):  This is the current hospital active medication list Current Facility-Administered Medications  Medication Dose Route Frequency Provider Last Rate Last Dose  . 0.9 %  sodium chloride infusion   Intravenous Continuous Leandrew Koyanagi, MD 125 mL/hr at 11/22/15 1443    . acetaminophen (TYLENOL) tablet 650 mg  650 mg Oral Q6H PRN Leandrew Koyanagi, MD   650 mg at 11/23/15 0116   Or  . acetaminophen (TYLENOL) suppository 650 mg  650 mg Rectal Q6H PRN Leandrew Koyanagi, MD      . alum & mag hydroxide-simeth (MAALOX/MYLANTA) 200-200-20 MG/5ML suspension 30 mL  30 mL Oral Q4H PRN Naiping Ephriam Jenkins, MD      . apixaban Arne [place]) tablet 5 mg  5 mg Oral BID Leandrew Koyanagi, MD   5 mg at 11/23/15 0908  . feeding supplement (ENSURE ENLIVE) (ENSURE ENLIVE) liquid 237 mL  237 mL Oral BID BM Bonnell Public, MD   237 mL at 11/23/15 0908  . HYDROcodone-acetaminophen (NORCO/VICODIN) 5-325 MG per tablet 1-2 tablet  1-2 tablet Oral Q6H PRN Etta Quill, DO   2 tablet at 11/22/15 2049  . lactated ringers infusion   Intravenous Continuous Lyn Hollingshead, MD 10 mL/hr at 11/22/15 [phone]    . levothyroxine (SYNTHROID, LEVOTHROID) tablet 25  mcg  25 mcg Oral QAC breakfast Etta Quill, DO   25 mcg at 11/23/15 [id number]  . loratadine (CLARITIN) tablet 10 mg  10 mg Oral Daily Etta Quill, DO   10 mg at 11/23/15 0908  . menthol-cetylpyridinium (CEPACOL) lozenge 3 mg  1 lozenge Oral PRN Naiping Ephriam Jenkins, MD       Or  . phenol (CHLORASEPTIC) mouth spray 1 spray  1 spray Mouth/Throat PRN Naiping Ephriam Jenkins, MD      . methocarbamol (ROBAXIN) tablet 500 mg  500 mg Oral Q6H PRN Naiping Ephriam Jenkins, MD       Or  . methocarbamol (ROBAXIN) 500 mg in dextrose 5 % 50 mL IVPB  500 mg Intravenous Q6H PRN Naiping Ephriam Jenkins, MD      . metoCLOPramide (REGLAN) tablet 5-10 mg  5-10 mg Oral Q8H PRN  Naiping Ephriam Jenkins, MD       Or  . metoCLOPramide (REGLAN) injection 5-10 mg  5-10 mg Intravenous Q8H PRN Naiping Ephriam Jenkins, MD      . metoprolol succinate (TOPROL-XL) 24 hr tablet 12.5 mg  12.5 mg Oral Daily Almyra Deforest, PA   12.5 mg at 11/23/15 0908  . morphine 2 MG/ML injection 0.5 mg  0.5 mg Intravenous Q2H PRN Etta Quill, DO   0.5 mg at 11/20/15 2242  . ondansetron (ZOFRAN) tablet 4 mg  4 mg Oral Q6H PRN Naiping Ephriam Jenkins, MD       Or  . ondansetron [facility]) injection 4 mg  4 mg Intravenous Q6H PRN Naiping Ephriam Jenkins, MD      . oxyCODONE (Oxy IR/ROXICODONE) immediate release tablet 5-10 mg  5-10 mg Oral Q4H PRN Naiping Ephriam Jenkins, MD      . polyethylene glycol (MIRALAX / GLYCOLAX) packet 17 g  17 g Oral Daily Etta Quill, DO   17 g at 11/23/15 0908     Discharge Medications: Please see discharge summary for a list of discharge medications.  Relevant Imaging Results:  Relevant Lab Results:   Additional Information SS#:[id number]  Junie Spencer, LCSW

## 2015-11-23 NOTE — Progress Notes (Signed)
PROGRESS NOTE    Ethan Faulkner  [id number] DOB: [birth year] DOA: 11/20/2015 PCP: Sallee Lange, MD  Outpatient Specialists:   Brief Narrative: 82 y.o. male with medical history significant of Prostate cancer 14 years ago, a renal mass that is being managed non-surgically due to slow growth and patients age, A.Fib on eliquis. Patient presents to the ED after a mechanical fall at home PTA. Resulting left hip pain, worse with movement, associated inability to bear weight. No syncope, no hitting head. Lives alone at home, fall was secondary to a trip, used life-alert to get EMS. Afib with heart rate in the 40-50's with some pauses, Cardiology consulted. Assessment & Plan:   Principal Problem:   Femoral neck fracture (HCC) Active Problems:   Atrial fibrillation (HCC)   Long-term (current) use of anticoagulants   Renal malignant tumor (HCC)  Assessment/Plan Principal Problem:  Femoral neck fracture (HCC) s/p left hip hemiarthroplasty.  Active Problems:  Atrial fibrillation (HCC)  Long-term (current) use of anticoagulants  Renal malignant tumor (HCC)   Femoral neck fx of left hip -  S/P left hemiarthroplasty. Seen at the PACU. Remains stable, with no complaints.  A.Fib - Controlled heart rate. Anticoagulation restarted. On Metoprolol XL 12.5mg  once daily. Cardiology input is highly appreciated. Anemia - Acute blood loss following hip surgery. H/o Renal malignant tumor - surgery not recommended due to age   DVT prophylaxis: SCDs, resume eliquis when ok with ortho Code Status: Full Family Communication: Family at bedside Consults called: Ortho Admission status: Admit to inpatient  Subjective: No new complaints. No fever or chills. No SOB and no chest pain.   Objective: Filed Vitals:   11/22/15 1145 11/22/15 1228 11/22/15 2006 11/23/15 0605  BP: 137/80 149/59 127/64 128/73  Pulse: 85 84 76 98  Temp: 98 F (36.7 C) 98.1 F (36.7 C) 98 F (36.7 C) 100 F  (37.8 C)  TempSrc:  Oral Oral Oral  Resp: 14 16 16 16   Height:      Weight:      SpO2: 97% 96% 94% 91%    Intake/Output Summary (Last 24 hours) at 11/23/15 1626 Last data filed at 11/23/15 0924  Gross per 24 hour  Intake      0 ml  Output    100 ml  Net   -100 ml   Filed Weights   11/20/15 1902  Weight: 72.576 kg (160 lb)    Examination:  General exam: Appears calm and comfortable  Respiratory system: Clear to auscultation. Respiratory effort normal. Cardiovascular system: S1 & S2, irregular. Gastrointestinal system: Abdomen is nondistended, soft and nontender. Central nervous system: Alert and oriented. Moves all limbs.  Extremities: No leg edema.  Data Reviewed: I have personally reviewed following labs and imaging studies  CBC:  Recent Labs Lab 11/20/15 1920 11/22/15 1232 11/23/15 0813  WBC 4.6 6.4 7.2  NEUTROABS 2.7  --   --   HGB 14.3 13.1 11.0*  HCT 42.9 39.6 33.7*  MCV 91.3 93.4 92.3  PLT 135* 115* [id number]*   Basic Metabolic Panel:  Recent Labs Lab 11/20/15 1920 11/22/15 1232 11/23/15 0813  NA 136  --  135  K 4.1  --  4.2  CL 106  --  104  CO2 23  --  24  GLUCOSE 121*  --  122*  BUN 28*  --  16  CREATININE 1.05 1.00 1.04  CALCIUM 9.0  --  8.6*   GFR: Estimated Creatinine Clearance: 41.7 mL/min (by C-G formula based on  Cr of 1.04). Liver Function Tests: No results for input(s): AST, ALT, ALKPHOS, BILITOT, PROT, ALBUMIN in the last 168 hours. No results for input(s): LIPASE, AMYLASE in the last 168 hours. No results for input(s): AMMONIA in the last 168 hours. Coagulation Profile:  Recent Labs Lab 11/21/15 0540  INR 1.28   Cardiac Enzymes: No results for input(s): CKTOTAL, CKMB, CKMBINDEX, TROPONINI in the last 168 hours. BNP (last 3 results) No results for input(s): PROBNP in the last 8760 hours. HbA1C: No results for input(s): HGBA1C in the last 72 hours. CBG: No results for input(s): GLUCAP in the last 168 hours. Lipid  Profile: No results for input(s): CHOL, HDL, LDLCALC, TRIG, CHOLHDL, LDLDIRECT in the last 72 hours. Thyroid Function Tests: No results for input(s): TSH, T4TOTAL, FREET4, T3FREE, THYROIDAB in the last 72 hours. Anemia Panel: No results for input(s): VITAMINB12, FOLATE, FERRITIN, TIBC, IRON, RETICCTPCT in the last 72 hours. Urine analysis:    Component Value Date/Time   COLORURINE YELLOW 11/22/2015 [place] 11/22/2015 0248   LABSPEC 1.020 11/22/2015 0248   PHURINE 5.5 11/22/2015 0248   GLUCOSEU NEGATIVE 11/22/2015 0248   HGBUR NEGATIVE 11/22/2015 0248   BILIRUBINUR NEGATIVE 11/22/2015 0248   KETONESUR NEGATIVE 11/22/2015 0248   PROTEINUR NEGATIVE 11/22/2015 0248   UROBILINOGEN 2.0 05/28/2015 1608   NITRITE NEGATIVE 11/22/2015 0248   LEUKOCYTESUR NEGATIVE 11/22/2015 0248   Sepsis Labs: @LABRCNTIP (procalcitonin:4,lacticidven:4)  ) Recent Results (from the past 240 hour(s))  Surgical pcr screen     Status: None   Collection Time: 11/21/15  2:55 AM  Result Value Ref Range Status   MRSA, PCR NEGATIVE NEGATIVE Final   Staphylococcus aureus NEGATIVE NEGATIVE Final    Comment:        The Xpert SA Assay (FDA approved for NASAL specimens in patients over 26 years of age), is one component of a comprehensive surveillance program.  Test performance has been validated by [facility] for patients greater than or equal to 82 year old. It is not intended to diagnose infection nor to guide or monitor treatment.          Radiology Studies: Pelvis Portable  11/22/2015  CLINICAL DATA:  Status post left total hip replacement EXAM: PORTABLE PELVIS 1-2 VIEWS COMPARISON:  None. FINDINGS: Frontal view obtained. There is a total hip replacement on the left with the prosthesis well-seated. No acute fracture or dislocation is evident. Soft tissue air on the left is an expected postoperative finding. There is extensive arterial vascular calcification bilaterally. IMPRESSION:  Total hip replacement on the left with prosthetic component well-seated. No acute fracture or dislocation. Atherosclerotic vascular calcification noted. Electronically Signed   By: Lowella Grip III M.D.   On: 11/22/2015 11:37   Dg Hip Operative Unilat With Pelvis Left  11/22/2015  CLINICAL DATA:  Left hip surgery. EXAM: OPERATIVE left HIP (WITH PELVIS IF PERFORMED) 1 VIEWS TECHNIQUE: Fluoroscopic spot image(s) were submitted for interpretation post-operatively. COMPARISON:  Radiography from 2 days prior FINDINGS: Fluoroscopy for left hip hemiarthroplasty. Prosthesis is located and there is no periprosthetic fracture. IMPRESSION: Left hip hemiarthroplasty without adverse finding. Electronically Signed   By: Monte Fantasia M.D.   On: 11/22/2015 10:14        Scheduled Meds: . apixaban  5 mg Oral BID  . feeding supplement (ENSURE ENLIVE)  237 mL Oral BID BM  . levothyroxine  25 mcg Oral QAC breakfast  . loratadine  10 mg Oral Daily  . metoprolol succinate  12.5 mg  Oral Daily  . polyethylene glycol  17 g Oral Daily   Continuous Infusions: . sodium chloride 125 mL/hr at 11/22/15 1443  . lactated ringers 10 mL/hr at 11/22/15 0743     LOS: 3 days    Time spent: 25 Mins    Dana Allan, MD  [facility] Pager #: [phone] 7PM-7AM contact night coverage as above

## 2015-11-23 NOTE — Progress Notes (Signed)
   Subjective:  Patient is slightly confused and agitated.  Objective:   VITALS:   Filed Vitals:   11/22/15 1145 11/22/15 1228 11/22/15 2006 11/23/15 0605  BP: 137/80 149/59 127/64 128/73  Pulse: 85 84 76 98  Temp: 98 F (36.7 C) 98.1 F (36.7 C) 98 F (36.7 C) 100 F (37.8 C)  TempSrc:  Oral Oral Oral  Resp: 14 16 16 16   Height:      Weight:      SpO2: 97% 96% 94% 91%    Neurovascular intact Sensation intact distally Intact pulses distally Dorsiflexion/Plantar flexion intact Incision: dressing C/D/I and no drainage No cellulitis present Compartment soft   Lab Results  Component Value Date   WBC 6.4 11/22/2015   HGB 13.1 11/22/2015   HCT 39.6 11/22/2015   MCV 93.4 11/22/2015   PLT 115* 11/22/2015     Assessment/Plan:  1 Day Post-Op   - Expected postop acute blood loss anemia - will monitor for symptoms - Up with PT/OT - DVT ppx - SCDs, ambulation, eliquis - WBAT operative extremity - will need SNF  Marianna Payment 11/23/2015, 8:14 AM [phone]

## 2015-11-24 LAB — CBC
HEMATOCRIT: 31.4 % — AB (ref 39.0–52.0)
Hemoglobin: 10.4 g/dL — ABNORMAL LOW (ref 13.0–17.0)
MCH: 30.4 pg (ref 26.0–34.0)
MCHC: 33.1 g/dL (ref 30.0–36.0)
MCV: 91.8 fL (ref 78.0–100.0)
PLATELETS: 104 10*3/uL — AB (ref 150–400)
RBC: 3.42 MIL/uL — ABNORMAL LOW (ref 4.22–5.81)
RDW: 13.6 % (ref 11.5–15.5)
WBC: 6.9 10*3/uL (ref 4.0–10.5)

## 2015-11-24 LAB — BASIC METABOLIC PANEL
ANION GAP: 5 (ref 5–15)
BUN: 16 mg/dL (ref 6–20)
CALCIUM: 8.3 mg/dL — AB (ref 8.9–10.3)
CO2: 22 mmol/L (ref 22–32)
CREATININE: 0.98 mg/dL (ref 0.61–1.24)
Chloride: 105 mmol/L (ref 101–111)
Glucose, Bld: 92 mg/dL (ref 65–99)
Potassium: 4 mmol/L (ref 3.5–5.1)
SODIUM: 132 mmol/L — AB (ref 135–145)

## 2015-11-24 NOTE — Progress Notes (Signed)
PROGRESS NOTE    Ethan Faulkner  [id number] DOB: May 15, 1916 DOA: 11/20/2015 PCP: Sallee Lange, MD  Outpatient Specialists:   Brief Narrative: 82 y.o. male with medical history significant of Prostate cancer 14 years ago, a renal mass that is being managed non-surgically due to slow growth and patients age, A.Fib on eliquis. Patient presents to the ED after a mechanical fall at home PTA. Resulting left hip pain, worse with movement, associated inability to bear weight. No syncope, no hitting head. Lives alone at home, fall was secondary to a trip, used life-alert to get EMS. Afib with heart rate in the 40-50's with some pauses, Cardiology consulted. Assessment & Plan:   Principal Problem:   Femoral neck fracture (HCC) Active Problems:   Atrial fibrillation (HCC)   Long-term (current) use of anticoagulants   Renal malignant tumor ([place])   Fall   Acute blood loss anemia  Assessment/Plan Principal Problem:  Femoral neck fracture (HCC) s/p left hip hemiarthroplasty. Pursue SNF with rehab.  Active Problems:  Atrial fibrillation (HCC)  Long-term (current) use of anticoagulants  Renal malignant tumor (HCC)   Femoral neck fx of left hip -  S/P left hemiarthroplasty. Seen at the PACU. Remains stable, with no complaints.  A.Fib - Controlled heart rate. Anticoagulation restarted. On Metoprolol XL 12.5mg  once daily. Cardiology input is highly appreciated. Anemia - Acute blood loss following hip surgery. H/o Renal malignant tumor - surgery not recommended due to age   DVT prophylaxis: SCDs, resume eliquis when ok with ortho Code Status: Full Family Communication: Family at bedside Consults called: Ortho Admission status: Admit to inpatient  Subjective: No new complaints. No fever or chills. No SOB and no chest pain.   Objective: Filed Vitals:   11/23/15 1645 11/23/15 2253 11/24/15 0400 11/24/15 0604  BP: 126/72 124/56  132/72  Pulse: 82 77  94  Temp: 98.9  F (37.2 C) 98 F (36.7 C)    TempSrc:  Axillary    Resp: 16  18   Height:      Weight:      SpO2: 93% 95%  93%    Intake/Output Summary (Last 24 hours) at 11/24/15 0912 Last data filed at 11/23/15 2300  Gross per 24 hour  Intake    420 ml  Output    400 ml  Net     20 ml   Filed Weights   11/20/15 1902  Weight: 72.576 kg (160 lb)    Examination:  General exam: Appears calm and comfortable  Respiratory system: Clear to auscultation. Respiratory effort normal. Cardiovascular system: S1 & S2, irregular. Gastrointestinal system: Abdomen is nondistended, soft and nontender. Central nervous system: Alert and oriented. Moves all limbs.  Extremities: No leg edema.  Data Reviewed: I have personally reviewed following labs and imaging studies  CBC:  Recent Labs Lab 11/20/15 1920 11/22/15 1232 11/23/15 0813 11/24/15 0407  WBC 4.6 6.4 7.2 6.9  NEUTROABS 2.7  --   --   --   HGB 14.3 13.1 11.0* 10.4*  HCT 42.9 39.6 33.7* 31.4*  MCV 91.3 93.4 92.3 91.8  PLT 135* 115* 101* [id number]*   Basic Metabolic Panel:  Recent Labs Lab 11/20/15 1920 11/22/15 1232 11/23/15 0813 11/24/15 0407  NA 136  --  135 132*  K 4.1  --  4.2 4.0  CL 106  --  104 105  CO2 23  --  24 22  GLUCOSE 121*  --  122* 92  BUN 28*  --  16 16  CREATININE 1.05 1.00 1.04 0.98  CALCIUM 9.0  --  8.6* 8.3*   GFR: Estimated Creatinine Clearance: 44.2 mL/min (by C-G formula based on Cr of 0.98). Liver Function Tests: No results for input(s): AST, ALT, ALKPHOS, BILITOT, PROT, ALBUMIN in the last 168 hours. No results for input(s): LIPASE, AMYLASE in the last 168 hours. No results for input(s): AMMONIA in the last 168 hours. Coagulation Profile:  Recent Labs Lab 11/21/15 0540  INR 1.28   Cardiac Enzymes: No results for input(s): CKTOTAL, CKMB, CKMBINDEX, TROPONINI in the last 168 hours. BNP (last 3 results) No results for input(s): PROBNP in the last 8760 hours. HbA1C: No results for input(s): HGBA1C  in the last 72 hours. CBG: No results for input(s): GLUCAP in the last 168 hours. Lipid Profile: No results for input(s): CHOL, HDL, LDLCALC, TRIG, CHOLHDL, LDLDIRECT in the last 72 hours. Thyroid Function Tests: No results for input(s): TSH, T4TOTAL, FREET4, T3FREE, THYROIDAB in the last 72 hours. Anemia Panel: No results for input(s): VITAMINB12, FOLATE, FERRITIN, TIBC, IRON, RETICCTPCT in the last 72 hours. Urine analysis:    Component Value Date/Time   COLORURINE YELLOW 11/22/2015 [place] 11/22/2015 0248   LABSPEC 1.020 11/22/2015 0248   PHURINE 5.5 11/22/2015 0248   GLUCOSEU NEGATIVE 11/22/2015 0248   HGBUR NEGATIVE 11/22/2015 0248   BILIRUBINUR NEGATIVE 11/22/2015 0248   KETONESUR NEGATIVE 11/22/2015 0248   PROTEINUR NEGATIVE 11/22/2015 0248   UROBILINOGEN 2.0 05/28/2015 1608   NITRITE NEGATIVE 11/22/2015 0248   LEUKOCYTESUR NEGATIVE 11/22/2015 0248   Sepsis Labs: @LABRCNTIP (procalcitonin:4,lacticidven:4)  ) Recent Results (from the past 240 hour(s))  Surgical pcr screen     Status: None   Collection Time: 11/21/15  2:55 AM  Result Value Ref Range Status   MRSA, PCR NEGATIVE NEGATIVE Final   Staphylococcus aureus NEGATIVE NEGATIVE Final    Comment:        The Xpert SA Assay (FDA approved for NASAL specimens in patients over 67 years of age), is one component of a comprehensive surveillance program.  Test performance has been validated by [facility] for patients greater than or equal to 31 year old. It is not intended to diagnose infection nor to guide or monitor treatment.          Radiology Studies: Pelvis Portable  11/22/2015  CLINICAL DATA:  Status post left total hip replacement EXAM: PORTABLE PELVIS 1-2 VIEWS COMPARISON:  None. FINDINGS: Frontal view obtained. There is a total hip replacement on the left with the prosthesis well-seated. No acute fracture or dislocation is evident. Soft tissue air on the left is an expected  postoperative finding. There is extensive arterial vascular calcification bilaterally. IMPRESSION: Total hip replacement on the left with prosthetic component well-seated. No acute fracture or dislocation. Atherosclerotic vascular calcification noted. Electronically Signed   By: Lowella Grip III M.D.   On: 11/22/2015 11:37   Dg Hip Operative Unilat With Pelvis Left  11/22/2015  CLINICAL DATA:  Left hip surgery. EXAM: OPERATIVE left HIP (WITH PELVIS IF PERFORMED) 1 VIEWS TECHNIQUE: Fluoroscopic spot image(s) were submitted for interpretation post-operatively. COMPARISON:  Radiography from 2 days prior FINDINGS: Fluoroscopy for left hip hemiarthroplasty. Prosthesis is located and there is no periprosthetic fracture. IMPRESSION: Left hip hemiarthroplasty without adverse finding. Electronically Signed   By: Monte Fantasia M.D.   On: 11/22/2015 10:14        Scheduled Meds: . apixaban  5 mg Oral BID  . feeding supplement (ENSURE ENLIVE)  237 mL Oral  BID BM  . levothyroxine  25 mcg Oral QAC breakfast  . loratadine  10 mg Oral Daily  . metoprolol succinate  12.5 mg Oral Daily  . polyethylene glycol  17 g Oral Daily   Continuous Infusions: . sodium chloride 125 mL/hr at 11/22/15 1443  . lactated ringers 10 mL/hr at 11/22/15 0743     LOS: 4 days    Time spent: 25 Mins    Dana Allan, MD  [facility] Pager #: [phone] 7PM-7AM contact night coverage as above

## 2015-11-24 NOTE — Progress Notes (Signed)
Orthopedic Tech Progress Note Patient Details:  Ethan Faulkner [birth year] [id number]  Ortho Devices Ortho Device/Splint Location: Trapeze bar Ortho Device/Splint Interventions: Application   [name] 11/24/2015, 9:07 AM

## 2015-11-24 NOTE — Progress Notes (Signed)
Physical Therapy Treatment Patient Details Name: Ethan Faulkner MRN: [id number] DOB: 1915-08-05 Today's Date: 11/24/2015    History of Present Illness Pt is a 82 y.o. male s/p fall with L femoral neck fx. He underwent L hip hemiarthroplasty (anterior approach) 11-22-15.    PT Comments    Pt with great improvement today compared to eval. Improved motor planning ability and returning muscle memory resulting in decreased assist level and progression of ambulation. Pt is very motivated and willing to participate with therapy.    Follow Up Recommendations  SNF;Supervision/Assistance - 24 hour     Equipment Recommendations  Rolling walker with 5" wheels    Recommendations for Other Services       Precautions / Restrictions Precautions Precautions: Fall Restrictions Weight Bearing Restrictions: Yes LLE Weight Bearing: Weight bearing as tolerated    Mobility  Bed Mobility         Supine to sit: HOB elevated;Mod assist     General bed mobility comments: verbal cues for sequencing, increased time to complete  Transfers   Equipment used: Rolling walker (2 wheeled)   Sit to Stand: Mod assist Stand pivot transfers: +2 safety/equipment;Mod assist       General transfer comment: verbal cues for sequencing  Ambulation/Gait Ambulation/Gait assistance: Mod assist;+2 safety/equipment Ambulation Distance (Feet): 35 Feet (and 10 feet) Assistive device: Rolling walker (2 wheeled) Gait Pattern/deviations: Step-to pattern;Antalgic;Decreased stride length;Trunk flexed Gait velocity: decreased   General Gait Details: continuouse verbal cues for sequencing and posture, assist with RW management   Stairs            Wheelchair Mobility    Modified Rankin (Stroke Patients Only)       Balance   Sitting-balance support: Bilateral upper extremity supported;Feet supported Sitting balance-Leahy Scale: Good     Standing balance support: During functional  activity;Bilateral upper extremity supported Standing balance-Leahy Scale: Fair                      Cognition Arousal/Alertness: Awake/alert Behavior During Therapy: WFL for tasks assessed/performed Overall Cognitive Status: Within Functional Limits for tasks assessed                      Exercises Total Joint Exercises Ankle Circles/Pumps: AROM;Both;10 reps    General Comments        Pertinent Vitals/Pain Pain Assessment: 0-10 Pain Score: 5  Pain Location: L hip Pain Descriptors / Indicators: Sore;Tightness Pain Intervention(s): Monitored during session;Repositioned    Home Living                      Prior Function            PT Goals (current goals can now be found in the care plan section) Acute Rehab PT Goals Patient Stated Goal: independence PT Goal Formulation: With patient/family Time For Goal Achievement: 12/07/15 Potential to Achieve Goals: Good Progress towards PT goals: Progressing toward goals    Frequency  Min 3X/week    PT Plan Current plan remains appropriate    Co-evaluation             End of Session Equipment Utilized During Treatment: Gait belt Activity Tolerance: Patient tolerated treatment well Patient left: in chair;with family/visitor present;with call bell/phone within reach     Time: 0912-0942 PT Time Calculation (min) (ACUTE ONLY): 30 min  Charges:  $Gait Training: 23-37 mins  G Codes:      Lorriane Shire 11/24/2015, 10:57 AM

## 2015-11-24 NOTE — Telephone Encounter (Signed)
I discussed the case with the daughter

## 2015-11-25 ENCOUNTER — Other Ambulatory Visit: Payer: Self-pay

## 2015-11-25 ENCOUNTER — Telehealth: Payer: Self-pay | Admitting: Family Medicine

## 2015-11-25 ENCOUNTER — Inpatient Hospital Stay
Admission: RE | Admit: 2015-11-25 | Discharge: 2015-12-26 | Disposition: A | Discharge status: Home / Self Care | Payer: Medicare Other | Source: Ambulatory Visit | Attending: Internal Medicine | Admitting: Internal Medicine

## 2015-11-25 ENCOUNTER — Encounter (HOSPITAL_COMMUNITY): Payer: Self-pay | Admitting: Orthopaedic Surgery

## 2015-11-25 DIAGNOSIS — W19XXXD Unspecified fall, subsequent encounter: Secondary | ICD-10-CM | POA: Diagnosis not present

## 2015-11-25 DIAGNOSIS — C642 Malignant neoplasm of left kidney, except renal pelvis: Secondary | ICD-10-CM | POA: Diagnosis not present

## 2015-11-25 DIAGNOSIS — S72002D Fracture of unspecified part of neck of left femur, subsequent encounter for closed fracture with routine healing: Secondary | ICD-10-CM | POA: Diagnosis not present

## 2015-11-25 DIAGNOSIS — R293 Abnormal posture: Secondary | ICD-10-CM | POA: Diagnosis not present

## 2015-11-25 DIAGNOSIS — R262 Difficulty in walking, not elsewhere classified: Secondary | ICD-10-CM | POA: Diagnosis not present

## 2015-11-25 DIAGNOSIS — S72009A Fracture of unspecified part of neck of unspecified femur, initial encounter for closed fracture: Secondary | ICD-10-CM | POA: Diagnosis not present

## 2015-11-25 DIAGNOSIS — D62 Acute posthemorrhagic anemia: Secondary | ICD-10-CM | POA: Diagnosis not present

## 2015-11-25 DIAGNOSIS — M6281 Muscle weakness (generalized): Secondary | ICD-10-CM | POA: Diagnosis not present

## 2015-11-25 DIAGNOSIS — Z471 Aftercare following joint replacement surgery: Secondary | ICD-10-CM | POA: Diagnosis not present

## 2015-11-25 DIAGNOSIS — S72042D Displaced fracture of base of neck of left femur, subsequent encounter for closed fracture with routine healing: Secondary | ICD-10-CM | POA: Diagnosis not present

## 2015-11-25 DIAGNOSIS — I482 Chronic atrial fibrillation: Secondary | ICD-10-CM | POA: Diagnosis not present

## 2015-11-25 DIAGNOSIS — Z9181 History of falling: Secondary | ICD-10-CM | POA: Diagnosis not present

## 2015-11-25 DIAGNOSIS — R531 Weakness: Secondary | ICD-10-CM | POA: Diagnosis not present

## 2015-11-25 DIAGNOSIS — E038 Other specified hypothyroidism: Secondary | ICD-10-CM | POA: Diagnosis not present

## 2015-11-25 DIAGNOSIS — Z7901 Long term (current) use of anticoagulants: Secondary | ICD-10-CM | POA: Diagnosis not present

## 2015-11-25 DIAGNOSIS — R001 Bradycardia, unspecified: Secondary | ICD-10-CM | POA: Diagnosis not present

## 2015-11-25 DIAGNOSIS — S72002A Fracture of unspecified part of neck of left femur, initial encounter for closed fracture: Secondary | ICD-10-CM | POA: Diagnosis not present

## 2015-11-25 LAB — CBC
HEMATOCRIT: 30.1 % — AB (ref 39.0–52.0)
HEMOGLOBIN: 10 g/dL — AB (ref 13.0–17.0)
MCH: 30.5 pg (ref 26.0–34.0)
MCHC: 33.2 g/dL (ref 30.0–36.0)
MCV: 91.8 fL (ref 78.0–100.0)
Platelets: 112 10*3/uL — ABNORMAL LOW (ref 150–400)
RBC: 3.28 MIL/uL — ABNORMAL LOW (ref 4.22–5.81)
RDW: 13.5 % (ref 11.5–15.5)
WBC: 5.5 10*3/uL (ref 4.0–10.5)

## 2015-11-25 MED ORDER — POLYETHYLENE GLYCOL 3350 17 G PO PACK: 17.0000 g | PACK | Freq: Every day | ORAL | Status: DC

## 2015-11-25 MED ORDER — METOPROLOL SUCCINATE ER 25 MG PO TB24: 12.5000 mg | ORAL_TABLET | Freq: Every day | ORAL | Status: DC

## 2015-11-25 MED ORDER — ENSURE ENLIVE PO LIQD: 237.0000 mL | Freq: Two times a day (BID) | ORAL | Status: DC

## 2015-11-25 MED ORDER — ALUM & MAG HYDROXIDE-SIMETH 200-200-20 MG/5ML PO SUSP: 30.0000 mL | ORAL | Status: DC | PRN

## 2015-11-25 MED ORDER — HYDROCODONE-ACETAMINOPHEN 7.5-325 MG PO TABS: 1.0000 | ORAL_TABLET | Freq: Four times a day (QID) | ORAL | Status: DC | PRN

## 2015-11-25 NOTE — Telephone Encounter (Signed)
Rx faxed to [facility] at [phone].   [address] [place], [place] [address]  Phone #: [phone] 

## 2015-11-25 NOTE — Clinical Social Work Placement (Signed)
   CLINICAL SOCIAL WORK PLACEMENT  NOTE  Date:  11/25/2015  Patient Details  Name: Ethan Faulkner MRN: [id number] Date of Birth: 17-Aug-1915  Clinical Social Work is seeking post-discharge placement for this patient at the [place] level of care (*CSW will initial, date and re-position this form in  chart as items are completed):  Yes   Patient/family provided with [place] Work Department's list of facilities offering this level of care within the geographic area requested by the patient (or if unable, by the patient's family).  Yes   Patient/family informed of their freedom to choose among providers that offer the needed level of care, that participate in Medicare, Medicaid or managed care program needed by the patient, have an available bed and are willing to accept the patient.  Yes   Patient/family informed of [place]'s ownership interest in [facility] and [facility], as well as of the fact that they are under no obligation to receive care at these facilities.  PASRR submitted to EDS on 11/23/15     PASRR number received on 11/23/15     Existing PASRR number confirmed on       FL2 transmitted to all facilities in geographic area requested by pt/family on 11/23/15     FL2 transmitted to all facilities within larger geographic area on       Patient informed that his/her managed care company has contracts with or will negotiate with certain facilities, including the following:        Yes   Patient/family informed of bed offers received.  Patient chooses bed at [facility]     Physician recommends and patient chooses bed at      Patient to be transferred to [facility] on 11/25/15.  Patient to be transferred to facility by [facility]     Patient family notified on 11/25/15 of transfer.  Name of family member notified:  Daughter, [name]     PHYSICIAN Please prepare priority discharge summary, including  medications, Please prepare prescriptions, Please sign FL2     Additional Comment:    _______________________________________________ Caroline Sauger, LCSW 11/25/2015, 12:09 PM

## 2015-11-25 NOTE — Progress Notes (Signed)
Patient discharged to [facility] home, family at bedside, transported by ambulance

## 2015-11-25 NOTE — Care Management Important Message (Signed)
Important Message  Patient Details  Name: Ethan Faulkner MRN: [id number] Date of Birth: 1915/09/22   Medicare Important Message Given:  Yes    [name] Abena 11/25/2015, 11:26 AM

## 2015-11-25 NOTE — Clinical Social Work Note (Signed)
Patient to be discharged to [facility]. Patient's daughter, [name], updated regarding discharge. Patient to be transported via EMS. RN report number: [place], [facility]: [phone] Surgical: [phone]

## 2015-11-25 NOTE — Clinical Social Work Note (Signed)
LCSW updated patient's daughter, [name], that patient has bed available at first choice: [facility]. Per patient's daughter, [name] to complete admissions paperwork at facility at 1pm on 11/25/2015.  LCSW to continue to follow and assist with discharge to [facility] once medically stable for discharge.  Lubertha Sayres, [facility]: [phone] Surgical: [phone]

## 2015-11-25 NOTE — Telephone Encounter (Signed)
FYI, patient is being sent to the [facility] today after being discharged from [facility].

## 2015-11-25 NOTE — Discharge Summary (Signed)
Physician Discharge Summary  Patient ID: Ethan Faulkner MRN: [id number] DOB/AGE: [birth year] 82 y.o.  Admit date: 11/20/2015 Discharge date: 11/25/2015  Admission Diagnoses:  Discharge Diagnoses:  Principal Problem:   Femoral neck fracture (HCC) Bradycardia with pauses Active Problems:   Atrial fibrillation (HCC)   Long-term (current) use of anticoagulants   Renal malignant tumor ([place])   Fall   Acute blood loss anemia   Discharged Condition: stable  Hospital Course: 82 y.o. male with medical history significant of Prostate cancer 14 years ago, a renal mass that is being managed non-surgically due to slow growth and patients age, A.Fib on eliquis. Patient presented to the ED after a mechanical fall at home with subsequent left hip pain, worse with movement, associated inability to bear weight. Work up done revealed left femoral neck fracture. During the hospital stay, patient was noted to have heart rate in the 40-50's with some pauses. Patient has chronic atrial fibrillation. Cardiology team was consulted for the bradycardia and pauses and the Metoprolol was decreased from 25mg  po once daily to 12.5mg  po once daily. Pauses resolved, and the heart rate has been within normal range. Orthopedic team directed management of patient's left hip fracture. Anticoagulation was discontinued on admission prior to left hemiarthroplasty. Patient has remained stable post surgery. Patient will be discharged to a SNF for short term rehabilitation on anticoagulation (that was resumed after surgery).  Consults: cardiology and orthopedic surgery  Discharge Medication - Please see Med Rec.  Procedure done: Left hemiarthroplasty  Discharge Exam: Blood pressure 141/80, pulse 94, temperature 98.6 F (37 C), temperature source Oral, resp. rate 18, height 5\' 10"  (1.778 m), weight 72.576 kg (160 lb), SpO2 95 %.  Disposition: Final discharge disposition not confirmed  Discharge Instructions    Diet - low  sodium heart healthy    Complete by:  As directed      Discharge instructions    Complete by:  As directed   Follow up with PCP within one week. Follow up with Orthopedics and Cardiology as soon as possible     Increase activity slowly    Complete by:  As directed   Activity as per rehab team     Weight bearing as tolerated    Complete by:  As directed             Medication List    STOP taking these medications        OVER THE COUNTER MEDICATION     polyethylene glycol powder powder  Commonly known as:  GLYCOLAX/MIRALAX  Replaced by:  polyethylene glycol packet      TAKE these medications        acetaminophen 325 MG tablet  Commonly known as:  TYLENOL  Take 325-650 mg by mouth every 6 (six) hours as needed for mild pain or moderate pain.     alum & mag hydroxide-simeth 200-200-20 MG/5ML suspension  Commonly known as:  MAALOX/MYLANTA  Take 30 mLs by mouth every 4 (four) hours as needed for indigestion.     cetirizine 10 MG tablet  Commonly known as:  ZYRTEC  Take 10 mg by mouth daily.     ELIQUIS 5 MG Tabs tablet  Generic drug:  apixaban  TAKE (1) TABLET BY MOUTH TWICE DAILY.     apixaban 5 MG Tabs tablet  Commonly known as:  ELIQUIS  Take 1 tablet (5 mg total) by mouth 2 (two) times daily.     feeding supplement (ENSURE ENLIVE) Liqd  Take 237  mLs by mouth 2 (two) times daily between meals.     HYDROcodone-acetaminophen 7.5-325 MG tablet  Commonly known as:  NORCO  Take 1-2 tablets by mouth every 6 (six) hours as needed for moderate pain.     levothyroxine 25 MCG tablet  Commonly known as:  SYNTHROID, LEVOTHROID  TAKE (1) TABLET BY MOUTH EACH MORNING.     metoprolol succinate 25 MG 24 hr tablet  Commonly known as:  TOPROL-XL  Take 0.5 tablets (12.5 mg total) by mouth daily.     polyethylene glycol packet  Commonly known as:  MIRALAX / GLYCOLAX  Take 17 g by mouth daily.           Follow-up Information    Follow up with Marianna Payment, MD In  2 weeks.   Specialty:  Orthopedic Surgery   Why:  For suture removal, For wound re-check   Contact information:   [address] [place] [address] [phone]       Signed: Bonnell Public 11/25/2015, 11:47 AM

## 2015-11-25 NOTE — Progress Notes (Signed)
Report given to Renae at [facility]

## 2015-11-25 NOTE — Progress Notes (Signed)
Physical Therapy Treatment Patient Details Name: Ethan Faulkner MRN: [id number] DOB: 29-Nov-1915 Today's Date: 11/25/2015    History of Present Illness Pt is a 82 y.o. male s/p fall with L femoral neck fx. He underwent L hip hemiarthroplasty (anterior approach) 11-22-15.    PT Comments    Pt performed increased mobility this am.  Progressed from +2-+1 this am.  Pt required assistance with therapeutic exercise and noted with BM incontinence on arrival.  Will continue PT per POC next visit.    Follow Up Recommendations  SNF;Supervision/Assistance - 24 hour     Equipment Recommendations  Rolling walker with 5" wheels    Recommendations for Other Services       Precautions / Restrictions Precautions Precautions: Fall Restrictions Weight Bearing Restrictions: Yes LLE Weight Bearing: Weight bearing as tolerated    Mobility  Bed Mobility Overal bed mobility: Needs Assistance Bed Mobility: Supine to Sit     Supine to sit: Mod assist;HOB elevated     General bed mobility comments: verbal cues for sequencing, increased time to complete.  Required assist for trunk elevation and LE advancement.  PTA used bed pad to advance patient.     Transfers Overall transfer level: Needs assistance Equipment used: Rolling walker (2 wheeled) Transfers: Sit to/from Stand Sit to Stand: Mod assist         General transfer comment: VCs for sequencing and hand placement.  pt HOH and has difficulty following commands for hand placement.  pt holding to RW during stand to sit.    Ambulation/Gait Ambulation/Gait assistance: Mod assist Ambulation Distance (Feet): 72 Feet Assistive device: Rolling walker (2 wheeled) Gait Pattern/deviations: Step-through pattern;Antalgic;Decreased stride length;Trunk flexed Gait velocity: decreased   General Gait Details: continuouse verbal cues for sequencing and posture, assist with RW management.  Close chair follow, fatigues with activity.     Stairs             Wheelchair Mobility    Modified Rankin (Stroke Patients Only)       Balance Overall balance assessment: Needs assistance   Sitting balance-Leahy Scale: Good       Standing balance-Leahy Scale: Fair                      Cognition Arousal/Alertness: Awake/alert Behavior During Therapy: WFL for tasks assessed/performed Overall Cognitive Status: Within Functional Limits for tasks assessed                      Exercises Total Joint Exercises Ankle Circles/Pumps: AROM;Both;20 reps;Supine Quad Sets: AROM;Left;10 reps;Supine Heel Slides: AAROM;Left;10 reps;Supine Hip ABduction/ADduction: AAROM;Left;10 reps;Supine Straight Leg Raises: AAROM;Left;10 reps;Supine    General Comments        Pertinent Vitals/Pain Pain Assessment: 0-10 Pain Score: 5  Pain Location: L hip Pain Descriptors / Indicators: Sore;Tightness Pain Intervention(s): Monitored during session;Repositioned    Home Living                      Prior Function            PT Goals (current goals can now be found in the care plan section) Acute Rehab PT Goals Patient Stated Goal: independence Potential to Achieve Goals: Good Progress towards PT goals: Progressing toward goals    Frequency  Min 3X/week    PT Plan Current plan remains appropriate    Co-evaluation             End of Session Equipment Utilized During  Treatment: Gait belt Activity Tolerance: Patient tolerated treatment well Patient left: in chair;with family/visitor present;with call bell/phone within reach     Time: 0841-0905 PT Time Calculation (min) (ACUTE ONLY): 24 min  Charges:  $Gait Training: 8-22 mins $Therapeutic Activity: 8-22 mins                    G Codes:      Cristela Blue December 20, 2015, 9:16 AM  Governor Rooks, PTA pager [phone]

## 2015-11-27 ENCOUNTER — Encounter: Payer: Self-pay | Admitting: Internal Medicine

## 2015-11-27 ENCOUNTER — Non-Acute Institutional Stay (SKILLED_NURSING_FACILITY): Payer: Medicare Other | Admitting: Internal Medicine

## 2015-11-27 DIAGNOSIS — S72002A Fracture of unspecified part of neck of left femur, initial encounter for closed fracture: Secondary | ICD-10-CM | POA: Diagnosis not present

## 2015-11-27 DIAGNOSIS — I4821 Permanent atrial fibrillation: Secondary | ICD-10-CM

## 2015-11-27 DIAGNOSIS — C642 Malignant neoplasm of left kidney, except renal pelvis: Secondary | ICD-10-CM | POA: Diagnosis not present

## 2015-11-27 DIAGNOSIS — I482 Chronic atrial fibrillation: Secondary | ICD-10-CM

## 2015-11-27 NOTE — Patient Instructions (Signed)
PT at [facility] 

## 2015-11-27 NOTE — Assessment & Plan Note (Signed)
Physical therapy at [facility]

## 2015-11-27 NOTE — Progress Notes (Signed)
Facility Location: [facility] Room Number: 159   PCP: Sallee Lange, MD [place] [address]     This is a comprehensive admission note to [place] personally performed by Unice Cobble MD on this date less than 30 days from date of admission. Included are preadmission medical/surgical history;reconciled medication list; family history; social history and comprehensive review of systems.  Corrections and additions to the records were documented . Comprehensive physical exam was also performed. Additionally a clinical summary was entered for each active diagnosis pertinent to this admission in the Problem List to enhance continuity of care.   HPI: The patient was hospitalized 6/28-11/25/15 with a femoral neck fracture for which he had a left hemiarthroplasty. The fracture was sustained a mechanical fall without cardiac or neurologic prodrome. Hospital course was complicated by bradycardia with heart rates in the 40-50s in context of chronic age fibrillation. The metoprolol was decreased  to 12.5 mg once daily with resolution of the pauses and normalization of the rate. Postoperatively he had anemia with hematocrit 30.1. White count was mildly reduced with a range of 101,000-115,000.  Past medical and surgical history: Significant history includes prostate cancer. He's been found to have a renal mass that is slowly enlarging. No invasive therapy has been pursued due to its slow growth rate and his advanced age. He has been on Eliquis for a fibrillation. TSH was 3.77 on 04/29/15.  Social history: Confirmed  Family history: Negative except for CVA in a sister  Comprehensive review of systems is completely negative except for acute swelling of the left lower extremity today. This occurred in the setting of prolonged physical therapy attendance with leg dependency. By history is been a dramatic improvement with application of a TED hose. 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  Respiratory: No cough, sputum production,hemoptysis, DOE , significant snoring,apnea   Gastrointestinal: No heartburn,dysphagia,abdominal pain, nausea / vomiting,rectal bleeding, melena,change in bowels Genitourinary: No dysuria,hematuria, pyuria,  incontinence, nocturia Musculoskeletal: No joint stiffness, joint swelling, weakness,pain Dermatologic: No rash, pruritus, change in appearance of skin Neurologic: No dizziness,headache,syncope, seizures, numbness , tingling Psychiatric: No significant anxiety , depression, insomnia, anorexia Endocrine: No change in hair/skin/ nails, excessive thirst, excessive hunger, excessive urination  Hematologic/lymphatic: No significant bruising, lymphadenopathy,abnormal bleeding Allergy/immunology: No itchy/ watery eyes, significant sneezing, urticaria, angioedema  Physical exam:  Pertinent or positive findings: Appears much younger than stated age. Initially he was sleeping but awoke and was oriented. He has bilateral ptosis.  Pattern alopecia is present. Complete dentures present Heart rate is irregular. No edema is present with TED hose. Pedal pulses are decreased. General appearance:Adequately nourished; no acute distress , increased work of breathing is present.   Lymphatic: No lymphadenopathy about the head, neck, axilla . Eyes: No conjunctival inflammation or lid edema is present. There is no scleral icterus. Ears:  External ear exam shows no significant lesions or deformities.   Nose:  External nasal  examination shows no deformity or inflammation. Nasal mucosa are pink and moist without lesions ,exudates Oral exam: lips and gums are healthy appearing.There is no oropharyngeal erythema or exudate . Neck:  No thyromegaly, masses, tenderness noted.  Heart:  No gallop, murmur, click, rub .  Lungs:Chest clear to auscultation without wheezes, rhonchi,rales , rubs. Abdomen:Bowel sounds are normal. Abdomen is soft and nontender with no organomegaly, hernias,masses. GU: deferred as previously addressed. Extremities:  No cyanosis, clubbing  Neurologic exam : Strength equal  in upper & lower extremities Balance,Rhomberg,finger to nose testing could not be completed due to clinical state Deep tendon reflexes are equal Skin: Warm & dry w/o tenting. No significant lesions or rash.  See clinical summary under each active problem in the Problem List with associated updated therapeutic plan

## 2015-11-27 NOTE — Assessment & Plan Note (Signed)
-   Continue Eliquis 

## 2015-11-28 ENCOUNTER — Encounter (HOSPITAL_COMMUNITY)
Admission: RE | Admit: 2015-11-28 | Discharge: 2015-11-28 | Disposition: A | Discharge status: Home / Self Care | Payer: Medicare Other | Source: Skilled Nursing Facility | Attending: Internal Medicine | Admitting: Internal Medicine

## 2015-11-28 DIAGNOSIS — C642 Malignant neoplasm of left kidney, except renal pelvis: Secondary | ICD-10-CM | POA: Insufficient documentation

## 2015-11-28 DIAGNOSIS — Z4789 Encounter for other orthopedic aftercare: Secondary | ICD-10-CM | POA: Insufficient documentation

## 2015-11-28 LAB — CBC WITH DIFFERENTIAL/PLATELET
Basophils Absolute: 0 10*3/uL (ref 0.0–0.1)
Basophils Relative: 0 %
EOS ABS: 0.1 10*3/uL (ref 0.0–0.7)
EOS PCT: 2 %
HCT: 29.4 % — ABNORMAL LOW (ref 39.0–52.0)
Hemoglobin: 9.8 g/dL — ABNORMAL LOW (ref 13.0–17.0)
LYMPHS ABS: 0.6 10*3/uL — AB (ref 0.7–4.0)
LYMPHS PCT: 13 %
MCH: 30.6 pg (ref 26.0–34.0)
MCHC: 33.3 g/dL (ref 30.0–36.0)
MCV: 91.9 fL (ref 78.0–100.0)
MONO ABS: 0.6 10*3/uL (ref 0.1–1.0)
MONOS PCT: 13 %
Neutro Abs: 3.2 10*3/uL (ref 1.7–7.7)
Neutrophils Relative %: 72 %
PLATELETS: 140 10*3/uL — AB (ref 150–400)
RBC: 3.2 MIL/uL — AB (ref 4.22–5.81)
RDW: 13.4 % (ref 11.5–15.5)
WBC: 4.4 10*3/uL (ref 4.0–10.5)

## 2015-11-28 LAB — BASIC METABOLIC PANEL
Anion gap: 4 — ABNORMAL LOW (ref 5–15)
BUN: 17 mg/dL (ref 6–20)
CALCIUM: 8.1 mg/dL — AB (ref 8.9–10.3)
CO2: 26 mmol/L (ref 22–32)
CREATININE: 0.8 mg/dL (ref 0.61–1.24)
Chloride: 103 mmol/L (ref 101–111)
GFR calc Af Amer: >60 mL/min (ref >60)
GLUCOSE: 110 mg/dL — AB (ref 65–99)
POTASSIUM: 4.2 mmol/L (ref 3.5–5.1)
SODIUM: 133 mmol/L — AB (ref 135–145)

## 2015-11-30 ENCOUNTER — Encounter (HOSPITAL_COMMUNITY)
Admission: RE | Admit: 2015-11-30 | Discharge: 2015-11-30 | Disposition: A | Discharge status: Home / Self Care | Payer: Medicare Other | Source: Skilled Nursing Facility | Attending: *Deleted | Admitting: *Deleted

## 2015-11-30 ENCOUNTER — Inpatient Hospital Stay (HOSPITAL_COMMUNITY): Admission: RE | Admit: 2015-11-30 | Payer: Self-pay | Source: Skilled Nursing Facility

## 2015-11-30 LAB — CBC WITH DIFFERENTIAL/PLATELET
BASOS PCT: 0 %
Basophils Absolute: 0 10*3/uL (ref 0.0–0.1)
Eosinophils Absolute: 0.2 10*3/uL (ref 0.0–0.7)
Eosinophils Relative: 4 %
HEMATOCRIT: 32.6 % — AB (ref 39.0–52.0)
HEMOGLOBIN: 10.8 g/dL — AB (ref 13.0–17.0)
Lymphocytes Relative: 18 %
Lymphs Abs: 1 10*3/uL (ref 0.7–4.0)
MCH: 30.8 pg (ref 26.0–34.0)
MCHC: 33.1 g/dL (ref 30.0–36.0)
MCV: 92.9 fL (ref 78.0–100.0)
MONOS PCT: 12 %
Monocytes Absolute: 0.6 10*3/uL (ref 0.1–1.0)
NEUTROS ABS: 3.5 10*3/uL (ref 1.7–7.7)
NEUTROS PCT: 66 %
Platelets: 222 10*3/uL (ref 150–400)
RBC: 3.51 MIL/uL — ABNORMAL LOW (ref 4.22–5.81)
RDW: 13.6 % (ref 11.5–15.5)
WBC: 5.3 10*3/uL (ref 4.0–10.5)

## 2015-12-05 DIAGNOSIS — S72042D Displaced fracture of base of neck of left femur, subsequent encounter for closed fracture with routine healing: Secondary | ICD-10-CM | POA: Diagnosis not present

## 2015-12-25 ENCOUNTER — Non-Acute Institutional Stay (SKILLED_NURSING_FACILITY): Payer: Medicare Other | Admitting: Internal Medicine

## 2015-12-25 ENCOUNTER — Encounter: Payer: Self-pay | Admitting: Internal Medicine

## 2015-12-25 DIAGNOSIS — I482 Chronic atrial fibrillation, unspecified: Secondary | ICD-10-CM

## 2015-12-25 DIAGNOSIS — D62 Acute posthemorrhagic anemia: Secondary | ICD-10-CM

## 2015-12-25 DIAGNOSIS — S72002D Fracture of unspecified part of neck of left femur, subsequent encounter for closed fracture with routine healing: Secondary | ICD-10-CM

## 2015-12-25 NOTE — Progress Notes (Signed)
Location:   [facility] Room Number: 159/P Place of Service:  SNF [phone])  Provider: Granville Lewis  PCP: Sallee Lange, MD Patient Care Team: Kathyrn Drown, MD as PCP - General (Family Medicine) Herminio Commons, MD as Attending Physician (Cardiology)  Extended Emergency Contact Information Primary Emergency Contact: Maeola Sarah of [place] Phone: [phone] Relation: Daughter Secondary Emergency Contact: Pegram,[name] Address: [address]          [place], [place] [address] [place] Phone: [phone] Work Phone: [phone] Mobile Phone: [phone] Relation: Daughter  Code Status: Full Code Goals of care:  Advanced Directive information Advanced Directives 12/25/2015  Does patient have an advance directive? Yes  Type of Advance Directive (No Data)  Does patient want to make changes to advanced directive? No - Patient declined  Copy of advanced directive(s) in chart? Yes  Would patient like information on creating an advanced directive? -     Allergies  Allergen Reactions  . Xanax [Alprazolam] Other (See Comments)    Dizzy, Sedated  . Tape Rash    PATIENT'S SKIN WILL TEAR/PLEASE EITHER USE PAPER TAPE OR COBAN WRAP!!    Chief Complaint  Patient presents with  . Discharge Note    HPI:  82 y.o. male  seen today for discharge-he was here for rehabilitation after being hospitalized from June 28 to 11/25/2015 with a femoral neck fracture status post left heme oh arthroplasty.  Fracture was sustained after a fall.  Hospital course was complicated by bradycardia with heart rates in the 40s and 50s-she does have a history of chronic atrial fibrillation and metoprolol was decreased down to 12.5 mg once a day and this apparently resolved the bradycardia.  Postop he had anemia with a hematocrit of 30.1 platelets were reduced slightly in the range of 101,000 115,000---- however this normalized at 222,000 on  lab done 11/30/2015-it was up to 32.5 hemoglobin of 10.8.  He has done well and is post op course she is ambulating now with a walker he will need continued PT and OT at home-also will need a rolling walker-he does have extensive family support although he does live alone-family is actually in the room with him today again they are extremely supportive and is actually helping quite a bit while he was here as well  Currently patient has no complaints vital signs appear to be stable atrial fibrillation appears to be rate controlled he does not complain of any shortness of breath or chest pain in regards to hip pain he hydrocodone appears to be effective he does not use it much    Past Medical History:  Diagnosis Date  . Afib ([place])   . Hypertension   . Hypothyroidism   . Migraine    "maybe monthly" (11/21/2015)  . Prostate cancer ([place]) ~ 1995   S/P radiation tx  . Renal mass    "slow growing something; doctors just watching it right now" (11/21/2015)  . Thyroid disease    hypothyroid    Past Surgical History:  Procedure Laterality Date  . COLONOSCOPY  1999  . ESOPHAGOGASTRODUODENOSCOPY    . LAPAROSCOPIC CHOLECYSTECTOMY  1980s  . PROSTATE BIOPSY  ~ 1995  . TOTAL HIP ARTHROPLASTY Left 11/22/2015   Procedure: LEFT HIP HEMIARTHROPLASTY ;  Surgeon: Leandrew Koyanagi, MD;  Location: [facility];  Service: Orthopedics;  Laterality: Left;      reports that he has quit smoking. He has never used smokeless tobacco. He reports  that he drinks alcohol. He reports that he does not use drugs. Social History   Social History  . Marital status: Widowed    Spouse name: N/A  . Number of children: N/A  . Years of education: N/A   Occupational History  . Not on file.   Social History Main Topics  . Smoking status: Former Research scientist (life sciences)  . Smokeless tobacco: Never Used     Comment: 11/21/2015 "might have smoked 2 packs of cigarettes in my lifetime"  . Alcohol use Yes     Comment: 11/21/2015 "last beer I've had was  in ~ 2007"  . Drug use: No  . Sexual activity: No   Other Topics Concern  . Not on file   Social History Narrative  . No narrative on file   Functional Status Survey:    Allergies  Allergen Reactions  . Xanax [Alprazolam] Other (See Comments)    Dizzy, Sedated  . Tape Rash    PATIENT'S SKIN WILL TEAR/PLEASE EITHER USE PAPER TAPE OR COBAN WRAP!!    Pertinent  Health Maintenance Due  Topic Date Due  . INFLUENZA VACCINE  12/24/2015  . PNA vac Low Risk Adult  Completed    Medications: Current Outpatient Prescriptions on File Prior to Visit  Medication Sig Dispense Refill  . acetaminophen (TYLENOL) 325 MG tablet Take 325-650 mg by mouth every 6 (six) hours as needed for mild pain or moderate pain.    Marland Kitchen alum & mag hydroxide-simeth (MAALOX/MYLANTA) 200-200-20 MG/5ML suspension Take 30 mLs by mouth every 4 (four) hours as needed for indigestion. 355 mL 0  . apixaban (ELIQUIS) 5 MG TABS tablet Take 1 tablet (5 mg total) by mouth 2 (two) times daily. 30 tablet 0  . cetirizine (ZYRTEC) 10 MG tablet Take 10 mg by mouth daily.    Marland Kitchen ELIQUIS 5 MG TABS tablet TAKE (1) TABLET BY MOUTH TWICE DAILY. 60 tablet 1  . feeding supplement, ENSURE ENLIVE, (ENSURE ENLIVE) LIQD Take 237 mLs by mouth 2 (two) times daily between meals. 237 mL 12  . HYDROcodone-acetaminophen (NORCO) 7.5-325 MG tablet Take 1-2 tablets by mouth every 6 (six) hours as needed for moderate pain. 120 tablet 0  . levothyroxine (SYNTHROID, LEVOTHROID) 25 MCG tablet TAKE (1) TABLET BY MOUTH EACH MORNING. 30 tablet 0  . metoprolol succinate (TOPROL-XL) 25 MG 24 hr tablet Take 0.5 tablets (12.5 mg total) by mouth daily. 30 tablet 1  . polyethylene glycol (MIRALAX / GLYCOLAX) packet Take 17 g by mouth daily. 14 each 0   No current facility-administered medications on file prior to visit.      Review of Systems   General does not complain of fever or chills.  Skin does not complain of rashes or itching surgical site left hip  appears to be quite benign.  Head ears eyes nose mouth and throat does not complain of visual changes has prescription lenses does not complain of sore throat.  Respiratory does not complain of shortness breath or cough.  Cardiac does not complain of chest pain or increased palpitations or lower extremity edema has compression hose on bilaterally.  GI is not complaining of nausea vomiting diarrhea constipation or abdominal discomfort.  GU is not complaining of dysuria.  Muscle skeletal again has rehabbed well is not currently complaining of joint pain does have hydrocodone on an as needed.  Neurologic is not complaining of dizziness headache syncope or numbness.   psych is not complaining of any anxiety or depression  Vitals:  12/25/15 1427  BP: 126/70  Pulse: 82  Resp: 20  Temp: 98.4 F (36.9 C)  TempSrc: Oral  Weight: 155 lb 1.6 oz (70.4 kg)  Height: 5\' 10"  (1.778 m)   Body mass index is 22.25 kg/m. Physical Exam   In general this is a very pleasant elderly male who looks younger than stated age.  His skin is warm and dry.  Eyes pupils appear reactive light sclera and conjunctiva are clear visual acuity appears grossly intact he has prescription lenses.  Oropharynx is clear mucous membranes moist she has dentures.  Chest is clear to auscultation with somewhat shallow air entry there is no labored breathing.  Heart is regular irregular rate and rhythm without murmur gallop or rub he does not have significant lower extremity edema TED hose are applied bilaterally pedal pulses are somewhat reduced.  Abdomen is soft nontender positive bowel sounds.  Muscle skeletal moves all extremities 4 to appears her baseline strength in all 4 extremities he is ambulating now with a walker and appears to be doing well with this although still has some weakness.  Neurologic is grossly intact no lateralizing findings her speech is clear.  Psych he is alert and oriented pleasant and  appropriate.      Labs reviewed: Basic Metabolic Panel:  Recent Labs  11/23/15 0813 11/24/15 0407 11/28/15 0755  NA 135 132* 133*  K 4.2 4.0 4.2  CL 104 105 103  CO2 24 22 26   GLUCOSE 122* 92 110*  BUN 16 16 17   CREATININE 1.04 0.98 0.80  CALCIUM 8.6* 8.3* 8.1*   Liver Function Tests: No results for input(s): AST, ALT, ALKPHOS, BILITOT, PROT, ALBUMIN in the last 8760 hours. No results for input(s): LIPASE, AMYLASE in the last 8760 hours. No results for input(s): AMMONIA in the last 8760 hours. CBC:  Recent Labs  11/20/15 1920  11/25/15 0227 11/28/15 0755 11/30/15 0910  WBC 4.6  < > 5.5 4.4 5.3  NEUTROABS 2.7  --   --  3.2 3.5  HGB 14.3  < > 10.0* 9.8* 10.8*  HCT 42.9  < > 30.1* 29.4* 32.6*  MCV 91.3  < > 91.8 91.9 92.9  PLT 135*  < > 112* 140* 222  < > = values in this interval not displayed. Cardiac Enzymes: No results for input(s): CKTOTAL, CKMB, CKMBINDEX, TROPONINI in the last 8760 hours. BNP: Invalid input(s): POCBNP CBG: No results for input(s): GLUCAP in the last 8760 hours.  Procedures and Imaging Studies During Stay: No results found.  Assessment/Plan:    #1 history of left hip fracture with repair-she has done well with this is now ambulating with a rolling walker will need continued PT and OT he is receiving Vicodin with relief-she also continues on calcium.  He is on  Eliquist for anticoagulation again he is also on this for atrial fibrillation.  #2 postop anemia he is on iron hemoglobin appears to be rising we will update this before discharge.  #3 history of atrial fibrillation he continues on metoprolol this appears to be rate controlled also is on  ELIQUIST--for anticoagulation.  #4-history of hypothyroidism he is on Synthroid-since patient stay here is been quite sHORT was not aggressive pursuing a TSH this will need follow-up by primary care provider.        Patient is being discharged with the following home health services:   PT and OT  Patient is being discharged with the following durable medical equipment:  Rolling walker  Patient has  been advised to f/u with their PCP in 1-2 weeks to bring them up to date on their rehab stay.  Social services at facility was responsible for arranging this appointment.  Pt was provided with a 30 day supply of prescriptions for medications and refills must be obtained from their PCP.  For controlled substances, a more limited supply may be provided adequate until PCP appointment only.  Future labs/tests needed:  Will update CBC BMP TSH before discharge with primary care provider notified of results   [phone] note greater than 30 minutes spent on this discharge summary-greater than 50% of time spent coordinating plan of care including assessing patient reviewing his chart-reviewing his labs-and discussion with family at bedside as well as with nursing-of note greater than 50% of time spent coordinating plan of care

## 2015-12-26 ENCOUNTER — Encounter (HOSPITAL_COMMUNITY)
Admission: RE | Admit: 2015-12-26 | Discharge: 2015-12-26 | Disposition: A | Discharge status: Home / Self Care | Payer: Medicare Other | Source: Skilled Nursing Facility | Attending: Internal Medicine | Admitting: Internal Medicine

## 2015-12-26 ENCOUNTER — Telehealth: Payer: Self-pay

## 2015-12-26 LAB — BASIC METABOLIC PANEL
Anion gap: 6 (ref 5–15)
BUN: 19 mg/dL (ref 6–20)
CHLORIDE: 106 mmol/L (ref 101–111)
CO2: 26 mmol/L (ref 22–32)
Calcium: 9.2 mg/dL (ref 8.9–10.3)
Creatinine, Ser: 1.04 mg/dL (ref 0.61–1.24)
GFR calc Af Amer: >60 mL/min (ref >60)
GFR, EST NON AFRICAN AMERICAN: 58 mL/min — AB (ref >60)
GLUCOSE: 116 mg/dL — AB (ref 65–99)
POTASSIUM: 4.2 mmol/L (ref 3.5–5.1)
Sodium: 138 mmol/L (ref 135–145)

## 2015-12-26 LAB — CBC
HEMATOCRIT: 36.1 % — AB (ref 39.0–52.0)
HEMOGLOBIN: 11.9 g/dL — AB (ref 13.0–17.0)
MCH: 31.1 pg (ref 26.0–34.0)
MCHC: 33 g/dL (ref 30.0–36.0)
MCV: 94.3 fL (ref 78.0–100.0)
Platelets: 148 10*3/uL — ABNORMAL LOW (ref 150–400)
RBC: 3.83 MIL/uL — ABNORMAL LOW (ref 4.22–5.81)
RDW: 14.7 % (ref 11.5–15.5)
WBC: 3.6 10*3/uL — ABNORMAL LOW (ref 4.0–10.5)

## 2015-12-26 LAB — TSH: TSH: 2.403 u[IU]/mL (ref 0.350–4.500)

## 2015-12-26 MED ORDER — HYDROCODONE-ACETAMINOPHEN 7.5-325 MG PO TABS: 1.0000 | ORAL_TABLET | Freq: Four times a day (QID) | ORAL | 0 refills | Status: DC | PRN

## 2015-12-26 NOTE — Telephone Encounter (Signed)
RX faxed to [facility] @ [phone]. Phone number [phone]  

## 2015-12-27 DIAGNOSIS — E039 Hypothyroidism, unspecified: Secondary | ICD-10-CM | POA: Diagnosis not present

## 2015-12-27 DIAGNOSIS — Z87891 Personal history of nicotine dependence: Secondary | ICD-10-CM | POA: Diagnosis not present

## 2015-12-27 DIAGNOSIS — I4891 Unspecified atrial fibrillation: Secondary | ICD-10-CM | POA: Diagnosis not present

## 2015-12-27 DIAGNOSIS — I10 Essential (primary) hypertension: Secondary | ICD-10-CM | POA: Diagnosis not present

## 2015-12-27 DIAGNOSIS — Z7901 Long term (current) use of anticoagulants: Secondary | ICD-10-CM | POA: Diagnosis not present

## 2015-12-27 DIAGNOSIS — Z96642 Presence of left artificial hip joint: Secondary | ICD-10-CM | POA: Diagnosis not present

## 2015-12-27 DIAGNOSIS — D649 Anemia, unspecified: Secondary | ICD-10-CM | POA: Diagnosis not present

## 2015-12-27 DIAGNOSIS — C61 Malignant neoplasm of prostate: Secondary | ICD-10-CM | POA: Diagnosis not present

## 2015-12-27 DIAGNOSIS — S72002D Fracture of unspecified part of neck of left femur, subsequent encounter for closed fracture with routine healing: Secondary | ICD-10-CM | POA: Diagnosis not present

## 2015-12-27 DIAGNOSIS — N2889 Other specified disorders of kidney and ureter: Secondary | ICD-10-CM | POA: Diagnosis not present

## 2015-12-27 DIAGNOSIS — Z9181 History of falling: Secondary | ICD-10-CM | POA: Diagnosis not present

## 2015-12-30 DIAGNOSIS — I4891 Unspecified atrial fibrillation: Secondary | ICD-10-CM | POA: Diagnosis not present

## 2015-12-30 DIAGNOSIS — N2889 Other specified disorders of kidney and ureter: Secondary | ICD-10-CM | POA: Diagnosis not present

## 2015-12-30 DIAGNOSIS — S72002D Fracture of unspecified part of neck of left femur, subsequent encounter for closed fracture with routine healing: Secondary | ICD-10-CM | POA: Diagnosis not present

## 2015-12-30 DIAGNOSIS — E039 Hypothyroidism, unspecified: Secondary | ICD-10-CM | POA: Diagnosis not present

## 2015-12-30 DIAGNOSIS — D649 Anemia, unspecified: Secondary | ICD-10-CM | POA: Diagnosis not present

## 2015-12-30 DIAGNOSIS — I10 Essential (primary) hypertension: Secondary | ICD-10-CM | POA: Diagnosis not present

## 2015-12-31 DIAGNOSIS — N2889 Other specified disorders of kidney and ureter: Secondary | ICD-10-CM | POA: Diagnosis not present

## 2015-12-31 DIAGNOSIS — E039 Hypothyroidism, unspecified: Secondary | ICD-10-CM | POA: Diagnosis not present

## 2015-12-31 DIAGNOSIS — I10 Essential (primary) hypertension: Secondary | ICD-10-CM | POA: Diagnosis not present

## 2015-12-31 DIAGNOSIS — D649 Anemia, unspecified: Secondary | ICD-10-CM | POA: Diagnosis not present

## 2015-12-31 DIAGNOSIS — S72002D Fracture of unspecified part of neck of left femur, subsequent encounter for closed fracture with routine healing: Secondary | ICD-10-CM | POA: Diagnosis not present

## 2015-12-31 DIAGNOSIS — I4891 Unspecified atrial fibrillation: Secondary | ICD-10-CM | POA: Diagnosis not present

## 2016-01-02 DIAGNOSIS — I10 Essential (primary) hypertension: Secondary | ICD-10-CM | POA: Diagnosis not present

## 2016-01-02 DIAGNOSIS — I4891 Unspecified atrial fibrillation: Secondary | ICD-10-CM | POA: Diagnosis not present

## 2016-01-02 DIAGNOSIS — E039 Hypothyroidism, unspecified: Secondary | ICD-10-CM | POA: Diagnosis not present

## 2016-01-02 DIAGNOSIS — S72002D Fracture of unspecified part of neck of left femur, subsequent encounter for closed fracture with routine healing: Secondary | ICD-10-CM | POA: Diagnosis not present

## 2016-01-02 DIAGNOSIS — D649 Anemia, unspecified: Secondary | ICD-10-CM | POA: Diagnosis not present

## 2016-01-02 DIAGNOSIS — N2889 Other specified disorders of kidney and ureter: Secondary | ICD-10-CM | POA: Diagnosis not present

## 2016-01-06 DIAGNOSIS — I10 Essential (primary) hypertension: Secondary | ICD-10-CM | POA: Diagnosis not present

## 2016-01-06 DIAGNOSIS — N2889 Other specified disorders of kidney and ureter: Secondary | ICD-10-CM | POA: Diagnosis not present

## 2016-01-06 DIAGNOSIS — I4891 Unspecified atrial fibrillation: Secondary | ICD-10-CM | POA: Diagnosis not present

## 2016-01-06 DIAGNOSIS — E039 Hypothyroidism, unspecified: Secondary | ICD-10-CM | POA: Diagnosis not present

## 2016-01-06 DIAGNOSIS — D649 Anemia, unspecified: Secondary | ICD-10-CM | POA: Diagnosis not present

## 2016-01-06 DIAGNOSIS — S72002D Fracture of unspecified part of neck of left femur, subsequent encounter for closed fracture with routine healing: Secondary | ICD-10-CM | POA: Diagnosis not present

## 2016-01-08 DIAGNOSIS — S72002D Fracture of unspecified part of neck of left femur, subsequent encounter for closed fracture with routine healing: Secondary | ICD-10-CM | POA: Diagnosis not present

## 2016-01-08 DIAGNOSIS — I10 Essential (primary) hypertension: Secondary | ICD-10-CM | POA: Diagnosis not present

## 2016-01-08 DIAGNOSIS — N2889 Other specified disorders of kidney and ureter: Secondary | ICD-10-CM | POA: Diagnosis not present

## 2016-01-08 DIAGNOSIS — D649 Anemia, unspecified: Secondary | ICD-10-CM | POA: Diagnosis not present

## 2016-01-08 DIAGNOSIS — E039 Hypothyroidism, unspecified: Secondary | ICD-10-CM | POA: Diagnosis not present

## 2016-01-08 DIAGNOSIS — I4891 Unspecified atrial fibrillation: Secondary | ICD-10-CM | POA: Diagnosis not present

## 2016-01-09 DIAGNOSIS — E039 Hypothyroidism, unspecified: Secondary | ICD-10-CM | POA: Diagnosis not present

## 2016-01-09 DIAGNOSIS — N2889 Other specified disorders of kidney and ureter: Secondary | ICD-10-CM | POA: Diagnosis not present

## 2016-01-09 DIAGNOSIS — I4891 Unspecified atrial fibrillation: Secondary | ICD-10-CM | POA: Diagnosis not present

## 2016-01-09 DIAGNOSIS — S72002D Fracture of unspecified part of neck of left femur, subsequent encounter for closed fracture with routine healing: Secondary | ICD-10-CM | POA: Diagnosis not present

## 2016-01-09 DIAGNOSIS — D649 Anemia, unspecified: Secondary | ICD-10-CM | POA: Diagnosis not present

## 2016-01-09 DIAGNOSIS — I10 Essential (primary) hypertension: Secondary | ICD-10-CM | POA: Diagnosis not present

## 2016-01-10 DIAGNOSIS — D649 Anemia, unspecified: Secondary | ICD-10-CM | POA: Diagnosis not present

## 2016-01-10 DIAGNOSIS — I10 Essential (primary) hypertension: Secondary | ICD-10-CM | POA: Diagnosis not present

## 2016-01-10 DIAGNOSIS — S72002D Fracture of unspecified part of neck of left femur, subsequent encounter for closed fracture with routine healing: Secondary | ICD-10-CM | POA: Diagnosis not present

## 2016-01-10 DIAGNOSIS — E039 Hypothyroidism, unspecified: Secondary | ICD-10-CM | POA: Diagnosis not present

## 2016-01-10 DIAGNOSIS — I4891 Unspecified atrial fibrillation: Secondary | ICD-10-CM | POA: Diagnosis not present

## 2016-01-10 DIAGNOSIS — N2889 Other specified disorders of kidney and ureter: Secondary | ICD-10-CM | POA: Diagnosis not present

## 2016-01-13 ENCOUNTER — Ambulatory Visit (INDEPENDENT_AMBULATORY_CARE_PROVIDER_SITE_OTHER): Payer: Medicare Other | Admitting: Family Medicine

## 2016-01-13 ENCOUNTER — Encounter: Payer: Self-pay | Admitting: Family Medicine

## 2016-01-13 VITALS — BP 102/76 | Ht 70.0 in | Wt 154.5 lb

## 2016-01-13 DIAGNOSIS — D649 Anemia, unspecified: Secondary | ICD-10-CM | POA: Diagnosis not present

## 2016-01-13 DIAGNOSIS — M810 Age-related osteoporosis without current pathological fracture: Secondary | ICD-10-CM | POA: Diagnosis not present

## 2016-01-13 DIAGNOSIS — I4891 Unspecified atrial fibrillation: Secondary | ICD-10-CM | POA: Diagnosis not present

## 2016-01-13 DIAGNOSIS — Z13820 Encounter for screening for osteoporosis: Secondary | ICD-10-CM

## 2016-01-13 DIAGNOSIS — N2889 Other specified disorders of kidney and ureter: Secondary | ICD-10-CM | POA: Diagnosis not present

## 2016-01-13 DIAGNOSIS — E039 Hypothyroidism, unspecified: Secondary | ICD-10-CM | POA: Diagnosis not present

## 2016-01-13 DIAGNOSIS — S72002D Fracture of unspecified part of neck of left femur, subsequent encounter for closed fracture with routine healing: Secondary | ICD-10-CM | POA: Diagnosis not present

## 2016-01-13 DIAGNOSIS — I10 Essential (primary) hypertension: Secondary | ICD-10-CM | POA: Diagnosis not present

## 2016-01-13 MED ORDER — LEVOTHYROXINE SODIUM 25 MCG PO TABS: ORAL_TABLET | ORAL | 5 refills | Status: DC

## 2016-01-13 MED ORDER — METOPROLOL SUCCINATE ER 25 MG PO TB24: 25.0000 mg | ORAL_TABLET | Freq: Every day | ORAL | 5 refills | Status: DC

## 2016-01-13 MED ORDER — APIXABAN 5 MG PO TABS: ORAL_TABLET | ORAL | 5 refills | Status: DC

## 2016-01-13 NOTE — Progress Notes (Signed)
   Subjective:    Patient ID: Ethan Faulkner, male    DOB: 1916-02-15, 82 y.o.   MRN: [id number]  HPI Patient is here today for a follow up from the nursing facility after his fall. Patient is doing very well. Patient needs his ears cleaned out also.    Patient has no new concerns at this time.  Patient overall doing well taking medication. Watching diet. No new falls. No injury. Heart doing well.  Review of Systems Patient denies any severe hip pain no shortness breath chest tightness pressure pain    Objective:   Physical Exam Lungs clear heart irregular some wax in both ears not severe extremities no edema       Assessment & Plan:  Hip fracture, by definition osteoporosis, bone density pending  Atrial fibrillationcontinue Eliquis to prevent strokes no sign of bleeding  Face-to-face evaluation done today patient would benefit from home physical therapy it would be very difficult for this patient to get out of his home surroundings he cannot drive he uses a walker and he is dependent upon others

## 2016-01-15 DIAGNOSIS — D649 Anemia, unspecified: Secondary | ICD-10-CM | POA: Diagnosis not present

## 2016-01-15 DIAGNOSIS — S72002D Fracture of unspecified part of neck of left femur, subsequent encounter for closed fracture with routine healing: Secondary | ICD-10-CM | POA: Diagnosis not present

## 2016-01-15 DIAGNOSIS — E039 Hypothyroidism, unspecified: Secondary | ICD-10-CM | POA: Diagnosis not present

## 2016-01-15 DIAGNOSIS — I4891 Unspecified atrial fibrillation: Secondary | ICD-10-CM | POA: Diagnosis not present

## 2016-01-15 DIAGNOSIS — N2889 Other specified disorders of kidney and ureter: Secondary | ICD-10-CM | POA: Diagnosis not present

## 2016-01-15 DIAGNOSIS — I10 Essential (primary) hypertension: Secondary | ICD-10-CM | POA: Diagnosis not present

## 2016-01-17 ENCOUNTER — Ambulatory Visit (INDEPENDENT_AMBULATORY_CARE_PROVIDER_SITE_OTHER): Payer: Medicare Other | Admitting: Urology

## 2016-01-17 DIAGNOSIS — D4102 Neoplasm of uncertain behavior of left kidney: Secondary | ICD-10-CM

## 2016-01-17 DIAGNOSIS — Z8546 Personal history of malignant neoplasm of prostate: Secondary | ICD-10-CM

## 2016-01-17 DIAGNOSIS — R310 Gross hematuria: Secondary | ICD-10-CM | POA: Diagnosis not present

## 2016-01-17 DIAGNOSIS — N3040 Irradiation cystitis without hematuria: Secondary | ICD-10-CM | POA: Diagnosis not present

## 2016-01-20 DIAGNOSIS — S72002D Fracture of unspecified part of neck of left femur, subsequent encounter for closed fracture with routine healing: Secondary | ICD-10-CM | POA: Diagnosis not present

## 2016-01-20 DIAGNOSIS — N2889 Other specified disorders of kidney and ureter: Secondary | ICD-10-CM | POA: Diagnosis not present

## 2016-01-20 DIAGNOSIS — I4891 Unspecified atrial fibrillation: Secondary | ICD-10-CM | POA: Diagnosis not present

## 2016-01-20 DIAGNOSIS — E039 Hypothyroidism, unspecified: Secondary | ICD-10-CM | POA: Diagnosis not present

## 2016-01-20 DIAGNOSIS — D649 Anemia, unspecified: Secondary | ICD-10-CM | POA: Diagnosis not present

## 2016-01-20 DIAGNOSIS — I10 Essential (primary) hypertension: Secondary | ICD-10-CM | POA: Diagnosis not present

## 2016-01-22 ENCOUNTER — Ambulatory Visit (HOSPITAL_COMMUNITY)
Admission: RE | Admit: 2016-01-22 | Discharge: 2016-01-22 | Disposition: A | Discharge status: Home / Self Care | Payer: Medicare Other | Source: Ambulatory Visit | Attending: Family Medicine | Admitting: Family Medicine

## 2016-01-22 DIAGNOSIS — M810 Age-related osteoporosis without current pathological fracture: Secondary | ICD-10-CM | POA: Insufficient documentation

## 2016-01-23 DIAGNOSIS — Z7901 Long term (current) use of anticoagulants: Secondary | ICD-10-CM | POA: Diagnosis not present

## 2016-01-23 DIAGNOSIS — S72002D Fracture of unspecified part of neck of left femur, subsequent encounter for closed fracture with routine healing: Secondary | ICD-10-CM | POA: Diagnosis not present

## 2016-01-23 DIAGNOSIS — C61 Malignant neoplasm of prostate: Secondary | ICD-10-CM | POA: Diagnosis not present

## 2016-01-23 DIAGNOSIS — Z96642 Presence of left artificial hip joint: Secondary | ICD-10-CM | POA: Diagnosis not present

## 2016-01-23 DIAGNOSIS — Z87891 Personal history of nicotine dependence: Secondary | ICD-10-CM

## 2016-01-23 DIAGNOSIS — Z9181 History of falling: Secondary | ICD-10-CM

## 2016-01-23 DIAGNOSIS — D649 Anemia, unspecified: Secondary | ICD-10-CM | POA: Diagnosis not present

## 2016-01-23 DIAGNOSIS — N2889 Other specified disorders of kidney and ureter: Secondary | ICD-10-CM | POA: Diagnosis not present

## 2016-01-23 DIAGNOSIS — I4891 Unspecified atrial fibrillation: Secondary | ICD-10-CM | POA: Diagnosis not present

## 2016-01-23 DIAGNOSIS — I10 Essential (primary) hypertension: Secondary | ICD-10-CM | POA: Diagnosis not present

## 2016-01-23 DIAGNOSIS — E039 Hypothyroidism, unspecified: Secondary | ICD-10-CM

## 2016-02-18 DIAGNOSIS — D649 Anemia, unspecified: Secondary | ICD-10-CM | POA: Diagnosis not present

## 2016-02-18 DIAGNOSIS — I4891 Unspecified atrial fibrillation: Secondary | ICD-10-CM | POA: Diagnosis not present

## 2016-02-18 DIAGNOSIS — I10 Essential (primary) hypertension: Secondary | ICD-10-CM | POA: Diagnosis not present

## 2016-02-18 DIAGNOSIS — S72002D Fracture of unspecified part of neck of left femur, subsequent encounter for closed fracture with routine healing: Secondary | ICD-10-CM | POA: Diagnosis not present

## 2016-02-26 ENCOUNTER — Telehealth: Payer: Self-pay | Admitting: *Deleted

## 2016-02-26 ENCOUNTER — Ambulatory Visit (INDEPENDENT_AMBULATORY_CARE_PROVIDER_SITE_OTHER): Payer: Medicare Other | Admitting: *Deleted

## 2016-02-26 DIAGNOSIS — Z23 Encounter for immunization: Secondary | ICD-10-CM | POA: Diagnosis not present

## 2016-02-26 NOTE — Telephone Encounter (Signed)
Pt came in today to get flu vaccine with son. Son states he was also suppose to get bone density results while here. Explained to son that dr Nicki Reaper is off today and he was just scheduled with the nurse. Bone density results states to come in and discuss and to get flu vaccine at same time. Does pt need schedule another appt or can we call with results.

## 2016-02-26 NOTE — Telephone Encounter (Signed)
This is more complex than a phone call, I rec routine ov within 30 days to discuss pros and cons of the approach to osteoporosis

## 2016-02-26 NOTE — Telephone Encounter (Signed)
Tried to call no answer

## 2016-02-27 ENCOUNTER — Telehealth: Payer: Self-pay | Admitting: Family Medicine

## 2016-02-27 NOTE — Telephone Encounter (Signed)
Discussed with pt's daughter [name]. She states she will schedule office visit to discuss bone density results.

## 2016-02-27 NOTE — Telephone Encounter (Signed)
done

## 2016-02-27 NOTE — Telephone Encounter (Signed)
Patient's daughter [name] dropped off two  handicap forms to be filled out. Please contact daughter when form is completed. Form in yellow folder in [place] office

## 2016-02-27 NOTE — Telephone Encounter (Signed)
Spoke with patient's daughter and informed her that forms were ready for pick up. Patient's daughter verbalized understanding.

## 2016-03-04 ENCOUNTER — Other Ambulatory Visit: Payer: Self-pay | Admitting: Family Medicine

## 2016-03-05 ENCOUNTER — Ambulatory Visit (INDEPENDENT_AMBULATORY_CARE_PROVIDER_SITE_OTHER): Payer: Medicare Other | Admitting: Orthopaedic Surgery

## 2016-03-05 DIAGNOSIS — S72042D Displaced fracture of base of neck of left femur, subsequent encounter for closed fracture with routine healing: Secondary | ICD-10-CM | POA: Diagnosis not present

## 2016-03-09 ENCOUNTER — Ambulatory Visit (INDEPENDENT_AMBULATORY_CARE_PROVIDER_SITE_OTHER): Payer: Medicare Other | Admitting: Family Medicine

## 2016-03-09 ENCOUNTER — Encounter: Payer: Self-pay | Admitting: Family Medicine

## 2016-03-09 VITALS — BP 102/80 | Ht 70.0 in | Wt 159.4 lb

## 2016-03-09 DIAGNOSIS — M818 Other osteoporosis without current pathological fracture: Secondary | ICD-10-CM | POA: Diagnosis not present

## 2016-03-09 MED ORDER — ALENDRONATE SODIUM 70 MG PO TABS: 70.0000 mg | ORAL_TABLET | ORAL | 11 refills | Status: DC

## 2016-03-09 NOTE — Progress Notes (Signed)
   Subjective:    Patient ID: Ethan Faulkner, male    DOB: 25-May-1916, 82 y.o.   MRN: [id number]  HPI Patient is here today to discuss recent bone density results. Patient is with his daughter [name]). Patient has no other concerns at this time.   Patient suffered a hip fracture several months ago had surgery remarkably much better had also bone density which showed osteoporosis  Review of Systems  Constitutional: Negative for activity change, fatigue and fever.  Respiratory: Negative for cough and shortness of breath.   Cardiovascular: Negative for chest pain and leg swelling.  Neurological: Negative for headaches.       Objective:   Physical Exam  Constitutional: He appears well-nourished. No distress.  Cardiovascular: Normal rate, regular rhythm and normal heart sounds.   No murmur heard. Pulmonary/Chest: Effort normal and breath sounds normal. No respiratory distress.  Musculoskeletal: He exhibits no edema.  Lymphadenopathy:    He has no cervical adenopathy.  Neurological: He is alert.  Psychiatric: His behavior is normal.  Vitals reviewed.         Assessment & Plan:  20 minutes spent today discussing osteoporosis discussing the pros and cons of medication in the proper way to take it his daughter was also present after long discussion patient consented to treatment Fosamax 70 mg once a week. Patient also will follow-up on a regular basis for his regular health checkups we'll do another bone density in 2 years time if he has esophageal issues with the medicine he will stop it follow-up

## 2016-03-26 ENCOUNTER — Ambulatory Visit (INDEPENDENT_AMBULATORY_CARE_PROVIDER_SITE_OTHER): Payer: Medicare Other | Admitting: Otolaryngology

## 2016-03-26 DIAGNOSIS — H903 Sensorineural hearing loss, bilateral: Secondary | ICD-10-CM | POA: Diagnosis not present

## 2016-03-26 DIAGNOSIS — H6123 Impacted cerumen, bilateral: Secondary | ICD-10-CM | POA: Diagnosis not present

## 2016-04-21 DIAGNOSIS — H5203 Hypermetropia, bilateral: Secondary | ICD-10-CM | POA: Diagnosis not present

## 2016-04-21 DIAGNOSIS — H52223 Regular astigmatism, bilateral: Secondary | ICD-10-CM | POA: Diagnosis not present

## 2016-04-21 DIAGNOSIS — H524 Presbyopia: Secondary | ICD-10-CM | POA: Diagnosis not present

## 2016-04-21 DIAGNOSIS — H25813 Combined forms of age-related cataract, bilateral: Secondary | ICD-10-CM | POA: Diagnosis not present

## 2016-05-11 ENCOUNTER — Ambulatory Visit (INDEPENDENT_AMBULATORY_CARE_PROVIDER_SITE_OTHER): Payer: Medicare Other | Admitting: Family Medicine

## 2016-05-11 ENCOUNTER — Encounter: Payer: Self-pay | Admitting: Family Medicine

## 2016-05-11 ENCOUNTER — Other Ambulatory Visit: Payer: Self-pay | Admitting: Family Medicine

## 2016-05-11 VITALS — BP 152/90 | Temp 97.6°F | Ht 70.0 in | Wt 154.0 lb

## 2016-05-11 DIAGNOSIS — J069 Acute upper respiratory infection, unspecified: Secondary | ICD-10-CM

## 2016-05-11 DIAGNOSIS — R350 Frequency of micturition: Secondary | ICD-10-CM

## 2016-05-11 DIAGNOSIS — D508 Other iron deficiency anemias: Secondary | ICD-10-CM

## 2016-05-11 DIAGNOSIS — B9789 Other viral agents as the cause of diseases classified elsewhere: Secondary | ICD-10-CM | POA: Diagnosis not present

## 2016-05-11 DIAGNOSIS — F05 Delirium due to known physiological condition: Secondary | ICD-10-CM | POA: Diagnosis not present

## 2016-05-11 DIAGNOSIS — I10 Essential (primary) hypertension: Secondary | ICD-10-CM

## 2016-05-11 LAB — POCT URINALYSIS DIPSTICK
Bilirubin, UA: NEGATIVE
Blood, UA: NEGATIVE
GLUCOSE UA: NEGATIVE
Ketones, UA: NEGATIVE
LEUKOCYTES UA: NEGATIVE
NITRITE UA: NEGATIVE
Spec Grav, UA: 1.01
UROBILINOGEN UA: 0.2
pH, UA: 6

## 2016-05-11 MED ORDER — AMLODIPINE BESYLATE 2.5 MG PO TABS: 2.5000 mg | ORAL_TABLET | Freq: Every day | ORAL | 3 refills | Status: DC

## 2016-05-11 NOTE — Progress Notes (Signed)
   Subjective:    Patient ID: Ethan Faulkner, male    DOB: 08/24/1915, 82 y.o.   MRN: [id number]  HPI  Patient in office today c/o sore throat for 2 weeks.  Accompanied by Daughters, [name] and [place], they express concern about patients recent confusion, hallucinations, and fatigue.  They states he is having forgetfullness. Apparently having sometimes of forgetfulness but in addition to this a couple times he thought he saw something that afterwards it was apparent he did not see it. Patient denies any wheezing difficulty breathing they're worried about the possibility of urinary tract infection  Review of Systems  Constitutional: Positive for fatigue. Negative for chills and fever.  HENT: Positive for rhinorrhea. Negative for congestion and sore throat.   Respiratory: Negative for cough and shortness of breath.   Cardiovascular: Negative for chest pain.  Gastrointestinal: Negative for abdominal pain.  Musculoskeletal: Negative for back pain.  Neurological: Negative for dizziness, light-headedness and headaches.  Psychiatric/Behavioral: Positive for confusion.       Objective:   Physical Exam  Constitutional: He appears well-developed.  HENT:  Head: Normocephalic.  Mouth/Throat: Oropharynx is clear and moist. No oropharyngeal exudate.  Neck: Normal range of motion.  Cardiovascular: Normal rate, regular rhythm and normal heart sounds.   No murmur heard. Pulmonary/Chest: Effort normal and breath sounds normal. He has no wheezes.  Lymphadenopathy:    He has no cervical adenopathy.  Neurological: He exhibits normal muscle tone.  Skin: Skin is warm and dry.  Nursing note and vitals reviewed. Significant amount of time spent today discussing with the daughters as well as the patient what is going on with him. I do not find any evidence of any acute severe issues. I would not recommend any MRIs or extensive lab work. If his condition gets worse he'll follow-up greater in half the time  was spent with this patient discussing these issues 25 minutes. ua negative  FS Hg 14.5    Assessment & Plan:  Hemoglobin was checked because he is on a blood thinner it's normal Urinalysis did not show any infection Patient has occasional hallucination could be related to his age he is very with it today. There is no need to do any type of MRI currently. Patient to follow-up in 3-4 months sooner if any problems

## 2016-05-21 ENCOUNTER — Emergency Department (HOSPITAL_COMMUNITY): Payer: Medicare Other

## 2016-05-21 ENCOUNTER — Inpatient Hospital Stay (HOSPITAL_COMMUNITY)
Admission: EM | Admit: 2016-05-21 | Discharge: 2016-05-25 | DRG: 065 | Disposition: A | Discharge status: Home Health | Payer: Medicare Other | Attending: Internal Medicine | Admitting: Internal Medicine

## 2016-05-21 ENCOUNTER — Encounter (HOSPITAL_COMMUNITY): Payer: Self-pay

## 2016-05-21 DIAGNOSIS — R410 Disorientation, unspecified: Secondary | ICD-10-CM | POA: Diagnosis not present

## 2016-05-21 DIAGNOSIS — Z87891 Personal history of nicotine dependence: Secondary | ICD-10-CM | POA: Diagnosis not present

## 2016-05-21 DIAGNOSIS — I639 Cerebral infarction, unspecified: Secondary | ICD-10-CM | POA: Diagnosis not present

## 2016-05-21 DIAGNOSIS — R29818 Other symptoms and signs involving the nervous system: Secondary | ICD-10-CM | POA: Diagnosis not present

## 2016-05-21 DIAGNOSIS — I6789 Other cerebrovascular disease: Secondary | ICD-10-CM | POA: Diagnosis not present

## 2016-05-21 DIAGNOSIS — E039 Hypothyroidism, unspecified: Secondary | ICD-10-CM | POA: Diagnosis present

## 2016-05-21 DIAGNOSIS — R531 Weakness: Secondary | ICD-10-CM | POA: Diagnosis not present

## 2016-05-21 DIAGNOSIS — R4781 Slurred speech: Secondary | ICD-10-CM | POA: Diagnosis not present

## 2016-05-21 DIAGNOSIS — R29898 Other symptoms and signs involving the musculoskeletal system: Secondary | ICD-10-CM | POA: Diagnosis not present

## 2016-05-21 DIAGNOSIS — G459 Transient cerebral ischemic attack, unspecified: Secondary | ICD-10-CM | POA: Diagnosis present

## 2016-05-21 DIAGNOSIS — Z7901 Long term (current) use of anticoagulants: Secondary | ICD-10-CM

## 2016-05-21 DIAGNOSIS — I10 Essential (primary) hypertension: Secondary | ICD-10-CM | POA: Diagnosis present

## 2016-05-21 DIAGNOSIS — I63233 Cerebral infarction due to unspecified occlusion or stenosis of bilateral carotid arteries: Secondary | ICD-10-CM | POA: Diagnosis not present

## 2016-05-21 DIAGNOSIS — Z8546 Personal history of malignant neoplasm of prostate: Secondary | ICD-10-CM

## 2016-05-21 DIAGNOSIS — M6281 Muscle weakness (generalized): Secondary | ICD-10-CM | POA: Diagnosis not present

## 2016-05-21 DIAGNOSIS — I482 Chronic atrial fibrillation: Secondary | ICD-10-CM | POA: Diagnosis present

## 2016-05-21 DIAGNOSIS — G8194 Hemiplegia, unspecified affecting left nondominant side: Secondary | ICD-10-CM | POA: Diagnosis present

## 2016-05-21 DIAGNOSIS — D696 Thrombocytopenia, unspecified: Secondary | ICD-10-CM | POA: Diagnosis not present

## 2016-05-21 DIAGNOSIS — D72819 Decreased white blood cell count, unspecified: Secondary | ICD-10-CM

## 2016-05-21 DIAGNOSIS — Z923 Personal history of irradiation: Secondary | ICD-10-CM

## 2016-05-21 DIAGNOSIS — I4891 Unspecified atrial fibrillation: Secondary | ICD-10-CM | POA: Diagnosis present

## 2016-05-21 DIAGNOSIS — Z66 Do not resuscitate: Secondary | ICD-10-CM | POA: Diagnosis present

## 2016-05-21 DIAGNOSIS — Z96642 Presence of left artificial hip joint: Secondary | ICD-10-CM | POA: Diagnosis present

## 2016-05-21 DIAGNOSIS — Z823 Family history of stroke: Secondary | ICD-10-CM

## 2016-05-21 DIAGNOSIS — R262 Difficulty in walking, not elsewhere classified: Secondary | ICD-10-CM

## 2016-05-21 DIAGNOSIS — R1312 Dysphagia, oropharyngeal phase: Secondary | ICD-10-CM

## 2016-05-21 LAB — URINALYSIS, ROUTINE W REFLEX MICROSCOPIC
BILIRUBIN URINE: NEGATIVE
Bacteria, UA: NONE SEEN
Glucose, UA: NEGATIVE mg/dL
Ketones, ur: NEGATIVE mg/dL
Leukocytes, UA: NEGATIVE
Nitrite: NEGATIVE
Protein, ur: NEGATIVE mg/dL
SPECIFIC GRAVITY, URINE: 1.033 — AB (ref 1.005–1.030)
pH: 6 (ref 5.0–8.0)

## 2016-05-21 LAB — I-STAT CHEM 8, ED
BUN: 19 mg/dL (ref 6–20)
CALCIUM ION: 1.22 mmol/L (ref 1.15–1.40)
Chloride: 106 mmol/L (ref 101–111)
Creatinine, Ser: 1 mg/dL (ref 0.61–1.24)
GLUCOSE: 105 mg/dL — AB (ref 65–99)
HCT: 41 % (ref 39.0–52.0)
HEMOGLOBIN: 13.9 g/dL (ref 13.0–17.0)
POTASSIUM: 3.8 mmol/L (ref 3.5–5.1)
SODIUM: 136 mmol/L (ref 135–145)
TCO2: 28 mmol/L (ref 0–100)

## 2016-05-21 LAB — PROTIME-INR
INR: 1.19
PROTHROMBIN TIME: 15.1 s (ref 11.4–15.2)

## 2016-05-21 LAB — COMPREHENSIVE METABOLIC PANEL
ALT: 11 U/L — ABNORMAL LOW (ref 17–63)
ANION GAP: 6 (ref 5–15)
AST: 16 U/L (ref 15–41)
Albumin: 3.8 g/dL (ref 3.5–5.0)
Alkaline Phosphatase: 58 U/L (ref 38–126)
BUN: 19 mg/dL (ref 6–20)
CHLORIDE: 102 mmol/L (ref 101–111)
CO2: 27 mmol/L (ref 22–32)
Calcium: 9 mg/dL (ref 8.9–10.3)
Creatinine, Ser: 1.04 mg/dL (ref 0.61–1.24)
GFR, EST NON AFRICAN AMERICAN: 58 mL/min — AB (ref >60)
Glucose, Bld: 111 mg/dL — ABNORMAL HIGH (ref 65–99)
POTASSIUM: 3.8 mmol/L (ref 3.5–5.1)
Sodium: 135 mmol/L (ref 135–145)
Total Bilirubin: 1.3 mg/dL — ABNORMAL HIGH (ref 0.3–1.2)
Total Protein: 6.7 g/dL (ref 6.5–8.1)

## 2016-05-21 LAB — TSH: TSH: 2.57 u[IU]/mL (ref 0.350–4.500)

## 2016-05-21 LAB — CBC
HEMATOCRIT: 39.6 % (ref 39.0–52.0)
Hemoglobin: 13.2 g/dL (ref 13.0–17.0)
MCH: 31.1 pg (ref 26.0–34.0)
MCHC: 33.3 g/dL (ref 30.0–36.0)
MCV: 93.4 fL (ref 78.0–100.0)
Platelets: 136 10*3/uL — ABNORMAL LOW (ref 150–400)
RBC: 4.24 MIL/uL (ref 4.22–5.81)
RDW: 14.5 % (ref 11.5–15.5)
WBC: 3.8 10*3/uL — ABNORMAL LOW (ref 4.0–10.5)

## 2016-05-21 LAB — DIFFERENTIAL
BASOS PCT: 1 %
Basophils Absolute: 0 10*3/uL (ref 0.0–0.1)
EOS ABS: 0.1 10*3/uL (ref 0.0–0.7)
Eosinophils Relative: 2 %
Lymphocytes Relative: 26 %
Lymphs Abs: 1 10*3/uL (ref 0.7–4.0)
MONO ABS: 0.3 10*3/uL (ref 0.1–1.0)
MONOS PCT: 8 %
Neutro Abs: 2.4 10*3/uL (ref 1.7–7.7)
Neutrophils Relative %: 63 %

## 2016-05-21 LAB — RAPID URINE DRUG SCREEN, HOSP PERFORMED
Amphetamines: NOT DETECTED
BENZODIAZEPINES: NOT DETECTED
Barbiturates: NOT DETECTED
COCAINE: NOT DETECTED
Opiates: NOT DETECTED
Tetrahydrocannabinol: NOT DETECTED

## 2016-05-21 LAB — ETHANOL

## 2016-05-21 LAB — I-STAT TROPONIN, ED: TROPONIN I, POC: 0 ng/mL (ref 0.00–0.08)

## 2016-05-21 LAB — APTT: APTT: 42 s — AB (ref 24–36)

## 2016-05-21 MED ORDER — IOPAMIDOL (ISOVUE-370) INJECTION 76%
75.0000 mL | Freq: Once | INTRAVENOUS | Status: AC | PRN
  Administered 2016-05-21: 74 mL via INTRAVENOUS

## 2016-05-21 MED ORDER — POLYETHYLENE GLYCOL 3350 17 G PO PACK: 17.0000 g | PACK | Freq: Every day | ORAL | Status: DC | PRN

## 2016-05-21 MED ORDER — LEVOTHYROXINE SODIUM 50 MCG PO TABS
25.0000 ug | ORAL_TABLET | Freq: Every day | ORAL | Status: DC
  Administered 2016-05-22 – 2016-05-25 (×4): 25 ug via ORAL
  Filled 2016-05-21 (×4): qty 1

## 2016-05-21 MED ORDER — ACETAMINOPHEN 160 MG/5ML PO SOLN: 650.0000 mg | ORAL | Status: DC | PRN

## 2016-05-21 MED ORDER — SODIUM CHLORIDE 0.9 % IV SOLN
INTRAVENOUS | Status: AC
  Administered 2016-05-21: 23:00:00 via INTRAVENOUS

## 2016-05-21 MED ORDER — AMLODIPINE BESYLATE 5 MG PO TABS
2.5000 mg | ORAL_TABLET | Freq: Every day | ORAL | Status: DC
  Administered 2016-05-22: 2.5 mg via ORAL
  Filled 2016-05-21: qty 1

## 2016-05-21 MED ORDER — METOPROLOL SUCCINATE ER 25 MG PO TB24
25.0000 mg | ORAL_TABLET | Freq: Every evening | ORAL | Status: DC
  Administered 2016-05-21 – 2016-05-24 (×4): 25 mg via ORAL
  Filled 2016-05-21 (×4): qty 1

## 2016-05-21 MED ORDER — APIXABAN 5 MG PO TABS
5.0000 mg | ORAL_TABLET | Freq: Two times a day (BID) | ORAL | Status: DC
  Administered 2016-05-21 – 2016-05-25 (×8): 5 mg via ORAL
  Filled 2016-05-21 (×8): qty 1

## 2016-05-21 MED ORDER — ACETAMINOPHEN 650 MG RE SUPP: 650.0000 mg | RECTAL | Status: DC | PRN

## 2016-05-21 MED ORDER — LORATADINE 10 MG PO TABS
10.0000 mg | ORAL_TABLET | Freq: Every day | ORAL | Status: DC
Start: 2016-05-22 — End: 2016-05-25
  Administered 2016-05-22 – 2016-05-25 (×4): 10 mg via ORAL
  Filled 2016-05-21 (×4): qty 1

## 2016-05-21 MED ORDER — ACETAMINOPHEN 325 MG PO TABS: 650.0000 mg | ORAL_TABLET | ORAL | Status: DC | PRN

## 2016-05-21 MED ORDER — ASPIRIN EC 81 MG PO TBEC: 81.0000 mg | DELAYED_RELEASE_TABLET | Freq: Every day | ORAL | 0 refills | Status: DC

## 2016-05-21 MED ORDER — STROKE: EARLY STAGES OF RECOVERY BOOK
Freq: Once | Status: AC
  Administered 2016-05-22: 12:00:00
  Filled 2016-05-21: qty 1

## 2016-05-21 MED ORDER — ACETAMINOPHEN 325 MG PO TABS: 325.0000 mg | ORAL_TABLET | Freq: Four times a day (QID) | ORAL | Status: DC | PRN

## 2016-05-21 NOTE — ED Notes (Signed)
Pt stating he needed to use the restroom but was unable at this time.

## 2016-05-21 NOTE — ED Notes (Signed)
cbg 178 per ems  

## 2016-05-21 NOTE — H&P (Signed)
History and Physical    Ethan Faulkner [id number] DOB: [birth year] DOA: 05/21/2016  PCP: Sallee Lange, MD   Patient coming from: Home  Chief Complaint: Left-sided weakness, confusion   HPI: Ethan Faulkner is a 82 y.o. male with medical history significant for hypertension, hypothyroidism, chronic atrial fibrillation on a look was, and chronic leukopenia and thrombocytopenia who presents to the emergency department with weakness involving the left arm, left leg, and left face, with mild confusion. Patient reportedly had trouble sleeping last night, but had otherwise been in his usual state of health. He lives alone, but had family members with him this afternoon when he was noted at approximately 2:15 PM to have a left facial droop and weakness involving the left arm and leg. He was speaking fluently, but seemed to be a little bit confused. He had trouble ambulating due to his apparent left leg weakness. Symptoms persisted and EMS was activated this evening. En route to the hospital, EMS reports improvement in his weakness. There has been no recent illness reported, no fevers or chills, and no vomiting or diarrhea. There has been no recent fall or head trauma. No recent change in medications. Patient does not use illicit substances or alcohol.  ED Course: Upon arrival to the ED, patient is found to be afebrile, saturating well on room air, and with vitals otherwise stable. EKG features in atrial fibrillation with QTc 503 ms. Noncontrast head CT is negative for acute intracranial abnormality. Chemistry panel was unremarkable and CBC is notable for chronic and stable leukopenia and thrombocytopenia. INR is within normal limits and troponin is undetectable. Patient was noted to have some weakness in the left leg and only slight left facial droop in the emergency department. Teleneurologist evaluated the patient, requested that CTA of the head and neck be performed, and if this study is negative for  acute large vessel occlusion, the patient can be observed [facility] for ongoing evaluation and management of suspected CVA/TIA. CTA head and neck was negative for large vessel occlusion, notable only for mild atherosclerotic stenosis in the bilateral cavernous carotid. Patient remained hemodynamically stable in the ED, passed his bedside swallow evaluation, and will be observed on the telemetry unit for ongoing evaluation and management of suspected CVA/TIA.  Review of Systems:  All other systems reviewed and apart from HPI, are negative.  Past Medical History:  Diagnosis Date  . Afib ([place])   . Hypertension   . Hypothyroidism   . Migraine    "maybe monthly" (11/21/2015)  . Prostate cancer ([place]) ~ 1995   S/P radiation tx  . Renal mass    "slow growing something; doctors just watching it right now" (11/21/2015)  . Thyroid disease    hypothyroid    Past Surgical History:  Procedure Laterality Date  . COLONOSCOPY  1999  . ESOPHAGOGASTRODUODENOSCOPY    . LAPAROSCOPIC CHOLECYSTECTOMY  1980s  . PROSTATE BIOPSY  ~ 1995  . TOTAL HIP ARTHROPLASTY Left 11/22/2015   Procedure: LEFT HIP HEMIARTHROPLASTY ;  Surgeon: Leandrew Koyanagi, MD;  Location: [facility];  Service: Orthopedics;  Laterality: Left;     reports that he has quit smoking. He has never used smokeless tobacco. He reports that he drinks alcohol. He reports that he does not use drugs.  Allergies  Allergen Reactions  . Xanax [Alprazolam] Other (See Comments)    Dizzy, Sedated  . Tape Rash    PATIENT'S SKIN WILL TEAR/PLEASE EITHER USE PAPER TAPE OR COBAN WRAP!!  Family History  Problem Relation Age of Onset  . Stroke Sister     in her 66s  . CAD Neg Hx   . COPD Neg Hx   . Diabetes Neg Hx   . Heart disease Neg Hx      Prior to Admission medications   Medication Sig Start Date End Date Taking? Authorizing Provider  acetaminophen (TYLENOL) 325 MG tablet Take 325-650 mg by mouth every 6 (six) hours as needed for mild  pain or moderate pain.   Yes Historical Provider, MD  amLODipine (NORVASC) 2.5 MG tablet Take 1 tablet (2.5 mg total) by mouth daily. 05/11/16  Yes Kathyrn Drown, MD  apixaban (ELIQUIS) 5 MG TABS tablet TAKE (1) TABLET BY MOUTH TWICE DAILY. 01/13/16  Yes Kathyrn Drown, MD  cetirizine (ZYRTEC) 10 MG tablet Take 10 mg by mouth daily.   Yes Historical Provider, MD  levothyroxine (SYNTHROID, LEVOTHROID) 25 MCG tablet TAKE (1) TABLET BY MOUTH EACH MORNING. 01/13/16  Yes Kathyrn Drown, MD  metoprolol succinate (TOPROL-XL) 25 MG 24 hr tablet Take 1 tablet (25 mg total) by mouth daily. Patient taking differently: Take 25 mg by mouth every evening.  01/13/16  Yes Scott A Luking, MD  polyethylene glycol powder (GLYCOLAX/MIRALAX) powder MIX 1 CAPFUL IN 8 OZ OF WATER AND DRINK DAILY. Patient taking differently: MIX 1 CAPFUL IN 8 OZ OF WATER AND DRINK DAILY AS NEEDED FOR CONSTIPATION 05/11/16  Yes Kathyrn Drown, MD  alendronate (FOSAMAX) 70 MG tablet Take 1 tablet (70 mg total) by mouth every 7 (seven) days. Take with a full glass of water on an empty stomach. Patient not taking: Reported on 05/21/2016 03/09/16   Kathyrn Drown, MD    Physical Exam: Vitals:   05/21/16 1951 05/21/16 2000 05/21/16 2015 05/21/16 2030  BP: 140/67 153/72  166/82  Pulse: 72 75 72 74  Resp: 20 19 26 19   Temp:      TempSrc:      SpO2: 96% 96% 96% 98%      Constitutional: NAD, calm, comfortable Eyes: PERTLA, lids and conjunctivae normal ENMT: Mucous membranes are dry. Posterior pharynx clear of any exudate or lesions.   Neck: normal, supple, no masses, no thyromegaly Respiratory: clear to auscultation bilaterally, no wheezing, no crackles. Normal respiratory effort. No accessory muscle use.  Cardiovascular: Rate ~80 and irregular. No extremity edema. No carotid bruits. No significant JVD. Abdomen: No distension, no tenderness, no masses palpated. Bowel sounds normal.  Musculoskeletal: no clubbing / cyanosis. No joint  deformity upper and lower extremities. Normal muscle tone.  Skin: no significant rashes, lesions, ulcers. Warm, dry, well-perfused. Neurologic: CN 2-12 grossly intact. Sensation to light touch intact in distal extremities x4. Strength 5/5 in all 4 limbs, except for 3-4/5 in proximal LLE with distal LLE strength 5/5.  Psychiatric: Normal judgment and insight. Alert and oriented x 3.       Labs on Admission: I have personally reviewed following labs and imaging studies  CBC:  Recent Labs Lab 05/21/16 1726 05/21/16 1751  WBC 3.8*  --   NEUTROABS 2.4  --   HGB 13.2 13.9  HCT 39.6 41.0  MCV 93.4  --   PLT 136*  --    Basic Metabolic Panel:  Recent Labs Lab 05/21/16 1726 05/21/16 1751  NA 135 136  K 3.8 3.8  CL 102 106  CO2 27  --   GLUCOSE 111* 105*  BUN 19 19  CREATININE 1.04 1.00  CALCIUM  9.0  --    GFR: Estimated Creatinine Clearance: 40.8 mL/min (by C-G formula based on SCr of 1 mg/dL). Liver Function Tests:  Recent Labs Lab 05/21/16 1726  AST 16  ALT 11*  ALKPHOS 58  BILITOT 1.3*  PROT 6.7  ALBUMIN 3.8   No results for input(s): LIPASE, AMYLASE in the last 168 hours. No results for input(s): AMMONIA in the last 168 hours. Coagulation Profile:  Recent Labs Lab 05/21/16 1726  INR 1.19   Cardiac Enzymes: No results for input(s): CKTOTAL, CKMB, CKMBINDEX, TROPONINI in the last 168 hours. BNP (last 3 results) No results for input(s): PROBNP in the last 8760 hours. HbA1C: No results for input(s): HGBA1C in the last 72 hours. CBG: No results for input(s): GLUCAP in the last 168 hours. Lipid Profile: No results for input(s): CHOL, HDL, LDLCALC, TRIG, CHOLHDL, LDLDIRECT in the last 72 hours. Thyroid Function Tests: No results for input(s): TSH, T4TOTAL, FREET4, T3FREE, THYROIDAB in the last 72 hours. Anemia Panel: No results for input(s): VITAMINB12, FOLATE, FERRITIN, TIBC, IRON, RETICCTPCT in the last 72 hours. Urine analysis:    Component Value  Date/Time   COLORURINE YELLOW 11/22/2015 0248   APPEARANCEUR CLEAR 11/22/2015 0248   LABSPEC 1.020 11/22/2015 0248   PHURINE 5.5 11/22/2015 0248   GLUCOSEU NEGATIVE 11/22/2015 0248   HGBUR NEGATIVE 11/22/2015 0248   BILIRUBINUR Neg 05/11/2016 1117   KETONESUR NEGATIVE 11/22/2015 0248   PROTEINUR Ne 05/11/2016 1117   PROTEINUR NEGATIVE 11/22/2015 0248   UROBILINOGEN 0.2 05/11/2016 1117   NITRITE Neg 05/11/2016 1117   NITRITE NEGATIVE 11/22/2015 0248   LEUKOCYTESUR Negative 05/11/2016 1117   Sepsis Labs: @LABRCNTIP (procalcitonin:4,lacticidven:4) )No results found for this or any previous visit (from the past 240 hour(s)).   Radiological Exams on Admission: Ct Angio Head W Or Wo Contrast  Result Date: 05/21/2016 CLINICAL DATA:  Stroke.  New left-sided weakness. EXAM: CT ANGIOGRAPHY HEAD AND NECK TECHNIQUE: Multidetector CT imaging of the head and neck was performed using the standard protocol during bolus administration of intravenous contrast. Multiplanar CT image reconstructions and MIPs were obtained to evaluate the vascular anatomy. Carotid stenosis measurements (when applicable) are obtained utilizing NASCET criteria, using the distal internal carotid diameter as the denominator. CONTRAST:  75 mL Isovue 370 IV COMPARISON:  CT head 05/21/2016 FINDINGS: CTA NECK FINDINGS Aortic arch: Atherosclerotic calcification in the aortic arch. Bovine branching pattern. Atherosclerotic calcification proximal great vessels without significant stenosis. There is extensive venous contrast on the study due to late timing. In addition, there is reflux of dense contrast up the left jugular vein from a left arm injection due to left innominate vein stenosis. Right carotid system: Right common carotid artery widely patent with mild atherosclerotic calcification. Atherosclerotic calcification right carotid bifurcation. No significant internal carotid artery stenosis. Mild stenosis external carotid artery due to  calcific plaque. Left carotid system: Left common carotid artery widely patent. Atherosclerotic calcification left carotid bifurcation without significant carotid stenosis. Vertebral arteries: Both vertebral arteries are patent to the basilar without significant stenosis. Skeleton: Mild degenerative changes in the cervical spine. No acute skeletal abnormality. Other neck: Negative for mass or adenopathy Upper chest: Bilateral pleural effusions right greater than left. Lung apices clear. Review of the MIP images confirms the above findings CTA HEAD FINDINGS Anterior circulation: Extensive atherosclerotic calcification in the cavernous carotid bilaterally causing mild stenosis. Anterior middle cerebral arteries patent bilaterally. Mild atherosclerotic irregularity in the middle cerebral artery branches bilaterally. Posterior circulation: Both vertebral arteries patent to the basilar with  ectasia due to atherosclerotic disease. The basilar is tortuous and ectatic. No significant basilar stenosis. PICA patent bilaterally. Superior cerebellar and posterior cerebral arteries patent bilaterally without stenosis. Venous sinuses: Patent Anatomic variants: None Delayed phase: Normal enhancement on delayed imaging. No enhancing mass lesion. Generalized atrophy with chronic microvascular ischemia. Review of the MIP images confirms the above findings IMPRESSION: Atherosclerotic calcification in the carotid bifurcation bilaterally without significant carotid stenosis in the neck. Atherosclerotic calcification and mild stenosis in the cavernous carotid bilaterally. No significant vertebral artery stenosis. Atherosclerotic irregularity in middle cerebral artery branches bilaterally without significant intracranial stenosis. Diffusely ectatic and tortuous intracranial circulation likely due to chronic hypertension. Bilateral pleural effusions right greater than left. Electronically Signed   By: Franchot Gallo M.D.   On: 05/21/2016  19:06   Ct Head Wo Contrast  Result Date: 05/21/2016 CLINICAL DATA:  New left-sided weakness, code stroke EXAM: CT HEAD WITHOUT CONTRAST TECHNIQUE: Contiguous axial images were obtained from the base of the skull through the vertex without intravenous contrast. COMPARISON:  11/20/2015 FINDINGS: Brain: No intracranial hemorrhage, mass effect or midline shift. No definite acute cortical infarction. Stable atrophy and chronic small vessel ischemic changes. No mass lesion is noted on this unenhanced scan. Ventricular size is stable from prior exam. Vascular: Atherosclerotic calcifications of carotid siphon again noted. Atherosclerotic calcifications of bilateral vertebral arteries. Skull: No skull fracture is noted. Sinuses/Orbits: No acute findings. Other: None IMPRESSION: No acute intracranial abnormality. Stable atrophy and chronic white matter disease. No definite acute cortical infarction. These results were called by telephone at the time of interpretation on 05/21/2016 at 5:40 pm to Dr. Orlie Dakin , who verbally acknowledged these results. Electronically Signed   By: Lahoma Crocker M.D.   On: 05/21/2016 17:40   Ct Angio Neck W And/or Wo Contrast  Result Date: 05/21/2016 CLINICAL DATA:  Stroke.  New left-sided weakness. EXAM: CT ANGIOGRAPHY HEAD AND NECK TECHNIQUE: Multidetector CT imaging of the head and neck was performed using the standard protocol during bolus administration of intravenous contrast. Multiplanar CT image reconstructions and MIPs were obtained to evaluate the vascular anatomy. Carotid stenosis measurements (when applicable) are obtained utilizing NASCET criteria, using the distal internal carotid diameter as the denominator. CONTRAST:  75 mL Isovue 370 IV COMPARISON:  CT head 05/21/2016 FINDINGS: CTA NECK FINDINGS Aortic arch: Atherosclerotic calcification in the aortic arch. Bovine branching pattern. Atherosclerotic calcification proximal great vessels without significant stenosis.  There is extensive venous contrast on the study due to late timing. In addition, there is reflux of dense contrast up the left jugular vein from a left arm injection due to left innominate vein stenosis. Right carotid system: Right common carotid artery widely patent with mild atherosclerotic calcification. Atherosclerotic calcification right carotid bifurcation. No significant internal carotid artery stenosis. Mild stenosis external carotid artery due to calcific plaque. Left carotid system: Left common carotid artery widely patent. Atherosclerotic calcification left carotid bifurcation without significant carotid stenosis. Vertebral arteries: Both vertebral arteries are patent to the basilar without significant stenosis. Skeleton: Mild degenerative changes in the cervical spine. No acute skeletal abnormality. Other neck: Negative for mass or adenopathy Upper chest: Bilateral pleural effusions right greater than left. Lung apices clear. Review of the MIP images confirms the above findings CTA HEAD FINDINGS Anterior circulation: Extensive atherosclerotic calcification in the cavernous carotid bilaterally causing mild stenosis. Anterior middle cerebral arteries patent bilaterally. Mild atherosclerotic irregularity in the middle cerebral artery branches bilaterally. Posterior circulation: Both vertebral arteries patent to the basilar with ectasia  due to atherosclerotic disease. The basilar is tortuous and ectatic. No significant basilar stenosis. PICA patent bilaterally. Superior cerebellar and posterior cerebral arteries patent bilaterally without stenosis. Venous sinuses: Patent Anatomic variants: None Delayed phase: Normal enhancement on delayed imaging. No enhancing mass lesion. Generalized atrophy with chronic microvascular ischemia. Review of the MIP images confirms the above findings IMPRESSION: Atherosclerotic calcification in the carotid bifurcation bilaterally without significant carotid stenosis in the  neck. Atherosclerotic calcification and mild stenosis in the cavernous carotid bilaterally. No significant vertebral artery stenosis. Atherosclerotic irregularity in middle cerebral artery branches bilaterally without significant intracranial stenosis. Diffusely ectatic and tortuous intracranial circulation likely due to chronic hypertension. Bilateral pleural effusions right greater than left. Electronically Signed   By: Franchot Gallo M.D.   On: 05/21/2016 19:06    EKG: Independently reviewed. Atrial fibrillation.   Assessment/Plan  1. Left-sided weakness, confusion  - Pt noted by family to have left facial droop, left arm and leg weakness, and mild confusion starting the afternoon of admission; sxs improving en route with EMS and nearly resolved by time of admission - CT head neg for acute finding; teleneurology advised CTA head and neck (negative for significant stenosis)  - No tPA given rapidly resolving sxs  - Swallow screen passed in ED, diet ordered  - Family and pt refuse full CVA evaluation, agreeing only to MRI brain and PT/OT evals (these have been ordered)    2. Atrial fibrillation  - Pt with chronic a fib; CHADS-VASc 3 (age x2, HTN)  - Continue AC with Eliquis, rate-control with metoprolol   3. Hypothyroidism  - Check TSH given the presentation  - Continue current-dose Synthroid for now    4. Leukopenia, thrombocytopenia  - WBC is 3,600 on admission and platelet count 136,000  - There is no s/s of bleeding or infection and both indices are stable   5. Hypertension  - BP at goal currently - Plan to continue Norvasc and metoprolol as tolerated    DVT prophylaxis: Eliquis  Code Status: Full  Family Communication: Family updated at bedside Disposition Plan: Observe on telemetry Consults called: Neurology Admission status: Observation    Vianne Bulls, MD [facility] Pager [phone]  If 7PM-7AM, please contact night-coverage [email or web address] Password  [email or web address]  05/21/2016, 9:22 PM

## 2016-05-21 NOTE — Progress Notes (Signed)
ER called [id number], ambulance backing into spot, CODE STROKE/  1718 beeper 1720 started exam--LAB in room trying to get labs 1727 finished exam 1927 called GRA and sent images to [facility] and PACS and [facility]

## 2016-05-21 NOTE — ED Notes (Signed)
Pt gone to CT for more pictures

## 2016-05-21 NOTE — ED Notes (Signed)
Family has fed the pt a hamburger and a milkshake.

## 2016-05-21 NOTE — ED Triage Notes (Signed)
Per ems a family member reports pt slept most of the day today because he didn't sleep well last night.  Reports he woke up at 2pm and helped pt into another room in the house to eat.  At approx 2:15 pt started having left sided facial droop and left side of body became weak.  Another family member arrived around 5pm and called ems.

## 2016-05-21 NOTE — ED Provider Notes (Signed)
[facility] DEPT Provider Note   CSN: [id number] Arrival date & time: 05/21/16  1724   An emergency department physician performed an initial assessment on this suspected stroke patient at 1725. Level V caveat acuity of situation code stroke as she is obtained from EMS History   Chief Complaint Chief Complaint  Patient presents with  . Code Stroke    HPI Ethan Faulkner is a 82 y.o. male. patient developed sudden onset left arm and left leg weakness and left mouth droop 2 PM today. His left arm strength has markedly improved since in EMS presence. Patient denies pain anywhere. No treatment prior to coming here. Code stroke called in the field. Patient denies noncompliance with medications. HPI  Past Medical History:  Diagnosis Date  . Afib ([place])   . Hypertension   . Hypothyroidism   . Migraine    "maybe monthly" (11/21/2015)  . Prostate cancer ([place]) ~ 1995   S/P radiation tx  . Renal mass    "slow growing something; doctors just watching it right now" (11/21/2015)  . Thyroid disease    hypothyroid    Patient Active Problem List   Diagnosis Date Noted  . Fall   . Acute blood loss anemia   . Femoral neck fracture ([place]) 11/20/2015  . Renal malignant tumor ([place]) 06/06/2015  . Long-term (current) use of anticoagulants 04/29/2015  . Subclinical hypothyroidism 03/01/2014  . Atrial fibrillation ([place]) 06/09/2013  . STRESS FRACTURE, FOOT 12/12/2008  . CLOSED FRACTURE OF METATARSAL BONE 12/12/2008    Past Surgical History:  Procedure Laterality Date  . COLONOSCOPY  1999  . ESOPHAGOGASTRODUODENOSCOPY    . LAPAROSCOPIC CHOLECYSTECTOMY  1980s  . PROSTATE BIOPSY  ~ 1995  . TOTAL HIP ARTHROPLASTY Left 11/22/2015   Procedure: LEFT HIP HEMIARTHROPLASTY ;  Surgeon: Leandrew Koyanagi, MD;  Location: [facility];  Service: Orthopedics;  Laterality: Left;       Home Medications    Prior to Admission medications   Medication Sig Start Date End Date Taking? Authorizing Provider    amLODipine (NORVASC) 2.5 MG tablet Take 1 tablet (2.5 mg total) by mouth daily. 05/11/16  Yes Kathyrn Drown, MD  apixaban (ELIQUIS) 5 MG TABS tablet TAKE (1) TABLET BY MOUTH TWICE DAILY. 01/13/16  Yes Kathyrn Drown, MD  levothyroxine (SYNTHROID, LEVOTHROID) 25 MCG tablet TAKE (1) TABLET BY MOUTH EACH MORNING. 01/13/16  Yes Kathyrn Drown, MD  metoprolol succinate (TOPROL-XL) 25 MG 24 hr tablet Take 1 tablet (25 mg total) by mouth daily. 01/13/16  Yes Scott A Luking, MD  polyethylene glycol powder (GLYCOLAX/MIRALAX) powder MIX 1 CAPFUL IN 8 OZ OF WATER AND DRINK DAILY. 05/11/16  Yes Kathyrn Drown, MD  acetaminophen (TYLENOL) 325 MG tablet Take 325-650 mg by mouth every 6 (six) hours as needed for mild pain or moderate pain.    Historical Provider, MD  alendronate (FOSAMAX) 70 MG tablet Take 1 tablet (70 mg total) by mouth every 7 (seven) days. Take with a full glass of water on an empty stomach. 03/09/16   Kathyrn Drown, MD  alum & mag hydroxide-simeth (MAALOX/MYLANTA) 200-200-20 MG/5ML suspension Take 30 mLs by mouth every 4 (four) hours as needed for indigestion. 11/25/15   Bonnell Public, MD  Calcium-Magnesium-Vitamin D (CALCIUM 500 PO) Take 1 tablet by mouth once a day    Historical Provider, MD  cetirizine (ZYRTEC) 10 MG tablet Take 10 mg by mouth daily.    Historical Provider, MD    Family History  Family History  Problem Relation Age of Onset  . Stroke Sister     in her 35s  . CAD Neg Hx   . COPD Neg Hx   . Diabetes Neg Hx   . Heart disease Neg Hx     Social History Social History  Substance Use Topics  . Smoking status: Former Research scientist (life sciences)  . Smokeless tobacco: Never Used     Comment: 11/21/2015 "might have smoked 2 packs of cigarettes in my lifetime"  . Alcohol use Yes     Comment: 11/21/2015 "last beer I've had was in ~ 2007"     Allergies   Xanax [alprazolam] and Tape   Review of Systems Review of Systems  Unable to perform ROS: Acuity of condition  Neurological:  Positive for weakness.     Physical Exam Updated Vital Signs BP 160/84 (BP Location: Left Arm)   Pulse 81   Temp 97.6 F (36.4 C)   Resp 20   SpO2 94%   Physical Exam  Constitutional: He is oriented to person, place, and time.  Chronically ill-appearing  HENT:  Head: Normocephalic and atraumatic.  Left mouth droop consistent, otherwise no facial asymmetry  Eyes: Conjunctivae and EOM are normal.  Neck: Neck supple. No tracheal deviation present. No thyromegaly present.  Cardiovascular: Normal rate and regular rhythm.   No murmur heard. Pulmonary/Chest: Effort normal and breath sounds normal.  Abdominal: Soft. Bowel sounds are normal. He exhibits no distension. There is no tenderness.  Musculoskeletal: Normal range of motion. He exhibits no edema or tenderness.  Neurological: He is alert and oriented to person, place, and time. Coordination normal.  Left upper extremity motor strength 5 over 5, left lower extremity motor strength 2/5. Right upper extremity motor strength 5 over 5 right lower extremity motor strength 5 over 5.  Skin: Skin is warm and dry. No rash noted.  Psychiatric: He has a normal mood and affect.  Nursing note and vitals reviewed.    ED Treatments / Results  Labs (all labs ordered are listed, but only abnormal results are displayed) Labs Reviewed  APTT - Abnormal; Notable for the following:       Result Value   aPTT 42 (*)    All other components within normal limits  CBC - Abnormal; Notable for the following:    WBC 3.8 (*)    Platelets 136 (*)    All other components within normal limits  COMPREHENSIVE METABOLIC PANEL - Abnormal; Notable for the following:    Glucose, Bld 111 (*)    ALT 11 (*)    Total Bilirubin 1.3 (*)    GFR calc non Af Amer 58 (*)    All other components within normal limits  I-STAT CHEM 8, ED - Abnormal; Notable for the following:    Glucose, Bld 105 (*)    All other components within normal limits  ETHANOL  PROTIME-INR    DIFFERENTIAL  RAPID URINE DRUG SCREEN, HOSP PERFORMED  URINALYSIS, ROUTINE W REFLEX MICROSCOPIC  I-STAT TROPOININ, ED    EKG  EKG Interpretation None       Radiology Ct Head Wo Contrast  Result Date: 05/21/2016 CLINICAL DATA:  New left-sided weakness, code stroke EXAM: CT HEAD WITHOUT CONTRAST TECHNIQUE: Contiguous axial images were obtained from the base of the skull through the vertex without intravenous contrast. COMPARISON:  11/20/2015 FINDINGS: Brain: No intracranial hemorrhage, mass effect or midline shift. No definite acute cortical infarction. Stable atrophy and chronic small vessel ischemic changes. No mass  lesion is noted on this unenhanced scan. Ventricular size is stable from prior exam. Vascular: Atherosclerotic calcifications of carotid siphon again noted. Atherosclerotic calcifications of bilateral vertebral arteries. Skull: No skull fracture is noted. Sinuses/Orbits: No acute findings. Other: None IMPRESSION: No acute intracranial abnormality. Stable atrophy and chronic white matter disease. No definite acute cortical infarction. These results were called by telephone at the time of interpretation on 05/21/2016 at 5:40 pm to Dr. Orlie Dakin , who verbally acknowledged these results. Electronically Signed   By: Lahoma Crocker M.D.   On: 05/21/2016 17:40    Procedures Procedures (including critical care time)  Medications Ordered in ED Medications  iopamidol (ISOVUE-370) 76 % injection 75 mL (not administered)   Results for orders placed or performed during the hospital encounter of 05/21/16  Ethanol  Result Value Ref Range   Alcohol, Ethyl (B) <5 <5 mg/dL  Protime-INR  Result Value Ref Range   Prothrombin Time 15.1 11.4 - 15.2 seconds   INR 1.19   APTT  Result Value Ref Range   aPTT 42 (H) 24 - 36 seconds  CBC  Result Value Ref Range   WBC 3.8 (L) 4.0 - 10.5 K/uL   RBC 4.24 4.22 - 5.81 MIL/uL   Hemoglobin 13.2 13.0 - 17.0 g/dL   HCT 39.6 39.0 - 52.0 %   MCV  93.4 78.0 - 100.0 fL   MCH 31.1 26.0 - 34.0 pg   MCHC 33.3 30.0 - 36.0 g/dL   RDW 14.5 11.5 - 15.5 %   Platelets 136 (L) 150 - 400 K/uL  Differential  Result Value Ref Range   Neutrophils Relative % 63 %   Neutro Abs 2.4 1.7 - 7.7 K/uL   Lymphocytes Relative 26 %   Lymphs Abs 1.0 0.7 - 4.0 K/uL   Monocytes Relative 8 %   Monocytes Absolute 0.3 0.1 - 1.0 K/uL   Eosinophils Relative 2 %   Eosinophils Absolute 0.1 0.0 - 0.7 K/uL   Basophils Relative 1 %   Basophils Absolute 0.0 0.0 - 0.1 K/uL  Comprehensive metabolic panel  Result Value Ref Range   Sodium 135 135 - 145 mmol/L   Potassium 3.8 3.5 - 5.1 mmol/L   Chloride 102 101 - 111 mmol/L   CO2 27 22 - 32 mmol/L   Glucose, Bld 111 (H) 65 - 99 mg/dL   BUN 19 6 - 20 mg/dL   Creatinine, Ser 1.04 0.61 - 1.24 mg/dL   Calcium 9.0 8.9 - 10.3 mg/dL   Total Protein 6.7 6.5 - 8.1 g/dL   Albumin 3.8 3.5 - 5.0 g/dL   AST 16 15 - 41 U/L   ALT 11 (L) 17 - 63 U/L   Alkaline Phosphatase 58 38 - 126 U/L   Total Bilirubin 1.3 (H) 0.3 - 1.2 mg/dL   GFR calc non Af Amer 58 (L) >60 mL/min   GFR calc Af Amer >60 >60 mL/min   Anion gap 6 5 - 15  I-Stat Chem 8, ED  (not at [facility], [facility])  Result Value Ref Range   Sodium 136 135 - 145 mmol/L   Potassium 3.8 3.5 - 5.1 mmol/L   Chloride 106 101 - 111 mmol/L   BUN 19 6 - 20 mg/dL   Creatinine, Ser 1.00 0.61 - 1.24 mg/dL   Glucose, Bld 105 (H) 65 - 99 mg/dL   Calcium, Ion 1.22 1.15 - 1.40 mmol/L   TCO2 28 0 - 100 mmol/L   Hemoglobin 13.9 13.0 - 17.0  g/dL   HCT 41.0 39.0 - 52.0 %  I-stat troponin, ED (not at [facility], [facility])  Result Value Ref Range   Troponin i, poc 0.00 0.00 - 0.08 ng/mL   Comment 3           Ct Angio Head W Or Wo Contrast  Result Date: 05/21/2016 CLINICAL DATA:  Stroke.  New left-sided weakness. EXAM: CT ANGIOGRAPHY HEAD AND NECK TECHNIQUE: Multidetector CT imaging of the head and neck was performed using the standard protocol during bolus administration of intravenous contrast.  Multiplanar CT image reconstructions and MIPs were obtained to evaluate the vascular anatomy. Carotid stenosis measurements (when applicable) are obtained utilizing NASCET criteria, using the distal internal carotid diameter as the denominator. CONTRAST:  75 mL Isovue 370 IV COMPARISON:  CT head 05/21/2016 FINDINGS: CTA NECK FINDINGS Aortic arch: Atherosclerotic calcification in the aortic arch. Bovine branching pattern. Atherosclerotic calcification proximal great vessels without significant stenosis. There is extensive venous contrast on the study due to late timing. In addition, there is reflux of dense contrast up the left jugular vein from a left arm injection due to left innominate vein stenosis. Right carotid system: Right common carotid artery widely patent with mild atherosclerotic calcification. Atherosclerotic calcification right carotid bifurcation. No significant internal carotid artery stenosis. Mild stenosis external carotid artery due to calcific plaque. Left carotid system: Left common carotid artery widely patent. Atherosclerotic calcification left carotid bifurcation without significant carotid stenosis. Vertebral arteries: Both vertebral arteries are patent to the basilar without significant stenosis. Skeleton: Mild degenerative changes in the cervical spine. No acute skeletal abnormality. Other neck: Negative for mass or adenopathy Upper chest: Bilateral pleural effusions right greater than left. Lung apices clear. Review of the MIP images confirms the above findings CTA HEAD FINDINGS Anterior circulation: Extensive atherosclerotic calcification in the cavernous carotid bilaterally causing mild stenosis. Anterior middle cerebral arteries patent bilaterally. Mild atherosclerotic irregularity in the middle cerebral artery branches bilaterally. Posterior circulation: Both vertebral arteries patent to the basilar with ectasia due to atherosclerotic disease. The basilar is tortuous and ectatic. No  significant basilar stenosis. PICA patent bilaterally. Superior cerebellar and posterior cerebral arteries patent bilaterally without stenosis. Venous sinuses: Patent Anatomic variants: None Delayed phase: Normal enhancement on delayed imaging. No enhancing mass lesion. Generalized atrophy with chronic microvascular ischemia. Review of the MIP images confirms the above findings IMPRESSION: Atherosclerotic calcification in the carotid bifurcation bilaterally without significant carotid stenosis in the neck. Atherosclerotic calcification and mild stenosis in the cavernous carotid bilaterally. No significant vertebral artery stenosis. Atherosclerotic irregularity in middle cerebral artery branches bilaterally without significant intracranial stenosis. Diffusely ectatic and tortuous intracranial circulation likely due to chronic hypertension. Bilateral pleural effusions right greater than left. Electronically Signed   By: Franchot Gallo M.D.   On: 05/21/2016 19:06   Ct Head Wo Contrast  Result Date: 05/21/2016 CLINICAL DATA:  New left-sided weakness, code stroke EXAM: CT HEAD WITHOUT CONTRAST TECHNIQUE: Contiguous axial images were obtained from the base of the skull through the vertex without intravenous contrast. COMPARISON:  11/20/2015 FINDINGS: Brain: No intracranial hemorrhage, mass effect or midline shift. No definite acute cortical infarction. Stable atrophy and chronic small vessel ischemic changes. No mass lesion is noted on this unenhanced scan. Ventricular size is stable from prior exam. Vascular: Atherosclerotic calcifications of carotid siphon again noted. Atherosclerotic calcifications of bilateral vertebral arteries. Skull: No skull fracture is noted. Sinuses/Orbits: No acute findings. Other: None IMPRESSION: No acute intracranial abnormality. Stable atrophy and chronic white matter disease. No definite  acute cortical infarction. These results were called by telephone at the time of interpretation on  05/21/2016 at 5:40 pm to Dr. Orlie Dakin , who verbally acknowledged these results. Electronically Signed   By: Lahoma Crocker M.D.   On: 05/21/2016 17:40   Ct Angio Neck W And/or Wo Contrast  Result Date: 05/21/2016 CLINICAL DATA:  Stroke.  New left-sided weakness. EXAM: CT ANGIOGRAPHY HEAD AND NECK TECHNIQUE: Multidetector CT imaging of the head and neck was performed using the standard protocol during bolus administration of intravenous contrast. Multiplanar CT image reconstructions and MIPs were obtained to evaluate the vascular anatomy. Carotid stenosis measurements (when applicable) are obtained utilizing NASCET criteria, using the distal internal carotid diameter as the denominator. CONTRAST:  75 mL Isovue 370 IV COMPARISON:  CT head 05/21/2016 FINDINGS: CTA NECK FINDINGS Aortic arch: Atherosclerotic calcification in the aortic arch. Bovine branching pattern. Atherosclerotic calcification proximal great vessels without significant stenosis. There is extensive venous contrast on the study due to late timing. In addition, there is reflux of dense contrast up the left jugular vein from a left arm injection due to left innominate vein stenosis. Right carotid system: Right common carotid artery widely patent with mild atherosclerotic calcification. Atherosclerotic calcification right carotid bifurcation. No significant internal carotid artery stenosis. Mild stenosis external carotid artery due to calcific plaque. Left carotid system: Left common carotid artery widely patent. Atherosclerotic calcification left carotid bifurcation without significant carotid stenosis. Vertebral arteries: Both vertebral arteries are patent to the basilar without significant stenosis. Skeleton: Mild degenerative changes in the cervical spine. No acute skeletal abnormality. Other neck: Negative for mass or adenopathy Upper chest: Bilateral pleural effusions right greater than left. Lung apices clear. Review of the MIP images  confirms the above findings CTA HEAD FINDINGS Anterior circulation: Extensive atherosclerotic calcification in the cavernous carotid bilaterally causing mild stenosis. Anterior middle cerebral arteries patent bilaterally. Mild atherosclerotic irregularity in the middle cerebral artery branches bilaterally. Posterior circulation: Both vertebral arteries patent to the basilar with ectasia due to atherosclerotic disease. The basilar is tortuous and ectatic. No significant basilar stenosis. PICA patent bilaterally. Superior cerebellar and posterior cerebral arteries patent bilaterally without stenosis. Venous sinuses: Patent Anatomic variants: None Delayed phase: Normal enhancement on delayed imaging. No enhancing mass lesion. Generalized atrophy with chronic microvascular ischemia. Review of the MIP images confirms the above findings IMPRESSION: Atherosclerotic calcification in the carotid bifurcation bilaterally without significant carotid stenosis in the neck. Atherosclerotic calcification and mild stenosis in the cavernous carotid bilaterally. No significant vertebral artery stenosis. Atherosclerotic irregularity in middle cerebral artery branches bilaterally without significant intracranial stenosis. Diffusely ectatic and tortuous intracranial circulation likely due to chronic hypertension. Bilateral pleural effusions right greater than left. Electronically Signed   By: Franchot Gallo M.D.   On: 05/21/2016 19:06    Initial Impression / Assessment and Plan / ED Course  I have reviewed the triage vital signs and the nursing notes.  Pertinent labs & imaging results that were available during my care of the patient were reviewed by me and considered in my medical decision making (see chart for details).  Clinical Course   [facility] neurologist Dr.Ortiz evaluated patient. NIH stroke scale counted at 2. Dr. Olevia Bowens recommends CT angiogram of head and neck. If occlusion, patient will require transfer to level of higher  care. He is not a candidate for IV thrombolytics  Patient passed swallowing screen. Dr.Opyd from hospital service consulted and will see patient in hospital. Plan 23 hour observation, telemetry Final Clinical Impressions(s) /  ED Diagnoses  TIA Final diagnoses:  None    New Prescriptions New Prescriptions   No medications on file     Orlie Dakin, MD 05/21/16 2013

## 2016-05-21 NOTE — ED Notes (Signed)
Family was advised that the pt had an order for a heart healthy diet.

## 2016-05-22 ENCOUNTER — Telehealth: Payer: Self-pay | Admitting: Family Medicine

## 2016-05-22 ENCOUNTER — Observation Stay (HOSPITAL_COMMUNITY): Payer: Medicare Other

## 2016-05-22 DIAGNOSIS — Z96642 Presence of left artificial hip joint: Secondary | ICD-10-CM | POA: Diagnosis present

## 2016-05-22 DIAGNOSIS — I482 Chronic atrial fibrillation: Secondary | ICD-10-CM

## 2016-05-22 DIAGNOSIS — E039 Hypothyroidism, unspecified: Secondary | ICD-10-CM

## 2016-05-22 DIAGNOSIS — I6302 Cerebral infarction due to thrombosis of basilar artery: Secondary | ICD-10-CM | POA: Diagnosis not present

## 2016-05-22 DIAGNOSIS — D696 Thrombocytopenia, unspecified: Secondary | ICD-10-CM | POA: Diagnosis present

## 2016-05-22 DIAGNOSIS — I639 Cerebral infarction, unspecified: Secondary | ICD-10-CM | POA: Diagnosis not present

## 2016-05-22 DIAGNOSIS — D72819 Decreased white blood cell count, unspecified: Secondary | ICD-10-CM | POA: Diagnosis present

## 2016-05-22 DIAGNOSIS — Z923 Personal history of irradiation: Secondary | ICD-10-CM | POA: Diagnosis not present

## 2016-05-22 DIAGNOSIS — Z87891 Personal history of nicotine dependence: Secondary | ICD-10-CM | POA: Diagnosis not present

## 2016-05-22 DIAGNOSIS — Z66 Do not resuscitate: Secondary | ICD-10-CM | POA: Diagnosis present

## 2016-05-22 DIAGNOSIS — I10 Essential (primary) hypertension: Secondary | ICD-10-CM | POA: Diagnosis present

## 2016-05-22 DIAGNOSIS — R29898 Other symptoms and signs involving the musculoskeletal system: Secondary | ICD-10-CM | POA: Diagnosis not present

## 2016-05-22 DIAGNOSIS — I619 Nontraumatic intracerebral hemorrhage, unspecified: Secondary | ICD-10-CM | POA: Diagnosis not present

## 2016-05-22 DIAGNOSIS — R410 Disorientation, unspecified: Secondary | ICD-10-CM | POA: Diagnosis not present

## 2016-05-22 DIAGNOSIS — Z7901 Long term (current) use of anticoagulants: Secondary | ICD-10-CM | POA: Diagnosis not present

## 2016-05-22 DIAGNOSIS — Z823 Family history of stroke: Secondary | ICD-10-CM | POA: Diagnosis not present

## 2016-05-22 DIAGNOSIS — Z8546 Personal history of malignant neoplasm of prostate: Secondary | ICD-10-CM | POA: Diagnosis not present

## 2016-05-22 DIAGNOSIS — G8194 Hemiplegia, unspecified affecting left nondominant side: Secondary | ICD-10-CM | POA: Diagnosis present

## 2016-05-22 DIAGNOSIS — F05 Delirium due to known physiological condition: Secondary | ICD-10-CM

## 2016-05-22 DIAGNOSIS — I69952 Hemiplegia and hemiparesis following unspecified cerebrovascular disease affecting left dominant side: Secondary | ICD-10-CM | POA: Diagnosis not present

## 2016-05-22 MED ORDER — HYDRALAZINE HCL 20 MG/ML IJ SOLN: 10.0000 mg | Freq: Four times a day (QID) | INTRAMUSCULAR | Status: DC | PRN

## 2016-05-22 MED ORDER — HALOPERIDOL LACTATE 5 MG/ML IJ SOLN
1.0000 mg | Freq: Four times a day (QID) | INTRAMUSCULAR | Status: DC | PRN
Start: 2016-05-22 — End: 2016-05-25

## 2016-05-22 NOTE — Consult Note (Signed)
[place] A. Ethan Laughter, MD     [email or web address]          Ethan Faulkner is an 82 y.o. male.   ASSESSMENT/PLAN: 1. Acute right hemispheric stroke involving the internal capsule and basal ganglia. This is most likely due to atrial fibrillation although the patient is on anticoagulation and low-dose aspirin. No additional recommendations is suggested at this time. It is unclear if switching to a different agent will be of any value. I will therefore recommend continue with the current care. He should continue with risk factor modification. He already has speech, physical and occupational therapy in place.     The patient is a 82 year old white male who is quite functional at baseline. Lives alone by himself and is cognitively intact but does have some difficulties ambulating. It appears that he was ambulating without any issues onto he had a fall June of this year. Since then he has had to be getting around with a walker. He has had no difficulties with this however per the daughters. They report that he is highly functional and the spine itself. The patient presented with the acute onset of left-sided weakness. It appears that he has had some dysarthria in addition. The family reports that since being hospitalized, the patient has been somewhat restless and the moderate confused to time. They do have concerns that he has been up all last night and hasn't slept well today either. Review systems is limited given the cognitive impairment.     GENERAL: Thin man in no acute distress.  HEENT: Normal  ABDOMEN: soft  EXTREMITIES: No edema. Mild arthritic changes of the knees and hands.   BACK: Normal  SKIN: Normal by inspection.    MENTAL STATUS: He lays in bed with eyes closed. He does open his eyes to light sternal rub. He does follow commands. He is oriented to person and city but does not recognizes that he is in the hospital. Speech is quite dysarthric. The patient  states his age but is not oriented to time including month. No clear neglect appreciated.  CRANIAL NERVES: Pupils are equal, round and reactive to light and accomodation; extra ocular movements are full, there is no significant nystagmus; visual fields are full; upper and lower facial muscles are normal in strength and symmetric, there is no flattening of the nasolabial folds; tongue is midline.  MOTOR: Bulk and tone are normal throughout. Upper extremity strength is 4+/5. Left leg 4+/5. And right leg 5/5. He has a mild drift of the left leg otherwise other extremities are normal.  COORDINATION: Left finger to nose is normal, right finger to nose is normal, No rest tremor; no intention tremor; no postural tremor; no bradykinesia.  REFLEXES: Deep tendon reflexes are symmetrical and normal.    SENSATION: Response to painful stem light bilaterally.    NIH stroke scale 1, 1, 2, 1 total 4.     Blood pressure (!) 155/86, pulse 61, temperature 97.5 F (36.4 C), temperature source Axillary, resp. rate 18, height '5\' 8"'  (1.727 m), weight 155 lb 10.3 oz (70.6 kg), SpO2 96 %.  Past Medical History:  Diagnosis Date  . Afib ([place])   . Hypertension   . Hypothyroidism   . Migraine    "maybe monthly" (11/21/2015)  . Prostate cancer ([place]) ~ 1995   S/P radiation tx  . Renal mass    "slow growing something; doctors just watching it right now" (11/21/2015)  . Thyroid disease    hypothyroid  Past Surgical History:  Procedure Laterality Date  . COLONOSCOPY  1999  . ESOPHAGOGASTRODUODENOSCOPY    . LAPAROSCOPIC CHOLECYSTECTOMY  1980s  . PROSTATE BIOPSY  ~ 1995  . TOTAL HIP ARTHROPLASTY Left 11/22/2015   Procedure: LEFT HIP HEMIARTHROPLASTY ;  Surgeon: Leandrew Koyanagi, MD;  Location: [facility];  Service: Orthopedics;  Laterality: Left;    Family History  Problem Relation Age of Onset  . Stroke Sister     in her 56s  . CAD Neg Hx   . COPD Neg Hx   . Diabetes Neg Hx   . Heart disease Neg Hx      Social History:  reports that he has quit smoking. He has never used smokeless tobacco. He reports that he drinks alcohol. He reports that he does not use drugs.  Allergies:  Allergies  Allergen Reactions  . Xanax [Alprazolam] Other (See Comments)    Dizzy, Sedated  . Tape Rash    PATIENT'S SKIN WILL TEAR/PLEASE EITHER USE PAPER TAPE OR COBAN WRAP!!    Medications: Prior to Admission medications   Medication Sig Start Date End Date Taking? Authorizing Provider  acetaminophen (TYLENOL) 325 MG tablet Take 325-650 mg by mouth every 6 (six) hours as needed for mild pain or moderate pain.   Yes Historical Provider, MD  amLODipine (NORVASC) 2.5 MG tablet Take 1 tablet (2.5 mg total) by mouth daily. 05/11/16  Yes Kathyrn Drown, MD  apixaban (ELIQUIS) 5 MG TABS tablet TAKE (1) TABLET BY MOUTH TWICE DAILY. 01/13/16  Yes Kathyrn Drown, MD  cetirizine (ZYRTEC) 10 MG tablet Take 10 mg by mouth daily.   Yes Historical Provider, MD  levothyroxine (SYNTHROID, LEVOTHROID) 25 MCG tablet TAKE (1) TABLET BY MOUTH EACH MORNING. 01/13/16  Yes Kathyrn Drown, MD  metoprolol succinate (TOPROL-XL) 25 MG 24 hr tablet Take 1 tablet (25 mg total) by mouth daily. Patient taking differently: Take 25 mg by mouth every evening.  01/13/16  Yes Scott A Luking, MD  polyethylene glycol powder (GLYCOLAX/MIRALAX) powder MIX 1 CAPFUL IN 8 OZ OF WATER AND DRINK DAILY. Patient taking differently: MIX 1 CAPFUL IN 8 OZ OF WATER AND DRINK DAILY AS NEEDED FOR CONSTIPATION 05/11/16  Yes Kathyrn Drown, MD  alendronate (FOSAMAX) 70 MG tablet Take 1 tablet (70 mg total) by mouth every 7 (seven) days. Take with a full glass of water on an empty stomach. Patient not taking: Reported on 05/21/2016 03/09/16   Kathyrn Drown, MD  aspirin EC 81 MG tablet Take 1 tablet (81 mg total) by mouth daily. 05/21/16   Vianne Bulls, MD    Scheduled Meds: . apixaban  5 mg Oral BID  . levothyroxine  25 mcg Oral QAC breakfast  . loratadine  10  mg Oral Daily  . metoprolol succinate  25 mg Oral QPM   Continuous Infusions: PRN Meds:.acetaminophen **OR** acetaminophen (TYLENOL) oral liquid 160 mg/5 mL **OR** acetaminophen, acetaminophen, haloperidol lactate, hydrALAZINE, polyethylene glycol     Results for orders placed or performed during the hospital encounter of 05/21/16 (from the past 48 hour(s))  Ethanol     Status: None   Collection Time: 05/21/16  5:26 PM  Result Value Ref Range   Alcohol, Ethyl (B) <5 <5 mg/dL    Comment:        LOWEST DETECTABLE LIMIT FOR SERUM ALCOHOL IS 5 mg/dL FOR MEDICAL PURPOSES ONLY   Protime-INR     Status: None   Collection Time: 05/21/16  5:26  PM  Result Value Ref Range   Prothrombin Time 15.1 11.4 - 15.2 seconds   INR 1.19   APTT     Status: Abnormal   Collection Time: 05/21/16  5:26 PM  Result Value Ref Range   aPTT 42 (H) 24 - 36 seconds    Comment:        IF BASELINE aPTT IS ELEVATED, SUGGEST PATIENT RISK ASSESSMENT BE USED TO DETERMINE APPROPRIATE ANTICOAGULANT THERAPY.   CBC     Status: Abnormal   Collection Time: 05/21/16  5:26 PM  Result Value Ref Range   WBC 3.8 (L) 4.0 - 10.5 K/uL   RBC 4.24 4.22 - 5.81 MIL/uL   Hemoglobin 13.2 13.0 - 17.0 g/dL   HCT 39.6 39.0 - 52.0 %   MCV 93.4 78.0 - 100.0 fL   MCH 31.1 26.0 - 34.0 pg   MCHC 33.3 30.0 - 36.0 g/dL   RDW 14.5 11.5 - 15.5 %   Platelets 136 (L) 150 - 400 K/uL  Differential     Status: None   Collection Time: 05/21/16  5:26 PM  Result Value Ref Range   Neutrophils Relative % 63 %   Neutro Abs 2.4 1.7 - 7.7 K/uL   Lymphocytes Relative 26 %   Lymphs Abs 1.0 0.7 - 4.0 K/uL   Monocytes Relative 8 %   Monocytes Absolute 0.3 0.1 - 1.0 K/uL   Eosinophils Relative 2 %   Eosinophils Absolute 0.1 0.0 - 0.7 K/uL   Basophils Relative 1 %   Basophils Absolute 0.0 0.0 - 0.1 K/uL  Comprehensive metabolic panel     Status: Abnormal   Collection Time: 05/21/16  5:26 PM  Result Value Ref Range   Sodium 135 135 - 145 mmol/L    Potassium 3.8 3.5 - 5.1 mmol/L   Chloride 102 101 - 111 mmol/L   CO2 27 22 - 32 mmol/L   Glucose, Bld 111 (H) 65 - 99 mg/dL   BUN 19 6 - 20 mg/dL   Creatinine, Ser 1.04 0.61 - 1.24 mg/dL   Calcium 9.0 8.9 - 10.3 mg/dL   Total Protein 6.7 6.5 - 8.1 g/dL   Albumin 3.8 3.5 - 5.0 g/dL   AST 16 15 - 41 U/L   ALT 11 (L) 17 - 63 U/L   Alkaline Phosphatase 58 38 - 126 U/L   Total Bilirubin 1.3 (H) 0.3 - 1.2 mg/dL   GFR calc non Af Amer 58 (L) >60 mL/min   GFR calc Af Amer >60 >60 mL/min    Comment: (NOTE) The eGFR has been calculated using the CKD EPI equation. This calculation has not been validated in all clinical situations. eGFR's persistently <60 mL/min signify possible Chronic Kidney Disease.    Anion gap 6 5 - 15  TSH     Status: None   Collection Time: 05/21/16  5:26 PM  Result Value Ref Range   TSH 2.570 0.350 - 4.500 uIU/mL    Comment: Performed by a 3rd Generation assay with a functional sensitivity of <=0.01 uIU/mL.  I-stat troponin, ED (not at [facility], [facility])     Status: None   Collection Time: 05/21/16  5:49 PM  Result Value Ref Range   Troponin i, poc 0.00 0.00 - 0.08 ng/mL   Comment 3            Comment: Due to the release kinetics of cTnI, a negative result within the first hours of the onset of symptoms does not rule out myocardial  infarction with certainty. If myocardial infarction is still suspected, repeat the test at appropriate intervals.   I-Stat Chem 8, ED  (not at [facility], [facility])     Status: Abnormal   Collection Time: 05/21/16  5:51 PM  Result Value Ref Range   Sodium 136 135 - 145 mmol/L   Potassium 3.8 3.5 - 5.1 mmol/L   Chloride 106 101 - 111 mmol/L   BUN 19 6 - 20 mg/dL   Creatinine, Ser 1.00 0.61 - 1.24 mg/dL   Glucose, Bld 105 (H) 65 - 99 mg/dL   Calcium, Ion 1.22 1.15 - 1.40 mmol/L   TCO2 28 0 - 100 mmol/L   Hemoglobin 13.9 13.0 - 17.0 g/dL   HCT 41.0 39.0 - 52.0 %  Urine rapid drug screen (hosp performed)not at [facility]     Status: None    Collection Time: 05/21/16  8:34 PM  Result Value Ref Range   Opiates NONE DETECTED NONE DETECTED   Cocaine NONE DETECTED NONE DETECTED   Benzodiazepines NONE DETECTED NONE DETECTED   Amphetamines NONE DETECTED NONE DETECTED   Tetrahydrocannabinol NONE DETECTED NONE DETECTED   Barbiturates NONE DETECTED NONE DETECTED    Comment:        DRUG SCREEN FOR MEDICAL PURPOSES ONLY.  IF CONFIRMATION IS NEEDED FOR ANY PURPOSE, NOTIFY LAB WITHIN 5 DAYS.        LOWEST DETECTABLE LIMITS FOR URINE DRUG SCREEN Drug Class       Cutoff (ng/mL) Amphetamine      1000 Barbiturate      200 Benzodiazepine   809 Tricyclics       983 Opiates          300 Cocaine          300 THC              50   Urinalysis, Routine w reflex microscopic     Status: Abnormal   Collection Time: 05/21/16  8:34 PM  Result Value Ref Range   Color, Urine YELLOW YELLOW   APPearance CLEAR CLEAR   Specific Gravity, Urine 1.033 (H) 1.005 - 1.030   pH 6.0 5.0 - 8.0   Glucose, UA NEGATIVE NEGATIVE mg/dL   Hgb urine dipstick SMALL (A) NEGATIVE   Bilirubin Urine NEGATIVE NEGATIVE   Ketones, ur NEGATIVE NEGATIVE mg/dL   Protein, ur NEGATIVE NEGATIVE mg/dL   Nitrite NEGATIVE NEGATIVE   Leukocytes, UA NEGATIVE NEGATIVE   RBC / HPF 0-5 0 - 5 RBC/hpf   WBC, UA 0-5 0 - 5 WBC/hpf   Bacteria, UA NONE SEEN NONE SEEN    Studies/Results:   BRAIN MRI FINDINGS: MRI HEAD FINDINGS  Brain: Confluent area of restricted diffusion in the low genu of the internal capsule on the right. This area measures up to 19 mm and has a wedge-shaped on coronal acquisition. Punctate acute infarct at the medial edge of the left central sulcus. Apparent areas of DWI hyperintensity in the posterior left centrum semiovale are likely related to artifact from neighboring mineralization.  No acute hemorrhage. Chronic blood products in the left cerebral white matter, bilateral cerebellum, right thalamus, and bilateral posterior cerebral cortex. The  overall pattern suggests hypertensive hemorrhages. Chronic small-vessel disease with ischemic gliosis confluent around the lateral ventricles and patchy throughout the pons.  No mass or hydrocephalus.  Vascular: Arterial findings below. Some dephasing in the dural venous sinuses, which were patent on CTA yesterday.  Skull and upper cervical spine: Negative  Sinuses/Orbits: Negative  Other: Motion  degraded exam which could obscure or cingulate Pathology.      The brain MRI scan is reviewed in person. There is an acute to moderate size infarct seen on DWI involving the right basal ganglia and internal capsule extending for a few cuts. The corresponding area is also hypo-intense on the ADC scans. There is moderate global atrophy. There are two chronic encephalomalacia consistent with small infarcts in the cerebellum. These areas are also associated with reduced signal on SWI indicating microhemorrhage. There are other areas of microhemorrhage involving the cerebrum bilaterally. The pattern is concerning for amyloid angiopathy although hypertensive like ischemic changes is statistically most likely.     CTA HEAD NECK FINDINGS: CTA NECK FINDINGS  Aortic arch: Atherosclerotic calcification in the aortic arch. Bovine branching pattern. Atherosclerotic calcification proximal great vessels without significant stenosis.  There is extensive venous contrast on the study due to late timing. In addition, there is reflux of dense contrast up the left jugular vein from a left arm injection due to left innominate vein stenosis.  Right carotid system: Right common carotid artery widely patent with mild atherosclerotic calcification. Atherosclerotic calcification right carotid bifurcation. No significant internal carotid artery stenosis. Mild stenosis external carotid artery due to calcific plaque.  Left carotid system: Left common carotid artery widely patent. Atherosclerotic  calcification left carotid bifurcation without significant carotid stenosis.  Vertebral arteries: Both vertebral arteries are patent to the basilar without significant stenosis.  Skeleton: Mild degenerative changes in the cervical spine. No acute skeletal abnormality.  Other neck: Negative for mass or adenopathy  Upper chest: Bilateral pleural effusions right greater than left. Lung apices clear.  Review of the MIP images confirms the above findings  CTA HEAD FINDINGS  Anterior circulation: Extensive atherosclerotic calcification in the cavernous carotid bilaterally causing mild stenosis. Anterior middle cerebral arteries patent bilaterally. Mild atherosclerotic irregularity in the middle cerebral artery branches bilaterally.  Posterior circulation: Both vertebral arteries patent to the basilar with ectasia due to atherosclerotic disease. The basilar is tortuous and ectatic. No significant basilar stenosis. PICA patent bilaterally. Superior cerebellar and posterior cerebral arteries patent bilaterally without stenosis.  Venous sinuses: Patent  Anatomic variants: None  Delayed phase: Normal enhancement on delayed imaging. No enhancing mass lesion. Generalized atrophy with chronic microvascular ischemia.  Review of the MIP images confirms the above findings  IMPRESSION: Atherosclerotic calcification in the carotid bifurcation bilaterally without significant carotid stenosis in the neck. Atherosclerotic calcification and mild stenosis in the cavernous carotid bilaterally.  No significant vertebral artery stenosis.  Atherosclerotic irregularity in middle cerebral artery branches bilaterally without significant intracranial stenosis.  Diffusely ectatic and tortuous intracranial circulation likely due to chronic hypertension.  Bilateral pleural effusions right greater than left.    Ethan Faulkner A. Ethan Faulkner, M.D.  Diplomate, Tax adviser of Psychiatry  and Neurology ( Neurology). 05/22/2016, 6:25 PM

## 2016-05-22 NOTE — Evaluation (Signed)
Clinical/Bedside Swallow Evaluation Patient Details  Name: Ethan Faulkner MRN: [id number] Date of Birth: [birth year]  Today's Date: 05/22/2016 Time: SLP Start Time (ACUTE ONLY): [id number] SLP Stop Time (ACUTE ONLY): 1801 SLP Time Calculation (min) (ACUTE ONLY): 31 min  Past Medical History:  Past Medical History:  Diagnosis Date  . Afib ([place])   . Hypertension   . Hypothyroidism   . Migraine    "maybe monthly" (11/21/2015)  . Prostate cancer ([place]) ~ 1995   S/P radiation tx  . Renal mass    "slow growing something; doctors just watching it right now" (11/21/2015)  . Thyroid disease    hypothyroid   Past Surgical History:  Past Surgical History:  Procedure Laterality Date  . COLONOSCOPY  1999  . ESOPHAGOGASTRODUODENOSCOPY    . LAPAROSCOPIC CHOLECYSTECTOMY  1980s  . PROSTATE BIOPSY  ~ 1995  . TOTAL HIP ARTHROPLASTY Left 11/22/2015   Procedure: LEFT HIP HEMIARTHROPLASTY ;  Surgeon: Leandrew Koyanagi, MD;  Location: [facility];  Service: Orthopedics;  Laterality: Left;   HPI:  Ethan Chapla Higdonis a 82 y.o.malewith medical history significant for hypertension, hypothyroidism, chronic atrial fibrillation on a look was, and chronic leukopenia and thrombocytopenia who presents to the emergency department with weakness involving the left arm, left leg, and left face, with mild confusion. Patient reportedly had trouble sleeping last night, but had otherwise been in his usual state of health. He lives alone, but had family members with him this afternoon when he was noted at approximately 2:15 PM to have a left facial droop and weakness involving the left arm and leg. He was speaking fluently, but seemed to be a little bit confused. He had trouble ambulating due to his apparent left leg weakness. Head MRA reveals acute perforator infarct affecting the low right internal capsule and Punctate acute infarct at the median left central sulcus.  Assessment / Plan / Recommendation Clinical Impression  Pt was  evaluated at bedside during meal with family in the room. Oral mechanism exam revealed left oral weakness and slight left facial droop presented when smiling; note oral weakness and decreased hyolaryngeal excursion very possibly related to aging. Pt consumed mechanical soft and puree textures demonstrating consistent wet vocal quality, mild left pocketing and mild oral residue after the swallow. Pt consumed thin liquids demonstrating consistent mild wet vocal quality that was cleared with verbal cues to throat clear and provide an additional swallow. With trials of NTL pt demonstrated identical symptoms of wet vocal quality that was cleared with cues to provide an additional swallow and throat clear. Pt consumed meds whole with liquids while SLP was at bedside without incident. Pt was very alert and consistenly followed all recommendations and strategies. Recommend downgrade to mechanical soft and continue with thin liquids; meds to be administered whole with thin liquids. Recommend ST continue to monitor while in acute setting and ST evaluation and f/u for dysphagia and cognition in d/c setting (recommend SNF).    Aspiration Risk  Mild aspiration risk    Diet Recommendation Dysphagia 3 (Mech soft);Thin liquid   Liquid Administration via: Cup;Straw Medication Administration: Whole meds with liquid Postural Changes: Seated upright at 90 degrees    Other  Recommendations Oral Care Recommendations: Oral care BID   Follow up Recommendations Skilled Nursing facility;24 hour supervision/assistance      Frequency and Duration min 1 x/week  2 weeks       Prognosis Prognosis for Safe Diet Advancement: Fair      Swallow Study  General Date of Onset: 05/21/16 HPI: Ethan Dorrance Higdonis a 82 y.o.malewith medical history significant for hypertension, hypothyroidism, chronic atrial fibrillation on a look was, and chronic leukopenia and thrombocytopenia who presents to the emergency department with  weakness involving the left arm, left leg, and left face, with mild confusion. Patient reportedly had trouble sleeping last night, but had otherwise been in his usual state of health. He lives alone, but had family members with him this afternoon when he was noted at approximately 2:15 PM to have a left facial droop and weakness involving the left arm and leg. He was speaking fluently, but seemed to be a little bit confused. He had trouble ambulating due to his apparent left leg weakness. Head MRA reveals acute perforator infarct affecting the low right internal Type of Study: Bedside Swallow Evaluation Previous Swallow Assessment: none Diet Prior to this Study: NPO Temperature Spikes Noted: No Respiratory Status: Room air History of Recent Intubation: No Oral Cavity Assessment: Within Functional Limits Oral Care Completed by SLP: Recent completion by staff Oral Cavity - Dentition: Dentures, top;Dentures, bottom Vision: Functional for self-feeding Self-Feeding Abilities: Able to feed self;Needs assist;Needs set up Patient Positioning: Upright in bed Baseline Vocal Quality: Hoarse;Low vocal intensity Volitional Cough: Strong Volitional Swallow: Able to elicit    Oral/Motor/Sensory Function Overall Oral Motor/Sensory Function: Mild impairment Facial ROM: Reduced left Facial Symmetry: Abnormal symmetry left Facial Strength: Reduced left Facial Sensation: Within Functional Limits Lingual ROM: Within Functional Limits   Ice Chips Ice chips: Within functional limits   Thin Liquid Thin Liquid: Impaired Presentation: Cup;Straw Oral Phase Impairments: Reduced lingual movement/coordination Pharyngeal  Phase Impairments: Suspected delayed Swallow;Wet Vocal Quality    Nectar Thick Nectar Thick Liquid: Impaired Presentation: Cup Oral Phase Impairments: Reduced labial seal Pharyngeal Phase Impairments: Suspected delayed Swallow;Wet Vocal Quality   Honey Thick Honey Thick Liquid: Not tested    Puree Puree: Impaired Presentation: Spoon Oral Phase Impairments: Reduced lingual movement/coordination Oral Phase Functional Implications: Prolonged oral transit Pharyngeal Phase Impairments: Suspected delayed Swallow;Multiple swallows;Wet Vocal Quality   Solid   GO   Solid: Impaired Presentation: Self Fed;Spoon Oral Phase Impairments: Reduced lingual movement/coordination;Poor awareness of bolus Oral Phase Functional Implications: Prolonged oral transit Pharyngeal Phase Impairments: Suspected delayed Swallow;Multiple swallows    Functional Assessment Tool Used: clinical judgement Functional Limitations: Swallowing Swallow Current Status [id number]): At least 20 percent but less than 40 percent impaired, limited or restricted Swallow Goal Status [phone]): At least 20 percent but less than 40 percent impaired, limited or restricted Swallow Discharge Status [phone]): At least 20 percent but less than 40 percent impaired, limited or restricted    Ethan Faulkner, [place] Speech Language Pathologist    Wende Bushy 05/22/2016,6:26 PM

## 2016-05-22 NOTE — Progress Notes (Signed)
OT Cancellation Note  Patient Details Name: Ethan Faulkner MRN: [id number] DOB: 05-22-1916   Cancelled Treatment:     Reason evaluation not completed: Pt unavailable. Pt off floor for testing, will check back later today if possible.    Guadelupe Sabin, OTR/L  [phone] 05/22/2016, 8:56 AM

## 2016-05-22 NOTE — Telephone Encounter (Signed)
Please let family know that I am aware of this. Certainly we are hoping he gets better quickly. If he gets discharged over the weekend we will one to see him in follow-up next week. Please make sure that they call us to set up follow-up.

## 2016-05-22 NOTE — Clinical Social Work Note (Signed)
Clinical Social Work Assessment  Patient Details  Name: Ethan Faulkner MRN: [id number] Date of Birth: August 05, 1915  Date of referral:  05/22/16               Reason for consult:  Discharge Planning                Permission sought to share information with:  Family Supports Permission granted to share information::  Yes, Verbal Permission Granted  Name::     Laverta [place]  Agency::     Relationship::  children  Contact Information:     Housing/Transportation Living arrangements for the past 2 months:  Single Family Home Source of Information:  Patient, Adult Children Patient Interpreter Needed:  None Criminal Activity/Legal Involvement Pertinent to Current Situation/Hospitalization:  No - Comment as needed Significant Relationships:  Adult Children Lives with:  Self Do you feel safe going back to the place where you live?   (needs rehab first) Need for family participation in patient care:  Yes (Comment)  Care giving concerns:  Pt lives alone and is not at baseline.    Social Worker assessment / plan:  CSW met with pt and pt's son, Elta Guadeloupe at bedside. Pt eating lunch and did not participate in assessment. Elta Guadeloupe states that pt lives alone and generally manages well. He has several children who live locally and are involved. Pt admitted due to stroke. PT evaluated pt and recommend SNF. CSW discussed placement process, including Medicare coverage/criteria. They are aware that at this time pt is observation, but is expected to be inpatient per CM. Discussed private pay as alternative if pt does not have qualifying 3 day stay. Family aware. Also discussed with pt's daughter, [name] as Elta Guadeloupe reports that she generally handles this. They request referral be sent to [facility].   Employment status:  Retired Forensic scientist:  [employer] PT Recommendations:  [place] / Referral to community resources:  [place]  Patient/Family's Response to care:  Pt's  family requests referral be sent to [facility] for SNF as pt has been there in the past.   Patient/Family's Understanding of and Emotional Response to Diagnosis, Current Treatment, and Prognosis:  Family is aware of admission diagnosis and treatment plan.   Emotional Assessment Appearance:  Appears stated age Attitude/Demeanor/Rapport:  Unable to Assess Affect (typically observed):  Unable to Assess Orientation:  Oriented to Self, Oriented to Place, Oriented to Situation Alcohol / Substance use:  Not Applicable Psych involvement (Current and /or in the community):  No (Comment)  Discharge Needs  Concerns to be addressed:  Discharge Planning Concerns Readmission within the last 30 days:  No Current discharge risk:  Physical Impairment Barriers to Discharge:  Continued Medical Work up   Salome Arnt, [place] 05/22/2016, 1:29 PM [phone]

## 2016-05-22 NOTE — NC FL2 (Signed)
[place] LEVEL OF CARE SCREENING TOOL     IDENTIFICATION  Patient Name: Ethan Faulkner Birthdate: [birth year] Sex: male Admission Date (Current Location): 05/21/2016  [facility] and [place] Number:  [employer] and Address:  [place] [address], [place]      Provider Number: [id number]  Attending Physician Name and Address:  Thurnell Lose, MD  Relative Name and Phone Number:       Current Level of Care: Hospital Recommended Level of Care: [place] Prior Approval Number:    Date Approved/Denied:   PASRR Number: [id number]  Discharge Plan: SNF    Current Diagnoses: Patient Active Problem List   Diagnosis Date Noted  . TIA (transient ischemic attack) 05/21/2016  . Left leg weakness   . Leukopenia   . Thrombocytopenia ([place])   . Fall   . Acute blood loss anemia   . Femoral neck fracture ([place]) 11/20/2015  . Renal malignant tumor ([place]) 06/06/2015  . Long-term (current) use of anticoagulants 04/29/2015  . Subclinical hypothyroidism 03/01/2014  . Atrial fibrillation ([place]) 06/09/2013  . STRESS FRACTURE, FOOT 12/12/2008  . CLOSED FRACTURE OF METATARSAL BONE 12/12/2008    Orientation RESPIRATION BLADDER Height & Weight     Self, Time, Situation, Place  Normal Incontinent Weight: 155 lb 10.3 oz (70.6 kg) Height:  5\' 8"  (172.7 cm)  BEHAVIORAL SYMPTOMS/MOOD NEUROLOGICAL BOWEL NUTRITION STATUS  Other (Comment) (none)  (n/a) Continent Diet (Regular)  AMBULATORY STATUS COMMUNICATION OF NEEDS Skin   Limited Assist Verbally Normal                       Personal Care Assistance Level of Assistance  Bathing, Feeding, Dressing Bathing Assistance: Limited assistance Feeding assistance: Limited assistance Dressing Assistance: Limited assistance     Functional Limitations Info  Sight, Hearing, Speech Sight Info: Impaired Hearing Info: Impaired Speech Info: Adequate    SPECIAL CARE FACTORS  FREQUENCY  PT (By licensed PT)     PT Frequency: 5              Contractures      Additional Factors Info  Psychotropic Code Status Info: Full code Allergies Info: Xanax (Alprazolam), Tape Psychotropic Info: Haldol         Current Medications (05/22/2016):  This is the current hospital active medication list Current Facility-Administered Medications  Medication Dose Route Frequency Provider Last Rate Last Dose  . acetaminophen (TYLENOL) tablet 650 mg  650 mg Oral Q4H PRN Vianne Bulls, MD       Or  . acetaminophen (TYLENOL) solution 650 mg  650 mg Per Tube Q4H PRN Vianne Bulls, MD       Or  . acetaminophen (TYLENOL) suppository 650 mg  650 mg Rectal Q4H PRN Vianne Bulls, MD      . acetaminophen (TYLENOL) tablet 325-650 mg  325-650 mg Oral Q6H PRN Vianne Bulls, MD      . apixaban (ELIQUIS) tablet 5 mg  5 mg Oral BID Vianne Bulls, MD   5 mg at 05/22/16 1004  . haloperidol lactate (HALDOL) injection 1 mg  1 mg Intravenous Q6H PRN Thurnell Lose, MD      . hydrALAZINE (APRESOLINE) injection 10 mg  10 mg Intravenous Q6H PRN Thurnell Lose, MD      . levothyroxine (SYNTHROID, LEVOTHROID) tablet 25 mcg  25 mcg Oral QAC breakfast Vianne Bulls, MD   25  mcg at 05/22/16 0821  . loratadine (CLARITIN) tablet 10 mg  10 mg Oral Daily Vianne Bulls, MD   10 mg at 05/22/16 1004  . metoprolol succinate (TOPROL-XL) 24 hr tablet 25 mg  25 mg Oral QPM Vianne Bulls, MD   25 mg at 05/21/16 2254  . polyethylene glycol (MIRALAX / GLYCOLAX) packet 17 g  17 g Oral Daily PRN Vianne Bulls, MD         Discharge Medications: Please see discharge summary for a list of discharge medications.  Relevant Imaging Results:  Relevant Lab Results:   Additional Information SSN: [id number]  Salome Arnt, [place]

## 2016-05-22 NOTE — Evaluation (Signed)
Physical Therapy Evaluation Patient Details Name: Ethan Faulkner MRN: [id number] DOB: [birth year] Today's Date: 05/22/2016   History of Present Illness  Ethan Faulkner is a 82 y.o. male with medical history significant for hypertension, hypothyroidism, chronic atrial fibrillation on a look was, and chronic leukopenia and thrombocytopenia who presents to the emergency department with weakness involving the left arm, left leg, and left face, with mild confusion. Patient reportedly had trouble sleeping last night, but had otherwise been in his usual state of health. He lives alone, but had family members with him this afternoon when he was noted at approximately 2:15 PM to have a left facial droop and weakness involving the left arm and leg. He was speaking fluently, but seemed to be a little bit confused. He had trouble ambulating due to his apparent left leg weakness. Symptoms persisted and EMS was activated this evening. En route to the hospital, EMS reports improvement in his weakness. There has been no recent illness reported, no fevers or chills, and no vomiting or diarrhea. There has been no recent fall or head trauma. No recent change in medications. Patient does not use illicit substances or alcohol.  Clinical Impression  Ethan Faulkner is a 82 yo male who lives by himself.  He was able to complete bed mobility with minimal assist but is impulsive and is not safe with his activity.  He did well walking for 12 feet but then seemed to freeze and not know what to do next. He will benefit from skilled physical therapy to improve his safety and decrease his risk of falling.     Follow Up Recommendations SNF    Equipment Recommendations  None recommended by PT    Recommendations for Other Services       Precautions / Restrictions Precautions Precautions: None Restrictions Weight Bearing Restrictions: No      Mobility  Bed Mobility Overal bed mobility: Needs Assistance Bed Mobility:  Supine to Sit;Sit to Supine     Supine to sit: Min assist Sit to supine: Min assist      Transfers Overall transfer level: Modified independent Equipment used: Rolling walker (2 wheeled)                Ambulation/Gait Ambulation/Gait assistance: Min assist;Mod assist Ambulation Distance (Feet): 25 Feet Assistive device: Rolling walker (2 wheeled) Gait Pattern/deviations: Trunk flexed;Decreased step length - left;Decreased step length - right   Gait velocity interpretation: Below normal speed for age/gender General Gait Details: Pt got have way with stand by assist; at this time he froze as if he was not sure what to do and it was moderate assist to get back to his bed.   Stairs            Wheelchair Mobility    Modified Rankin (Stroke Patients Only)       Balance                                             Pertinent Vitals/Pain Pain Assessment: No/denies pain    Home Living Family/patient expects to be discharged to:: Skilled nursing facility                      Prior Function           Comments: Pt lived by himself but family did his shopping and cleaning  Hand Dominance        Extremity/Trunk Assessment        Lower Extremity Assessment Lower Extremity Assessment: Generalized weakness       Communication   Communication: Expressive difficulties  Cognition Arousal/Alertness: Awake/alert Behavior During Therapy: Impulsive Overall Cognitive Status: Within Functional Limits for tasks assessed                      General Comments      Exercises General Exercises - Lower Extremity Ankle Circles/Pumps: Both;10 reps Long Arc Quad: Both;10 reps Straight Leg Raises: Both;10 reps   Assessment/Plan    PT Assessment Patient needs continued PT services  PT Problem List Decreased strength;Decreased activity tolerance;Decreased balance;Decreased mobility;Decreased cognition          PT  Treatment Interventions Gait training;Functional mobility training;Therapeutic activities;Therapeutic exercise;Balance training    PT Goals (Current goals can be found in the Care Plan section)  Acute Rehab PT Goals PT Goal Formulation: With patient/family Time For Goal Achievement: 05/26/16 Potential to Achieve Goals: Good    Frequency Min 5X/week   Barriers to discharge        Co-evaluation               End of Session Equipment Utilized During Treatment: Gait belt Activity Tolerance: Patient limited by fatigue Patient left: in bed;with call bell/phone within reach Nurse Communication: Mobility status         Time: 1035-1100 PT Time Calculation (min) (ACUTE ONLY): 25 min   Charges:   PT Evaluation $PT Eval Moderate Complexity: 1 Procedure     PT G CodesRayetta Humphrey, PT CLT [phone] 05/22/2016, 11:17 AM

## 2016-05-22 NOTE — Progress Notes (Addendum)
PROGRESS NOTE                                                                                                                                                                                                             Patient Demographics:    Ethan Faulkner, is a 82 y.o. male, DOB - [birth year], [id number]  Admit date - 05/21/2016   Admitting Physician Vianne Bulls, MD  Outpatient Primary MD for the patient is Sallee Lange, MD  LOS - 0  Chief Complaint  Patient presents with  . Code Stroke       Brief Narrative  Ethan Faulkner is a 82 y.o. male with medical history significant for hypertension, hypothyroidism, chronic atrial fibrillation on a look was, and chronic leukopenia and thrombocytopenia who presents to the emergency department with weakness involving the left arm, left leg, and left face, with mild confusion.  Other workup suggested bilateral cerebral infarcts along with delirium.   Subjective:    [name] today has, No headache, No chest pain, No abdominal pain - No Nausea, No new weakness tingling or numbness, No Cough - SOB. Unreliable historian.   Assessment  & Plan :    1. 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 Continue supportive care. Likely SNF placement tomorrow.  2. Chronic atrial fibrillation [place] vasc 2 score of now 4. Continue Eliquis and beta blocker.  3. Hypothyroidism. Continue Synthroid. TSH stable.  4. Hypertension. Allow for permissive hypertension. Beta blocker for A. fib rate control, as needed hydralazine. Stop  Norvasc for now.  5.Acute delirium. Haldol as needed and avoid benzodiazepines and narcotics.    Family Communication  :  Son  Code Status :  Full  Diet : Diet regular Room service appropriate? Yes; Fluid consistency: Thin   Disposition Plan  :  SNF  Consults  :  Neuro  Procedures  :    MRI brain and farming  DVT Prophylaxis  :  Eliquis  Lab Results  Component Value Date  PLT 136 (L) 05/21/2016    Inpatient Medications  Scheduled Meds: .  stroke: mapping our early stages of recovery book   Does not apply Once  . amLODipine  2.5 mg Oral Daily  . apixaban  5 mg Oral BID  . levothyroxine  25 mcg Oral QAC breakfast  . loratadine  10 mg Oral Daily  . metoprolol succinate  25 mg Oral QPM   Continuous Infusions: PRN Meds:.acetaminophen **OR** acetaminophen (TYLENOL) oral liquid 160 mg/5 mL **OR** acetaminophen, acetaminophen, polyethylene glycol  Antibiotics  :    Anti-infectives    None         Objective:   Vitals:   05/21/16 2119 05/21/16 2130 05/21/16 2220 05/22/16 0539  BP:  167/91 (!) 180/83 (!) 147/71  Pulse:   75 73  Resp:  22 18 18   Temp:   97.6 F (36.4 C) 97.4 F (36.3 C)  TempSrc:   Oral Axillary  SpO2: 97%  97% 99%  Weight:   70.6 kg (155 lb 10.3 oz)   Height:   5\' 8"  (1.727 m)     Wt Readings from Last 3 Encounters:  05/21/16 70.6 kg (155 lb 10.3 oz)  05/11/16 69.9 kg (154 lb)  03/09/16 72.3 kg (159 lb 6 oz)     Intake/Output Summary (Last 24 hours) at 05/22/16 1120 Last data filed at 05/22/16 1004  Gross per 24 hour  Intake           993.33 ml  Output              325 ml  Net           668.33 ml     Physical Exam  Awake but confused,L sided mildly weaker than the right, mild L facial droop,   [place].AT,PERRAL Supple Neck,No JVD, No cervical lymphadenopathy appriciated.  Symmetrical Chest wall movement, Good air movement bilaterally, CTAB RRR,No Gallops,Rubs or new Murmurs, No Parasternal Heave +ve B.Sounds, Abd Soft, No  tenderness, No organomegaly appriciated, No rebound - guarding or rigidity. No Cyanosis, Clubbing or edema, No new Rash or bruise       Data Review:    CBC  Recent Labs Lab 05/21/16 1726 05/21/16 1751  WBC 3.8*  --   HGB 13.2 13.9  HCT 39.6 41.0  PLT 136*  --   MCV 93.4  --   MCH 31.1  --   MCHC 33.3  --   RDW 14.5  --   LYMPHSABS 1.0  --   MONOABS 0.3  --   EOSABS 0.1  --   BASOSABS 0.0  --     Chemistries   Recent Labs Lab 05/21/16 1726 05/21/16 1751  NA 135 136  K 3.8 3.8  CL 102 106  CO2 27  --   GLUCOSE 111* 105*  BUN 19 19  CREATININE 1.04 1.00  CALCIUM 9.0  --   AST 16  --   ALT 11*  --   ALKPHOS 58  --   BILITOT 1.3*  --    ------------------------------------------------------------------------------------------------------------------ No results for input(s): CHOL, HDL, LDLCALC, TRIG, CHOLHDL, LDLDIRECT in the last 72 hours.  No results found for: HGBA1C ------------------------------------------------------------------------------------------------------------------  Recent Labs  05/21/16 1726  TSH 2.570   ------------------------------------------------------------------------------------------------------------------ No results for input(s): VITAMINB12, FOLATE, FERRITIN, TIBC, IRON, RETICCTPCT in the last 72 hours.  Coagulation profile  Recent Labs Lab 05/21/16 1726  INR 1.19    No results for input(s): DDIMER in the last 72 hours.  Cardiac  Enzymes No results for input(s): CKMB, TROPONINI, MYOGLOBIN in the last 168 hours.  Invalid input(s): CK ------------------------------------------------------------------------------------------------------------------ No results found for: BNP  Micro Results No results found for this or any previous visit (from the past 240 hour(s)).  Radiology Reports Ct Angio Head W Or Wo Contrast  Result Date: 05/21/2016 CLINICAL DATA:  Stroke.  New left-sided weakness. EXAM: CT ANGIOGRAPHY HEAD  AND NECK TECHNIQUE: Multidetector CT imaging of the head and neck was performed using the standard protocol during bolus administration of intravenous contrast. Multiplanar CT image reconstructions and MIPs were obtained to evaluate the vascular anatomy. Carotid stenosis measurements (when applicable) are obtained utilizing NASCET criteria, using the distal internal carotid diameter as the denominator. CONTRAST:  75 mL Isovue 370 IV COMPARISON:  CT head 05/21/2016 FINDINGS: CTA NECK FINDINGS Aortic arch: Atherosclerotic calcification in the aortic arch. Bovine branching pattern. Atherosclerotic calcification proximal great vessels without significant stenosis. There is extensive venous contrast on the study due to late timing. In addition, there is reflux of dense contrast up the left jugular vein from a left arm injection due to left innominate vein stenosis. Right carotid system: Right common carotid artery widely patent with mild atherosclerotic calcification. Atherosclerotic calcification right carotid bifurcation. No significant internal carotid artery stenosis. Mild stenosis external carotid artery due to calcific plaque. Left carotid system: Left common carotid artery widely patent. Atherosclerotic calcification left carotid bifurcation without significant carotid stenosis. Vertebral arteries: Both vertebral arteries are patent to the basilar without significant stenosis. Skeleton: Mild degenerative changes in the cervical spine. No acute skeletal abnormality. Other neck: Negative for mass or adenopathy Upper chest: Bilateral pleural effusions right greater than left. Lung apices clear. Review of the MIP images confirms the above findings CTA HEAD FINDINGS Anterior circulation: Extensive atherosclerotic calcification in the cavernous carotid bilaterally causing mild stenosis. Anterior middle cerebral arteries patent bilaterally. Mild atherosclerotic irregularity in the middle cerebral artery branches  bilaterally. Posterior circulation: Both vertebral arteries patent to the basilar with ectasia due to atherosclerotic disease. The basilar is tortuous and ectatic. No significant basilar stenosis. PICA patent bilaterally. Superior cerebellar and posterior cerebral arteries patent bilaterally without stenosis. Venous sinuses: Patent Anatomic variants: None Delayed phase: Normal enhancement on delayed imaging. No enhancing mass lesion. Generalized atrophy with chronic microvascular ischemia. Review of the MIP images confirms the above findings IMPRESSION: Atherosclerotic calcification in the carotid bifurcation bilaterally without significant carotid stenosis in the neck. Atherosclerotic calcification and mild stenosis in the cavernous carotid bilaterally. No significant vertebral artery stenosis. Atherosclerotic irregularity in middle cerebral artery branches bilaterally without significant intracranial stenosis. Diffusely ectatic and tortuous intracranial circulation likely due to chronic hypertension. Bilateral pleural effusions right greater than left. Electronically Signed   By: Franchot Gallo M.D.   On: 05/21/2016 19:06   Ct Head Wo Contrast  Result Date: 05/21/2016 CLINICAL DATA:  New left-sided weakness, code stroke EXAM: CT HEAD WITHOUT CONTRAST TECHNIQUE: Contiguous axial images were obtained from the base of the skull through the vertex without intravenous contrast. COMPARISON:  11/20/2015 FINDINGS: Brain: No intracranial hemorrhage, mass effect or midline shift. No definite acute cortical infarction. Stable atrophy and chronic small vessel ischemic changes. No mass lesion is noted on this unenhanced scan. Ventricular size is stable from prior exam. Vascular: Atherosclerotic calcifications of carotid siphon again noted. Atherosclerotic calcifications of bilateral vertebral arteries. Skull: No skull fracture is noted. Sinuses/Orbits: No acute findings. Other: None IMPRESSION: No acute intracranial  abnormality. Stable atrophy and chronic white matter disease. No definite acute cortical  infarction. These results were called by telephone at the time of interpretation on 05/21/2016 at 5:40 pm to Dr. Orlie Dakin , who verbally acknowledged these results. Electronically Signed   By: Lahoma Crocker M.D.   On: 05/21/2016 17:40   Ct Angio Neck W And/or Wo Contrast  Result Date: 05/21/2016 CLINICAL DATA:  Stroke.  New left-sided weakness. EXAM: CT ANGIOGRAPHY HEAD AND NECK TECHNIQUE: Multidetector CT imaging of the head and neck was performed using the standard protocol during bolus administration of intravenous contrast. Multiplanar CT image reconstructions and MIPs were obtained to evaluate the vascular anatomy. Carotid stenosis measurements (when applicable) are obtained utilizing NASCET criteria, using the distal internal carotid diameter as the denominator. CONTRAST:  75 mL Isovue 370 IV COMPARISON:  CT head 05/21/2016 FINDINGS: CTA NECK FINDINGS Aortic arch: Atherosclerotic calcification in the aortic arch. Bovine branching pattern. Atherosclerotic calcification proximal great vessels without significant stenosis. There is extensive venous contrast on the study due to late timing. In addition, there is reflux of dense contrast up the left jugular vein from a left arm injection due to left innominate vein stenosis. Right carotid system: Right common carotid artery widely patent with mild atherosclerotic calcification. Atherosclerotic calcification right carotid bifurcation. No significant internal carotid artery stenosis. Mild stenosis external carotid artery due to calcific plaque. Left carotid system: Left common carotid artery widely patent. Atherosclerotic calcification left carotid bifurcation without significant carotid stenosis. Vertebral arteries: Both vertebral arteries are patent to the basilar without significant stenosis. Skeleton: Mild degenerative changes in the cervical spine. No acute skeletal  abnormality. Other neck: Negative for mass or adenopathy Upper chest: Bilateral pleural effusions right greater than left. Lung apices clear. Review of the MIP images confirms the above findings CTA HEAD FINDINGS Anterior circulation: Extensive atherosclerotic calcification in the cavernous carotid bilaterally causing mild stenosis. Anterior middle cerebral arteries patent bilaterally. Mild atherosclerotic irregularity in the middle cerebral artery branches bilaterally. Posterior circulation: Both vertebral arteries patent to the basilar with ectasia due to atherosclerotic disease. The basilar is tortuous and ectatic. No significant basilar stenosis. PICA patent bilaterally. Superior cerebellar and posterior cerebral arteries patent bilaterally without stenosis. Venous sinuses: Patent Anatomic variants: None Delayed phase: Normal enhancement on delayed imaging. No enhancing mass lesion. Generalized atrophy with chronic microvascular ischemia. Review of the MIP images confirms the above findings IMPRESSION: Atherosclerotic calcification in the carotid bifurcation bilaterally without significant carotid stenosis in the neck. Atherosclerotic calcification and mild stenosis in the cavernous carotid bilaterally. No significant vertebral artery stenosis. Atherosclerotic irregularity in middle cerebral artery branches bilaterally without significant intracranial stenosis. Diffusely ectatic and tortuous intracranial circulation likely due to chronic hypertension. Bilateral pleural effusions right greater than left. Electronically Signed   By: Franchot Gallo M.D.   On: 05/21/2016 19:06   Mr Jodene Nam Head Wo Contrast  Result Date: 05/22/2016 CLINICAL DATA:  Left leg weakness. EXAM: MRI HEAD WITHOUT CONTRAST MRA HEAD WITHOUT CONTRAST TECHNIQUE: Multiplanar, multiecho pulse sequences of the brain and surrounding structures were obtained without intravenous contrast. Angiographic images of the head were obtained using MRA  technique without contrast. COMPARISON:  Head and neck CTA from yesterday FINDINGS: MRI HEAD FINDINGS Brain: Confluent area of restricted diffusion in the low genu of the internal capsule on the right. This area measures up to 19 mm and has a wedge-shaped on coronal acquisition. Punctate acute infarct at the medial edge of the left central sulcus. Apparent areas of DWI hyperintensity in the posterior left centrum semiovale are likely related to artifact from  neighboring mineralization. No acute hemorrhage. Chronic blood products in the left cerebral white matter, bilateral cerebellum, right thalamus, and bilateral posterior cerebral cortex. The overall pattern suggests hypertensive hemorrhages. Chronic small-vessel disease with ischemic gliosis confluent around the lateral ventricles and patchy throughout the pons. No mass or hydrocephalus. Vascular: Arterial findings below. Some dephasing in the dural venous sinuses, which were patent on CTA yesterday. Skull and upper cervical spine: Negative Sinuses/Orbits: Negative Other: Motion degraded exam which could obscure or cingulate pathology. MRA HEAD FINDINGS Limited due to motion. Poor signal in the left vertebral artery which is likely artifactual given appearance yesterday. Symmetric carotid arteries without stenosis. Poor signal in the bilateral proximal MCA distribution attributed to dephasing. Very poor visualization of bilateral posterior cerebral arteries due to direction of flow. Moderate left P1 segment stenosis is noted. Intracranial tortuosity and arteriomegaly likely from chronic hypertension. Negative for aneurysm. IMPRESSION: 1. 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 2. Punctate acute infarct at the median left central sulcus. 3. Chronic microvascular disease with remote micro hemorrhages. 4. Limited MRA due to artifact. No new or acute finding when compared to CTA from yesterday. Electronically Signed   By: Monte Fantasia  M.D.   On: 05/22/2016 09:26   Mr Brain Wo Contrast  Result Date: 05/22/2016 CLINICAL DATA:  Left leg weakness. EXAM: MRI HEAD WITHOUT CONTRAST MRA HEAD WITHOUT CONTRAST TECHNIQUE: Multiplanar, multiecho pulse sequences of the brain and surrounding structures were obtained without intravenous contrast. Angiographic images of the head were obtained using MRA technique without contrast. COMPARISON:  Head and neck CTA from yesterday FINDINGS: MRI HEAD FINDINGS Brain: Confluent area of restricted diffusion in the low genu of the internal capsule on the right. This area measures up to 19 mm and has a wedge-shaped on coronal acquisition. Punctate acute infarct at the medial edge of the left central sulcus. Apparent areas of DWI hyperintensity in the posterior left centrum semiovale are likely related to artifact from neighboring mineralization. No acute hemorrhage. Chronic blood products in the left cerebral white matter, bilateral cerebellum, right thalamus, and bilateral posterior cerebral cortex. The overall pattern suggests hypertensive hemorrhages. Chronic small-vessel disease with ischemic gliosis confluent around the lateral ventricles and patchy throughout the pons. No mass or hydrocephalus. Vascular: Arterial findings below. Some dephasing in the dural venous sinuses, which were patent on CTA yesterday. Skull and upper cervical spine: Negative Sinuses/Orbits: Negative Other: Motion degraded exam which could obscure or cingulate pathology. MRA HEAD FINDINGS Limited due to motion. Poor signal in the left vertebral artery which is likely artifactual given appearance yesterday. Symmetric carotid arteries without stenosis. Poor signal in the bilateral proximal MCA distribution attributed to dephasing. Very poor visualization of bilateral posterior cerebral arteries due to direction of flow. Moderate left P1 segment stenosis is noted. Intracranial tortuosity and arteriomegaly likely from chronic hypertension.  Negative for aneurysm. IMPRESSION: 1. 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 2. Punctate acute infarct at the median left central sulcus. 3. Chronic microvascular disease with remote micro hemorrhages. 4. Limited MRA due to artifact. No new or acute finding when compared to CTA from yesterday. Electronically Signed   By: Monte Fantasia M.D.   On: 05/22/2016 09:26    Time Spent in minutes  30   Miri Jose K M.D on 05/22/2016 at 11:20 AM  Between 7am to 7pm - Pager - [phone]  After 7pm go to [email or web address] - password [facility]  [facility] -  Office  [phone]

## 2016-05-22 NOTE — Telephone Encounter (Signed)
[name] wanted to let Dr. Nicki Reaper know that [name] is currently in the hospital due to a TIA.

## 2016-05-22 NOTE — Telephone Encounter (Signed)
Discussed with pt's daughter

## 2016-05-23 DIAGNOSIS — I639 Cerebral infarction, unspecified: Principal | ICD-10-CM

## 2016-05-23 MED ORDER — ASPIRIN 81 MG PO CHEW
81.0000 mg | CHEWABLE_TABLET | Freq: Every day | ORAL | Status: DC
  Administered 2016-05-23 – 2016-05-25 (×3): 81 mg via ORAL
  Filled 2016-05-23 (×3): qty 1

## 2016-05-23 NOTE — Progress Notes (Signed)
Physical Therapy Treatment Patient Details Name: Ethan Faulkner MRN: [id number] DOB: October 23, 1915 Today's Date: 05/23/2016    History of Present Illness Ethan Faulkner is a 82 y.o. male with medical history significant for hypertension, hypothyroidism, chronic atrial fibrillation on a look was, and chronic leukopenia and thrombocytopenia who presents to the emergency department with weakness involving the left arm, left leg, and left face, with mild confusion. Patient reportedly had trouble sleeping last night, but had otherwise been in his usual state of health. He lives alone, but had family members with him this afternoon when he was noted at approximately 2:15 PM to have a left facial droop and weakness involving the left arm and leg. He was speaking fluently, but seemed to be a little bit confused. He had trouble ambulating due to his apparent left leg weakness. Symptoms persisted and EMS was activated this evening. En route to the hospital, EMS reports improvement in his weakness. There has been no recent illness reported, no fevers or chills, and no vomiting or diarrhea. There has been no recent fall or head trauma. No recent change in medications. Patient does not use illicit substances or alcohol.    PT Comments    Patient received sitting upright in chair, pleasant and willing to participate in skilled PT services. Performed functional exercising in sitting and standing today, noting ankle stiffness as well as ankle plantar flexor weakness at this time. Patient did require Min(A) for sit to stand from chair today, did appear unsteady initially and required Min guard-Min(A) to recover balance. Ambulated approximately 4f with RW and min guard today, noting general improvement in mobility, no freezing episodes occurred during today's session. Patient left sitting upright in chair with his son present, all needs otherwise met. Patient appeared to do much better today as compared to his skilled  evaluation yesterday, reports he is feeling better as well.    Follow Up Recommendations  SNF     Equipment Recommendations  None recommended by PT    Recommendations for Other Services       Precautions / Restrictions Precautions Precautions: None Restrictions Weight Bearing Restrictions: No    Mobility  Bed Mobility               General bed mobility comments: DNT, patient received in chair   Transfers Overall transfer level: Needs assistance Equipment used: Rolling walker (2 wheeled) Transfers: Sit to/from Stand Sit to Stand: Min assist            Ambulation/Gait Ambulation/Gait assistance: Min guard Ambulation Distance (Feet): 45 Feet Assistive device: Rolling walker (2 wheeled) Gait Pattern/deviations: Trunk flexed;Decreased step length - left;Decreased step length - right   Gait velocity interpretation: Below normal speed for age/gender General Gait Details: no freezing noted during gait today,min guard provided for safety    Stairs            Wheelchair Mobility    Modified Rankin (Stroke Patients Only)       Balance Overall balance assessment: Needs assistance Sitting-balance support: Bilateral upper extremity supported Sitting balance-Leahy Scale: Good     Standing balance support: Bilateral upper extremity supported Standing balance-Leahy Scale: Fair                      Cognition Arousal/Alertness: Awake/alert Behavior During Therapy: WFL for tasks assessed/performed Overall Cognitive Status: Within Functional Limits for tasks assessed  Exercises General Exercises - Lower Extremity Long Arc Quad: Both;10 reps Hip ABduction/ADduction: Both;10 reps;Other (comment) (seated ) Hip Flexion/Marching: Both;10 reps Heel Raises: Both;10 reps;Other (comment) (standing, ankle stiffness/PF weakness noted )    General Comments        Pertinent Vitals/Pain Pain Assessment: No/denies pain     Home Living                      Prior Function            PT Goals (current goals can now be found in the care plan section) Acute Rehab PT Goals PT Goal Formulation: With patient/family Time For Goal Achievement: 05/26/16 Potential to Achieve Goals: Good Progress towards PT goals: Progressing toward goals    Frequency    Min 3X/week      PT Plan Frequency needs to be updated    Co-evaluation             End of Session Equipment Utilized During Treatment: Gait belt Activity Tolerance: Patient tolerated treatment well Patient left: in chair;with call bell/phone within reach;with family/visitor present     Time: [id number] PT Time Calculation (min) (ACUTE ONLY): 13 min  Charges:  $Therapeutic Exercise: 8-22 mins                    G Codes:      [name] PT, DPT [phone]

## 2016-05-23 NOTE — Progress Notes (Signed)
PT will not leave telemetry leads attached to his chest. PT states he does not like them. Continuously checking and reapplying leads. Called and notified [employer]. Continue to monitor

## 2016-05-23 NOTE — Progress Notes (Signed)
PROGRESS NOTE                                                                                                                                                                                                             Patient Demographics:    Ethan Faulkner, is a 82 y.o. male, DOB - 25-Jul-1915, BF:[id number]  Admit date - 05/21/2016   Admitting Physician Vianne Bulls, MD  Outpatient Primary MD for the patient is Sallee Lange, MD  LOS - 1  Chief Complaint  Patient presents with  . Code Stroke       Brief Narrative  Ethan Faulkner is a 82 y.o. male with medical history significant for hypertension, hypothyroidism, chronic atrial fibrillation on a look was, and chronic leukopenia and thrombocytopenia who presents to the emergency department with weakness involving the left arm, left leg, and left face, with mild confusion.  Other workup suggested bilateral cerebral infarcts along with delirium.   Subjective:    [name] today has, No headache, No chest pain, No abdominal pain - No Nausea, No new weakness tingling or numbness, No Cough - SOB. Unreliable historian.   Assessment  & Plan :    1. 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 Continue supportive care. Likely SNF placement.  2. Chronic atrial fibrillation [place] vasc 2 score of now 4. Continue Eliquis and beta blocker.  3. Hypothyroidism. Continue Synthroid. TSH stable.  4. Hypertension. Allow for permissive hypertension. Beta blocker for A. fib rate control, as needed hydralazine. Stop Norvasc for now.  5.Acute delirium. Haldol as needed and avoid benzodiazepines and  narcotics.    Family Communication  :  Sons  Code Status :  Full  Diet : DIET DYS 3 Room service appropriate? 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  DVT Prophylaxis  :  Eliquis  Lab Results  Component Value Date   PLT 136 (L) 05/21/2016    Inpatient Medications  Scheduled Meds: . apixaban  5 mg Oral BID  . aspirin  81 mg Oral Daily  . levothyroxine  25 mcg Oral QAC breakfast  . loratadine  10 mg Oral Daily  . metoprolol succinate  25 mg Oral QPM   Continuous Infusions: PRN Meds:.acetaminophen **OR** acetaminophen (TYLENOL) oral liquid 160 mg/5 mL **OR** acetaminophen, acetaminophen, haloperidol lactate, hydrALAZINE, polyethylene glycol  Antibiotics  :    Anti-infectives    None         Objective:   Vitals:   05/22/16 0539 05/22/16 1500 05/22/16 2037 05/23/16 0528  BP: (!) 147/71 (!) 155/86 (!) 169/69 (!) 153/71  Pulse: 73 61 69 65  Resp: 18 18 18 18   Temp: 97.4 F (36.3 C) 97.5 F (36.4 C) 97.5 F (36.4 C) 97.4 F (36.3 C)  TempSrc: Axillary Axillary Axillary Axillary  SpO2: 99% 96% 100% 99%  Weight:      Height:        Wt Readings from Last 3 Encounters:  05/21/16 70.6 kg (155 lb 10.3 oz)  05/11/16 69.9 kg (154 lb)  03/09/16 72.3 kg (159 lb 6 oz)     Intake/Output Summary (Last 24 hours) at 05/23/16 0959 Last data filed at 05/22/16 1856  Gross per 24 hour  Intake           805.33 ml  Output                0 ml  Net           805.33 ml     Physical Exam  Awake but confused,L side mildly weaker than the right, mild L facial droop,   Tyler.AT,PERRAL Supple Neck,No JVD, No cervical lymphadenopathy appriciated.  Symmetrical Chest wall movement, Good air movement bilaterally, CTAB RRR,No Gallops,Rubs or new Murmurs, No Parasternal Heave +ve B.Sounds, Abd  Soft, No tenderness, No organomegaly appriciated, No rebound - guarding or rigidity. No Cyanosis, Clubbing or edema, No new Rash or bruise       Data Review:    CBC  Recent Labs Lab 05/21/16 1726 05/21/16 1751  WBC 3.8*  --   HGB 13.2 13.9  HCT 39.6 41.0  PLT 136*  --   MCV 93.4  --   MCH 31.1  --   MCHC 33.3  --   RDW 14.5  --   LYMPHSABS 1.0  --   MONOABS 0.3  --   EOSABS 0.1  --   BASOSABS 0.0  --     Chemistries   Recent Labs Lab 05/21/16 1726 05/21/16 1751  NA 135 136  K 3.8 3.8  CL 102 106  CO2 27  --   GLUCOSE 111* 105*  BUN 19 19  CREATININE 1.04 1.00  CALCIUM 9.0  --   AST 16  --   ALT 11*  --   ALKPHOS 58  --   BILITOT 1.3*  --    ------------------------------------------------------------------------------------------------------------------ No results for input(s): CHOL, HDL, LDLCALC, TRIG, CHOLHDL, LDLDIRECT in the last 72 hours.  No results found for: HGBA1C ------------------------------------------------------------------------------------------------------------------  Recent Labs  05/21/16 1726  TSH 2.570   ------------------------------------------------------------------------------------------------------------------ No results for input(s): VITAMINB12, FOLATE, FERRITIN, TIBC, IRON, RETICCTPCT in the last 72 hours.  Coagulation profile  Recent Labs Lab 05/21/16 1726  INR 1.19    No results for input(s): DDIMER in the last 72 hours.  Cardiac Enzymes No results for  input(s): CKMB, TROPONINI, MYOGLOBIN in the last 168 hours.  Invalid input(s): CK ------------------------------------------------------------------------------------------------------------------ No results found for: BNP  Micro Results No results found for this or any previous visit (from the past 240 hour(s)).  Radiology Reports Ct Angio Head W Or Wo Contrast  Result Date: 05/21/2016 CLINICAL DATA:  Stroke.  New left-sided weakness. EXAM: CT  ANGIOGRAPHY HEAD AND NECK TECHNIQUE: Multidetector CT imaging of the head and neck was performed using the standard protocol during bolus administration of intravenous contrast. Multiplanar CT image reconstructions and MIPs were obtained to evaluate the vascular anatomy. Carotid stenosis measurements (when applicable) are obtained utilizing NASCET criteria, using the distal internal carotid diameter as the denominator. CONTRAST:  75 mL Isovue 370 IV COMPARISON:  CT head 05/21/2016 FINDINGS: CTA NECK FINDINGS Aortic arch: Atherosclerotic calcification in the aortic arch. Bovine branching pattern. Atherosclerotic calcification proximal great vessels without significant stenosis. There is extensive venous contrast on the study due to late timing. In addition, there is reflux of dense contrast up the left jugular vein from a left arm injection due to left innominate vein stenosis. Right carotid system: Right common carotid artery widely patent with mild atherosclerotic calcification. Atherosclerotic calcification right carotid bifurcation. No significant internal carotid artery stenosis. Mild stenosis external carotid artery due to calcific plaque. Left carotid system: Left common carotid artery widely patent. Atherosclerotic calcification left carotid bifurcation without significant carotid stenosis. Vertebral arteries: Both vertebral arteries are patent to the basilar without significant stenosis. Skeleton: Mild degenerative changes in the cervical spine. No acute skeletal abnormality. Other neck: Negative for mass or adenopathy Upper chest: Bilateral pleural effusions right greater than left. Lung apices clear. Review of the MIP images confirms the above findings CTA HEAD FINDINGS Anterior circulation: Extensive atherosclerotic calcification in the cavernous carotid bilaterally causing mild stenosis. Anterior middle cerebral arteries patent bilaterally. Mild atherosclerotic irregularity in the middle cerebral artery  branches bilaterally. Posterior circulation: Both vertebral arteries patent to the basilar with ectasia due to atherosclerotic disease. The basilar is tortuous and ectatic. No significant basilar stenosis. PICA patent bilaterally. Superior cerebellar and posterior cerebral arteries patent bilaterally without stenosis. Venous sinuses: Patent Anatomic variants: None Delayed phase: Normal enhancement on delayed imaging. No enhancing mass lesion. Generalized atrophy with chronic microvascular ischemia. Review of the MIP images confirms the above findings IMPRESSION: Atherosclerotic calcification in the carotid bifurcation bilaterally without significant carotid stenosis in the neck. Atherosclerotic calcification and mild stenosis in the cavernous carotid bilaterally. No significant vertebral artery stenosis. Atherosclerotic irregularity in middle cerebral artery branches bilaterally without significant intracranial stenosis. Diffusely ectatic and tortuous intracranial circulation likely due to chronic hypertension. Bilateral pleural effusions right greater than left. Electronically Signed   By: Franchot Gallo M.D.   On: 05/21/2016 19:06   Ct Head Wo Contrast  Result Date: 05/21/2016 CLINICAL DATA:  New left-sided weakness, code stroke EXAM: CT HEAD WITHOUT CONTRAST TECHNIQUE: Contiguous axial images were obtained from the base of the skull through the vertex without intravenous contrast. COMPARISON:  11/20/2015 FINDINGS: Brain: No intracranial hemorrhage, mass effect or midline shift. No definite acute cortical infarction. Stable atrophy and chronic small vessel ischemic changes. No mass lesion is noted on this unenhanced scan. Ventricular size is stable from prior exam. Vascular: Atherosclerotic calcifications of carotid siphon again noted. Atherosclerotic calcifications of bilateral vertebral arteries. Skull: No skull fracture is noted. Sinuses/Orbits: No acute findings. Other: None IMPRESSION: No acute  intracranial abnormality. Stable atrophy and chronic white matter disease. No definite acute cortical infarction. These results were  called by telephone at the time of interpretation on 05/21/2016 at 5:40 pm to Dr. Orlie Dakin , who verbally acknowledged these results. Electronically Signed   By: Lahoma Crocker M.D.   On: 05/21/2016 17:40   Ct Angio Neck W And/or Wo Contrast  Result Date: 05/21/2016 CLINICAL DATA:  Stroke.  New left-sided weakness. EXAM: CT ANGIOGRAPHY HEAD AND NECK TECHNIQUE: Multidetector CT imaging of the head and neck was performed using the standard protocol during bolus administration of intravenous contrast. Multiplanar CT image reconstructions and MIPs were obtained to evaluate the vascular anatomy. Carotid stenosis measurements (when applicable) are obtained utilizing NASCET criteria, using the distal internal carotid diameter as the denominator. CONTRAST:  75 mL Isovue 370 IV COMPARISON:  CT head 05/21/2016 FINDINGS: CTA NECK FINDINGS Aortic arch: Atherosclerotic calcification in the aortic arch. Bovine branching pattern. Atherosclerotic calcification proximal great vessels without significant stenosis. There is extensive venous contrast on the study due to late timing. In addition, there is reflux of dense contrast up the left jugular vein from a left arm injection due to left innominate vein stenosis. Right carotid system: Right common carotid artery widely patent with mild atherosclerotic calcification. Atherosclerotic calcification right carotid bifurcation. No significant internal carotid artery stenosis. Mild stenosis external carotid artery due to calcific plaque. Left carotid system: Left common carotid artery widely patent. Atherosclerotic calcification left carotid bifurcation without significant carotid stenosis. Vertebral arteries: Both vertebral arteries are patent to the basilar without significant stenosis. Skeleton: Mild degenerative changes in the cervical spine. No  acute skeletal abnormality. Other neck: Negative for mass or adenopathy Upper chest: Bilateral pleural effusions right greater than left. Lung apices clear. Review of the MIP images confirms the above findings CTA HEAD FINDINGS Anterior circulation: Extensive atherosclerotic calcification in the cavernous carotid bilaterally causing mild stenosis. Anterior middle cerebral arteries patent bilaterally. Mild atherosclerotic irregularity in the middle cerebral artery branches bilaterally. Posterior circulation: Both vertebral arteries patent to the basilar with ectasia due to atherosclerotic disease. The basilar is tortuous and ectatic. No significant basilar stenosis. PICA patent bilaterally. Superior cerebellar and posterior cerebral arteries patent bilaterally without stenosis. Venous sinuses: Patent Anatomic variants: None Delayed phase: Normal enhancement on delayed imaging. No enhancing mass lesion. Generalized atrophy with chronic microvascular ischemia. Review of the MIP images confirms the above findings IMPRESSION: Atherosclerotic calcification in the carotid bifurcation bilaterally without significant carotid stenosis in the neck. Atherosclerotic calcification and mild stenosis in the cavernous carotid bilaterally. No significant vertebral artery stenosis. Atherosclerotic irregularity in middle cerebral artery branches bilaterally without significant intracranial stenosis. Diffusely ectatic and tortuous intracranial circulation likely due to chronic hypertension. Bilateral pleural effusions right greater than left. Electronically Signed   By: Franchot Gallo M.D.   On: 05/21/2016 19:06   Mr Jodene Nam Head Wo Contrast  Result Date: 05/22/2016 CLINICAL DATA:  Left leg weakness. EXAM: MRI HEAD WITHOUT CONTRAST MRA HEAD WITHOUT CONTRAST TECHNIQUE: Multiplanar, multiecho pulse sequences of the brain and surrounding structures were obtained without intravenous contrast. Angiographic images of the head were obtained  using MRA technique without contrast. COMPARISON:  Head and neck CTA from yesterday FINDINGS: MRI HEAD FINDINGS Brain: Confluent area of restricted diffusion in the low genu of the internal capsule on the right. This area measures up to 19 mm and has a wedge-shaped on coronal acquisition. Punctate acute infarct at the medial edge of the left central sulcus. Apparent areas of DWI hyperintensity in the posterior left centrum semiovale are likely related to artifact from neighboring mineralization. No acute  hemorrhage. Chronic blood products in the left cerebral white matter, bilateral cerebellum, right thalamus, and bilateral posterior cerebral cortex. The overall pattern suggests hypertensive hemorrhages. Chronic small-vessel disease with ischemic gliosis confluent around the lateral ventricles and patchy throughout the pons. No mass or hydrocephalus. Vascular: Arterial findings below. Some dephasing in the dural venous sinuses, which were patent on CTA yesterday. Skull and upper cervical spine: Negative Sinuses/Orbits: Negative Other: Motion degraded exam which could obscure or cingulate pathology. MRA HEAD FINDINGS Limited due to motion. Poor signal in the left vertebral artery which is likely artifactual given appearance yesterday. Symmetric carotid arteries without stenosis. Poor signal in the bilateral proximal MCA distribution attributed to dephasing. Very poor visualization of bilateral posterior cerebral arteries due to direction of flow. Moderate left P1 segment stenosis is noted. Intracranial tortuosity and arteriomegaly likely from chronic hypertension. Negative for aneurysm. IMPRESSION: 1. 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 2. Punctate acute infarct at the median left central sulcus. 3. Chronic microvascular disease with remote micro hemorrhages. 4. Limited MRA due to artifact. No new or acute finding when compared to CTA from yesterday. Electronically Signed   By: Monte Fantasia M.D.   On: 05/22/2016 09:26   Mr Brain Wo Contrast  Result Date: 05/22/2016 CLINICAL DATA:  Left leg weakness. EXAM: MRI HEAD WITHOUT CONTRAST MRA HEAD WITHOUT CONTRAST TECHNIQUE: Multiplanar, multiecho pulse sequences of the brain and surrounding structures were obtained without intravenous contrast. Angiographic images of the head were obtained using MRA technique without contrast. COMPARISON:  Head and neck CTA from yesterday FINDINGS: MRI HEAD FINDINGS Brain: Confluent area of restricted diffusion in the low genu of the internal capsule on the right. This area measures up to 19 mm and has a wedge-shaped on coronal acquisition. Punctate acute infarct at the medial edge of the left central sulcus. Apparent areas of DWI hyperintensity in the posterior left centrum semiovale are likely related to artifact from neighboring mineralization. No acute hemorrhage. Chronic blood products in the left cerebral white matter, bilateral cerebellum, right thalamus, and bilateral posterior cerebral cortex. The overall pattern suggests hypertensive hemorrhages. Chronic small-vessel disease with ischemic gliosis confluent around the lateral ventricles and patchy throughout the pons. No mass or hydrocephalus. Vascular: Arterial findings below. Some dephasing in the dural venous sinuses, which were patent on CTA yesterday. Skull and upper cervical spine: Negative Sinuses/Orbits: Negative Other: Motion degraded exam which could obscure or cingulate pathology. MRA HEAD FINDINGS Limited due to motion. Poor signal in the left vertebral artery which is likely artifactual given appearance yesterday. Symmetric carotid arteries without stenosis. Poor signal in the bilateral proximal MCA distribution attributed to dephasing. Very poor visualization of bilateral posterior cerebral arteries due to direction of flow. Moderate left P1 segment stenosis is noted. Intracranial tortuosity and arteriomegaly likely from chronic  hypertension. Negative for aneurysm. IMPRESSION: 1. 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 2. Punctate acute infarct at the median left central sulcus. 3. Chronic microvascular disease with remote micro hemorrhages. 4. Limited MRA due to artifact. No new or acute finding when compared to CTA from yesterday. Electronically Signed   By: Monte Fantasia M.D.   On: 05/22/2016 09:26    Time Spent in minutes  30   SINGH,PRASHANT K M.D on 05/23/2016 at 9:59 AM  Between 7am to 7pm - Pager - [phone]  After 7pm go to [email or web address] - password [facility]  [facility] -  Office  [phone]

## 2016-05-24 MED ORDER — AMLODIPINE BESYLATE 5 MG PO TABS
2.5000 mg | ORAL_TABLET | Freq: Every day | ORAL | Status: DC
  Administered 2016-05-24 – 2016-05-25 (×2): 2.5 mg via ORAL
  Filled 2016-05-24 (×3): qty 1

## 2016-05-24 NOTE — Progress Notes (Signed)
PROGRESS NOTE                                                                                                                                                                                                             Patient Demographics:    Ethan Faulkner, is a 82 y.o. male, DOB - 1915/12/21, BF:[id number]  Admit date - 05/21/2016   Admitting Physician Vianne Bulls, MD  Outpatient Primary MD for the patient is Sallee Lange, MD  LOS - 2  Chief Complaint  Patient presents with  . Code Stroke       Brief Narrative  Ethan Faulkner is a 82 y.o. male with medical history significant for hypertension, hypothyroidism, chronic atrial fibrillation on a look was, and chronic leukopenia and thrombocytopenia who presents to the emergency department with weakness involving the left arm, left leg, and left face, with mild confusion.  Other workup suggested bilateral cerebral infarcts along with delirium.   Subjective:    Ethan Faulkner today has, No headache, No chest pain, No abdominal pain - No Nausea, No new weakness tingling or numbness, No Cough - SOB. Unreliable historian.   Assessment  & Plan :    1. 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 Continue supportive care. Likely SNF placement in the morning.  2. Chronic atrial fibrillation [place] vasc 2 score of now 4. Continue Eliquis and beta blocker.  3. Hypothyroidism. Continue Synthroid. TSH stable.  4. Hypertension. Now more than 3 days out of acute event already tolerating home dose better blocker will reintroduce home dose Norvasc.  5.Acute delirium. Haldol as needed and avoid  benzodiazepines and narcotics.    Family Communication  :  Sons  Code Status :  Full  Diet : DIET DYS 3 Room service appropriate? 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  DVT  Prophylaxis  :  Eliquis  Lab Results  Component Value Date   PLT 136 (L) 05/21/2016    Inpatient Medications  Scheduled Meds: . amLODipine  2.5 mg Oral Daily  . apixaban  5 mg Oral BID  . aspirin  81 mg Oral Daily  . levothyroxine  25 mcg Oral QAC breakfast  . loratadine  10 mg Oral Daily  . metoprolol succinate  25 mg Oral QPM   Continuous Infusions: PRN Meds:.acetaminophen **OR** acetaminophen (TYLENOL) oral liquid 160 mg/5 mL **OR** acetaminophen, acetaminophen, haloperidol lactate, hydrALAZINE, polyethylene glycol  Antibiotics  :    Anti-infectives    None         Objective:   Vitals:   05/23/16 0528 05/23/16 1400 05/23/16 2159 05/24/16 0643  BP: (!) 153/71 (!) 167/62 (!) 180/72 (!) 178/69  Pulse: 65 64 69 71  Resp: 18 18 18 18   Temp: 97.4 F (36.3 C) 97.9 F (36.6 C) 97.5 F (36.4 C) 97.4 F (36.3 C)  TempSrc: Axillary Oral Oral Oral  SpO2: 99% 96% 95% 100%  Weight:    69.4 kg (153 lb)  Height:        Wt Readings from Last 3 Encounters:  05/24/16 69.4 kg (153 lb)  05/11/16 69.9 kg (154 lb)  03/09/16 72.3 kg (159 lb 6 oz)    No intake or output data in the 24 hours ending 05/24/16 0909   Physical Exam  Awake but confused,L side mildly weaker than the right, mild L facial droop,   [name].AT,PERRAL Supple Neck,No JVD, No cervical lymphadenopathy appriciated.  Symmetrical Chest wall movement, Good air movement bilaterally, CTAB RRR,No Gallops,Rubs or new Murmurs, No Parasternal Heave +ve B.Sounds, Abd Soft, No tenderness, No organomegaly appriciated, No rebound - guarding or  rigidity. No Cyanosis, Clubbing or edema, No new Rash or bruise       Data Review:    CBC  Recent Labs Lab 05/21/16 1726 05/21/16 1751  WBC 3.8*  --   HGB 13.2 13.9  HCT 39.6 41.0  PLT 136*  --   MCV 93.4  --   MCH 31.1  --   MCHC 33.3  --   RDW 14.5  --   LYMPHSABS 1.0  --   MONOABS 0.3  --   EOSABS 0.1  --   BASOSABS 0.0  --     Chemistries   Recent Labs Lab 05/21/16 1726 05/21/16 1751  NA 135 136  K 3.8 3.8  CL 102 106  CO2 27  --   GLUCOSE 111* 105*  BUN 19 19  CREATININE 1.04 1.00  CALCIUM 9.0  --   AST 16  --   ALT 11*  --   ALKPHOS 58  --   BILITOT 1.3*  --    ------------------------------------------------------------------------------------------------------------------ No results for input(s): CHOL, HDL, LDLCALC, TRIG, CHOLHDL, LDLDIRECT in the last 72 hours.  No results found for: HGBA1C ------------------------------------------------------------------------------------------------------------------  Recent Labs  05/21/16 1726  TSH 2.570   ------------------------------------------------------------------------------------------------------------------ No results for input(s): VITAMINB12, FOLATE, FERRITIN, TIBC, IRON, RETICCTPCT in the last 72 hours.  Coagulation profile  Recent Labs Lab 05/21/16 1726  INR 1.19    No results for input(s): DDIMER in the last 72 hours.  Cardiac Enzymes No results for input(s): CKMB, TROPONINI, MYOGLOBIN in the last 168 hours.  Invalid input(s): CK ------------------------------------------------------------------------------------------------------------------ No results found for: BNP  Micro Results No results found for this or any previous visit (from the past 240 hour(s)).  Radiology Reports Ct Angio Head  W Or Wo Contrast  Result Date: 05/21/2016 CLINICAL DATA:  Stroke.  New left-sided weakness. EXAM: CT ANGIOGRAPHY HEAD AND NECK TECHNIQUE: Multidetector CT imaging of the head and neck  was performed using the standard protocol during bolus administration of intravenous contrast. Multiplanar CT image reconstructions and MIPs were obtained to evaluate the vascular anatomy. Carotid stenosis measurements (when applicable) are obtained utilizing NASCET criteria, using the distal internal carotid diameter as the denominator. CONTRAST:  75 mL Isovue 370 IV COMPARISON:  CT head 05/21/2016 FINDINGS: CTA NECK FINDINGS Aortic arch: Atherosclerotic calcification in the aortic arch. Bovine branching pattern. Atherosclerotic calcification proximal great vessels without significant stenosis. There is extensive venous contrast on the study due to late timing. In addition, there is reflux of dense contrast up the left jugular vein from a left arm injection due to left innominate vein stenosis. Right carotid system: Right common carotid artery widely patent with mild atherosclerotic calcification. Atherosclerotic calcification right carotid bifurcation. No significant internal carotid artery stenosis. Mild stenosis external carotid artery due to calcific plaque. Left carotid system: Left common carotid artery widely patent. Atherosclerotic calcification left carotid bifurcation without significant carotid stenosis. Vertebral arteries: Both vertebral arteries are patent to the basilar without significant stenosis. Skeleton: Mild degenerative changes in the cervical spine. No acute skeletal abnormality. Other neck: Negative for mass or adenopathy Upper chest: Bilateral pleural effusions right greater than left. Lung apices clear. Review of the MIP images confirms the above findings CTA HEAD FINDINGS Anterior circulation: Extensive atherosclerotic calcification in the cavernous carotid bilaterally causing mild stenosis. Anterior middle cerebral arteries patent bilaterally. Mild atherosclerotic irregularity in the middle cerebral artery branches bilaterally. Posterior circulation: Both vertebral arteries patent to the  basilar with ectasia due to atherosclerotic disease. The basilar is tortuous and ectatic. No significant basilar stenosis. PICA patent bilaterally. Superior cerebellar and posterior cerebral arteries patent bilaterally without stenosis. Venous sinuses: Patent Anatomic variants: None Delayed phase: Normal enhancement on delayed imaging. No enhancing mass lesion. Generalized atrophy with chronic microvascular ischemia. Review of the MIP images confirms the above findings IMPRESSION: Atherosclerotic calcification in the carotid bifurcation bilaterally without significant carotid stenosis in the neck. Atherosclerotic calcification and mild stenosis in the cavernous carotid bilaterally. No significant vertebral artery stenosis. Atherosclerotic irregularity in middle cerebral artery branches bilaterally without significant intracranial stenosis. Diffusely ectatic and tortuous intracranial circulation likely due to chronic hypertension. Bilateral pleural effusions right greater than left. Electronically Signed   By: Franchot Gallo M.D.   On: 05/21/2016 19:06   Ct Head Wo Contrast  Result Date: 05/21/2016 CLINICAL DATA:  New left-sided weakness, code stroke EXAM: CT HEAD WITHOUT CONTRAST TECHNIQUE: Contiguous axial images were obtained from the base of the skull through the vertex without intravenous contrast. COMPARISON:  11/20/2015 FINDINGS: Brain: No intracranial hemorrhage, mass effect or midline shift. No definite acute cortical infarction. Stable atrophy and chronic small vessel ischemic changes. No mass lesion is noted on this unenhanced scan. Ventricular size is stable from prior exam. Vascular: Atherosclerotic calcifications of carotid siphon again noted. Atherosclerotic calcifications of bilateral vertebral arteries. Skull: No skull fracture is noted. Sinuses/Orbits: No acute findings. Other: None IMPRESSION: No acute intracranial abnormality. Stable atrophy and chronic white matter disease. No definite acute  cortical infarction. These results were called by telephone at the time of interpretation on 05/21/2016 at 5:40 pm to Dr. Orlie Dakin , who verbally acknowledged these results. Electronically Signed   By: Lahoma Crocker M.D.   On: 05/21/2016 17:40   Ct Angio Neck  W And/or Wo Contrast  Result Date: 05/21/2016 CLINICAL DATA:  Stroke.  New left-sided weakness. EXAM: CT ANGIOGRAPHY HEAD AND NECK TECHNIQUE: Multidetector CT imaging of the head and neck was performed using the standard protocol during bolus administration of intravenous contrast. Multiplanar CT image reconstructions and MIPs were obtained to evaluate the vascular anatomy. Carotid stenosis measurements (when applicable) are obtained utilizing NASCET criteria, using the distal internal carotid diameter as the denominator. CONTRAST:  75 mL Isovue 370 IV COMPARISON:  CT head 05/21/2016 FINDINGS: CTA NECK FINDINGS Aortic arch: Atherosclerotic calcification in the aortic arch. Bovine branching pattern. Atherosclerotic calcification proximal great vessels without significant stenosis. There is extensive venous contrast on the study due to late timing. In addition, there is reflux of dense contrast up the left jugular vein from a left arm injection due to left innominate vein stenosis. Right carotid system: Right common carotid artery widely patent with mild atherosclerotic calcification. Atherosclerotic calcification right carotid bifurcation. No significant internal carotid artery stenosis. Mild stenosis external carotid artery due to calcific plaque. Left carotid system: Left common carotid artery widely patent. Atherosclerotic calcification left carotid bifurcation without significant carotid stenosis. Vertebral arteries: Both vertebral arteries are patent to the basilar without significant stenosis. Skeleton: Mild degenerative changes in the cervical spine. No acute skeletal abnormality. Other neck: Negative for mass or adenopathy Upper chest: Bilateral  pleural effusions right greater than left. Lung apices clear. Review of the MIP images confirms the above findings CTA HEAD FINDINGS Anterior circulation: Extensive atherosclerotic calcification in the cavernous carotid bilaterally causing mild stenosis. Anterior middle cerebral arteries patent bilaterally. Mild atherosclerotic irregularity in the middle cerebral artery branches bilaterally. Posterior circulation: Both vertebral arteries patent to the basilar with ectasia due to atherosclerotic disease. The basilar is tortuous and ectatic. No significant basilar stenosis. PICA patent bilaterally. Superior cerebellar and posterior cerebral arteries patent bilaterally without stenosis. Venous sinuses: Patent Anatomic variants: None Delayed phase: Normal enhancement on delayed imaging. No enhancing mass lesion. Generalized atrophy with chronic microvascular ischemia. Review of the MIP images confirms the above findings IMPRESSION: Atherosclerotic calcification in the carotid bifurcation bilaterally without significant carotid stenosis in the neck. Atherosclerotic calcification and mild stenosis in the cavernous carotid bilaterally. No significant vertebral artery stenosis. Atherosclerotic irregularity in middle cerebral artery branches bilaterally without significant intracranial stenosis. Diffusely ectatic and tortuous intracranial circulation likely due to chronic hypertension. Bilateral pleural effusions right greater than left. Electronically Signed   By: Franchot Gallo M.D.   On: 05/21/2016 19:06   Mr Jodene Nam Head Wo Contrast  Result Date: 05/22/2016 CLINICAL DATA:  Left leg weakness. EXAM: MRI HEAD WITHOUT CONTRAST MRA HEAD WITHOUT CONTRAST TECHNIQUE: Multiplanar, multiecho pulse sequences of the brain and surrounding structures were obtained without intravenous contrast. Angiographic images of the head were obtained using MRA technique without contrast. COMPARISON:  Head and neck CTA from yesterday FINDINGS:  MRI HEAD FINDINGS Brain: Confluent area of restricted diffusion in the low genu of the internal capsule on the right. This area measures up to 19 mm and has a wedge-shaped on coronal acquisition. Punctate acute infarct at the medial edge of the left central sulcus. Apparent areas of DWI hyperintensity in the posterior left centrum semiovale are likely related to artifact from neighboring mineralization. No acute hemorrhage. Chronic blood products in the left cerebral white matter, bilateral cerebellum, right thalamus, and bilateral posterior cerebral cortex. The overall pattern suggests hypertensive hemorrhages. Chronic small-vessel disease with ischemic gliosis confluent around the lateral ventricles and patchy throughout the pons. No  mass or hydrocephalus. Vascular: Arterial findings below. Some dephasing in the dural venous sinuses, which were patent on CTA yesterday. Skull and upper cervical spine: Negative Sinuses/Orbits: Negative Other: Motion degraded exam which could obscure or cingulate pathology. MRA HEAD FINDINGS Limited due to motion. Poor signal in the left vertebral artery which is likely artifactual given appearance yesterday. Symmetric carotid arteries without stenosis. Poor signal in the bilateral proximal MCA distribution attributed to dephasing. Very poor visualization of bilateral posterior cerebral arteries due to direction of flow. Moderate left P1 segment stenosis is noted. Intracranial tortuosity and arteriomegaly likely from chronic hypertension. Negative for aneurysm. IMPRESSION: 1. 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 2. Punctate acute infarct at the median left central sulcus. 3. Chronic microvascular disease with remote micro hemorrhages. 4. Limited MRA due to artifact. No new or acute finding when compared to CTA from yesterday. Electronically Signed   By: Monte Fantasia M.D.   On: 05/22/2016 09:26   Mr Brain Wo Contrast  Result Date:  05/22/2016 CLINICAL DATA:  Left leg weakness. EXAM: MRI HEAD WITHOUT CONTRAST MRA HEAD WITHOUT CONTRAST TECHNIQUE: Multiplanar, multiecho pulse sequences of the brain and surrounding structures were obtained without intravenous contrast. Angiographic images of the head were obtained using MRA technique without contrast. COMPARISON:  Head and neck CTA from yesterday FINDINGS: MRI HEAD FINDINGS Brain: Confluent area of restricted diffusion in the low genu of the internal capsule on the right. This area measures up to 19 mm and has a wedge-shaped on coronal acquisition. Punctate acute infarct at the medial edge of the left central sulcus. Apparent areas of DWI hyperintensity in the posterior left centrum semiovale are likely related to artifact from neighboring mineralization. No acute hemorrhage. Chronic blood products in the left cerebral white matter, bilateral cerebellum, right thalamus, and bilateral posterior cerebral cortex. The overall pattern suggests hypertensive hemorrhages. Chronic small-vessel disease with ischemic gliosis confluent around the lateral ventricles and patchy throughout the pons. No mass or hydrocephalus. Vascular: Arterial findings below. Some dephasing in the dural venous sinuses, which were patent on CTA yesterday. Skull and upper cervical spine: Negative Sinuses/Orbits: Negative Other: Motion degraded exam which could obscure or cingulate pathology. MRA HEAD FINDINGS Limited due to motion. Poor signal in the left vertebral artery which is likely artifactual given appearance yesterday. Symmetric carotid arteries without stenosis. Poor signal in the bilateral proximal MCA distribution attributed to dephasing. Very poor visualization of bilateral posterior cerebral arteries due to direction of flow. Moderate left P1 segment stenosis is noted. Intracranial tortuosity and arteriomegaly likely from chronic hypertension. Negative for aneurysm. IMPRESSION: 1. Acute perforator infarct affecting  the low right internal capsule. 2. Punctate acute infarct at the median left central sulcus. 3. Chronic microvascular disease with remote micro hemorrhages. 4. Limited MRA due to artifact. No new or acute finding when compared to CTA from yesterday. Electronically Signed   By: Monte Fantasia M.D.   On: 05/22/2016 09:26    Time Spent in minutes  30   Rissa Turley K M.D on 05/24/2016 at 9:09 AM  Between 7am to 7pm - Pager - [phone]  After 7pm go to [email or web address] - password [facility]  [facility] -  Office  [phone]

## 2016-05-25 NOTE — Clinical Social Work Note (Addendum)
CSW met with pt's son and spoke with pt's daughter, [name] on phone. Pt has improved with speech and PT and no longer qualifies for SNF. He ambulated 200' with therapy this morning. CSW discussed this with family and that recommendation is now for home health. They are aware that they can pay privately if needed, but have decided to go home with home health. Discussed with speech who will update note later today, but agrees with home health. MD notified. CSW will sign off.  Benay Pike, [place]

## 2016-05-25 NOTE — Clinical Social Work Note (Signed)
Pt's family very frustrated that pt does not meet SNF criteria and feel that pt is confused. CSW discussed 24/7 supervision or that family could consider ALF placement. They requested private duty care list which CM department will provide.   Benay Pike, [place]

## 2016-05-25 NOTE — Discharge Instructions (Signed)
Follow with Primary MD Sallee Lange, MD in 7 days   Get CBC, CMP, 2 view Chest X ray checked  by Primary MD or SNF MD in 5-7 days ( we routinely change or add medications that can affect your baseline labs and fluid status, therefore we recommend that you get the mentioned basic workup next visit with your PCP, your PCP may decide not to get them or add new tests based on their clinical decision)   Activity: As tolerated with Full fall precautions use walker/cane & assistance as needed   Disposition SNF   Diet:   DIET DYS 3 with feeding assistance and aspiration precautions.  For Heart failure patients - Check your Weight same time everyday, if you gain over 2 pounds, or you develop in leg swelling, experience more shortness of breath or chest pain, call your Primary MD immediately. Follow Cardiac Low Salt Diet and 1.5 lit/day fluid restriction.   On your next visit with your primary care physician please Get Medicines reviewed and adjusted.   Please request your Prim.MD to go over all Hospital Tests and Procedure/Radiological results at the follow up, please get all Hospital records sent to your Prim MD by signing hospital release before you go home.   If you experience worsening of your admission symptoms, develop shortness of breath, life threatening emergency, suicidal or homicidal thoughts you must seek medical attention immediately by calling 911 or calling your MD immediately  if symptoms less severe.  You Must read complete instructions/literature along with all the possible adverse reactions/side effects for all the Medicines you take and that have been prescribed to you. Take any new Medicines after you have completely understood and accpet all the possible adverse reactions/side effects.   Do not drive, operate heavy machinery, perform activities at heights, swimming or participation in water activities or provide baby sitting services if your were admitted for syncope or  siezures until you have seen by Primary MD or a Neurologist and advised to do so again.  Do not drive when taking Pain medications.    Do not take more than prescribed Pain, Sleep and Anxiety Medications  Special Instructions: If you have smoked or chewed Tobacco  in the last 2 yrs please stop smoking, stop any regular Alcohol  and or any Recreational drug use.  Wear Seat belts while driving.   Please note  You were cared for by a hospitalist during your hospital stay. If you have any questions about your discharge medications or the care you received while you were in the hospital after you are discharged, you can call the unit and asked to speak with the hospitalist on call if the hospitalist that took care of you is not available. Once you are discharged, your primary care physician will handle any further medical issues. Please note that NO REFILLS for any discharge medications will be authorized once you are discharged, as it is imperative that you return to your primary care physician (or establish a relationship with a primary care physician if you do not have one) for your aftercare needs so that they can reassess your need for medications and monitor your lab values.

## 2016-05-25 NOTE — Progress Notes (Signed)
I worked with this patient on Saturday 12/30 and also personally observed patient ambulating in hall with PTA today. Patient appears to be doing very well with ambulation with walker and does not appear to be in need of SNF placement at this time.   Recommend skilled HHPT services moving forward.   [name] PT, DPT [phone]

## 2016-05-25 NOTE — Progress Notes (Signed)
Patient is to be discharged home and in stable condition. IV removed. Patient will be discharged home with home health. Orders have been sent to [facility]. Patient and family verbalize understanding of discharge instructions and plan. Patient will be escorted out by staff.  Celestia Khat, RN

## 2016-05-25 NOTE — Progress Notes (Signed)
Physical Therapy Treatment Patient Details Name: Ethan Faulkner MRN: [id number] DOB: 13-Feb-1916 Today's Date: 05/25/2016    History of Present Illness Ethan Faulkner is a 83 y.o. male with medical history significant for hypertension, hypothyroidism, chronic atrial fibrillation on a look was, and chronic leukopenia and thrombocytopenia who presents to the emergency department with weakness involving the left arm, left leg, and left face, with mild confusion. Patient reportedly had trouble sleeping last night, but had otherwise been in his usual state of health. He lives alone, but had family members with him this afternoon when he was noted at approximately 2:15 PM to have a left facial droop and weakness involving the left arm and leg. He was speaking fluently, but seemed to be a little bit confused. He had trouble ambulating due to his apparent left leg weakness. Symptoms persisted and EMS was activated this evening. En route to the hospital, EMS reports improvement in his weakness. There has been no recent illness reported, no fevers or chills, and no vomiting or diarrhea. There has been no recent fall or head trauma. No recent change in medications. Patient does not use illicit substances or alcohol.    PT Comments    Pt friendly and willing to participate with therapy today.  No reports of pain currently.  Pt able to complete increased distance with gait training this session, no freezing or LOB episodes noted.  Min cueing to improve posture and to stand closer to walker for safety.  Min cueing for handplacement prior sitting.  Pt left in chair with call bell within reach, chair alarm set and family in room.  No reports of pain through session.    Follow Up Recommendations        Equipment Recommendations       Recommendations for Other Services       Precautions / Restrictions Precautions Precautions: None Restrictions Weight Bearing Restrictions: No    Mobility  Bed  Mobility Overal bed mobility: Modified Independent Bed Mobility: Supine to Sit     Supine to sit: Min assist        Transfers Overall transfer level: Modified independent Equipment used: Hemi-walker Transfers: Sit to/from Stand           General transfer comment: cueing for hand placement for safety  Ambulation/Gait Ambulation/Gait assistance: Min guard Ambulation Distance (Feet): 200 Feet Assistive device: Rolling walker (2 wheeled) Gait Pattern/deviations: Trunk flexed;Decreased step length - left;Decreased step length - right     General Gait Details: safe gait mechanics minimal cueing to improve posture and RW position during gait, no freezing of LOB   Stairs            Wheelchair Mobility    Modified Rankin (Stroke Patients Only)       Balance                                    Cognition Arousal/Alertness: Awake/alert Behavior During Therapy: WFL for tasks assessed/performed Overall Cognitive Status: Within Functional Limits for tasks assessed                      Exercises Total Joint Exercises Ankle Circles/Pumps: 15 reps;Supine Long Arc Quad: Both;10 reps;Seated    General Comments        Pertinent Vitals/Pain Pain Assessment: No/denies pain    Home Living  Prior Function            PT Goals (current goals can now be found in the care plan section) Acute Rehab PT Goals PT Goal Formulation: With patient/family Time For Goal Achievement: 05/26/16 Potential to Achieve Goals: Good Progress towards PT goals: Progressing toward goals    Frequency    Min 3X/week      PT Plan Current plan remains appropriate    Co-evaluation             End of Session Equipment Utilized During Treatment: Gait belt;Oxygen Activity Tolerance: Patient tolerated treatment well Patient left: in chair;with call bell/phone within reach;with family/visitor present     Time: 0850-0905 PT  Time Calculation (min) (ACUTE ONLY): 15 min  Charges:  $Gait Training: 8-22 mins $Therapeutic Activity: 8-22 mins                    G Codes:     Ihor Austin, [place]; [facility] [phone]  Aldona Lento 05/25/2016, 12:15 PM

## 2016-05-25 NOTE — Discharge Summary (Signed)
Ethan Faulkner [id number] DOB: [birth year] DOA: 05/21/2016  PCP: Sallee Lange, MD  Admit date: 05/21/2016  Discharge date: 05/25/2016  Admitted From: Home  Disposition:  SNF   Recommendations for Outpatient Follow-up:   Follow up with PCP in 1-2 weeks  PCP Please obtain BMP/CBC, 2 view CXR in 1week,  (see Discharge instructions)   PCP Please follow up on the following pending results: None   Home Health: None   Equipment/Devices: None  Consultations: Neuro Discharge Condition: Fair   CODE STATUS: DNR   Diet Recommendation: DIET DYS 3 with full feeding assistance and aspiration precautions   Chief Complaint  Patient presents with  . Code Stroke     Brief history of present illness from the day of admission and additional interim summary     Ethan Faulkner a 83 y.o.malewith medical history significant for hypertension, hypothyroidism, chronic atrial fibrillation on a look was, and chronic leukopenia and thrombocytopenia who presents to the emergency department with weakness involving the left arm, left leg, and left face, with mild confusion.  Other workup suggested bilateral cerebral infarcts along with delirium.  Hospital issues addressed      1. 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 He will now be discharged to SNF with outpatient neuro and PCP follow up.  2. Chronic atrial fibrillation [place] vasc 2 score of now 4. Continue Eliquis and beta blocker.  3. Hypothyroidism. Continue  Synthroid. TSH stable.  4. Hypertension. Home regimen resumed which is combination of beta blocker and Norvasc.  5.Acute delirium. Much improved with supportive care, avoid benzodiazepines and narcotics.   Discharge diagnosis     Principal Problem:   TIA (transient ischemic attack) Active Problems:   Atrial fibrillation (HCC)   Subclinical hypothyroidism   Acute CVA (cerebrovascular accident) [facility])    Discharge instructions    Discharge Instructions    Discharge instructions    Complete by:  As directed    Follow with Primary MD Sallee Lange, MD in 7 days   Get CBC, CMP, 2 view Chest X ray checked  by Primary MD or SNF MD in 5-7 days ( we routinely change or add medications that can affect your baseline labs and fluid status, therefore we recommend that you get the mentioned basic workup next visit with your PCP, your PCP may decide not to get them or add new tests based on their clinical decision)   Activity: As tolerated with Full fall precautions use walker/cane & assistance as needed   Disposition SNF   Diet:   DIET DYS 3 with feeding assistance and aspiration precautions.  For Heart failure patients - Check your Weight same time everyday, if you gain over 2 pounds, or you develop in  leg swelling, experience more shortness of breath or chest pain, call your Primary MD immediately. Follow Cardiac Low Salt Diet and 1.5 lit/day fluid restriction.   On your next visit with your primary care physician please Get Medicines reviewed and adjusted.   Please request your Prim.MD to go over all Hospital Tests and Procedure/Radiological results at the follow up, please get all Hospital records sent to your Prim MD by signing hospital release before you go home.   If you experience worsening of your admission symptoms, develop shortness of breath, life threatening emergency, suicidal or homicidal thoughts you must seek medical attention immediately by calling 911 or calling  your MD immediately  if symptoms less severe.  You Must read complete instructions/literature along with all the possible adverse reactions/side effects for all the Medicines you take and that have been prescribed to you. Take any new Medicines after you have completely understood and accpet all the possible adverse reactions/side effects.   Do not drive, operate heavy machinery, perform activities at heights, swimming or participation in water activities or provide baby sitting services if your were admitted for syncope or siezures until you have seen by Primary MD or a Neurologist and advised to do so again.  Do not drive when taking Pain medications.    Do not take more than prescribed Pain, Sleep and Anxiety Medications  Special Instructions: If you have smoked or chewed Tobacco  in the last 2 yrs please stop smoking, stop any regular Alcohol  and or any Recreational drug use.  Wear Seat belts while driving.   Please note  You were cared for by a hospitalist during your hospital stay. If you have any questions about your discharge medications or the care you received while you were in the hospital after you are discharged, you can call the unit and asked to speak with the hospitalist on call if the hospitalist that took care of you is not available. Once you are discharged, your primary care physician will handle any further medical issues. Please note that NO REFILLS for any discharge medications will be authorized once you are discharged, as it is imperative that you return to your primary care physician (or establish a relationship with a primary care physician if you do not have one) for your aftercare needs so that they can reassess your need for medications and monitor your lab values.   Increase activity slowly    Complete by:  As directed       Discharge Medications   Allergies as of 05/25/2016      Reactions   Xanax [alprazolam] Other (See Comments)   Dizzy, Sedated   Tape  Rash   PATIENT'S SKIN WILL TEAR/PLEASE EITHER USE PAPER TAPE OR COBAN WRAP!!      Medication List    STOP taking these medications   alendronate 70 MG tablet Commonly known as:  FOSAMAX     TAKE these medications   acetaminophen 325 MG tablet Commonly known as:  TYLENOL Take 325-650 mg by mouth every 6 (six) hours as needed for mild pain or moderate pain.   amLODipine 2.5 MG tablet Commonly known as:  NORVASC Take 1 tablet (2.5 mg total) by mouth daily.   apixaban 5 MG Tabs tablet Commonly known as:  ELIQUIS TAKE (1) TABLET BY MOUTH TWICE DAILY.   aspirin EC 81 MG tablet Take 1 tablet (81 mg total) by mouth daily.   cetirizine 10 MG tablet Commonly known as:  ZYRTEC Take 10 mg  by mouth daily.   levothyroxine 25 MCG tablet Commonly known as:  SYNTHROID, LEVOTHROID TAKE (1) TABLET BY MOUTH EACH MORNING.   metoprolol succinate 25 MG 24 hr tablet Commonly known as:  TOPROL-XL Take 1 tablet (25 mg total) by mouth daily. What changed:  when to take this   polyethylene glycol powder powder Commonly known as:  GLYCOLAX/MIRALAX MIX 1 CAPFUL IN 8 OZ OF WATER AND DRINK DAILY. What changed:  See the new instructions.       Follow-up Information    Sallee Lange, MD. Schedule an appointment as soon as possible for a visit in 1 week(s).   Specialty:  Family Medicine Contact information: [place] [address] [phone]        [facility], Trey Sailors, MD. Schedule an appointment as soon as possible for a visit in 1 week(s).   Specialty:  Neurology Contact information: [address] [place] [address] [phone]           Major procedures and Radiology Reports - PLEASE review detailed and final reports thoroughly  -         Ct Angio Head W Or Wo Contrast  Result Date: 05/21/2016 CLINICAL DATA:  Stroke.  New left-sided weakness. EXAM: CT ANGIOGRAPHY HEAD AND NECK TECHNIQUE: Multidetector CT imaging of the head and neck was  performed using the standard protocol during bolus administration of intravenous contrast. Multiplanar CT image reconstructions and MIPs were obtained to evaluate the vascular anatomy. Carotid stenosis measurements (when applicable) are obtained utilizing NASCET criteria, using the distal internal carotid diameter as the denominator. CONTRAST:  75 mL Isovue 370 IV COMPARISON:  CT head 05/21/2016 FINDINGS: CTA NECK FINDINGS Aortic arch: Atherosclerotic calcification in the aortic arch. Bovine branching pattern. Atherosclerotic calcification proximal great vessels without significant stenosis. There is extensive venous contrast on the study due to late timing. In addition, there is reflux of dense contrast up the left jugular vein from a left arm injection due to left innominate vein stenosis. Right carotid system: Right common carotid artery widely patent with mild atherosclerotic calcification. Atherosclerotic calcification right carotid bifurcation. No significant internal carotid artery stenosis. Mild stenosis external carotid artery due to calcific plaque. Left carotid system: Left common carotid artery widely patent. Atherosclerotic calcification left carotid bifurcation without significant carotid stenosis. Vertebral arteries: Both vertebral arteries are patent to the basilar without significant stenosis. Skeleton: Mild degenerative changes in the cervical spine. No acute skeletal abnormality. Other neck: Negative for mass or adenopathy Upper chest: Bilateral pleural effusions right greater than left. Lung apices clear. Review of the MIP images confirms the above findings CTA HEAD FINDINGS Anterior circulation: Extensive atherosclerotic calcification in the cavernous carotid bilaterally causing mild stenosis. Anterior middle cerebral arteries patent bilaterally. Mild atherosclerotic irregularity in the middle cerebral artery branches bilaterally. Posterior circulation: Both vertebral arteries patent to the  basilar with ectasia due to atherosclerotic disease. The basilar is tortuous and ectatic. No significant basilar stenosis. PICA patent bilaterally. Superior cerebellar and posterior cerebral arteries patent bilaterally without stenosis. Venous sinuses: Patent Anatomic variants: None Delayed phase: Normal enhancement on delayed imaging. No enhancing mass lesion. Generalized atrophy with chronic microvascular ischemia. Review of the MIP images confirms the above findings IMPRESSION: Atherosclerotic calcification in the carotid bifurcation bilaterally without significant carotid stenosis in the neck. Atherosclerotic calcification and mild stenosis in the cavernous carotid bilaterally. No significant vertebral artery stenosis. Atherosclerotic irregularity in middle cerebral artery branches bilaterally without significant intracranial stenosis. Diffusely ectatic and tortuous intracranial  circulation likely due to chronic hypertension. Bilateral pleural effusions right greater than left. Electronically Signed   By: Franchot Gallo M.D.   On: 05/21/2016 19:06   Ct Head Wo Contrast  Result Date: 05/21/2016 CLINICAL DATA:  New left-sided weakness, code stroke EXAM: CT HEAD WITHOUT CONTRAST TECHNIQUE: Contiguous axial images were obtained from the base of the skull through the vertex without intravenous contrast. COMPARISON:  11/20/2015 FINDINGS: Brain: No intracranial hemorrhage, mass effect or midline shift. No definite acute cortical infarction. Stable atrophy and chronic small vessel ischemic changes. No mass lesion is noted on this unenhanced scan. Ventricular size is stable from prior exam. Vascular: Atherosclerotic calcifications of carotid siphon again noted. Atherosclerotic calcifications of bilateral vertebral arteries. Skull: No skull fracture is noted. Sinuses/Orbits: No acute findings. Other: None IMPRESSION: No acute intracranial abnormality. Stable atrophy and chronic white matter disease. No definite acute  cortical infarction. These results were called by telephone at the time of interpretation on 05/21/2016 at 5:40 pm to Dr. Orlie Dakin , who verbally acknowledged these results. Electronically Signed   By: Lahoma Crocker M.D.   On: 05/21/2016 17:40   Ct Angio Neck W And/or Wo Contrast  Result Date: 05/21/2016 CLINICAL DATA:  Stroke.  New left-sided weakness. EXAM: CT ANGIOGRAPHY HEAD AND NECK TECHNIQUE: Multidetector CT imaging of the head and neck was performed using the standard protocol during bolus administration of intravenous contrast. Multiplanar CT image reconstructions and MIPs were obtained to evaluate the vascular anatomy. Carotid stenosis measurements (when applicable) are obtained utilizing NASCET criteria, using the distal internal carotid diameter as the denominator. CONTRAST:  75 mL Isovue 370 IV COMPARISON:  CT head 05/21/2016 FINDINGS: CTA NECK FINDINGS Aortic arch: Atherosclerotic calcification in the aortic arch. Bovine branching pattern. Atherosclerotic calcification proximal great vessels without significant stenosis. There is extensive venous contrast on the study due to late timing. In addition, there is reflux of dense contrast up the left jugular vein from a left arm injection due to left innominate vein stenosis. Right carotid system: Right common carotid artery widely patent with mild atherosclerotic calcification. Atherosclerotic calcification right carotid bifurcation. No significant internal carotid artery stenosis. Mild stenosis external carotid artery due to calcific plaque. Left carotid system: Left common carotid artery widely patent. Atherosclerotic calcification left carotid bifurcation without significant carotid stenosis. Vertebral arteries: Both vertebral arteries are patent to the basilar without significant stenosis. Skeleton: Mild degenerative changes in the cervical spine. No acute skeletal abnormality. Other neck: Negative for mass or adenopathy Upper chest: Bilateral  pleural effusions right greater than left. Lung apices clear. Review of the MIP images confirms the above findings CTA HEAD FINDINGS Anterior circulation: Extensive atherosclerotic calcification in the cavernous carotid bilaterally causing mild stenosis. Anterior middle cerebral arteries patent bilaterally. Mild atherosclerotic irregularity in the middle cerebral artery branches bilaterally. Posterior circulation: Both vertebral arteries patent to the basilar with ectasia due to atherosclerotic disease. The basilar is tortuous and ectatic. No significant basilar stenosis. PICA patent bilaterally. Superior cerebellar and posterior cerebral arteries patent bilaterally without stenosis. Venous sinuses: Patent Anatomic variants: None Delayed phase: Normal enhancement on delayed imaging. No enhancing mass lesion. Generalized atrophy with chronic microvascular ischemia. Review of the MIP images confirms the above findings IMPRESSION: Atherosclerotic calcification in the carotid bifurcation bilaterally without significant carotid stenosis in the neck. Atherosclerotic calcification and mild stenosis in the cavernous carotid bilaterally. No significant vertebral artery stenosis. Atherosclerotic irregularity in middle cerebral artery branches bilaterally without significant intracranial stenosis. Diffusely ectatic and tortuous intracranial circulation  likely due to chronic hypertension. Bilateral pleural effusions right greater than left. Electronically Signed   By: Franchot Gallo M.D.   On: 05/21/2016 19:06   Mr Jodene Nam Head Wo Contrast  Result Date: 05/22/2016 CLINICAL DATA:  Left leg weakness. EXAM: MRI HEAD WITHOUT CONTRAST MRA HEAD WITHOUT CONTRAST TECHNIQUE: Multiplanar, multiecho pulse sequences of the brain and surrounding structures were obtained without intravenous contrast. Angiographic images of the head were obtained using MRA technique without contrast. COMPARISON:  Head and neck CTA from yesterday FINDINGS:  MRI HEAD FINDINGS Brain: Confluent area of restricted diffusion in the low genu of the internal capsule on the right. This area measures up to 19 mm and has a wedge-shaped on coronal acquisition. Punctate acute infarct at the medial edge of the left central sulcus. Apparent areas of DWI hyperintensity in the posterior left centrum semiovale are likely related to artifact from neighboring mineralization. No acute hemorrhage. Chronic blood products in the left cerebral white matter, bilateral cerebellum, right thalamus, and bilateral posterior cerebral cortex. The overall pattern suggests hypertensive hemorrhages. Chronic small-vessel disease with ischemic gliosis confluent around the lateral ventricles and patchy throughout the pons. No mass or hydrocephalus. Vascular: Arterial findings below. Some dephasing in the dural venous sinuses, which were patent on CTA yesterday. Skull and upper cervical spine: Negative Sinuses/Orbits: Negative Other: Motion degraded exam which could obscure or cingulate pathology. MRA HEAD FINDINGS Limited due to motion. Poor signal in the left vertebral artery which is likely artifactual given appearance yesterday. Symmetric carotid arteries without stenosis. Poor signal in the bilateral proximal MCA distribution attributed to dephasing. Very poor visualization of bilateral posterior cerebral arteries due to direction of flow. Moderate left P1 segment stenosis is noted. Intracranial tortuosity and arteriomegaly likely from chronic hypertension. Negative for aneurysm. IMPRESSION: 1. 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 2. Punctate acute infarct at the median left central sulcus. 3. Chronic microvascular disease with remote micro hemorrhages. 4. Limited MRA due to artifact. No new or acute finding when compared to CTA from yesterday. Electronically Signed   By: Monte Fantasia M.D.   On: 05/22/2016 09:26   Mr Brain Wo Contrast  Result Date:  05/22/2016 CLINICAL DATA:  Left leg weakness. EXAM: MRI HEAD WITHOUT CONTRAST MRA HEAD WITHOUT CONTRAST TECHNIQUE: Multiplanar, multiecho pulse sequences of the brain and surrounding structures were obtained without intravenous contrast. Angiographic images of the head were obtained using MRA technique without contrast. COMPARISON:  Head and neck CTA from yesterday FINDINGS: MRI HEAD FINDINGS Brain: Confluent area of restricted diffusion in the low genu of the internal capsule on the right. This area measures up to 19 mm and has a wedge-shaped on coronal acquisition. Punctate acute infarct at the medial edge of the left central sulcus. Apparent areas of DWI hyperintensity in the posterior left centrum semiovale are likely related to artifact from neighboring mineralization. No acute hemorrhage. Chronic blood products in the left cerebral white matter, bilateral cerebellum, right thalamus, and bilateral posterior cerebral cortex. The overall pattern suggests hypertensive hemorrhages. Chronic small-vessel disease with ischemic gliosis confluent around the lateral ventricles and patchy throughout the pons. No mass or hydrocephalus. Vascular: Arterial findings below. Some dephasing in the dural venous sinuses, which were patent on CTA yesterday. Skull and upper cervical spine: Negative Sinuses/Orbits: Negative Other: Motion degraded exam which could obscure or cingulate pathology. MRA HEAD FINDINGS Limited due to motion. Poor signal in the left vertebral artery which is likely artifactual given appearance yesterday. Symmetric carotid arteries without  stenosis. Poor signal in the bilateral proximal MCA distribution attributed to dephasing. Very poor visualization of bilateral posterior cerebral arteries due to direction of flow. Moderate left P1 segment stenosis is noted. Intracranial tortuosity and arteriomegaly likely from chronic hypertension. Negative for aneurysm. IMPRESSION: 1. Acute perforator infarct affecting  the low right internal capsule. 2. Punctate acute infarct at the median left central sulcus. 3. Chronic microvascular disease with remote micro hemorrhages. 4. Limited MRA due to artifact. No new or acute finding when compared to CTA from yesterday. Electronically Signed   By: Monte Fantasia M.D.   On: 05/22/2016 09:26    Micro Results     No results found for this or any previous visit (from the past 240 hour(s)).  Today   Subjective    [name] today has no headache,no chest abdominal pain,no new weakness tingling or numbness, feels much better wants to go home today.     Objective   Blood pressure 126/72, pulse (!) 49, temperature 97.4 F (36.3 C), temperature source Oral, resp. rate 18, height 5\' 8"  (1.727 m), weight 69.2 kg (152 lb 8.9 oz), SpO2 98 %.   Intake/Output Summary (Last 24 hours) at 05/25/16 0901 Last data filed at 05/25/16 0542  Gross per 24 hour  Intake                0 ml  Output              250 ml  Net             -250 ml    Exam Awake Alert, Oriented x 3, No new F.N deficits, Normal affect [place].AT,PERRAL Supple Neck,No JVD, No cervical lymphadenopathy appriciated.  Symmetrical Chest wall movement, Good air movement bilaterally, CTAB RRR,No Gallops,Rubs or new Murmurs, No Parasternal Heave +ve B.Sounds, Abd Soft, Non tender, No organomegaly appriciated, No rebound -guarding or rigidity. No Cyanosis, Clubbing or edema, No new Rash or bruise   Data Review   CBC w Diff: Lab Results  Component Value Date   WBC 3.8 (L) 05/21/2016   HGB 13.9 05/21/2016   HCT 41.0 05/21/2016   HCT 41.5 04/29/2015   PLT 136 (L) 05/21/2016   PLT 107 (L) 04/29/2015   LYMPHOPCT 26 05/21/2016   MONOPCT 8 05/21/2016   EOSPCT 2 05/21/2016   BASOPCT 1 05/21/2016    CMP: Lab Results  Component Value Date   NA 136 05/21/2016   NA 141 09/10/2014   K 3.8 05/21/2016   CL 106 05/21/2016   CO2 27 05/21/2016   BUN 19 05/21/2016   BUN 18 09/10/2014   CREATININE 1.00  05/21/2016   CREATININE 1.02 06/06/2013   PROT 6.7 05/21/2016   ALBUMIN 3.8 05/21/2016   BILITOT 1.3 (H) 05/21/2016   ALKPHOS 58 05/21/2016   AST 16 05/21/2016   ALT 11 (L) 05/21/2016  .   Total Time in preparing paper work, data evaluation and todays exam - 35 minutes  Thurnell Lose M.D on 05/25/2016 at 9:01 AM  [facility]   Office  [phone]

## 2016-05-25 NOTE — Progress Notes (Signed)
SLP Cancellation Note  Patient Details Name: Ethan Faulkner MRN: [id number] DOB: 1916/04/30   Cancelled treatment:         Recieved a call from social work reporting pt has experienced significant improvement over the weekend and will be going home rather than going to SNF; recommend home health speech therapy follow up for evaluation and treatment as indicated. Thank you,  Copeland Lapier H. Roddie Mc, CCC-SLP Speech Language Pathologist    Wende Bushy 05/25/2016, 10:08 AM

## 2016-05-26 ENCOUNTER — Encounter: Payer: Self-pay | Admitting: Family Medicine

## 2016-05-26 ENCOUNTER — Ambulatory Visit (INDEPENDENT_AMBULATORY_CARE_PROVIDER_SITE_OTHER): Payer: Medicare Other | Admitting: Family Medicine

## 2016-05-26 VITALS — BP 110/68 | Ht 70.0 in | Wt 150.0 lb

## 2016-05-26 DIAGNOSIS — E785 Hyperlipidemia, unspecified: Secondary | ICD-10-CM

## 2016-05-26 DIAGNOSIS — I482 Chronic atrial fibrillation, unspecified: Secondary | ICD-10-CM

## 2016-05-26 DIAGNOSIS — R29898 Other symptoms and signs involving the musculoskeletal system: Secondary | ICD-10-CM

## 2016-05-26 DIAGNOSIS — Z79899 Other long term (current) drug therapy: Secondary | ICD-10-CM

## 2016-05-26 NOTE — Progress Notes (Signed)
   Subjective:    Patient ID: Ethan Faulkner, male    DOB: 1916/04/29, 83 y.o.   MRN: [id number]  HPIFollow up Hospitalization for stroke. Patient was in the hospital 3 days for stroke initially it was thought he was can had to go into inpatient setting but then he gained strength and was able to go home. Patient states this appetite is doing okay. Still has some bouts of confusion. Still having some weakness. Will benefit from outpatient therapy.  Confusion. Intermittent confusion. At times feels low bit confused other times less with it. Other times he is doing quite well.  Vomited last night a couple of times. Patient relates 2 episodes of vomiting last night for no specific reason denies any abdominal pain fever chills sweats cough congestion    Review over the lab findings x-ray findings MRI findings from his recent hospitalization review of her specialists input also  Review of Systems Patient relates a little bit a headache since having a stroke denies high fever chills denies vomiting. States his energy level is been subpar. Having some left side weakness. Denies any chest pressure tightness pain denies rectal bleeding    Objective:   Physical Exam Heart irregular rate controlled lungs are clear no crackles Patient with some weakness on the side of his face as well as in his leg but able to walk fairly well with walker  25 minutes was spent with the patient. Greater than half the time was spent in discussion and answering questions and counseling regarding the issues that the patient came in for today. Greater than half the time was spent evaluating how he is doing currently as well as discussing the particulars of his stroke and the risk of reoccurrence of stroke in the best way to prevent this from reoccurring. Greater than half was spent in discussion of questions.    Assessment & Plan:  Recent stroke continue Eliquis as well as low-dose aspirin Outpatient physical therapy  occupational therapy Patient does not qualify for inpatient care currently Importance of keeping someone with the patient to lessen the risk of accidental treatment of his medicines or other accidents Mild confusion intermittent related to previous stroke and his age Lipid profile recommended to make sure lipid numbers are good to lessen the risk of further strokes  Recheck patient in 4 weeks

## 2016-05-27 DIAGNOSIS — I10 Essential (primary) hypertension: Secondary | ICD-10-CM | POA: Diagnosis not present

## 2016-05-27 DIAGNOSIS — E039 Hypothyroidism, unspecified: Secondary | ICD-10-CM | POA: Diagnosis not present

## 2016-05-27 DIAGNOSIS — D696 Thrombocytopenia, unspecified: Secondary | ICD-10-CM | POA: Diagnosis not present

## 2016-05-27 DIAGNOSIS — Z8546 Personal history of malignant neoplasm of prostate: Secondary | ICD-10-CM | POA: Diagnosis not present

## 2016-05-27 DIAGNOSIS — I482 Chronic atrial fibrillation: Secondary | ICD-10-CM | POA: Diagnosis not present

## 2016-05-27 DIAGNOSIS — I69318 Other symptoms and signs involving cognitive functions following cerebral infarction: Secondary | ICD-10-CM | POA: Diagnosis not present

## 2016-05-27 DIAGNOSIS — D72819 Decreased white blood cell count, unspecified: Secondary | ICD-10-CM | POA: Diagnosis not present

## 2016-05-27 DIAGNOSIS — N2889 Other specified disorders of kidney and ureter: Secondary | ICD-10-CM | POA: Diagnosis not present

## 2016-05-28 ENCOUNTER — Telehealth: Payer: Self-pay | Admitting: Family Medicine

## 2016-05-28 DIAGNOSIS — D696 Thrombocytopenia, unspecified: Secondary | ICD-10-CM | POA: Diagnosis not present

## 2016-05-28 DIAGNOSIS — I69318 Other symptoms and signs involving cognitive functions following cerebral infarction: Secondary | ICD-10-CM | POA: Diagnosis not present

## 2016-05-28 DIAGNOSIS — D72819 Decreased white blood cell count, unspecified: Secondary | ICD-10-CM | POA: Diagnosis not present

## 2016-05-28 DIAGNOSIS — I482 Chronic atrial fibrillation: Secondary | ICD-10-CM | POA: Diagnosis not present

## 2016-05-28 DIAGNOSIS — I10 Essential (primary) hypertension: Secondary | ICD-10-CM | POA: Diagnosis not present

## 2016-05-28 DIAGNOSIS — N2889 Other specified disorders of kidney and ureter: Secondary | ICD-10-CM | POA: Diagnosis not present

## 2016-05-28 NOTE — Telephone Encounter (Signed)
[facility] dropped off fl2 to be filled out. In red folder in office.

## 2016-05-28 NOTE — Telephone Encounter (Signed)
Patient was seen 1/2 and now daughter states cant walk or bath himself.He is needing rehab at [facility]Son-law Elta Guadeloupe called to get him in but they need paper work filled out to help get him in. Pares in your yellow folder.

## 2016-05-29 DIAGNOSIS — Z85528 Personal history of other malignant neoplasm of kidney: Secondary | ICD-10-CM | POA: Diagnosis not present

## 2016-05-29 DIAGNOSIS — R488 Other symbolic dysfunctions: Secondary | ICD-10-CM | POA: Diagnosis not present

## 2016-05-29 DIAGNOSIS — R1310 Dysphagia, unspecified: Secondary | ICD-10-CM | POA: Diagnosis not present

## 2016-05-29 DIAGNOSIS — Z79899 Other long term (current) drug therapy: Secondary | ICD-10-CM | POA: Diagnosis not present

## 2016-05-29 DIAGNOSIS — D72818 Other decreased white blood cell count: Secondary | ICD-10-CM | POA: Diagnosis not present

## 2016-05-29 DIAGNOSIS — E038 Other specified hypothyroidism: Secondary | ICD-10-CM | POA: Diagnosis not present

## 2016-05-29 DIAGNOSIS — Z7901 Long term (current) use of anticoagulants: Secondary | ICD-10-CM | POA: Diagnosis not present

## 2016-05-29 DIAGNOSIS — Z8701 Personal history of pneumonia (recurrent): Secondary | ICD-10-CM | POA: Diagnosis not present

## 2016-05-29 DIAGNOSIS — Z7982 Long term (current) use of aspirin: Secondary | ICD-10-CM | POA: Diagnosis not present

## 2016-05-29 DIAGNOSIS — I69318 Other symptoms and signs involving cognitive functions following cerebral infarction: Secondary | ICD-10-CM | POA: Diagnosis not present

## 2016-05-29 DIAGNOSIS — R279 Unspecified lack of coordination: Secondary | ICD-10-CM | POA: Diagnosis not present

## 2016-05-29 DIAGNOSIS — I482 Chronic atrial fibrillation: Secondary | ICD-10-CM | POA: Diagnosis not present

## 2016-05-29 DIAGNOSIS — R4182 Altered mental status, unspecified: Secondary | ICD-10-CM | POA: Diagnosis not present

## 2016-05-29 DIAGNOSIS — J189 Pneumonia, unspecified organism: Secondary | ICD-10-CM | POA: Diagnosis not present

## 2016-05-29 DIAGNOSIS — H9201 Otalgia, right ear: Secondary | ICD-10-CM | POA: Diagnosis not present

## 2016-05-29 DIAGNOSIS — R262 Difficulty in walking, not elsewhere classified: Secondary | ICD-10-CM | POA: Diagnosis not present

## 2016-05-29 DIAGNOSIS — R55 Syncope and collapse: Secondary | ICD-10-CM | POA: Diagnosis not present

## 2016-05-29 DIAGNOSIS — I10 Essential (primary) hypertension: Secondary | ICD-10-CM | POA: Diagnosis not present

## 2016-05-29 DIAGNOSIS — R1319 Other dysphagia: Secondary | ICD-10-CM | POA: Diagnosis not present

## 2016-05-29 DIAGNOSIS — R918 Other nonspecific abnormal finding of lung field: Secondary | ICD-10-CM | POA: Diagnosis not present

## 2016-05-29 DIAGNOSIS — Z9181 History of falling: Secondary | ICD-10-CM | POA: Diagnosis not present

## 2016-05-29 DIAGNOSIS — G459 Transient cerebral ischemic attack, unspecified: Secondary | ICD-10-CM | POA: Diagnosis not present

## 2016-05-29 DIAGNOSIS — C642 Malignant neoplasm of left kidney, except renal pelvis: Secondary | ICD-10-CM | POA: Diagnosis not present

## 2016-05-29 DIAGNOSIS — R4189 Other symptoms and signs involving cognitive functions and awareness: Secondary | ICD-10-CM | POA: Diagnosis not present

## 2016-05-29 DIAGNOSIS — R410 Disorientation, unspecified: Secondary | ICD-10-CM | POA: Diagnosis not present

## 2016-05-29 DIAGNOSIS — Z87891 Personal history of nicotine dependence: Secondary | ICD-10-CM | POA: Diagnosis not present

## 2016-05-29 DIAGNOSIS — I639 Cerebral infarction, unspecified: Secondary | ICD-10-CM | POA: Diagnosis not present

## 2016-05-29 DIAGNOSIS — M6281 Muscle weakness (generalized): Secondary | ICD-10-CM | POA: Diagnosis not present

## 2016-05-29 DIAGNOSIS — I6930 Unspecified sequelae of cerebral infarction: Secondary | ICD-10-CM | POA: Diagnosis not present

## 2016-05-29 DIAGNOSIS — R29810 Facial weakness: Secondary | ICD-10-CM | POA: Diagnosis not present

## 2016-05-29 DIAGNOSIS — Z8546 Personal history of malignant neoplasm of prostate: Secondary | ICD-10-CM | POA: Diagnosis not present

## 2016-05-29 DIAGNOSIS — E039 Hypothyroidism, unspecified: Secondary | ICD-10-CM | POA: Diagnosis not present

## 2016-05-29 DIAGNOSIS — R1312 Dysphagia, oropharyngeal phase: Secondary | ICD-10-CM | POA: Diagnosis not present

## 2016-05-29 DIAGNOSIS — Z5189 Encounter for other specified aftercare: Secondary | ICD-10-CM | POA: Diagnosis not present

## 2016-05-29 DIAGNOSIS — D696 Thrombocytopenia, unspecified: Secondary | ICD-10-CM | POA: Diagnosis not present

## 2016-05-29 DIAGNOSIS — R402441 Other coma, without documented Glasgow coma scale score, or with partial score reported, in the field [EMT or ambulance]: Secondary | ICD-10-CM | POA: Diagnosis not present

## 2016-05-29 NOTE — Telephone Encounter (Signed)
This paper was completed and forwarded to the front

## 2016-05-29 NOTE — Telephone Encounter (Signed)
Completed and forwarded to the front

## 2016-05-30 ENCOUNTER — Encounter (HOSPITAL_COMMUNITY)
Admission: RE | Admit: 2016-05-30 | Discharge: 2016-05-30 | Disposition: A | Discharge status: Home / Self Care | Payer: Medicare Other | Source: Other Acute Inpatient Hospital | Attending: Internal Medicine | Admitting: Internal Medicine

## 2016-05-30 DIAGNOSIS — G459 Transient cerebral ischemic attack, unspecified: Secondary | ICD-10-CM | POA: Insufficient documentation

## 2016-05-30 LAB — CBC WITH DIFFERENTIAL/PLATELET
BASOS ABS: 0 10*3/uL (ref 0.0–0.1)
Basophils Relative: 0 %
EOS ABS: 0.2 10*3/uL (ref 0.0–0.7)
EOS PCT: 5 %
HCT: 41.2 % (ref 39.0–52.0)
Hemoglobin: 13.5 g/dL (ref 13.0–17.0)
LYMPHS PCT: 24 %
Lymphs Abs: 1.3 10*3/uL (ref 0.7–4.0)
MCH: 31.1 pg (ref 26.0–34.0)
MCHC: 32.8 g/dL (ref 30.0–36.0)
MCV: 94.9 fL (ref 78.0–100.0)
MONO ABS: 0.8 10*3/uL (ref 0.1–1.0)
Monocytes Relative: 15 %
Neutro Abs: 2.9 10*3/uL (ref 1.7–7.7)
Neutrophils Relative %: 56 %
PLATELETS: 145 10*3/uL — AB (ref 150–400)
RBC: 4.34 MIL/uL (ref 4.22–5.81)
RDW: 14.3 % (ref 11.5–15.5)
WBC: 5.2 10*3/uL (ref 4.0–10.5)

## 2016-05-30 LAB — BASIC METABOLIC PANEL
Anion gap: 6 (ref 5–15)
BUN: 26 mg/dL — AB (ref 6–20)
CO2: 26 mmol/L (ref 22–32)
CREATININE: 0.98 mg/dL (ref 0.61–1.24)
Calcium: 9.1 mg/dL (ref 8.9–10.3)
Chloride: 105 mmol/L (ref 101–111)
GFR calc Af Amer: >60 mL/min (ref >60)
GLUCOSE: 86 mg/dL (ref 65–99)
POTASSIUM: 3.6 mmol/L (ref 3.5–5.1)
SODIUM: 137 mmol/L (ref 135–145)

## 2016-06-01 ENCOUNTER — Non-Acute Institutional Stay (SKILLED_NURSING_FACILITY): Payer: Medicare Other | Admitting: Internal Medicine

## 2016-06-01 ENCOUNTER — Encounter (HOSPITAL_COMMUNITY)
Admission: RE | Admit: 2016-06-01 | Discharge: 2016-06-01 | Disposition: A | Discharge status: Home / Self Care | Payer: Medicare Other | Source: Skilled Nursing Facility | Attending: Internal Medicine | Admitting: Internal Medicine

## 2016-06-01 ENCOUNTER — Encounter: Payer: Self-pay | Admitting: Internal Medicine

## 2016-06-01 DIAGNOSIS — D696 Thrombocytopenia, unspecified: Secondary | ICD-10-CM

## 2016-06-01 DIAGNOSIS — I482 Chronic atrial fibrillation, unspecified: Secondary | ICD-10-CM

## 2016-06-01 DIAGNOSIS — I639 Cerebral infarction, unspecified: Secondary | ICD-10-CM

## 2016-06-01 DIAGNOSIS — I10 Essential (primary) hypertension: Secondary | ICD-10-CM | POA: Diagnosis not present

## 2016-06-01 LAB — COMPREHENSIVE METABOLIC PANEL
ALK PHOS: 52 U/L (ref 38–126)
ALT: 12 U/L — AB (ref 17–63)
AST: 15 U/L (ref 15–41)
Albumin: 3.4 g/dL — ABNORMAL LOW (ref 3.5–5.0)
Anion gap: 7 (ref 5–15)
BILIRUBIN TOTAL: 1.3 mg/dL — AB (ref 0.3–1.2)
BUN: 24 mg/dL — ABNORMAL HIGH (ref 6–20)
CALCIUM: 9 mg/dL (ref 8.9–10.3)
CO2: 25 mmol/L (ref 22–32)
CREATININE: 0.93 mg/dL (ref 0.61–1.24)
Chloride: 106 mmol/L (ref 101–111)
GFR calc non Af Amer: >60 mL/min (ref >60)
GLUCOSE: 92 mg/dL (ref 65–99)
Potassium: 4 mmol/L (ref 3.5–5.1)
Sodium: 138 mmol/L (ref 135–145)
TOTAL PROTEIN: 6.3 g/dL — AB (ref 6.5–8.1)

## 2016-06-01 LAB — CBC
HEMATOCRIT: 40.1 % (ref 39.0–52.0)
HEMOGLOBIN: 13.2 g/dL (ref 13.0–17.0)
MCH: 30.8 pg (ref 26.0–34.0)
MCHC: 32.9 g/dL (ref 30.0–36.0)
MCV: 93.7 fL (ref 78.0–100.0)
Platelets: 157 10*3/uL (ref 150–400)
RBC: 4.28 MIL/uL (ref 4.22–5.81)
RDW: 14.1 % (ref 11.5–15.5)
WBC: 5.3 10*3/uL (ref 4.0–10.5)

## 2016-06-01 LAB — TSH: TSH: 3.929 u[IU]/mL (ref 0.350–4.500)

## 2016-06-01 NOTE — Progress Notes (Signed)
Provider:  Veleta Miners Location:   [place] Room Number: 130/P Place of Service:  SNF (31)  PCP: Sallee Lange, MD Patient Care Team: Kathyrn Drown, MD as PCP - General (Family Medicine) Herminio Commons, MD as Attending Physician (Cardiology)  Extended Emergency Contact Information Primary Emergency Contact: Maeola Sarah of [place] Mobile Phone: [phone] Relation: Daughter Secondary Emergency Contact: Pegram,[name] Address: [address]          [place], [place] [address] [place] of [place] Phone: [phone] Work Phone: [phone] Mobile Phone: [phone] Relation: Daughter  Code Status: Full Code Goals of Care: Advanced Directive information Advanced Directives 06/01/2016  Does Patient Have a Medical Advance Directive? Yes  Type of Advance Directive (No Data)  Does patient want to make changes to medical advance directive? No - Patient declined  Copy of [place] in Chart? -  Would patient like information on creating a medical advance directive? -      Chief Complaint  Patient presents with  . New Admit To SNF    HPI: Patient is a 83 y.o. male seen today for admission to SNF for therapy. Patient has h/o Hypertension, Hypothyroidism, Chronic Atrial fibrillation, Chronic Leucopenia and Thrombocytopenia  And renal mass. Osteoporosis S/P Hip fracture in 07/17. He lives by himself with his family support. They had noticed left arm weakness and Left leg weakness with facial droop. The MRI showed  acute to moderate size infarct seen  involving the right basal ganglia and internal capsule. Patient was started on Baby aspirin and his Eliquis was continued. He was initially send home but per his daughter he was not able to do anything for himself  at home and family insisted to bring him to SNF for therapy. Patient was here in 07/17 after Hip repair. Before the stroke patient was very independent and  was living by himself with the help of his Kids. Family has noticed that now he has been incontinent and also not Walking with the walker and also has been more confused. Only Complain patient had was runny nose. Which is chronic for Patient. No cough, Fever or SOB. No sore throat. He also was c/o constipation.  Past Medical History:  Diagnosis Date  . Afib ([place])   . Hypertension   . Hypothyroidism   . Migraine    "maybe monthly" (11/21/2015)  . Prostate cancer ([place]) ~ 1995   S/P radiation tx  . Renal mass    "slow growing something; doctors just watching it right now" (11/21/2015)  . Thyroid disease    hypothyroid   Past Surgical History:  Procedure Laterality Date  . COLONOSCOPY  1999  . ESOPHAGOGASTRODUODENOSCOPY    . LAPAROSCOPIC CHOLECYSTECTOMY  1980s  . PROSTATE BIOPSY  ~ 1995  . TOTAL HIP ARTHROPLASTY Left 11/22/2015   Procedure: LEFT HIP HEMIARTHROPLASTY ;  Surgeon: Leandrew Koyanagi, MD;  Location: [facility];  Service: Orthopedics;  Laterality: Left;    reports that he has quit smoking. He has never used smokeless tobacco. He reports that he drinks alcohol. He reports that he does not use drugs. Social History   Social History  . Marital status: Widowed    Spouse name: N/A  . Number of children: N/A  . Years of education: N/A   Occupational History  . Not on file.   Social History Main Topics  . Smoking status: Former Research scientist (life sciences)  . Smokeless tobacco: Never Used     Comment: 11/21/2015 "might  have smoked 2 packs of cigarettes in my lifetime"  . Alcohol use Yes     Comment: 11/21/2015 "last beer I've had was in ~ 2007"  . Drug use: No  . Sexual activity: No   Other Topics Concern  . Not on file   Social History Narrative  . No narrative on file    Functional Status Survey:    Family History  Problem Relation Age of Onset  . Stroke Sister     in her 42s  . CAD Neg Hx   . COPD Neg Hx   . Diabetes Neg Hx   . Heart disease Neg Hx     Health Maintenance  Topic  Date Due  . TETANUS/TDAP  12/12/2017  . INFLUENZA VACCINE  Completed  . ZOSTAVAX  Completed  . PNA vac Low Risk Adult  Completed    Allergies  Allergen Reactions  . Xanax [Alprazolam] Other (See Comments)    Dizzy, Sedated  . Tape Rash    PATIENT'S SKIN WILL TEAR/PLEASE EITHER USE PAPER TAPE OR COBAN WRAP!!    Allergies as of 06/01/2016      Reactions   Xanax [alprazolam] Other (See Comments)   Dizzy, Sedated   Tape Rash   PATIENT'S SKIN WILL TEAR/PLEASE EITHER USE PAPER TAPE OR COBAN WRAP!!      Medication List       Accurate as of 06/01/16 12:00 PM. Always use your most recent med list.          acetaminophen 325 MG tablet Commonly known as:  TYLENOL Take 325-650 mg by mouth every 6 (six) hours as needed for mild pain or moderate pain.   alendronate 70 MG tablet Commonly known as:  FOSAMAX Take 1 tablet (70 mg total) by mouth every 7 (seven) days. Take with a full glass of water on an empty stomach.   amLODipine 2.5 MG tablet Commonly known as:  NORVASC Take 1 tablet (2.5 mg total) by mouth daily.   apixaban 5 MG Tabs tablet Commonly known as:  ELIQUIS TAKE (1) TABLET BY MOUTH TWICE DAILY.   aspirin EC 81 MG tablet Take 1 tablet (81 mg total) by mouth daily.   cetirizine 10 MG tablet Commonly known as:  ZYRTEC Take 10 mg by mouth daily.   levothyroxine 25 MCG tablet Commonly known as:  SYNTHROID, LEVOTHROID TAKE (1) TABLET BY MOUTH EACH MORNING.   metoprolol succinate 25 MG 24 hr tablet Commonly known as:  TOPROL-XL Take 1 tablet (25 mg total) by mouth daily.   polyethylene glycol packet Commonly known as:  MIRALAX / GLYCOLAX Take 17 g by mouth daily as needed.       Review of Systems  Constitutional: Positive for activity change. Negative for appetite change, chills, diaphoresis, fatigue and fever.  HENT: Positive for congestion, nosebleeds, postnasal drip, rhinorrhea, sinus pain and sinus pressure. Negative for sore throat.   Respiratory:  Positive for cough. Negative for apnea, chest tightness and shortness of breath.   Cardiovascular: Negative for chest pain, palpitations and leg swelling.  Gastrointestinal: Positive for constipation. Negative for abdominal distention, abdominal pain, nausea and vomiting.  Genitourinary: Negative for dysuria, frequency and urgency.  Musculoskeletal: Negative for arthralgias, back pain and gait problem.  Skin: Negative.   Neurological: Positive for speech difficulty and weakness. Negative for dizziness and tremors.  Psychiatric/Behavioral: Positive for confusion. Negative for dysphoric mood and sleep disturbance. The patient is not nervous/anxious.     Vitals:   06/01/16 1108  BP: Marland Kitchen)  162/89 Repeat BP was 110/60  Pulse: 78  Resp: 18  Temp: 98.1 F (36.7 C)  TempSrc: Oral   There is no height or weight on file to calculate BMI. Physical Exam  Constitutional: He is oriented to person, place, and time. He appears well-developed and well-nourished.  HENT:  Head: Normocephalic.  Nose: Right sinus exhibits no maxillary sinus tenderness and no frontal sinus tenderness. Left sinus exhibits no maxillary sinus tenderness and no frontal sinus tenderness.  Mouth/Throat: Oropharynx is clear and moist. No oropharyngeal exudate.  Eyes: Pupils are equal, round, and reactive to light.  Cardiovascular: Normal rate.  An irregularly irregular rhythm present.  No murmur heard. Pulmonary/Chest: Effort normal and breath sounds normal. No respiratory distress. He has no wheezes. He has no rales.  Abdominal: Soft. Bowel sounds are normal. He exhibits no distension. There is no tenderness.  Musculoskeletal: He exhibits no edema.  Lymphadenopathy:    He has no cervical adenopathy.  Neurological: He is alert and oriented to person, place, and time.  Moving all extremities well. Had slight weakness in LLE  Skin: Skin is warm and dry.  Psychiatric: He has a normal mood and affect. His behavior is normal.  Thought content normal.    Labs reviewed: Basic Metabolic Panel:  Recent Labs  05/21/16 1726 05/21/16 1751 05/30/16 0700 06/01/16 0700  NA 135 136 137 138  K 3.8 3.8 3.6 4.0  CL 102 106 105 106  CO2 27  --  26 25  GLUCOSE 111* 105* 86 92  BUN 19 19 26* 24*  CREATININE 1.04 1.00 0.98 0.93  CALCIUM 9.0  --  9.1 9.0   Liver Function Tests:  Recent Labs  05/21/16 1726 06/01/16 0700  AST 16 15  ALT 11* 12*  ALKPHOS 58 52  BILITOT 1.3* 1.3*  PROT 6.7 6.3*  ALBUMIN 3.8 3.4*   No results for input(s): LIPASE, AMYLASE in the last 8760 hours. No results for input(s): AMMONIA in the last 8760 hours. CBC:  Recent Labs  11/30/15 0910  05/21/16 1726 05/21/16 1751 05/30/16 0700 06/01/16 0700  WBC 5.3  < > 3.8*  --  5.2 5.3  NEUTROABS 3.5  --  2.4  --  2.9  --   HGB 10.8*  < > 13.2 13.9 13.5 13.2  HCT 32.6*  < > 39.6 41.0 41.2 40.1  MCV 92.9  < > 93.4  --  94.9 93.7  PLT 222  < > 136*  --  145* 157  < > = values in this interval not displayed. Cardiac Enzymes: No results for input(s): CKTOTAL, CKMB, CKMBINDEX, TROPONINI in the last 8760 hours. BNP: Invalid input(s): POCBNP No results found for: HGBA1C Lab Results  Component Value Date   TSH 3.929 06/01/2016   No results found for: VITAMINB12 No results found for: FOLATE No results found for: IRON, TIBC, FERRITIN  Imaging and Procedures obtained prior to SNF admission: No results found.  Assessment/Plan Acute CVA (cerebrovascular accident)  Patient already on high dose Eliquis. Also started on baby aspirin. Continue therapy. Patient doing well. Once able to walk with the walker family wants to take him back Home  Chronic atrial fibrillation ([place]) On Eliquis. Rate controlled with Beta blocker  Thrombocytopenia  Platelets stable Essential hypertension BP is stable on Norvasc and Beta blocker  Hypothyroid Continue Synthyroid  Family/ staff Communication:   Labs/tests ordered: Follow BMP and CBC

## 2016-06-02 ENCOUNTER — Encounter (HOSPITAL_COMMUNITY): Payer: Self-pay

## 2016-06-02 ENCOUNTER — Inpatient Hospital Stay
Admission: RE | Admit: 2016-06-02 | Discharge: 2016-06-26 | Disposition: A | Discharge status: Home / Self Care | Payer: Medicare Other | Source: Ambulatory Visit | Attending: Internal Medicine | Admitting: Internal Medicine

## 2016-06-02 ENCOUNTER — Non-Acute Institutional Stay (SKILLED_NURSING_FACILITY): Payer: Medicare Other | Admitting: Internal Medicine

## 2016-06-02 ENCOUNTER — Emergency Department (HOSPITAL_COMMUNITY): Payer: Medicare Other

## 2016-06-02 ENCOUNTER — Emergency Department (HOSPITAL_COMMUNITY)
Admission: EM | Admit: 2016-06-02 | Discharge: 2016-06-02 | Disposition: A | Discharge status: Home / Self Care | Payer: Medicare Other | Attending: Emergency Medicine | Admitting: Emergency Medicine

## 2016-06-02 DIAGNOSIS — R4182 Altered mental status, unspecified: Secondary | ICD-10-CM | POA: Diagnosis not present

## 2016-06-02 DIAGNOSIS — Z87891 Personal history of nicotine dependence: Secondary | ICD-10-CM | POA: Insufficient documentation

## 2016-06-02 DIAGNOSIS — Z85528 Personal history of other malignant neoplasm of kidney: Secondary | ICD-10-CM | POA: Insufficient documentation

## 2016-06-02 DIAGNOSIS — J189 Pneumonia, unspecified organism: Secondary | ICD-10-CM

## 2016-06-02 DIAGNOSIS — R4189 Other symptoms and signs involving cognitive functions and awareness: Secondary | ICD-10-CM

## 2016-06-02 DIAGNOSIS — I6930 Unspecified sequelae of cerebral infarction: Secondary | ICD-10-CM | POA: Diagnosis not present

## 2016-06-02 DIAGNOSIS — Z7982 Long term (current) use of aspirin: Secondary | ICD-10-CM | POA: Insufficient documentation

## 2016-06-02 DIAGNOSIS — Z79899 Other long term (current) drug therapy: Secondary | ICD-10-CM | POA: Diagnosis not present

## 2016-06-02 DIAGNOSIS — I10 Essential (primary) hypertension: Secondary | ICD-10-CM | POA: Insufficient documentation

## 2016-06-02 DIAGNOSIS — Z8546 Personal history of malignant neoplasm of prostate: Secondary | ICD-10-CM | POA: Diagnosis not present

## 2016-06-02 DIAGNOSIS — E039 Hypothyroidism, unspecified: Secondary | ICD-10-CM | POA: Insufficient documentation

## 2016-06-02 DIAGNOSIS — R55 Syncope and collapse: Secondary | ICD-10-CM | POA: Diagnosis not present

## 2016-06-02 DIAGNOSIS — R918 Other nonspecific abnormal finding of lung field: Secondary | ICD-10-CM | POA: Diagnosis not present

## 2016-06-02 HISTORY — DX: Thrombocytopenia, unspecified: D69.6

## 2016-06-02 HISTORY — DX: Unspecified atrial fibrillation: I48.91

## 2016-06-02 HISTORY — DX: Cerebral infarction, unspecified: I63.9

## 2016-06-02 HISTORY — DX: Dysphagia, unspecified: R13.10

## 2016-06-02 LAB — I-STAT CHEM 8, ED
BUN: 25 mg/dL — AB (ref 6–20)
CALCIUM ION: 1.21 mmol/L (ref 1.15–1.40)
CREATININE: 1.1 mg/dL (ref 0.61–1.24)
Chloride: 104 mmol/L (ref 101–111)
GLUCOSE: 88 mg/dL (ref 65–99)
HEMATOCRIT: 38 % — AB (ref 39.0–52.0)
Hemoglobin: 12.9 g/dL — ABNORMAL LOW (ref 13.0–17.0)
Potassium: 4.3 mmol/L (ref 3.5–5.1)
Sodium: 138 mmol/L (ref 135–145)
TCO2: 25 mmol/L (ref 0–100)

## 2016-06-02 LAB — CBG MONITORING, ED: Glucose-Capillary: 74 mg/dL (ref 65–99)

## 2016-06-02 MED ORDER — LEVOFLOXACIN 750 MG PO TABS: 750.0000 mg | ORAL_TABLET | Freq: Every day | ORAL | 1 refills | Status: DC

## 2016-06-02 MED ORDER — VANCOMYCIN HCL IN DEXTROSE 1-5 GM/200ML-% IV SOLN
1000.0000 mg | Freq: Once | INTRAVENOUS | Status: AC
  Administered 2016-06-02: 1000 mg via INTRAVENOUS
  Filled 2016-06-02: qty 200

## 2016-06-02 MED ORDER — DEXTROSE 5 % IV SOLN
1.0000 g | Freq: Once | INTRAVENOUS | Status: AC
  Administered 2016-06-02: 1 g via INTRAVENOUS
  Filled 2016-06-02: qty 1

## 2016-06-02 NOTE — Progress Notes (Signed)
Pharmacy Antibiotic Note  Ethan Faulkner is a 83 y.o. male seen in ED on 06/02/2016 with pneumonia.  Pharmacy has been consulted for Vancomycin dosing.  Plan: Vancomycin 1gm IV x 1. MD ordered Cefepime x 1. Monitor labs, micro and vitals.  F/U Admission orders / plans in AM for further dosing.   Height: 5\' 5"  (165.1 cm) Weight: 150 lb (68 kg) IBW/kg (Calculated) : 61.5  Temp (24hrs), Avg:98.4 F (36.9 C), Min:98.4 F (36.9 C), Max:98.4 F (36.9 C)   Recent Labs Lab 05/30/16 0700 06/01/16 0700 06/02/16 1651  WBC 5.2 5.3  --   CREATININE 0.98 0.93 1.10    Estimated Creatinine Clearance: 32.6 mL/min (by C-G formula based on SCr of 1.1 mg/dL).    Allergies  Allergen Reactions  . Xanax [Alprazolam] Other (See Comments)    Dizzy, Sedated  . Tape Rash    PATIENT'S SKIN WILL TEAR/PLEASE EITHER USE PAPER TAPE OR COBAN WRAP!!    Antimicrobials this admission: Vanc 1/9 >>  Cefepime 1/9 >>   Dose adjustments this admission: n/a   Microbiology results:  Thank you for allowing pharmacy to be a part of this patient's care.  Pricilla Larsson 06/02/2016 7:42 PM

## 2016-06-02 NOTE — ED Provider Notes (Signed)
[facility] DEPT Provider Note   CSN: [id number] Arrival date & time: 06/02/16  1635     History   Chief Complaint Chief Complaint  Patient presents with  . Altered Mental Status    HPI Ethan Faulkner is a 83 y.o. male.  Patient brought in from [facility] by EMS. Patient with witnessed event where he was not arousable. Family members were there. They could not wake him up. Patient eventually awoke up on his own around the time the paramedics were arriving. Blood sugars were fine. Upon awakening patient had no complaints. Upon arrival here patient has per family was back to baseline. Patient moving all extremities verbalizing. Patient is a DO NOT RESUSCITATE. Patient has a history of chronic atrial fib and is on blood thinners. No history of fall. No vomiting patient denied any chest pain headache trouble breathing belly pain.      Past Medical History:  Diagnosis Date  . Afib ([place])   . Atrial fibrillation ([place])   . Cerebral infarction ([place])   . Dysphagia   . Hypertension   . Hypothyroidism   . Migraine    "maybe monthly" (11/21/2015)  . Prostate cancer ([place]) ~ 1995   S/P radiation tx  . Renal mass    "slow growing something; doctors just watching it right now" (11/21/2015)  . Stroke [facility])    TIA  . Thrombocytopenia ([place])   . Thyroid disease    hypothyroid    Patient Active Problem List   Diagnosis Date Noted  . Acute CVA (cerebrovascular accident) ([place]) 05/22/2016  . TIA (transient ischemic attack) 05/21/2016  . Left leg weakness   . Leukopenia   . Thrombocytopenia ([place])   . Fall   . Acute blood loss anemia   . Femoral neck fracture ([place]) 11/20/2015  . Renal malignant tumor ([place]) 06/06/2015  . Long-term (current) use of anticoagulants 04/29/2015  . Subclinical hypothyroidism 03/01/2014  . Atrial fibrillation ([place]) 06/09/2013  . STRESS FRACTURE, FOOT 12/12/2008  . CLOSED FRACTURE OF METATARSAL BONE 12/12/2008    Past Surgical History:  Procedure  Laterality Date  . COLONOSCOPY  1999  . ESOPHAGOGASTRODUODENOSCOPY    . LAPAROSCOPIC CHOLECYSTECTOMY  1980s  . PROSTATE BIOPSY  ~ 1995  . TOTAL HIP ARTHROPLASTY Left 11/22/2015   Procedure: LEFT HIP HEMIARTHROPLASTY ;  Surgeon: Leandrew Koyanagi, MD;  Location: [place] [place];  Service: Orthopedics;  Laterality: Left;       Home Medications    Prior to Admission medications   Medication Sig Start Date End Date Taking? Authorizing Provider  acetaminophen (TYLENOL) 325 MG tablet Take 325-650 mg by mouth every 6 (six) hours as needed for mild pain or moderate pain.   Yes Historical Provider, MD  alendronate (FOSAMAX) 70 MG tablet Take 1 tablet (70 mg total) by mouth every 7 (seven) days. Take with a full glass of water on an empty stomach. Patient taking differently: Take 70 mg by mouth every Sunday. Take with a full glass of water on an empty stomach. 03/09/16  Yes Kathyrn Drown, MD  amLODipine (NORVASC) 2.5 MG tablet Take 1 tablet (2.5 mg total) by mouth daily. 05/11/16  Yes Kathyrn Drown, MD  apixaban (ELIQUIS) 5 MG TABS tablet TAKE (1) TABLET BY MOUTH TWICE DAILY. 01/13/16  Yes Kathyrn Drown, MD  aspirin EC 81 MG tablet Take 1 tablet (81 mg total) by mouth daily. 05/21/16  Yes Vianne Bulls, MD  cetirizine (ZYRTEC) 10 MG tablet Take 10 mg by  mouth daily.   Yes Historical Provider, MD  guaiFENesin (MUCINEX) 600 MG 12 hr tablet Take by mouth 2 (two) times daily as needed for cough or to loosen phlegm.   Yes Historical Provider, MD  levothyroxine (SYNTHROID, LEVOTHROID) 25 MCG tablet TAKE (1) TABLET BY MOUTH EACH MORNING. 01/13/16  Yes Kathyrn Drown, MD  metoprolol succinate (TOPROL-XL) 25 MG 24 hr tablet Take 1 tablet (25 mg total) by mouth daily. 01/13/16  Yes Kathyrn Drown, MD  polyethylene glycol (MIRALAX / GLYCOLAX) packet Take 17 g by mouth daily.    Yes Historical Provider, MD  levofloxacin (LEVAQUIN) 750 MG tablet Take 1 tablet (750 mg total) by mouth daily. 06/02/16   Fredia Sorrow, MD     Family History Family History  Problem Relation Age of Onset  . Stroke Sister     in her 56s  . CAD Neg Hx   . COPD Neg Hx   . Diabetes Neg Hx   . Heart disease Neg Hx     Social History Social History  Substance Use Topics  . Smoking status: Former Research scientist (life sciences)  . Smokeless tobacco: Never Used     Comment: 11/21/2015 "might have smoked 2 packs of cigarettes in my lifetime"  . Alcohol use Yes     Comment: 11/21/2015 "last beer I've had was in ~ 2007"     Allergies   Xanax [alprazolam] and Tape   Review of Systems Review of Systems  Constitutional: Negative for fever.  HENT: Negative for congestion.   Eyes: Negative for visual disturbance.  Respiratory: Negative for shortness of breath.   Cardiovascular: Negative for chest pain.  Gastrointestinal: Negative for abdominal pain, nausea and vomiting.  Genitourinary: Negative for dysuria.  Musculoskeletal: Negative for back pain.  Skin: Negative for wound.  Neurological: Positive for syncope. Negative for speech difficulty, weakness and headaches.  Hematological: Does not bruise/bleed easily.  Psychiatric/Behavioral: Negative for confusion.     Physical Exam Updated Vital Signs BP 144/71   Pulse 73   Temp 98.4 F (36.9 C) (Oral)   Resp 19   Ht 5\' 5"  (1.651 m)   Wt 68 kg   SpO2 97%   BMI 24.96 kg/m   Physical Exam  Constitutional: He appears well-developed and well-nourished. No distress.  HENT:  Head: Normocephalic and atraumatic.  Mouth/Throat: Oropharynx is clear and moist.  Eyes: Conjunctivae and EOM are normal. Pupils are equal, round, and reactive to light.  Neck: Normal range of motion. Neck supple.  Cardiovascular: Normal rate, regular rhythm and normal heart sounds.   Pulmonary/Chest: Effort normal and breath sounds normal. No respiratory distress.  Abdominal: Soft. Bowel sounds are normal. There is no tenderness.  Musculoskeletal: Normal range of motion. He exhibits no edema.  Neurological: He is  alert. No cranial nerve deficit or sensory deficit. He exhibits normal muscle tone. Coordination normal.  The patient not ambulated with no obvious focal neuro deficits. Moving all extremities fine.  Skin: Skin is warm.  Nursing note and vitals reviewed.    ED Treatments / Results  Labs (all labs ordered are listed, but only abnormal results are displayed) Labs Reviewed  I-STAT CHEM 8, ED - Abnormal; Notable for the following:       Result Value   BUN 25 (*)    Hemoglobin 12.9 (*)    HCT 38.0 (*)    All other components within normal limits  BASIC METABOLIC PANEL  CBG MONITORING, ED    EKG  EKG Interpretation  Date/Time:  Tuesday June 02 2016 16:46:51 EST Ventricular Rate:  67 PR Interval:    QRS Duration: 109 QT Interval:  419 QTC Calculation: 443 R Axis:   -60 Text Interpretation:  Atrial fibrillation Left anterior fascicular block Abnormal R-wave progression, early transition Probable left ventricular hypertrophy No significant change since last tracing Confirmed by Terren Jandreau  MD, Martino Tompson [phone]) on 06/02/2016 5:30:25 PM       Radiology Ct Head Wo Contrast  Result Date: 06/02/2016 CLINICAL DATA:  Unresponsive.  Mental status changes. EXAM: CT HEAD WITHOUT CONTRAST TECHNIQUE: Contiguous axial images were obtained from the base of the skull through the vertex without intravenous contrast. COMPARISON:  Head CT 05/21/2016 FINDINGS: Brain: Stable age related cerebral atrophy, ventriculomegaly and periventricular white matter disease. No extra-axial fluid collections are identified. No CT findings for acute hemispheric infarction or intracranial hemorrhage. No mass lesions. The brainstem and cerebellum are normal. Vascular: Stable dolichoectasia of the basilar artery. No hyperdense vessels or definite aneurysm. Stable intracranial vascular calcifications. Skull: No skull fracture or bone lesion. Sinuses/Orbits: The paranasal sinuses and mastoid air cells are grossly clear. The  globes are intact. Other: No scalp lesion or hematoma. IMPRESSION: Stable age related cerebral atrophy, ventriculomegaly and periventricular white matter disease. No acute intracranial findings or mass lesion. Electronically Signed   By: Marijo Sanes M.D.   On: 06/02/2016 18:07   Ct Chest Wo Contrast  Result Date: 06/02/2016 CLINICAL DATA:  Question pneumonia abnormal chest x-ray EXAM: CT CHEST WITHOUT CONTRAST TECHNIQUE: Multidetector CT imaging of the chest was performed following the standard protocol without IV contrast. COMPARISON:  06/02/2016 FINDINGS: Cardiovascular: Limited evaluation without the presence of intravenous contrast. Diffuse ectasia of the aorta, ascending segment measures up to 3.9 cm. Atherosclerotic calcifications are present. Extensive coronary artery calcifications. Cardiomegaly. Small amount of physiologic fluid in the superior pericardial recess. Mediastinum/Nodes: Mediastinal adenopathy is present. A right paratracheal lymph node measures 1.5 cm in short axis. There are some calcified nodes also present. Subcarinal lymph node measures 2 cm in short axis. Trachea is midline. The esophagus is grossly unremarkable. Thyroid within normal limits Lungs/Pleura: Small right-sided pleural effusion. Partial consolidation in the right lower lobe with scattered ground-glass density. Additional ground-glass density present within the right middle lobe, lingula and left lower lobe. No left effusion. No pneumothorax. Upper Abdomen: Surgical clips in the gallbladder fossa. No acute abnormality. Musculoskeletal: No chest wall mass or suspicious bone lesions identified. IMPRESSION: 1. Small right-sided pleural effusion with consolidation and ground-glass density in the right middle, right lower, left lower lobe and lingula, consistent with multifocal pneumonia. 2. Ectasia of the aorta with atherosclerotic calcifications present 3. Mild cardiomegaly 4. Moderate mediastinal adenopathy, suspect that this  is reactive Electronically Signed   By: Donavan Foil M.D.   On: 06/02/2016 19:12   Dg Chest Port 1 View  Result Date: 06/02/2016 CLINICAL DATA:  Altered mental status. EXAM: PORTABLE CHEST 1 VIEW COMPARISON:  One-view chest x-ray a 11/20/2015 FINDINGS: The heart is enlarged. Atherosclerotic calcifications are again noted in the aorta. Diffuse interstitial coarsening is again seen. Lung volumes are low. A right perihilar opacity is present. Right lower lobe airspace disease is noted. Minimal left basilar atelectasis is present. IMPRESSION: 1. Right perihilar and lower lobe densities. This may represent infection/pneumonia. A central obstructing mass and postobstructive pneumonitis is also considered. 2. Chronic interstitial coarsening. 3. Aortic atherosclerosis. Electronically Signed   By: San Morelle M.D.   On: 06/02/2016 17:30    Procedures Procedures (including  critical care time)  Medications Ordered in ED Medications  ceFEPIme (MAXIPIME) 1 g in dextrose 5 % 50 mL IVPB (0 g Intravenous Stopped 06/02/16 2029)  vancomycin (VANCOCIN) IVPB 1000 mg/200 mL premix (0 mg Intravenous Stopped 06/02/16 2158)     Initial Impression / Assessment and Plan / ED Course  I have reviewed the triage vital signs and the nursing notes.  Pertinent labs & imaging results that were available during my care of the patient were reviewed by me and considered in my medical decision making (see chart for details).  Clinical Course     Patient workup for the altered mental status at the [facility] where he was difficult to arouse and couldn't wake up has been resolved upon arrival here. EMS stated that he seemed to be back to his normal self family members arrived he is back to his normal self. What caused the symptoms at the [facility] is not clear. Could be TIA just could be the fact that his wakeup center was not working well. Workup here to include head CT showed no acute findings. If it is a TIA patient's  already on blood thinners. And patient does not require additional workup in the hospital. Due to his symptoms he underwent lab work and a chest x-ray which raises concerns for pneumonia patient without any respiratory difficulties here. CT scan of the chest confirmed multifocal pneumonia. Patient started on IV healthcare acquired pneumonia antibiotics patient will need to continue on Levaquin at the [facility]. Patient's nontoxic no acute distress. Patient's oxygen saturations have been fine here. Patient's mental status and activity as been baseline as per family patient without any complaints.  Final Clinical Impressions(s) / ED Diagnoses   Final diagnoses:  HCAP (healthcare-associated pneumonia)  Altered mental status, unspecified altered mental status type    New Prescriptions New Prescriptions   LEVOFLOXACIN (LEVAQUIN) 750 MG TABLET    Take 1 tablet (750 mg total) by mouth daily.     Fredia Sorrow, MD 06/02/16 2250

## 2016-06-02 NOTE — ED Notes (Signed)
Pt transported to CT ?

## 2016-06-02 NOTE — Progress Notes (Signed)
This is an acute visit.  Level care skilled.  Facility is [facility].  Chief complaint-acute visit secondary to unresponsive episode altered mental status.  History of present illness.  Patient is a 83 year old male with history of hypertension hypothyroidism atrial fibrillation chronic leukopenia and thrombocytopenia and renal mass-status post hip fracture in July 2017.  He is here for rehabilitation after sustaining a CVA with left-sided weakness.  Initially apparently he went home but was not able to do anything for himself and  was brought to skilled nursing for therapy.  He is on a baby aspirin as well as  Eliquis  .  Apparently  earlierr today   he was in his usual state he was alert responsive and actually had a pretty good therapy session per discussion with his daughter.  However this afternoon he was lethargic and unresponsive.  r this was an acute change since earlier today.  Vital signs show a blood pressure 100/60-pulse is 64-CBG is 95-and O2 saturation is in the 90s on room air.  When I did shine a light into his eyes his eyes he did become somewhat responsive raised his arms and appeared slightly agitated with the invasive maneuver--with some facial grimacing       Past Medical History:  Diagnosis Date  . Afib ([place])   . Hypertension   . Hypothyroidism   . Migraine    "maybe monthly" (11/21/2015)  . Prostate cancer ([place]) ~ 1995   S/P radiation tx  . Renal mass    "slow growing something; doctors just watching it right now" (11/21/2015)  . Thyroid disease    hypothyroid        Past Surgical History:  Procedure Laterality Date  . COLONOSCOPY  1999  . ESOPHAGOGASTRODUODENOSCOPY    . LAPAROSCOPIC CHOLECYSTECTOMY  1980s  . PROSTATE BIOPSY  ~ 1995  . TOTAL HIP ARTHROPLASTY Left 11/22/2015   Procedure: LEFT HIP HEMIARTHROPLASTY ;  Surgeon: Leandrew Koyanagi, MD;  Location: [facility];  Service: Orthopedics;  Laterality: Left;    reports that  he has quit smoking. He has never used smokeless tobacco. He reports that he drinks alcohol. He reports that he does not use drugs. Social History        Social History  . Marital status: Widowed    Spouse name: N/A  . Number of children: N/A  . Years of education: N/A      Occupational History  . Not on file.         Social History Main Topics  . Smoking status: Former Research scientist (life sciences)  . Smokeless tobacco: Never Used     Comment: 11/21/2015 "might have smoked 2 packs of cigarettes in my lifetime"  . Alcohol use Yes     Comment: 11/21/2015 "last beer I've had was in ~ 2007"  . Drug use: No  . Sexual activity: No       Other Topics Concern  . Not on file      Social History Narrative  . No narrative on file    Functional Status Survey:          Family History  Problem Relation Age of Onset  . Stroke Sister     in her 35s  . CAD Neg Hx   . COPD Neg Hx   . Diabetes Neg Hx   . Heart disease Neg Hx         Health Maintenance  Topic Date Due  . TETANUS/TDAP  12/12/2017  . INFLUENZA  VACCINE  Completed  . ZOSTAVAX  Completed  . PNA vac Low Risk Adult  Completed         Allergies  Allergen Reactions  . Xanax [Alprazolam] Other (See Comments)    Dizzy, Sedated  . Tape Rash    PATIENT'S SKIN WILL TEAR/PLEASE EITHER USE PAPER TAPE OR COBAN WRAP!!        Allergies as of 06/01/2016      Reactions   Xanax [alprazolam] Other (See Comments)   Dizzy, Sedated   Tape Rash   PATIENT'S SKIN WILL TEAR/PLEASE EITHER USE PAPER TAPE OR COBAN WRAP!!               Medication List                      acetaminophen 325 MG tablet Commonly known as:  TYLENOL Take 325-650 mg by mouth every 6 (six) hours as needed for mild pain or moderate pain.  alendronate 70 MG tablet Commonly known as:  FOSAMAX Take 1 tablet (70 mg total) by mouth every 7 (seven) days. Take with a full glass of water on an empty stomach.  amLODipine 2.5  MG tablet Commonly known as:  NORVASC Take 1 tablet (2.5 mg total) by mouth daily.  apixaban 5 MG Tabs tablet Commonly known as:  ELIQUIS TAKE (1) TABLET BY MOUTH TWICE DAILY.  aspirin EC 81 MG tablet Take 1 tablet (81 mg total) by mouth daily.  cetirizine 10 MG tablet Commonly known as:  ZYRTEC Take 10 mg by mouth daily.  levothyroxine 25 MCG tablet Commonly known as:  SYNTHROID, LEVOTHROID TAKE (1) TABLET BY MOUTH EACH MORNING.  metoprolol succinate 25 MG 24 hr tablet Commonly known as:  TOPROL-XL Take 1 tablet (25 mg total) by mouth daily.  polyethylene glycol packet Commonly known as:  MIRALAX / GLYCOLAX Take 17 g by mouth daily as needed.      Review of Systems   Unobtainable secondary to patient's unresponsiveness     Temperature was pending pulse is 64 blood pressure 100/60-O2 saturation is in the 90s on room air-CBG is 95- Physical Exam  In general this is a frail elderly male he appears to be sleeping-he does respond to the lights in his eyes as noted above--moving his arms- grimacing  skin is warm and dry do not note diaphoresis.  Chest poor respiratory effort he does not follow verbal commands could not appreciate overt congestion or labored breathing.  Heart is regular irregular rate and rhythm he does not have significant lower extremity edema.  Abdomen is soft does not appear to be distended there are positive bowel sounds although somewhat hypoactive-does not appear to be overtly tender he does not grimace when it is palpated.  Musculoskeletal difficult exam since patient irelatively unresponsive however when agitated  he does move his arms and grimaces.  Neurologic again difficult assessment but he appears able to move his arms when agitated-is able to grimace.  Psych again he is largely unresponsive as noted above.  Of note on later reassessment he was more responsive he actually was able to open his eyes he did move his tongue up-and-down  on  command he appeared able to move his legs he did attempt to speak although I could not really make out what he was saying he spoke very softly       .    Labs reviewed: Basic Metabolic Panel:  Recent Labs (within last 365 days)   Recent  Labs  05/21/16 1726 05/21/16 1751 05/30/16 0700 06/01/16 0700  NA 135 136 137 138  K 3.8 3.8 3.6 4.0  CL 102 106 105 106  CO2 27  --  26 25  GLUCOSE 111* 105* 86 92  BUN 19 19 26* 24*  CREATININE 1.04 1.00 0.98 0.93  CALCIUM 9.0  --  9.1 9.0     Liver Function Tests:  Recent Labs (within last 365 days)   Recent Labs  05/21/16 1726 06/01/16 0700  AST 16 15  ALT 11* 12*  ALKPHOS 58 52  BILITOT 1.3* 1.3*  PROT 6.7 6.3*  ALBUMIN 3.8 3.4*     Recent Labs (within last 365 days)  No results for input(s): LIPASE, AMYLASE in the last 8760 hours.   Recent Labs (within last 365 days)  No results for input(s): AMMONIA in the last 8760 hours.   CBC:  Recent Labs (within last 365 days)   Recent Labs  11/30/15 0910  05/21/16 1726 05/21/16 1751 05/30/16 0700 06/01/16 0700  WBC 5.3  < > 3.8*  --  5.2 5.3  NEUTROABS 3.5  --  2.4  --  2.9  --   HGB 10.8*  < > 13.2 13.9 13.5 13.2  HCT 32.6*  < > 39.6 41.0 41.2 40.1  MCV 92.9  < > 93.4  --  94.9 93.7  PLT 222  < > 136*  --  145* 157  < > = values in this interval not displayed.   Cardiac Enzymes: Recent Labs (within last 365 days)  No results for input(s): CKTOTAL, CKMB, CKMBINDEX, TROPONINI in the last 8760 hours.   BNP: Recent Labs (within last 365 days)  Invalid input(s): POCBNP   Recent Labs  No results found for: HGBA1C   Recent Labs       Lab Results  Component Value Date   TSH 3.929 06/01/2016     Recent Labs  No results found for: VITAMINB12   Recent Labs  No results found for: FOLATE   Recent Labs  No results found for: IRON, TIBC, FERRITIN    Assessment and plan.  #1 unresponsive episode-apparently this is a significant change  from his baseline-it is reassuring his vital signs appear to be relatively stable and he is becoming more responsive on reassessment-we are sending him to the ER for expedient evaluation via EMS-one would be concerned about any progression of a CVA or other possible acute process although again his gradual responsiveness is  reassuring   [id number]

## 2016-06-02 NOTE — ED Notes (Signed)
ED Provider at bedside. 

## 2016-06-02 NOTE — ED Triage Notes (Addendum)
Pt transported by EMS from [facility]. Per report pt had been unresponsive to all stimuli  since at 1530 when daughter attempted to wake from nap.  Pt did not wake up to staff or EMS but did arouse when daughter spoke name. Pt is alert and oriented at this time and moves all extremities

## 2016-06-02 NOTE — Discharge Instructions (Signed)
Patient sent over for evaluation of altered mental status that occurred at [facility]. Patient difficult to arouse. Upon arrival here patient act baseline. Symptoms could be related to TIA. Extensive workup here to include head CT without any acute changes. If patient's symptoms due to TIAs already on blood thinners additional workup or admission not required. Chest x-ray raises concerns for possible pneumonia CT scan of the chest confirmed that. Patient treated here with IV healthcare acquired pneumonia antibiotics and will be continued on Levaquin at the [facility]. Patient without hypoxia. Patient with normal vital signs. Patient in no acute distress. Patient mental status has been baseline and normal since he's been here in the emergency department.

## 2016-06-03 ENCOUNTER — Encounter: Payer: Self-pay | Admitting: Internal Medicine

## 2016-06-03 ENCOUNTER — Non-Acute Institutional Stay (SKILLED_NURSING_FACILITY): Payer: Medicare Other | Admitting: Internal Medicine

## 2016-06-03 DIAGNOSIS — I639 Cerebral infarction, unspecified: Secondary | ICD-10-CM | POA: Diagnosis not present

## 2016-06-03 DIAGNOSIS — J189 Pneumonia, unspecified organism: Secondary | ICD-10-CM | POA: Diagnosis not present

## 2016-06-03 DIAGNOSIS — I482 Chronic atrial fibrillation, unspecified: Secondary | ICD-10-CM

## 2016-06-03 NOTE — Progress Notes (Signed)
Location:   [facility] Room Number: 130/P Place of Service:  SNF (31) Provider:  Maxwell Marion, MD  Patient Care Team: Kathyrn Drown, MD as PCP - General (Family Medicine) Herminio Commons, MD as Attending Physician (Cardiology)  Extended Emergency Contact Information Primary Emergency Contact: Maeola Sarah of [place] Phone: [phone] Relation: Daughter Secondary Emergency Contact: Pegram,[name] Address: [address]          [place], [place] [address] [place] Phone: [phone] Work Phone: [phone] Mobile Phone: [phone] Relation: Daughter  Code Status:  Full Code Goals of care: Advanced Directive information Advanced Directives 06/03/2016  Does Patient Have a Medical Advance Directive? Yes  Type of Advance Directive (No Data)  Does patient want to make changes to medical advance directive? No - Patient declined  Copy of [place] in Chart? -  Would patient like information on creating a medical advance directive? -     Chief Complaint  Patient presents with  . Acute Visit    F/U from ER visit    HPI:  Pt is a 83 y.o. male seen today for an acute visit for follow up after ED visit Yesterday. Patient has h/o Hypertension, Hypothyroidism, Chronic Atrial fibrillation, Chronic Leucopenia and Thrombocytopenia  And Left renal mass. Osteoporosis S/P Hip fracture in 07/17.  He was recently admitted for Right Sided CVA leading to Left Side weakness. He is on Baby aspirin and Eliquis. He was doing well with therapy but yesterday Patient family found him not responding. His CBG and others vitals were stable. In ED he became more responsive. He had CT scan of Head and Chest done. CT of head was negative for any acute process abut CT of chest showed multi focal Pneumonia. He was started on Levaquin and discharged back to facility. Patient is sitting in wheel chair and  seems Comfortable. He is not having any cough, SOB, or chest pain. His mental status according to the son in the room ic back to what is was before. They still are worried as patient is till confused since before Stroke.  Past Medical History:  Diagnosis Date  . Afib ([place])   . Atrial fibrillation ([place])   . Cerebral infarction ([place])   . Dysphagia   . Hypertension   . Hypothyroidism   . Migraine    "maybe monthly" (11/21/2015)  . Prostate cancer ([place]) ~ 1995   S/P radiation tx  . Renal mass    "slow growing something; doctors just watching it right now" (11/21/2015)  . Stroke [facility])    TIA  . Thrombocytopenia ([place])   . Thyroid disease    hypothyroid   Past Surgical History:  Procedure Laterality Date  . COLONOSCOPY  1999  . ESOPHAGOGASTRODUODENOSCOPY    . LAPAROSCOPIC CHOLECYSTECTOMY  1980s  . PROSTATE BIOPSY  ~ 1995  . TOTAL HIP ARTHROPLASTY Left 11/22/2015   Procedure: LEFT HIP HEMIARTHROPLASTY ;  Surgeon: Leandrew Koyanagi, MD;  Location: [facility];  Service: Orthopedics;  Laterality: Left;    Allergies  Allergen Reactions  . Xanax [Alprazolam] Other (See Comments)    Dizzy, Sedated  . Tape Rash    PATIENT'S SKIN WILL TEAR/PLEASE EITHER USE PAPER TAPE OR COBAN WRAP!!    Current Outpatient Prescriptions on File Prior to Visit  Medication Sig Dispense Refill  . acetaminophen (TYLENOL) 325 MG tablet Take 325-650 mg by mouth every 6 (six) hours as needed for mild pain  or moderate pain.    Marland Kitchen alendronate (FOSAMAX) 70 MG tablet Take 1 tablet (70 mg total) by mouth every 7 (seven) days. Take with a full glass of water on an empty stomach. 4 tablet 11  . amLODipine (NORVASC) 2.5 MG tablet Take 1 tablet (2.5 mg total) by mouth daily. 30 tablet 3  . apixaban (ELIQUIS) 5 MG TABS tablet TAKE (1) TABLET BY MOUTH TWICE DAILY. 60 tablet 5  . aspirin EC 81 MG tablet Take 1 tablet (81 mg total) by mouth daily. 30 tablet 0  . cetirizine (ZYRTEC) 10 MG tablet Take 10 mg by mouth daily.    Marland Kitchen  guaiFENesin (MUCINEX) 600 MG 12 hr tablet Take by mouth 2 (two) times daily as needed for cough or to loosen phlegm. Listed as mucocyst tablet    . levofloxacin (LEVAQUIN) 750 MG tablet Take 1 tablet (750 mg total) by mouth daily. 7 tablet 1  . levothyroxine (SYNTHROID, LEVOTHROID) 25 MCG tablet TAKE (1) TABLET BY MOUTH EACH MORNING. 30 tablet 5  . metoprolol succinate (TOPROL-XL) 25 MG 24 hr tablet Take 1 tablet (25 mg total) by mouth daily. 30 tablet 5  . polyethylene glycol (MIRALAX / GLYCOLAX) packet Take 17 g by mouth daily.      No current facility-administered medications on file prior to visit.      Review of Systems  Constitutional: Negative for activity change, appetite change, chills and fever.  HENT: Positive for congestion and rhinorrhea. Negative for nosebleeds and postnasal drip.   Respiratory: Negative for apnea, cough, choking, chest tightness and shortness of breath.   Cardiovascular: Negative for chest pain, palpitations and leg swelling.  Gastrointestinal: Negative for abdominal distention, abdominal pain, constipation, diarrhea and nausea.  Musculoskeletal: Negative.   Neurological: Positive for speech difficulty and weakness. Negative for dizziness and light-headedness.  Psychiatric/Behavioral: Positive for confusion and sleep disturbance.    Immunization History  Administered Date(s) Administered  . Influenza Split 02/28/2013  . Influenza,inj,Quad PF,36+ Mos 03/01/2014, 02/18/2015, 02/26/2016  . Influenza-Unspecified 01/24/2012  . PPD Test 11/25/2015  . Pneumococcal Conjugate-13 03/01/2014  . Pneumococcal Polysaccharide-23 01/24/2012  . Td 12/13/2007  . Zoster 09/23/2007   Pertinent  Health Maintenance Due  Topic Date Due  . INFLUENZA VACCINE  Completed  . PNA vac Low Risk Adult  Completed   Fall Risk  11/09/2013  Falls in the past year? No   Functional Status Survey:    Vitals:   06/03/16 1141  BP: (!) 148/70  Pulse: 85  Resp: 20  Temp: 98.3 F  (36.8 C)  TempSrc: Oral   There is no height or weight on file to calculate BMI. Physical Exam  Constitutional: He appears well-developed and well-nourished.  HENT:  Head: Normocephalic.  Mouth/Throat: Oropharynx is clear and moist.  Eyes: Pupils are equal, round, and reactive to light.  Cardiovascular: Normal rate.  An irregular rhythm present.  No murmur heard. Pulmonary/Chest: Effort normal and breath sounds normal. No respiratory distress. He has no wheezes. He has no rales.  Abdominal: Bowel sounds are normal. There is no tenderness. There is no rebound.  Musculoskeletal: Normal range of motion. He exhibits no edema.  Neurological: He is alert.  Skin: Skin is warm and dry.  Psychiatric: He has a normal mood and affect. His behavior is normal.    Labs reviewed:  Recent Labs  05/21/16 1726  05/30/16 0700 06/01/16 0700 06/02/16 1651  NA 135  < > 137 138 138  K 3.8  < > 3.6  4.0 4.3  CL 102  < > 105 106 104  CO2 27  --  26 25  --   GLUCOSE 111*  < > 86 92 88  BUN 19  < > 26* 24* 25*  CREATININE 1.04  < > 0.98 0.93 1.10  CALCIUM 9.0  --  9.1 9.0  --   < > = values in this interval not displayed.  Recent Labs  05/21/16 1726 06/01/16 0700  AST 16 15  ALT 11* 12*  ALKPHOS 58 52  BILITOT 1.3* 1.3*  PROT 6.7 6.3*  ALBUMIN 3.8 3.4*    Recent Labs  11/30/15 0910  05/21/16 1726  05/30/16 0700 06/01/16 0700 06/02/16 1651  WBC 5.3  < > 3.8*  --  5.2 5.3  --   NEUTROABS 3.5  --  2.4  --  2.9  --   --   HGB 10.8*  < > 13.2  < > 13.5 13.2 12.9*  HCT 32.6*  < > 39.6  < > 41.2 40.1 38.0*  MCV 92.9  < > 93.4  --  94.9 93.7  --   PLT 222  < > 136*  --  145* 157  --   < > = values in this interval not displayed. Lab Results  Component Value Date   TSH 3.929 06/01/2016   No results found for: HGBA1C Lab Results  Component Value Date   CHOL 180 09/28/2013   HDL 42 09/28/2013   LDLCALC 102 (H) 09/28/2013   TRIG 182 (H) 09/28/2013   CHOLHDL 4.3 09/28/2013     Significant Diagnostic Results in last 30 days:  Ct Angio Head W Or Wo Contrast  Result Date: 05/21/2016 CLINICAL DATA:  Stroke.  New left-sided weakness. EXAM: CT ANGIOGRAPHY HEAD AND NECK TECHNIQUE: Multidetector CT imaging of the head and neck was performed using the standard protocol during bolus administration of intravenous contrast. Multiplanar CT image reconstructions and MIPs were obtained to evaluate the vascular anatomy. Carotid stenosis measurements (when applicable) are obtained utilizing NASCET criteria, using the distal internal carotid diameter as the denominator. CONTRAST:  75 mL Isovue 370 IV COMPARISON:  CT head 05/21/2016 FINDINGS: CTA NECK FINDINGS Aortic arch: Atherosclerotic calcification in the aortic arch. Bovine branching pattern. Atherosclerotic calcification proximal great vessels without significant stenosis. There is extensive venous contrast on the study due to late timing. In addition, there is reflux of dense contrast up the left jugular vein from a left arm injection due to left innominate vein stenosis. Right carotid system: Right common carotid artery widely patent with mild atherosclerotic calcification. Atherosclerotic calcification right carotid bifurcation. No significant internal carotid artery stenosis. Mild stenosis external carotid artery due to calcific plaque. Left carotid system: Left common carotid artery widely patent. Atherosclerotic calcification left carotid bifurcation without significant carotid stenosis. Vertebral arteries: Both vertebral arteries are patent to the basilar without significant stenosis. Skeleton: Mild degenerative changes in the cervical spine. No acute skeletal abnormality. Other neck: Negative for mass or adenopathy Upper chest: Bilateral pleural effusions right greater than left. Lung apices clear. Review of the MIP images confirms the above findings CTA HEAD FINDINGS Anterior circulation: Extensive atherosclerotic calcification in  the cavernous carotid bilaterally causing mild stenosis. Anterior middle cerebral arteries patent bilaterally. Mild atherosclerotic irregularity in the middle cerebral artery branches bilaterally. Posterior circulation: Both vertebral arteries patent to the basilar with ectasia due to atherosclerotic disease. The basilar is tortuous and ectatic. No significant basilar stenosis. PICA patent bilaterally. Superior cerebellar and posterior cerebral  arteries patent bilaterally without stenosis. Venous sinuses: Patent Anatomic variants: None Delayed phase: Normal enhancement on delayed imaging. No enhancing mass lesion. Generalized atrophy with chronic microvascular ischemia. Review of the MIP images confirms the above findings IMPRESSION: Atherosclerotic calcification in the carotid bifurcation bilaterally without significant carotid stenosis in the neck. Atherosclerotic calcification and mild stenosis in the cavernous carotid bilaterally. No significant vertebral artery stenosis. Atherosclerotic irregularity in middle cerebral artery branches bilaterally without significant intracranial stenosis. Diffusely ectatic and tortuous intracranial circulation likely due to chronic hypertension. Bilateral pleural effusions right greater than left. Electronically Signed   By: Franchot Gallo M.D.   On: 05/21/2016 19:06   Ct Head Wo Contrast  Result Date: 06/02/2016 CLINICAL DATA:  Unresponsive.  Mental status changes. EXAM: CT HEAD WITHOUT CONTRAST TECHNIQUE: Contiguous axial images were obtained from the base of the skull through the vertex without intravenous contrast. COMPARISON:  Head CT 05/21/2016 FINDINGS: Brain: Stable age related cerebral atrophy, ventriculomegaly and periventricular white matter disease. No extra-axial fluid collections are identified. No CT findings for acute hemispheric infarction or intracranial hemorrhage. No mass lesions. The brainstem and cerebellum are normal. Vascular: Stable dolichoectasia of  the basilar artery. No hyperdense vessels or definite aneurysm. Stable intracranial vascular calcifications. Skull: No skull fracture or bone lesion. Sinuses/Orbits: The paranasal sinuses and mastoid air cells are grossly clear. The globes are intact. Other: No scalp lesion or hematoma. IMPRESSION: Stable age related cerebral atrophy, ventriculomegaly and periventricular white matter disease. No acute intracranial findings or mass lesion. Electronically Signed   By: Marijo Sanes M.D.   On: 06/02/2016 18:07   Ct Head Wo Contrast  Result Date: 05/21/2016 CLINICAL DATA:  New left-sided weakness, code stroke EXAM: CT HEAD WITHOUT CONTRAST TECHNIQUE: Contiguous axial images were obtained from the base of the skull through the vertex without intravenous contrast. COMPARISON:  11/20/2015 FINDINGS: Brain: No intracranial hemorrhage, mass effect or midline shift. No definite acute cortical infarction. Stable atrophy and chronic small vessel ischemic changes. No mass lesion is noted on this unenhanced scan. Ventricular size is stable from prior exam. Vascular: Atherosclerotic calcifications of carotid siphon again noted. Atherosclerotic calcifications of bilateral vertebral arteries. Skull: No skull fracture is noted. Sinuses/Orbits: No acute findings. Other: None IMPRESSION: No acute intracranial abnormality. Stable atrophy and chronic white matter disease. No definite acute cortical infarction. These results were called by telephone at the time of interpretation on 05/21/2016 at 5:40 pm to Dr. Orlie Dakin , who verbally acknowledged these results. Electronically Signed   By: Lahoma Crocker M.D.   On: 05/21/2016 17:40   Ct Angio Neck W And/or Wo Contrast  Result Date: 05/21/2016 CLINICAL DATA:  Stroke.  New left-sided weakness. EXAM: CT ANGIOGRAPHY HEAD AND NECK TECHNIQUE: Multidetector CT imaging of the head and neck was performed using the standard protocol during bolus administration of intravenous contrast.  Multiplanar CT image reconstructions and MIPs were obtained to evaluate the vascular anatomy. Carotid stenosis measurements (when applicable) are obtained utilizing NASCET criteria, using the distal internal carotid diameter as the denominator. CONTRAST:  75 mL Isovue 370 IV COMPARISON:  CT head 05/21/2016 FINDINGS: CTA NECK FINDINGS Aortic arch: Atherosclerotic calcification in the aortic arch. Bovine branching pattern. Atherosclerotic calcification proximal great vessels without significant stenosis. There is extensive venous contrast on the study due to late timing. In addition, there is reflux of dense contrast up the left jugular vein from a left arm injection due to left innominate vein stenosis. Right carotid system: Right common carotid artery widely  patent with mild atherosclerotic calcification. Atherosclerotic calcification right carotid bifurcation. No significant internal carotid artery stenosis. Mild stenosis external carotid artery due to calcific plaque. Left carotid system: Left common carotid artery widely patent. Atherosclerotic calcification left carotid bifurcation without significant carotid stenosis. Vertebral arteries: Both vertebral arteries are patent to the basilar without significant stenosis. Skeleton: Mild degenerative changes in the cervical spine. No acute skeletal abnormality. Other neck: Negative for mass or adenopathy Upper chest: Bilateral pleural effusions right greater than left. Lung apices clear. Review of the MIP images confirms the above findings CTA HEAD FINDINGS Anterior circulation: Extensive atherosclerotic calcification in the cavernous carotid bilaterally causing mild stenosis. Anterior middle cerebral arteries patent bilaterally. Mild atherosclerotic irregularity in the middle cerebral artery branches bilaterally. Posterior circulation: Both vertebral arteries patent to the basilar with ectasia due to atherosclerotic disease. The basilar is tortuous and ectatic. No  significant basilar stenosis. PICA patent bilaterally. Superior cerebellar and posterior cerebral arteries patent bilaterally without stenosis. Venous sinuses: Patent Anatomic variants: None Delayed phase: Normal enhancement on delayed imaging. No enhancing mass lesion. Generalized atrophy with chronic microvascular ischemia. Review of the MIP images confirms the above findings IMPRESSION: Atherosclerotic calcification in the carotid bifurcation bilaterally without significant carotid stenosis in the neck. Atherosclerotic calcification and mild stenosis in the cavernous carotid bilaterally. No significant vertebral artery stenosis. Atherosclerotic irregularity in middle cerebral artery branches bilaterally without significant intracranial stenosis. Diffusely ectatic and tortuous intracranial circulation likely due to chronic hypertension. Bilateral pleural effusions right greater than left. Electronically Signed   By: Franchot Gallo M.D.   On: 05/21/2016 19:06   Ct Chest Wo Contrast  Result Date: 06/02/2016 CLINICAL DATA:  Question pneumonia abnormal chest x-ray EXAM: CT CHEST WITHOUT CONTRAST TECHNIQUE: Multidetector CT imaging of the chest was performed following the standard protocol without IV contrast. COMPARISON:  06/02/2016 FINDINGS: Cardiovascular: Limited evaluation without the presence of intravenous contrast. Diffuse ectasia of the aorta, ascending segment measures up to 3.9 cm. Atherosclerotic calcifications are present. Extensive coronary artery calcifications. Cardiomegaly. Small amount of physiologic fluid in the superior pericardial recess. Mediastinum/Nodes: Mediastinal adenopathy is present. A right paratracheal lymph node measures 1.5 cm in short axis. There are some calcified nodes also present. Subcarinal lymph node measures 2 cm in short axis. Trachea is midline. The esophagus is grossly unremarkable. Thyroid within normal limits Lungs/Pleura: Small right-sided pleural effusion. Partial  consolidation in the right lower lobe with scattered ground-glass density. Additional ground-glass density present within the right middle lobe, lingula and left lower lobe. No left effusion. No pneumothorax. Upper Abdomen: Surgical clips in the gallbladder fossa. No acute abnormality. Musculoskeletal: No chest wall mass or suspicious bone lesions identified. IMPRESSION: 1. Small right-sided pleural effusion with consolidation and ground-glass density in the right middle, right lower, left lower lobe and lingula, consistent with multifocal pneumonia. 2. Ectasia of the aorta with atherosclerotic calcifications present 3. Mild cardiomegaly 4. Moderate mediastinal adenopathy, suspect that this is reactive Electronically Signed   By: Donavan Foil M.D.   On: 06/02/2016 19:12   Mr Jodene Nam Head Wo Contrast  Result Date: 05/22/2016 CLINICAL DATA:  Left leg weakness. EXAM: MRI HEAD WITHOUT CONTRAST MRA HEAD WITHOUT CONTRAST TECHNIQUE: Multiplanar, multiecho pulse sequences of the brain and surrounding structures were obtained without intravenous contrast. Angiographic images of the head were obtained using MRA technique without contrast. COMPARISON:  Head and neck CTA from yesterday FINDINGS: MRI HEAD FINDINGS Brain: Confluent area of restricted diffusion in the low genu of the internal capsule on the  right. This area measures up to 19 mm and has a wedge-shaped on coronal acquisition. Punctate acute infarct at the medial edge of the left central sulcus. Apparent areas of DWI hyperintensity in the posterior left centrum semiovale are likely related to artifact from neighboring mineralization. No acute hemorrhage. Chronic blood products in the left cerebral white matter, bilateral cerebellum, right thalamus, and bilateral posterior cerebral cortex. The overall pattern suggests hypertensive hemorrhages. Chronic small-vessel disease with ischemic gliosis confluent around the lateral ventricles and patchy throughout the pons.  No mass or hydrocephalus. Vascular: Arterial findings below. Some dephasing in the dural venous sinuses, which were patent on CTA yesterday. Skull and upper cervical spine: Negative Sinuses/Orbits: Negative Other: Motion degraded exam which could obscure or cingulate pathology. MRA HEAD FINDINGS Limited due to motion. Poor signal in the left vertebral artery which is likely artifactual given appearance yesterday. Symmetric carotid arteries without stenosis. Poor signal in the bilateral proximal MCA distribution attributed to dephasing. Very poor visualization of bilateral posterior cerebral arteries due to direction of flow. Moderate left P1 segment stenosis is noted. Intracranial tortuosity and arteriomegaly likely from chronic hypertension. Negative for aneurysm. IMPRESSION: 1. 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 2. Punctate acute infarct at the median left central sulcus. 3. Chronic microvascular disease with remote micro hemorrhages. 4. Limited MRA due to artifact. No new or acute finding when compared to CTA from yesterday. Electronically Signed   By: Monte Fantasia M.D.   On: 05/22/2016 09:26   Mr Brain Wo Contrast  Result Date: 05/22/2016 CLINICAL DATA:  Left leg weakness. EXAM: MRI HEAD WITHOUT CONTRAST MRA HEAD WITHOUT CONTRAST TECHNIQUE: Multiplanar, multiecho pulse sequences of the brain and surrounding structures were obtained without intravenous contrast. Angiographic images of the head were obtained using MRA technique without contrast. COMPARISON:  Head and neck CTA from yesterday FINDINGS: MRI HEAD FINDINGS Brain: Confluent area of restricted diffusion in the low genu of the internal capsule on the right. This area measures up to 19 mm and has a wedge-shaped on coronal acquisition. Punctate acute infarct at the medial edge of the left central sulcus. Apparent areas of DWI hyperintensity in the posterior left centrum semiovale are likely related to artifact from  neighboring mineralization. No acute hemorrhage. Chronic blood products in the left cerebral white matter, bilateral cerebellum, right thalamus, and bilateral posterior cerebral cortex. The overall pattern suggests hypertensive hemorrhages. Chronic small-vessel disease with ischemic gliosis confluent around the lateral ventricles and patchy throughout the pons. No mass or hydrocephalus. Vascular: Arterial findings below. Some dephasing in the dural venous sinuses, which were patent on CTA yesterday. Skull and upper cervical spine: Negative Sinuses/Orbits: Negative Other: Motion degraded exam which could obscure or cingulate pathology. MRA HEAD FINDINGS Limited due to motion. Poor signal in the left vertebral artery which is likely artifactual given appearance yesterday. Symmetric carotid arteries without stenosis. Poor signal in the bilateral proximal MCA distribution attributed to dephasing. Very poor visualization of bilateral posterior cerebral arteries due to direction of flow. Moderate left P1 segment stenosis is noted. Intracranial tortuosity and arteriomegaly likely from chronic hypertension. Negative for aneurysm. IMPRESSION: 1. 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 2. Punctate acute infarct at the median left central sulcus. 3. Chronic microvascular disease with remote micro hemorrhages. 4. Limited MRA due to artifact. No new or acute finding when compared to CTA from yesterday. Electronically Signed   By: Monte Fantasia M.D.   On: 05/22/2016 09:26   Dg Chest [facility]  1 View  Result Date: 06/02/2016 CLINICAL DATA:  Altered mental status. EXAM: PORTABLE CHEST 1 VIEW COMPARISON:  One-view chest x-ray a 11/20/2015 FINDINGS: The heart is enlarged. Atherosclerotic calcifications are again noted in the aorta. Diffuse interstitial coarsening is again seen. Lung volumes are low. A right perihilar opacity is present. Right lower lobe airspace disease is noted. Minimal left basilar  atelectasis is present. IMPRESSION: 1. Right perihilar and lower lobe densities. This may represent infection/pneumonia. A central obstructing mass and postobstructive pneumonitis is also considered. 2. Chronic interstitial coarsening. 3. Aortic atherosclerosis. Electronically Signed   By: San Morelle M.D.   On: 06/02/2016 17:30    Assessment/Plan  Pneumonia Multi focal. Questionable Aspiration He was started on Levaquin for 7 days. He continues to be afebrile and Normal WBC with Normal POX. D/W Speech therapist they will revaluate Patient to see if he needs Dyphagia diet. Monitor POX. S/P Acute CVA Continue On Aspirin and Eliquis. He does have cognitive impairment which is new per Family. He will need more care on discharge. Doing well with therapy.  Transient Unresponsiveness in facility No obvious cause was found in ED. Will continue to monitor. VSS Q 4 hours for now.   Family/ staff Communication:   Labs/tests ordered:

## 2016-06-04 ENCOUNTER — Other Ambulatory Visit (HOSPITAL_COMMUNITY): Payer: Self-pay | Admitting: Specialist

## 2016-06-04 DIAGNOSIS — R1319 Other dysphagia: Secondary | ICD-10-CM

## 2016-06-08 ENCOUNTER — Ambulatory Visit (HOSPITAL_COMMUNITY)
Admit: 2016-06-08 | Discharge: 2016-06-08 | Disposition: A | Discharge status: Home / Self Care | Payer: No Typology Code available for payment source | Attending: Internal Medicine | Admitting: Internal Medicine

## 2016-06-08 ENCOUNTER — Ambulatory Visit (HOSPITAL_COMMUNITY): Payer: Medicare Other | Attending: Internal Medicine | Admitting: Speech Pathology

## 2016-06-08 ENCOUNTER — Encounter (HOSPITAL_COMMUNITY)
Admission: RE | Admit: 2016-06-08 | Discharge: 2016-06-08 | Disposition: A | Discharge status: Home / Self Care | Payer: Medicare Other | Source: Skilled Nursing Facility | Attending: Internal Medicine | Admitting: Internal Medicine

## 2016-06-08 ENCOUNTER — Encounter (HOSPITAL_COMMUNITY): Payer: Self-pay | Admitting: Speech Pathology

## 2016-06-08 DIAGNOSIS — R1312 Dysphagia, oropharyngeal phase: Secondary | ICD-10-CM

## 2016-06-08 DIAGNOSIS — R1310 Dysphagia, unspecified: Secondary | ICD-10-CM | POA: Diagnosis not present

## 2016-06-08 DIAGNOSIS — R1319 Other dysphagia: Secondary | ICD-10-CM | POA: Diagnosis not present

## 2016-06-08 LAB — CBC
HCT: 39.9 % (ref 39.0–52.0)
HEMOGLOBIN: 13.3 g/dL (ref 13.0–17.0)
MCH: 31.1 pg (ref 26.0–34.0)
MCHC: 33.3 g/dL (ref 30.0–36.0)
MCV: 93.2 fL (ref 78.0–100.0)
PLATELETS: 190 10*3/uL (ref 150–400)
RBC: 4.28 MIL/uL (ref 4.22–5.81)
RDW: 14 % (ref 11.5–15.5)
WBC: 4.4 10*3/uL (ref 4.0–10.5)

## 2016-06-08 LAB — BASIC METABOLIC PANEL
Anion gap: 6 (ref 5–15)
BUN: 23 mg/dL — AB (ref 6–20)
CHLORIDE: 105 mmol/L (ref 101–111)
CO2: 26 mmol/L (ref 22–32)
Calcium: 8.9 mg/dL (ref 8.9–10.3)
Creatinine, Ser: 1.09 mg/dL (ref 0.61–1.24)
GFR calc Af Amer: >60 mL/min (ref >60)
GFR calc non Af Amer: 54 mL/min — ABNORMAL LOW (ref >60)
GLUCOSE: 85 mg/dL (ref 65–99)
POTASSIUM: 3.8 mmol/L (ref 3.5–5.1)
Sodium: 137 mmol/L (ref 135–145)

## 2016-06-08 NOTE — Therapy (Signed)
[employer] [place] [employer] [place], [place], [address] Phone: [phone]   Fax:  [phone]  Modified Barium Swallow  Patient Details  Name: Ethan Faulkner MRN: [id number] Date of Birth: 1916/04/01 No Data Recorded  Encounter Date: 06/08/2016      End of Session - 06/08/16 1939    Visit Number 1   Number of Visits 1   Authorization Type Medicare   SLP Start Time [id number]   SLP Stop Time  1435   SLP Time Calculation (min) 43 min   Activity Tolerance Patient tolerated treatment well      Past Medical History:  Diagnosis Date  . Afib ([place])   . Atrial fibrillation ([place])   . Cerebral infarction ([place])   . Dysphagia   . Hypertension   . Hypothyroidism   . Migraine    "maybe monthly" (11/21/2015)  . Prostate cancer ([place]) ~ 1995   S/P radiation tx  . Renal mass    "slow growing something; doctors just watching it right now" (11/21/2015)  . Stroke [facility])    TIA  . Thrombocytopenia ([place])   . Thyroid disease    hypothyroid    Past Surgical History:  Procedure Laterality Date  . COLONOSCOPY  1999  . ESOPHAGOGASTRODUODENOSCOPY    . LAPAROSCOPIC CHOLECYSTECTOMY  1980s  . PROSTATE BIOPSY  ~ 1995  . TOTAL HIP ARTHROPLASTY Left 11/22/2015   Procedure: LEFT HIP HEMIARTHROPLASTY ;  Surgeon: Leandrew Koyanagi, MD;  Location: [facility];  Service: Orthopedics;  Laterality: Left;    There were no vitals filed for this visit.      Subjective Assessment - 06/08/16 1921    Subjective "I'll cheer for the [employer]."   Special Tests MBSS   Currently in Pain? No/denies             General - 06/08/16 1922      General Information   Date of Onset 05/21/16   HPI Ethan Faulkner is a 83 yo male who was referred for MBSS due to suspicion of aspiration pna following a recent CVA with altered mental status. He was admitted to [facility] with an acute perforator infarct affecting the low right internal capsule + Punctate acute infarct at the  median left central sulcus. He was discharged home initially, but then admitted to [facility] for rehabilitation. He has been on a D3/mech soft diet with thin liquids, but was found to have multifocal pneumonia confirmed by CT scan of the chest.   Type of Study MBS-Modified Barium Swallow Study   Previous Swallow Assessment BSE 2 weeks ago   Diet Prior to this Study Dysphagia 3 (soft);Thin liquids   Temperature Spikes Noted No   Respiratory Status Room air   History of Recent Intubation No   Behavior/Cognition Alert;Cooperative;Pleasant mood   Oral Cavity Assessment Within Functional Limits   Oral Care Completed by SLP No   Oral Cavity - Dentition Dentures, top;Dentures, bottom   Vision Functional for self feeding   Self-Feeding Abilities Able to feed self;Needs set up   Patient Positioning Upright in chair   Baseline Vocal Quality Normal;Low vocal intensity   Volitional Cough Strong   Volitional Swallow Able to elicit   Anatomy Within functional limits   Pharyngeal Secretions Not observed secondary MBS            Oral Preparation/Oral Phase - 06/08/16 1924      Oral Preparation/Oral Phase   Oral Phase Impaired  Oral - Nectar   Oral - Nectar Cup Decreased bolus cohesion  residuals in posterior lateral sulcus     Oral - Thin   Oral - Thin Cup Decreased bolus cohesion;Other (Comment)  residuals in posterior lateral sulcus     Oral - Solids   Oral - Puree Decreased bolus cohesion;Delayed A-P transit;Oral residue;Piecemeal swallowing   Oral - Regular Decreased bolus cohesion;Delayed A-P transit;Oral residue;Piecemeal swallowing   Oral - Pill Delayed A-P transit     Electrical stimulation - Oral Phase   Was Electrical Stimulation Used No          Pharyngeal Phase - 06/08/16 1927      Pharyngeal Phase   Pharyngeal Phase Impaired     Pharyngeal - Nectar   Pharyngeal- Nectar Cup Swallow initiation at vallecula;Reduced epiglottic inversion;Reduced anterior laryngeal  mobility;Pharyngeal residue - valleculae;Pharyngeal residue - pyriform;Lateral channel residue     Pharyngeal - Thin   Pharyngeal- Thin Cup Swallow initiation at vallecula;Reduced epiglottic inversion;Reduced anterior laryngeal mobility;Lateral channel residue;Pharyngeal residue - valleculae;Pharyngeal residue - pyriform;Penetration/Aspiration during swallow;Penetration/Apiration after swallow;Trace aspiration   Pharyngeal Material does not enter airway;Material enters airway, CONTACTS cords and then ejected out;Material enters airway, remains ABOVE vocal cords then ejected out   Pharyngeal- Thin Straw Swallow initiation at vallecula;Swallow initiation at pyriform sinus;Reduced epiglottic inversion;Reduced anterior laryngeal mobility;Reduced airway/laryngeal closure;Penetration/Aspiration during swallow;Trace aspiration;Pharyngeal residue - valleculae;Pharyngeal residue - pyriform;Lateral channel residue   Pharyngeal Material enters airway, CONTACTS cords and then ejected out;Material does not enter airway     Pharyngeal - Solids   Pharyngeal- Puree Swallow initiation at vallecula;Reduced epiglottic inversion;Reduced tongue base retraction;Pharyngeal residue - valleculae;Pharyngeal residue - pyriform;Pharyngeal residue - posterior pharnyx;Reduced pharyngeal peristalsis   Pharyngeal- Regular Delayed swallow initiation-vallecula;Reduced pharyngeal peristalsis;Reduced epiglottic inversion;Reduced tongue base retraction;Pharyngeal residue - valleculae;Pharyngeal residue - pyriform;Pharyngeal residue - posterior pharnyx   Pharyngeal- Pill --  Pt penetrated the thin when taking pill     Electrical Stimulation - Pharyngeal Phase   Was Electrical Stimulation Used No          Cricopharyngeal Phase - 06/08/16 1937      Cervical Esophageal Phase   Cervical Esophageal Phase Within functional limits           Plan - 06/08/16 1940    Clinical Impression Statement Pt seen upright in lateral postion  for MBSS and assessed with barium tinged thin, NTL, puree, regular textures, and barium tablet (12.50mm). Pt presents with mild/mod oropharyngeal dysphagia characterized by delayed oral transit with decreased anterior posterior transit, piecemeal deglutition, premature spillage with swallow trigger after filling valleculae, and decreased epiglottic deflection and tongue base retraction resulting in piecemeal deglutition, oral residue (posterior lateral buccal sulcus), mild/mod vallecular residue, mild lateral channel and pyriform residue, and trace aspiration of thins x2 with immediate expulsion (cup sip taking pill and straw sips). Trace penetration of thins occurred during the swallow on a single cup sip. Pharyngeal transit was noted to be bilateral, however pt with increased residuals noted on the LEFT>RIGHT. Head turn was inconsistently effective and not recommended. NTL were presented and not penetrated, however residuals persistent in pharynx. Verbal cues to repeat/dry swallow were effective in reducing vallecular and pyriform residue across consistencies and textures. Recommend: D3/mech soft with thin liquids via cup sip presentations, NO straws, and po meds whole in puree. Cue pt to swallow 2x for each bite/sip and clear throat periodically. If Pt able to participate in dysphagia therapy, recommend focus on effortful swallows, CTAR, and Mendelsohn to facilitate pharyngeal clearance  of bolus.   Treatment/Interventions Aspiration precaution training;Diet toleration management by SLP;Pharyngeal strengthening exercises;SLP instruction and feedback;Patient/family education   Potential to Achieve Goals Fair   Potential Considerations Severity of impairments   Consulted and Agree with Plan of Care Patient      Patient will benefit from skilled therapeutic intervention in order to improve the following deficits and impairments:   Dysphagia, oropharyngeal phase      G-Codes - 06/27/16 1942    Functional  Assessment Tool Used MBSS; clinical judgment   Functional Limitations Swallowing   Swallow Current Status [id number]) At least 20 percent but less than 40 percent impaired, limited or restricted   Swallow Goal Status [id number]) At least 20 percent but less than 40 percent impaired, limited or restricted   Swallow Discharge Status [phone]) At least 20 percent but less than 40 percent impaired, limited or restricted          Recommendations/Treatment - June 27, 2016 1938      Swallow Evaluation Recommendations   SLP Diet Recommendations Thin;Dysphagia 3 (mechanical soft)   Liquid Administration via Cup;No straw   Medication Administration Whole meds with puree   Supervision Patient able to self feed;Full supervision/cueing for compensatory strategies   Compensations Multiple dry swallows after each bite/sip;Clear throat intermittently   Postural Changes Seated upright at 90 degrees;Remain upright for at least 30 minutes after feeds/meals          Prognosis - 06/27/16 1938      Prognosis   Prognosis for Safe Diet Advancement Fair   Barriers to Reach Goals Severity of deficits     Individuals Consulted   Consulted and Agree with Results and Recommendations Patient;Other (Comment);Family member/caregiver  referring SLP   Family Member Consulted daughter   Report Sent to  Referring physician;Facility (Comment)  [facility]      Problem List Patient Active Problem List   Diagnosis Date Noted  . Acute CVA (cerebrovascular accident) ([place]) 05/22/2016  . TIA (transient ischemic attack) 05/21/2016  . Left leg weakness   . Leukopenia   . Thrombocytopenia ([place])   . Fall   . Acute blood loss anemia   . Femoral neck fracture ([place]) 11/20/2015  . Renal malignant tumor ([place]) 06/06/2015  . Long-term (current) use of anticoagulants 04/29/2015  . Subclinical hypothyroidism 03/01/2014  . Atrial fibrillation ([place]) 06/09/2013  . STRESS FRACTURE, FOOT 12/12/2008  . CLOSED FRACTURE OF METATARSAL BONE  12/12/2008   Thank you,  Genene Churn, [place]  [facility] Jun 27, 2016, 7:43 PM  [facility] [address] [place], [place], [address] Phone: [phone]   Fax:  [phone]  Name: Ethan Faulkner MRN: [id number] Date of Birth: June 24, 1915

## 2016-06-09 ENCOUNTER — Non-Acute Institutional Stay (SKILLED_NURSING_FACILITY): Payer: Medicare Other | Admitting: Internal Medicine

## 2016-06-09 DIAGNOSIS — H9201 Otalgia, right ear: Secondary | ICD-10-CM | POA: Diagnosis not present

## 2016-06-09 DIAGNOSIS — Z8701 Personal history of pneumonia (recurrent): Secondary | ICD-10-CM | POA: Diagnosis not present

## 2016-06-09 DIAGNOSIS — R4189 Other symptoms and signs involving cognitive functions and awareness: Secondary | ICD-10-CM | POA: Diagnosis not present

## 2016-06-09 NOTE — Progress Notes (Signed)
This is an acute visit.  Level of care skilled.  Facility is [facility].  Chief complaint-acute visit secondary to right ear irritation.  History of present illness.  Patient is a pleasant 83 year old male who apparently is complaining of a tickling irritated feeling in his right ear.  He does not report any decreased hearing-however baseline he is quite hard of hearing.  He is not complaining of any ear pain.  Vital signs are stable otherwise does not have any acute complaints.  He was recently hospitalized for suspected pneumonia and is completing a course of Levaquin this appears stable is not really complaining of increased shortness of breath or cough.  Blood work yesterday was reassuring with a white count of 4.4.  He also has a history of CVA he is on aspirin and Eliquis.  When I last saw him he was unresponsive and sent to the ER but no obvious cause was found there been no further incidents to my knowledge his vital signs appear to be stable he is actually bright and alert today when I saw him with no complaints other than right eye irritation   Past Medical History:  Diagnosis Date  . Afib ([place])   . Atrial fibrillation ([place])   . Cerebral infarction ([place])   . Dysphagia   . Hypertension   . Hypothyroidism   . Migraine    "maybe monthly" (11/21/2015)  . Prostate cancer ([place]) ~ 1995   S/P radiation tx  . Renal mass    "slow growing something; doctors just watching it right now" (11/21/2015)  . Stroke [facility])    TIA  . Thrombocytopenia ([place])   . Thyroid disease    hypothyroid        Past Surgical History:  Procedure Laterality Date  . COLONOSCOPY  1999  . ESOPHAGOGASTRODUODENOSCOPY    . LAPAROSCOPIC CHOLECYSTECTOMY  1980s  . PROSTATE BIOPSY  ~ 1995  . TOTAL HIP ARTHROPLASTY Left 11/22/2015   Procedure: LEFT HIP HEMIARTHROPLASTY ;  Surgeon: Leandrew Koyanagi, MD;  Location: [facility];  Service: Orthopedics;  Laterality: Left;    Allergies   Allergen Reactions  . Xanax [Alprazolam] Other (See Comments)    Dizzy, Sedated  . Tape Rash    PATIENT'S SKIN WILL TEAR/PLEASE EITHER USE PAPER TAPE OR COBAN WRAP!!          Current Outpatient Prescriptions on File Prior to Visit  Medication Sig Dispense Refill  . acetaminophen (TYLENOL) 325 MG tablet Take 325-650 mg by mouth every 6 (six) hours as needed for mild pain or moderate pain.    Marland Kitchen alendronate (FOSAMAX) 70 MG tablet Take 1 tablet (70 mg total) by mouth every 7 (seven) days. Take with a full glass of water on an empty stomach. 4 tablet 11  . amLODipine (NORVASC) 2.5 MG tablet Take 1 tablet (2.5 mg total) by mouth daily. 30 tablet 3  . apixaban (ELIQUIS) 5 MG TABS tablet TAKE (1) TABLET BY MOUTH TWICE DAILY. 60 tablet 5  . aspirin EC 81 MG tablet Take 1 tablet (81 mg total) by mouth daily. 30 tablet 0  . cetirizine (ZYRTEC) 10 MG tablet Take 10 mg by mouth daily.    Marland Kitchen guaiFENesin (MUCINEX) 600 MG 12 hr tablet Take by mouth 2 (two) times daily as needed for cough or to loosen phlegm. Listed as mucocyst tablet    . levofloxacin (LEVAQUIN) 750 MG tablet Take 1 tablet (750 mg total) by mouth daily. 7 tablet 1  . levothyroxine (  SYNTHROID, LEVOTHROID) 25 MCG tablet TAKE (1) TABLET BY MOUTH EACH MORNING. 30 tablet 5  . metoprolol succinate (TOPROL-XL) 25 MG 24 hr tablet Take 1 tablet (25 mg total) by mouth daily. 30 tablet 5  . polyethylene glycol (MIRALAX / GLYCOLAX) packet Take 17 g by mouth daily.      No current facility-administered medications on file prior to visit.      Review of Systems  Constitutional: Negative for activity change, appetite change, chills and fever.  HENT: Positive forRight ear irritation Negative for nosebleeds and postnasal drip.   Respiratory: Negative for apnea, cough, choking, chest tightness and shortness of breath.   Cardiovascular: Negative for chest pain, palpitations and leg swelling.  Gastrointestinal: Negative for abdominal  distention, abdominal pain, constipation, diarrhea and nausea.  Musculoskeletal: Negative.   Neurological: Positive for speech difficulty and weakness. Negative for dizziness and light-headedness.  Psychiatric/Behavioral: Positive for confusion        Immunization History  Administered Date(s) Administered  . Influenza Split 02/28/2013  . Influenza,inj,Quad PF,36+ Mos 03/01/2014, 02/18/2015, 02/26/2016  . Influenza-Unspecified 01/24/2012  . PPD Test 11/25/2015  . Pneumococcal Conjugate-13 03/01/2014  . Pneumococcal Polysaccharide-23 01/24/2012  . Td 12/13/2007  . Zoster 09/23/2007       Pertinent  Health Maintenance Due  Topic Date Due  . INFLUENZA VACCINE  Completed  . PNA vac Low Risk Adult  Completed   Fall Risk  11/09/2013  Falls in the past year? No   Functional Status Survey: Temperature is 97.2 pulse 70 respirations 18 blood pressure 113/62  Physical Exam  Constitutional: He appears well-developed and well-nourished. Sitting comfortably in his wheelchair HENT:  Head: Normocephalic.  Ears-tympanic membrane is visualized bilaterally it is pearly gray-he has small amount of wax present in both ears-this does not look like an impaction-right ear I do not see any exudate edema or signs of bleeding-left ear appears unremarkable as well Mouth/Throat: Oropharynx is clear and moist.  Eyes: Pupils are equal, round, and reactive to light.  Cardiovascular: Normal rate.   regular irregular rhythm present.  No murmur heard. Pulmonary/Chest: Effort normal and breath sounds normal. No respiratory distress. He has no wheezes. He has no rales.  Abdominal: Bowel sounds are normal. There is no tenderness. There is no rebound.  Musculoskeletal: Normal range of motion. He exhibits no edema.  Neurological: He is alert.  Skin: Skin is warm and dry.  Psychiatric: He has a normal mood and affect. His behavior is normal.    Labs reviewed:  Jan-- 15 2018.  Sodium 137 potassium  3.8 BUN 23 creatinine 1.09.  WBC 4.4 hemoglobin 13.3 platelets 190.    Recent Labs (within last 365 days)   Recent Labs  05/21/16 1726  05/30/16 0700 06/01/16 0700 06/02/16 1651  NA 135  < > 137 138 138  K 3.8  < > 3.6 4.0 4.3  CL 102  < > 105 106 104  CO2 27  --  26 25  --   GLUCOSE 111*  < > 86 92 88  BUN 19  < > 26* 24* 25*  CREATININE 1.04  < > 0.98 0.93 1.10  CALCIUM 9.0  --  9.1 9.0  --   < > = values in this interval not displayed.    Recent Labs (within last 365 days)    Assessment and plan.  1 right ear irritation-appears he did have his haircut earlier today-there is some suspicion possibly a hair follicle may have gotten in his ear  causing  irritation-at this point will order flushing with warm water of the right ear if symptoms persist certainly we will need to  follow-up  #2 history of pneumonia-he appears to be stable in this regard lung exam was reassuring he is afebrile white count is within normal range at this point continue to monitor  #3 history of unresponsiveness-again no clear etiology was found the appears to be stable he is bright alert and interactive today continue to monitor for any recurrence.  [id number]  [id number]

## 2016-06-10 ENCOUNTER — Encounter (HOSPITAL_COMMUNITY)
Admission: RE | Admit: 2016-06-10 | Discharge: 2016-06-10 | Disposition: A | Discharge status: Home / Self Care | Payer: Medicare Other | Source: Skilled Nursing Facility | Attending: Internal Medicine | Admitting: Internal Medicine

## 2016-06-10 ENCOUNTER — Ambulatory Visit: Payer: Medicare Other | Admitting: Family Medicine

## 2016-06-10 LAB — CBC WITH DIFFERENTIAL/PLATELET
BASOS PCT: 0 %
Basophils Absolute: 0 10*3/uL (ref 0.0–0.1)
Eosinophils Absolute: 0.2 10*3/uL (ref 0.0–0.7)
Eosinophils Relative: 5 %
HEMATOCRIT: 39.7 % (ref 39.0–52.0)
HEMOGLOBIN: 13.3 g/dL (ref 13.0–17.0)
Lymphocytes Relative: 30 %
Lymphs Abs: 1.1 10*3/uL (ref 0.7–4.0)
MCH: 31.1 pg (ref 26.0–34.0)
MCHC: 33.5 g/dL (ref 30.0–36.0)
MCV: 92.8 fL (ref 78.0–100.0)
MONOS PCT: 10 %
Monocytes Absolute: 0.4 10*3/uL (ref 0.1–1.0)
NEUTROS ABS: 2 10*3/uL (ref 1.7–7.7)
NEUTROS PCT: 55 %
Platelets: 175 10*3/uL (ref 150–400)
RBC: 4.28 MIL/uL (ref 4.22–5.81)
RDW: 14.1 % (ref 11.5–15.5)
WBC: 3.7 10*3/uL — AB (ref 4.0–10.5)

## 2016-06-10 LAB — COMPREHENSIVE METABOLIC PANEL
ALBUMIN: 3.5 g/dL (ref 3.5–5.0)
ALT: 11 U/L — AB (ref 17–63)
ANION GAP: 9 (ref 5–15)
AST: 17 U/L (ref 15–41)
Alkaline Phosphatase: 54 U/L (ref 38–126)
BILIRUBIN TOTAL: 1.1 mg/dL (ref 0.3–1.2)
BUN: 25 mg/dL — ABNORMAL HIGH (ref 6–20)
CALCIUM: 8.8 mg/dL — AB (ref 8.9–10.3)
CO2: 26 mmol/L (ref 22–32)
Chloride: 102 mmol/L (ref 101–111)
Creatinine, Ser: 1.09 mg/dL (ref 0.61–1.24)
GFR calc Af Amer: >60 mL/min (ref >60)
GFR calc non Af Amer: 54 mL/min — ABNORMAL LOW (ref >60)
GLUCOSE: 105 mg/dL — AB (ref 65–99)
Potassium: 3.9 mmol/L (ref 3.5–5.1)
SODIUM: 137 mmol/L (ref 135–145)
TOTAL PROTEIN: 6.5 g/dL (ref 6.5–8.1)

## 2016-06-17 DIAGNOSIS — I69318 Other symptoms and signs involving cognitive functions following cerebral infarction: Secondary | ICD-10-CM | POA: Diagnosis not present

## 2016-06-17 DIAGNOSIS — I10 Essential (primary) hypertension: Secondary | ICD-10-CM | POA: Diagnosis not present

## 2016-06-17 DIAGNOSIS — I482 Chronic atrial fibrillation: Secondary | ICD-10-CM | POA: Diagnosis not present

## 2016-06-17 DIAGNOSIS — D696 Thrombocytopenia, unspecified: Secondary | ICD-10-CM | POA: Diagnosis not present

## 2016-06-23 ENCOUNTER — Ambulatory Visit: Payer: Medicare Other | Admitting: Family Medicine

## 2016-06-25 ENCOUNTER — Non-Acute Institutional Stay (SKILLED_NURSING_FACILITY): Payer: Medicare Other | Admitting: Internal Medicine

## 2016-06-25 ENCOUNTER — Encounter: Payer: Self-pay | Admitting: Internal Medicine

## 2016-06-25 DIAGNOSIS — E039 Hypothyroidism, unspecified: Secondary | ICD-10-CM

## 2016-06-25 DIAGNOSIS — D696 Thrombocytopenia, unspecified: Secondary | ICD-10-CM

## 2016-06-25 DIAGNOSIS — I639 Cerebral infarction, unspecified: Secondary | ICD-10-CM | POA: Diagnosis not present

## 2016-06-25 DIAGNOSIS — E038 Other specified hypothyroidism: Secondary | ICD-10-CM

## 2016-06-25 DIAGNOSIS — I482 Chronic atrial fibrillation, unspecified: Secondary | ICD-10-CM

## 2016-06-25 NOTE — Progress Notes (Signed)
Location:   [place] Room Number: 130/P Place of Service:  SNF (31)  Provider: Cambry Spampinato,Tracy Gerken  PCP: Sallee Lange, MD Patient Care Team: Kathyrn Drown, MD as PCP - General (Family Medicine) Herminio Commons, MD as Attending Physician (Cardiology)  Extended Emergency Contact Information Primary Emergency Contact: Maeola Sarah of [place] Phone: [phone] Relation: Daughter Secondary Emergency Contact: Pegram,[name] Address: [address]          [place], [place] [address] [place] of [place] Phone: [phone] Work Phone: [phone] Mobile Phone: [phone] Relation: Daughter  Code Status: DNR Goals of care:  Advanced Directive information Advanced Directives 06/25/2016  Does Patient Have a Medical Advance Directive? Yes  Type of Advance Directive Out of facility DNR (pink MOST or yellow form)  Does patient want to make changes to medical advance directive? No - Patient declined  Copy of [place] in Chart? -  Would patient like information on creating a medical advance directive? -     Allergies  Allergen Reactions  . Xanax [Alprazolam] Other (See Comments)    Dizzy, Sedated  . Tape Rash    PATIENT'S SKIN WILL TEAR/PLEASE EITHER USE PAPER TAPE OR COBAN WRAP!!    Chief Complaint  Patient presents with  . Discharge Note    HPI:  83 y.o. male  seen today from discharge from systolically-he will be going home with family which is very supportive.  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  He went home after hospitalization for this but continued be quite weak and thus was admitted to skilled nursing for therapy.  Last month he did go to the ER for. Of unresponsiveness with CT of the head was negative for any acute process some of the time he got to the ER apparently he was nearing his baseline CT of the chest did show  multifocal pneumonia-and he has responded to a course of Levaquin.  In regards to CVA he continues on aspirin and Eliquis.  He had some cognitive impairment that apparently has been fairly persistent since his CVA. He does have a history of hypertension and continues on a beta blocker Norvasc this appears to be stable with blood pressure systolically more in the [id number]  Range.  He also has a history of thrombocytopenia above platelets appear to be stable on lab done on July 17 with platelets of 175,000.  .  In regards to hypothyroidism he is also on Synthroid--TSH was 3.9- during hospitalization last month            Past Medical History:  Diagnosis Date  . Afib ([place])   . Atrial fibrillation ([place])   . Cerebral infarction ([place])   . Dysphagia   . Hypertension   . Hypothyroidism   . Migraine    "maybe monthly" (11/21/2015)  . Prostate cancer ([place]) ~ 1995   S/P radiation tx  . Renal mass    "slow growing something; doctors just watching it right now" (11/21/2015)  . Stroke [facility])    TIA  . Thrombocytopenia ([place])   . Thyroid disease    hypothyroid    Past Surgical History:  Procedure Laterality Date  . COLONOSCOPY  1999  . ESOPHAGOGASTRODUODENOSCOPY    . LAPAROSCOPIC CHOLECYSTECTOMY  1980s  . PROSTATE BIOPSY  ~ 1995  . TOTAL HIP ARTHROPLASTY Left 11/22/2015   Procedure: LEFT HIP HEMIARTHROPLASTY ;  Surgeon: Leandrew Koyanagi, MD;  Location:  [facility] OR;  Service: Orthopedics;  Laterality: Left;      reports that he has quit smoking. He has never used smokeless tobacco. He reports that he drinks alcohol. He reports that he does not use drugs. Social History   Social History  . Marital status: Widowed    Spouse name: N/A  . Number of children: N/A  . Years of education: N/A   Occupational History  . Not on file.   Social History Main Topics  . Smoking status: Former Research scientist (life sciences)  . Smokeless tobacco: Never Used     Comment: 11/21/2015 "might have smoked 2 packs of cigarettes  in my lifetime"  . Alcohol use Yes     Comment: 11/21/2015 "last beer I've had was in ~ 2007"  . Drug use: No  . Sexual activity: No   Other Topics Concern  . Not on file   Social History Narrative  . No narrative on file   Functional Status Survey:    Allergies  Allergen Reactions  . Xanax [Alprazolam] Other (See Comments)    Dizzy, Sedated  . Tape Rash    PATIENT'S SKIN WILL TEAR/PLEASE EITHER USE PAPER TAPE OR COBAN WRAP!!    Pertinent  Health Maintenance Due  Topic Date Due  . INFLUENZA VACCINE  Completed  . PNA vac Low Risk Adult  Completed    Medications: Current Outpatient Prescriptions on File Prior to Visit  Medication Sig Dispense Refill  . acetaminophen (TYLENOL) 325 MG tablet Take 325-650 mg by mouth every 6 (six) hours as needed for mild pain or moderate pain.    Marland Kitchen alendronate (FOSAMAX) 70 MG tablet Take 1 tablet (70 mg total) by mouth every 7 (seven) days. Take with a full glass of water on an empty stomach. 4 tablet 11  . amLODipine (NORVASC) 2.5 MG tablet Take 1 tablet (2.5 mg total) by mouth daily. 30 tablet 3  . apixaban (ELIQUIS) 5 MG TABS tablet TAKE (1) TABLET BY MOUTH TWICE DAILY. 60 tablet 5  . aspirin EC 81 MG tablet Take 1 tablet (81 mg total) by mouth daily. 30 tablet 0  . cetirizine (ZYRTEC) 10 MG tablet Take 10 mg by mouth daily.    Marland Kitchen levothyroxine (SYNTHROID, LEVOTHROID) 25 MCG tablet TAKE (1) TABLET BY MOUTH EACH MORNING. 30 tablet 5  . metoprolol succinate (TOPROL-XL) 25 MG 24 hr tablet Take 1 tablet (25 mg total) by mouth daily. 30 tablet 5  . polyethylene glycol (MIRALAX / GLYCOLAX) packet Take 17 g by mouth daily.      No current facility-administered medications on file prior to visit.      Review of Systems  This is limited since patient is a somewhat poor historian he is not speaking much-per nursing family as well.  In general no complaints of fever or chills.  Skin does not complain of rashes or itching.  Head ears eyes nose  mouth and throat does not have sore throat does not complain of visual changes.  Respiratory at times does have a cough but apparently this has improved he is not really complaining of shortness of breath routinely at times well but this is not new either.  Rx is not complaining of chest pain does have some lower extremity edema.  GI is not complaining of abdominal discomfort nausea vomiting diarrhea constipation.  GU is not complaining of dysuria.  Muscle skeletal still has weakness but appears to have gained some strength actually with prompting is able to transfer from bed to  wheelchair.  Neurologic again does have some weakness does not complain of dizziness tremors or headache.  Psych does continue to have some confusion according to family this is improved in the morning and then gradually become somewhat more confused during the day.  He does not appear overtly nervous or anxious   Vitals:   06/25/16 1216  BP: 138/60  Pulse: 66  Resp: 20  Temp: 97.4 F (36.3 C)  TempSrc: Oral  SpO2: 96%    Physical Exam   In general this is a frail elderly male in no distress he is sleeping but easily arousable fact by the end of her visit he was getting out of bed transferring to his wheelchair.  His skin is warm and dry.  Eyes pupils appear to be reactive to light he has prescription lenses.  Oropharynx clear mucous membranes moist.  Chest is clear to auscultation with somewhat shallow air entry at could not really appreciate any liver breathing.  Heart is irregular irregular rate and rhythm I would say he has trace-1+ lower extremity edema apparently is somewhat increased more when he is in the dependent position per family this has been fairly chronic since his hospitalization.  Abdomen is soft nontender positive bowel sounds.  Musculoskeletal does move all extremities 4 he can transfer to wheelchair from bed without assistance but need some prompting-possibly some residual  left-sided weakness more so lower extremitiy  Psyche is oriented to self is not talking a whole lot today per family again somewhat more confused later in the day   Labs reviewed: Basic Metabolic Panel:  Recent Labs  06/01/16 0700 06/02/16 1651 06/08/16 0730 06/10/16 0500  NA 138 138 137 137  K 4.0 4.3 3.8 3.9  CL 106 104 105 102  CO2 25  --  26 26  GLUCOSE 92 88 85 105*  BUN 24* 25* 23* 25*  CREATININE 0.93 1.10 1.09 1.09  CALCIUM 9.0  --  8.9 8.8*   Liver Function Tests:  Recent Labs  05/21/16 1726 06/01/16 0700 06/10/16 0500  AST 16 15 17   ALT 11* 12* 11*  ALKPHOS 58 52 54  BILITOT 1.3* 1.3* 1.1  PROT 6.7 6.3* 6.5  ALBUMIN 3.8 3.4* 3.5   No results for input(s): LIPASE, AMYLASE in the last 8760 hours. No results for input(s): AMMONIA in the last 8760 hours. CBC:  Recent Labs  05/21/16 1726  05/30/16 0700 06/01/16 0700 06/02/16 1651 06/08/16 0730 06/10/16 0500  WBC 3.8*  --  5.2 5.3  --  4.4 3.7*  NEUTROABS 2.4  --  2.9  --   --   --  2.0  HGB 13.2  < > 13.5 13.2 12.9* 13.3 13.3  HCT 39.6  < > 41.2 40.1 38.0* 39.9 39.7  MCV 93.4  --  94.9 93.7  --  93.2 92.8  PLT 136*  --  145* 157  --  190 175  < > = values in this interval not displayed. Cardiac Enzymes: No results for input(s): CKTOTAL, CKMB, CKMBINDEX, TROPONINI in the last 8760 hours. BNP: Invalid input(s): POCBNP CBG:  Recent Labs  06/02/16 1646  GLUCAP 74    Procedures and Imaging Studies During Stay: Ct Head Wo Contrast  Result Date: 06/02/2016 CLINICAL DATA:  Unresponsive.  Mental status changes. EXAM: CT HEAD WITHOUT CONTRAST TECHNIQUE: Contiguous axial images were obtained from the base of the skull through the vertex without intravenous contrast. COMPARISON:  Head CT 05/21/2016 FINDINGS: Brain: Stable age related cerebral atrophy, ventriculomegaly  and periventricular white matter disease. No extra-axial fluid collections are identified. No CT findings for acute hemispheric infarction  or intracranial hemorrhage. No mass lesions. The brainstem and cerebellum are normal. Vascular: Stable dolichoectasia of the basilar artery. No hyperdense vessels or definite aneurysm. Stable intracranial vascular calcifications. Skull: No skull fracture or bone lesion. Sinuses/Orbits: The paranasal sinuses and mastoid air cells are grossly clear. The globes are intact. Other: No scalp lesion or hematoma. IMPRESSION: Stable age related cerebral atrophy, ventriculomegaly and periventricular white matter disease. No acute intracranial findings or mass lesion. Electronically Signed   By: Marijo Sanes M.D.   On: 06/02/2016 18:07   Ct Chest Wo Contrast  Result Date: 06/02/2016 CLINICAL DATA:  Question pneumonia abnormal chest x-ray EXAM: CT CHEST WITHOUT CONTRAST TECHNIQUE: Multidetector CT imaging of the chest was performed following the standard protocol without IV contrast. COMPARISON:  06/02/2016 FINDINGS: Cardiovascular: Limited evaluation without the presence of intravenous contrast. Diffuse ectasia of the aorta, ascending segment measures up to 3.9 cm. Atherosclerotic calcifications are present. Extensive coronary artery calcifications. Cardiomegaly. Small amount of physiologic fluid in the superior pericardial recess. Mediastinum/Nodes: Mediastinal adenopathy is present. A right paratracheal lymph node measures 1.5 cm in short axis. There are some calcified nodes also present. Subcarinal lymph node measures 2 cm in short axis. Trachea is midline. The esophagus is grossly unremarkable. Thyroid within normal limits Lungs/Pleura: Small right-sided pleural effusion. Partial consolidation in the right lower lobe with scattered ground-glass density. Additional ground-glass density present within the right middle lobe, lingula and left lower lobe. No left effusion. No pneumothorax. Upper Abdomen: Surgical clips in the gallbladder fossa. No acute abnormality. Musculoskeletal: No chest wall mass or suspicious bone  lesions identified. IMPRESSION: 1. Small right-sided pleural effusion with consolidation and ground-glass density in the right middle, right lower, left lower lobe and lingula, consistent with multifocal pneumonia. 2. Ectasia of the aorta with atherosclerotic calcifications present 3. Mild cardiomegaly 4. Moderate mediastinal adenopathy, suspect that this is reactive Electronically Signed   By: Donavan Foil M.D.   On: 06/02/2016 19:12   Dg Op Swallowing Func-medicare/speech Path  Result Date: 06/09/2016 [place], [place], [address] Phone: [phone]   Fax:  [phone] Modified Barium Swallow Patient Details Name: Ethan Faulkner MRN: [id number] Date of Birth: 1915/06/24 No Data Recorded Encounter Date: 06/08/2016   End of Session - 06/08/16 1939   Visit Number 1  Number of Visits 1  Authorization Type Medicare  SLP Start Time [id number]  SLP Stop Time  1435  SLP Time Calculation (min) 43 min  Activity Tolerance Patient tolerated treatment well  Past Medical History: Diagnosis Date . Afib ([place])  . Atrial fibrillation ([place])  . Cerebral infarction ([place])  . Dysphagia  . Hypertension  . Hypothyroidism  . Migraine   "maybe monthly" (11/21/2015) . Prostate cancer ([place]) ~ 1995  S/P radiation tx . Renal mass   "slow growing something; doctors just watching it right now" (11/21/2015) . Stroke [facility])   TIA . Thrombocytopenia ([place])  . Thyroid disease   hypothyroid Past Surgical History: Procedure Laterality Date . COLONOSCOPY  1999 . ESOPHAGOGASTRODUODENOSCOPY   . LAPAROSCOPIC CHOLECYSTECTOMY  1980s . PROSTATE BIOPSY  ~ 1995 . TOTAL HIP ARTHROPLASTY Left 11/22/2015  Procedure: LEFT HIP HEMIARTHROPLASTY ;  Surgeon: Leandrew Koyanagi, MD;  Location: [facility];  Service: Orthopedics;  Laterality: Left; There were no vitals filed for this visit.   Subjective Assessment - 06/08/16 1921  Subjective "I'll cheer for the [employer]."  Special Tests MBSS  Currently in Pain? No/denies     General - 06/08/16 1922    General Information  Date of Onset 05/21/16  HPI Ethan Faulkner is a 83 yo male who was referred for MBSS due to suspicion of aspiration pna following a recent CVA with altered mental status. He was admitted to [facility] with an acute perforator infarct affecting the low right internal capsule + Punctate acute infarct at the median left central sulcus. He was discharged home initially, but then admitted to [facility] for rehabilitation. He has been on a D3/mech soft diet with thin liquids, but was found to have multifocal pneumonia confirmed by CT scan of the chest.  Type of Study MBS-Modified Barium Swallow Study  Previous Swallow Assessment BSE 2 weeks ago  Diet Prior to this Study Dysphagia 3 (soft);Thin liquids  Temperature Spikes Noted No  Respiratory Status Room air  History of Recent Intubation No  Behavior/Cognition Alert;Cooperative;Pleasant mood  Oral Cavity Assessment Within Functional Limits  Oral Care Completed by SLP No  Oral Cavity - Dentition Dentures, top;Dentures, bottom  Vision Functional for self feeding  Self-Feeding Abilities Able to feed self;Needs set up  Patient Positioning Upright in chair  Baseline Vocal Quality Normal;Low vocal intensity  Volitional Cough Strong  Volitional Swallow Able to elicit  Anatomy Within functional limits  Pharyngeal Secretions Not observed secondary MBS    Oral Preparation/Oral Phase - 06/08/16 1924    Oral Preparation/Oral Phase  Oral Phase Impaired   Oral - Nectar  Oral - Nectar Cup Decreased bolus cohesion residuals in posterior lateral sulcus   Oral - Thin  Oral - Thin Cup Decreased bolus cohesion;Other (Comment) residuals in posterior lateral sulcus   Oral - Solids  Oral - Puree Decreased bolus cohesion;Delayed A-P transit;Oral residue;Piecemeal swallowing  Oral - Regular Decreased bolus cohesion;Delayed A-P transit;Oral residue;Piecemeal swallowing  Oral - Pill Delayed A-P transit   Electrical stimulation - Oral Phase  Was  Electrical Stimulation Used No    Pharyngeal Phase - 06/08/16 1927    Pharyngeal Phase  Pharyngeal Phase Impaired   Pharyngeal - Nectar  Pharyngeal- Nectar Cup Swallow initiation at vallecula;Reduced epiglottic inversion;Reduced anterior laryngeal mobility;Pharyngeal residue - valleculae;Pharyngeal residue - pyriform;Lateral channel residue   Pharyngeal - Thin  Pharyngeal- Thin Cup Swallow initiation at vallecula;Reduced epiglottic inversion;Reduced anterior laryngeal mobility;Lateral channel residue;Pharyngeal residue - valleculae;Pharyngeal residue - pyriform;Penetration/Aspiration during swallow;Penetration/Apiration after swallow;Trace aspiration  Pharyngeal Material does not enter airway;Material enters airway, CONTACTS cords and then ejected out;Material enters airway, remains ABOVE vocal cords then ejected out  Pharyngeal- Thin Straw Swallow initiation at vallecula;Swallow initiation at pyriform sinus;Reduced epiglottic inversion;Reduced anterior laryngeal mobility;Reduced airway/laryngeal closure;Penetration/Aspiration during swallow;Trace aspiration;Pharyngeal residue - valleculae;Pharyngeal residue - pyriform;Lateral channel residue  Pharyngeal Material enters airway, CONTACTS cords and then ejected out;Material does not enter airway   Pharyngeal - Solids  Pharyngeal- Puree Swallow initiation at vallecula;Reduced epiglottic inversion;Reduced tongue base retraction;Pharyngeal residue - valleculae;Pharyngeal residue - pyriform;Pharyngeal residue - posterior pharnyx;Reduced pharyngeal peristalsis  Pharyngeal- Regular Delayed swallow initiation-vallecula;Reduced pharyngeal peristalsis;Reduced epiglottic inversion;Reduced tongue base retraction;Pharyngeal residue - valleculae;Pharyngeal residue - pyriform;Pharyngeal residue - posterior pharnyx  Pharyngeal- Pill -- Pt penetrated the thin when taking pill   Electrical Stimulation - Pharyngeal Phase  Was Electrical Stimulation Used No    Cricopharyngeal Phase -  06/08/16 1937    Cervical Esophageal Phase  Cervical Esophageal Phase Within functional limits    Plan - 06/08/16 1940   Clinical Impression  Statement Pt seen upright in lateral postion for MBSS and assessed with barium tinged thin, NTL, puree, regular textures, and barium tablet (12.30mm). Pt presents with mild/mod oropharyngeal dysphagia characterized by delayed oral transit with decreased anterior posterior transit, piecemeal deglutition, premature spillage with swallow trigger after filling valleculae, and decreased epiglottic deflection and tongue base retraction resulting in piecemeal deglutition, oral residue (posterior lateral buccal sulcus), mild/mod vallecular residue, mild lateral channel and pyriform residue, and trace aspiration of thins x2 with immediate expulsion (cup sip taking pill and straw sips). Trace penetration of thins occurred during the swallow on a single cup sip. Pharyngeal transit was noted to be bilateral, however pt with increased residuals noted on the LEFT>RIGHT. Head turn was inconsistently effective and not recommended. NTL were presented and not penetrated, however residuals persistent in pharynx. Verbal cues to repeat/dry swallow were effective in reducing vallecular and pyriform residue across consistencies and textures. Recommend: D3/mech soft with thin liquids via cup sip presentations, NO straws, and po meds whole in puree. Cue pt to swallow 2x for each bite/sip and clear throat periodically. If Pt able to participate in dysphagia therapy, recommend focus on effortful swallows, CTAR, and Mendelsohn to facilitate pharyngeal clearance of bolus.  Treatment/Interventions Aspiration precaution training;Diet toleration management by SLP;Pharyngeal strengthening exercises;SLP instruction and feedback;Patient/family education  Potential to Achieve Goals Fair  Potential Considerations Severity of impairments  Consulted and Agree with Plan of Care Patient  Patient will benefit from  skilled therapeutic intervention in order to improve the following deficits and impairments:  Dysphagia, oropharyngeal phase   G-Codes - 2016-07-07 1942   Functional Assessment Tool Used MBSS; clinical judgment  Functional Limitations Swallowing  Swallow Current Status [id number]) At least 20 percent but less than 40 percent impaired, limited or restricted  Swallow Goal Status [id number]) At least 20 percent but less than 40 percent impaired, limited or restricted  Swallow Discharge Status [id number]) At least 20 percent but less than 40 percent impaired, limited or restricted    Recommendations/Treatment - 2016-07-07 1938    Swallow Evaluation Recommendations  SLP Diet Recommendations Thin;Dysphagia 3 (mechanical soft)  Liquid Administration via Cup;No straw  Medication Administration Whole meds with puree  Supervision Patient able to self feed;Full supervision/cueing for compensatory strategies  Compensations Multiple dry swallows after each bite/sip;Clear throat intermittently  Postural Changes Seated upright at 90 degrees;Remain upright for at least 30 minutes after feeds/meals    Prognosis - 2016/07/07 1938    Prognosis  Prognosis for Safe Diet Advancement Fair  Barriers to Reach Goals Severity of deficits   Individuals Consulted  Consulted and Agree with Results and Recommendations Patient;Other (Comment);Family member/caregiver referring SLP  Family Member Consulted daughter  Report Sent to  Referring physician;Facility (Comment) [facility]  Problem List Patient Active Problem List  Diagnosis Date Noted . Acute CVA (cerebrovascular accident) ([place]) 05/22/2016 . TIA (transient ischemic attack) 05/21/2016 . Left leg weakness  . Leukopenia  . Thrombocytopenia ([place])  . Fall  . Acute blood loss anemia  . Femoral neck fracture ([place]) 11/20/2015 . Renal malignant tumor ([place]) 06/06/2015 . Long-term (current) use of anticoagulants 04/29/2015 . Subclinical hypothyroidism 03/01/2014 . Atrial fibrillation ([place]) 06/09/2013 . STRESS FRACTURE, FOOT  12/12/2008 . CLOSED FRACTURE OF METATARSAL BONE 12/12/2008 Thank you, Genene Churn, [facility] 07-07-2016, 7:43 PM [place] [address] [place], [place], [address] Phone: [phone]   Fax:  [phone] Name: Ethan Faulkner MRN: [id number] Date of Birth: [birth year] CLINICAL DATA:  Dysphagia, atrial  fibrillation, weakness, history stroke EXAM: MODIFIED BARIUM SWALLOW TECHNIQUE: Different consistencies of barium were administered orally to the patient by the Speech Pathologist. Imaging of the pharynx was performed in the lateral projection. FLUOROSCOPY TIME:  Fluoroscopy Time:  4 minutes 18 seconds Radiation Exposure Index (if provided by the fluoroscopic device): 18.3 mGy Number of Acquired Spot Images: multiple fluoroscopic screen captures COMPARISON:  None FINDINGS: Thin liquid- initially, no laryngeal penetration or aspiration were identified. Mild vallecular and piriform sinus residuals were seen. Flash laryngeal penetration was seen on several swallows with aspiration identified twice with thin barium, once following a cup sip and a second time following sequential swallows with straw presentation. BILATERAL piriform sinus residuals LEFT greater than RIGHT and vallecular residuals were seen which did not decreased significantly with swallows with head turned to LEFT or RIGHT. Nectar thick liquid- no laryngeal penetration or aspiration. Vallecular and piriform sinus residuals noted. Honey- not evaluated Pure- vallecular and piriform sinus residuals slightly with greater on LEFT. No laryngeal penetration or aspiration. Cracker-similar vallecular and piriform sinus residuals without laryngeal penetration or aspiration Pure with cracker- not evaluated Barium tablet - swallowed within barium, with flash penetration of the thin barium component to the level of cords but not below. No definite aspiration. Vallecular and piriform sinus residuals  noted. IMPRESSION: Swallowing dysfunction as above. Please refer to the Speech Pathologists report for complete details and recommendations. Electronically Signed   By: Lavonia Dana M.D.   On: 06/08/2016 15:56   Dg Chest Port 1 View  Result Date: 06/02/2016 CLINICAL DATA:  Altered mental status. EXAM: PORTABLE CHEST 1 VIEW COMPARISON:  One-view chest x-ray a 11/20/2015 FINDINGS: The heart is enlarged. Atherosclerotic calcifications are again noted in the aorta. Diffuse interstitial coarsening is again seen. Lung volumes are low. A right perihilar opacity is present. Right lower lobe airspace disease is noted. Minimal left basilar atelectasis is present. IMPRESSION: 1. Right perihilar and lower lobe densities. This may represent infection/pneumonia. A central obstructing mass and postobstructive pneumonitis is also considered. 2. Chronic interstitial coarsening. 3. Aortic atherosclerosis. Electronically Signed   By: San Morelle M.D.   On: 06/02/2016 17:30    Assessment/Plan:    1 history CVA-he is on Eliquis 5 mg twice a day as well as aspirin.  Will need continued PT and OT at home for further strengthening he appears to be doing relatively well- has a very supportive family any well have family with him   #2 history of atrial fibrillation continues on Eliquis rate appear to be controlled  on Toprol-XL.  #3 history of thrombocytopenia this appears stable as noted above will need updated labs next week I home health primary care provider notified of results to ensure stability.  Also has mild neutropenia 3700 on most recent lab on January 17th this will need updating as well.  #4 history of hypertension this appears stable as noted above is on Norvasc as well as the Toprol.  #5 history hypothyroidism TSH was within normal limits in the hospital at 3.9-he is on Synthroid.  #6 pneumonia apparently this has resolved unremarkably is not complaining of any increased cough or shortness of  breath  From baseline   has completed a course of Levaquin  . #7 allergic rhinitis he continues on Zyrtec routinely appears to have tolerated this well     The patient will be going home with family who is very supportive will need continued PT and OT as well as a wheelchair-    Patient  is being discharged with the following home health services will need physical therapy and occupational therapy for further strengthening with history of CVA:    Patient is being discharged with the following durable medical equipment:  He will need a wheelchair secondary to continued weakness status post CVA  Patient has been advised to f/u with their PCP in 1-2 weeks to bring them up to date on their rehab stay.  Social services at facility was responsible for arranging this appointment.  Pt was provided with a 30 day supply of prescriptions for medications and refills must be obtained from their PCP.  For controlled substances, a more limited supply may be provided adequate until PCP appointment only.  Future labs/tests needed: Will update CBC and metabolic panel next week to be drawn by home health primary care provider notified of results.  [id number] note greater than 30 minutes spent on this discharge summary-greater than 50% of time spent coordinating plan of care for numerous diagnoses

## 2016-06-26 ENCOUNTER — Telehealth: Payer: Self-pay | Admitting: Family Medicine

## 2016-06-26 NOTE — Telephone Encounter (Signed)
Hold off until f u with dr Nicki Reaper

## 2016-06-26 NOTE — Telephone Encounter (Signed)
Notified [name] hold off until follow up with Dr Nicki Reaper. Forwarded message to Dr. Nicki Reaper.

## 2016-06-26 NOTE — Telephone Encounter (Signed)
Pt was released from [facility] today and the pt's daughter stated that while he was at the [facility] they were giving him aspirin and the daughter is wanting to know if she should continue to give it to him due to Dr. Nicki Reaper taking him off of it in the past before he went to the [facility] center. Pt is aware Dr. Nicki Reaper is out but is wanting to know something today regarding this. Please advise.

## 2016-06-29 DIAGNOSIS — D72819 Decreased white blood cell count, unspecified: Secondary | ICD-10-CM | POA: Diagnosis not present

## 2016-06-29 DIAGNOSIS — I69318 Other symptoms and signs involving cognitive functions following cerebral infarction: Secondary | ICD-10-CM | POA: Diagnosis not present

## 2016-06-29 DIAGNOSIS — I482 Chronic atrial fibrillation: Secondary | ICD-10-CM | POA: Diagnosis not present

## 2016-06-29 DIAGNOSIS — I10 Essential (primary) hypertension: Secondary | ICD-10-CM | POA: Diagnosis not present

## 2016-06-29 DIAGNOSIS — N2889 Other specified disorders of kidney and ureter: Secondary | ICD-10-CM | POA: Diagnosis not present

## 2016-06-29 DIAGNOSIS — D696 Thrombocytopenia, unspecified: Secondary | ICD-10-CM | POA: Diagnosis not present

## 2016-06-30 DIAGNOSIS — N2889 Other specified disorders of kidney and ureter: Secondary | ICD-10-CM | POA: Diagnosis not present

## 2016-06-30 DIAGNOSIS — D72819 Decreased white blood cell count, unspecified: Secondary | ICD-10-CM | POA: Diagnosis not present

## 2016-06-30 DIAGNOSIS — I10 Essential (primary) hypertension: Secondary | ICD-10-CM | POA: Diagnosis not present

## 2016-06-30 DIAGNOSIS — I482 Chronic atrial fibrillation: Secondary | ICD-10-CM | POA: Diagnosis not present

## 2016-06-30 DIAGNOSIS — D696 Thrombocytopenia, unspecified: Secondary | ICD-10-CM | POA: Diagnosis not present

## 2016-06-30 DIAGNOSIS — I69318 Other symptoms and signs involving cognitive functions following cerebral infarction: Secondary | ICD-10-CM | POA: Diagnosis not present

## 2016-07-01 DIAGNOSIS — I482 Chronic atrial fibrillation: Secondary | ICD-10-CM | POA: Diagnosis not present

## 2016-07-01 DIAGNOSIS — N2889 Other specified disorders of kidney and ureter: Secondary | ICD-10-CM | POA: Diagnosis not present

## 2016-07-01 DIAGNOSIS — D72819 Decreased white blood cell count, unspecified: Secondary | ICD-10-CM | POA: Diagnosis not present

## 2016-07-01 DIAGNOSIS — I69318 Other symptoms and signs involving cognitive functions following cerebral infarction: Secondary | ICD-10-CM | POA: Diagnosis not present

## 2016-07-01 DIAGNOSIS — D696 Thrombocytopenia, unspecified: Secondary | ICD-10-CM | POA: Diagnosis not present

## 2016-07-01 DIAGNOSIS — I10 Essential (primary) hypertension: Secondary | ICD-10-CM | POA: Diagnosis not present

## 2016-07-02 ENCOUNTER — Telehealth: Payer: Self-pay | Admitting: Family Medicine

## 2016-07-02 DIAGNOSIS — I482 Chronic atrial fibrillation: Secondary | ICD-10-CM | POA: Diagnosis not present

## 2016-07-02 DIAGNOSIS — I69318 Other symptoms and signs involving cognitive functions following cerebral infarction: Secondary | ICD-10-CM | POA: Diagnosis not present

## 2016-07-02 DIAGNOSIS — I10 Essential (primary) hypertension: Secondary | ICD-10-CM | POA: Diagnosis not present

## 2016-07-02 DIAGNOSIS — D72819 Decreased white blood cell count, unspecified: Secondary | ICD-10-CM | POA: Diagnosis not present

## 2016-07-02 DIAGNOSIS — N2889 Other specified disorders of kidney and ureter: Secondary | ICD-10-CM | POA: Diagnosis not present

## 2016-07-02 DIAGNOSIS — D696 Thrombocytopenia, unspecified: Secondary | ICD-10-CM | POA: Diagnosis not present

## 2016-07-02 NOTE — Telephone Encounter (Signed)
It is fine to do this as requested please do thank you

## 2016-07-02 NOTE — Telephone Encounter (Signed)
Left message informing Ethan Faulkner with [facility] that she may have verbal order for speech therapy consult for cognition and OT 2 times weekly.

## 2016-07-02 NOTE — Telephone Encounter (Signed)
Requesting verbal order for speech therapy consult for cognition and OT 2 times weekly for 4 weeks.

## 2016-07-03 DIAGNOSIS — N2889 Other specified disorders of kidney and ureter: Secondary | ICD-10-CM | POA: Diagnosis not present

## 2016-07-03 DIAGNOSIS — I69318 Other symptoms and signs involving cognitive functions following cerebral infarction: Secondary | ICD-10-CM | POA: Diagnosis not present

## 2016-07-03 DIAGNOSIS — D696 Thrombocytopenia, unspecified: Secondary | ICD-10-CM | POA: Diagnosis not present

## 2016-07-03 DIAGNOSIS — D72819 Decreased white blood cell count, unspecified: Secondary | ICD-10-CM | POA: Diagnosis not present

## 2016-07-03 DIAGNOSIS — I10 Essential (primary) hypertension: Secondary | ICD-10-CM | POA: Diagnosis not present

## 2016-07-03 DIAGNOSIS — I482 Chronic atrial fibrillation: Secondary | ICD-10-CM | POA: Diagnosis not present

## 2016-07-06 DIAGNOSIS — I69318 Other symptoms and signs involving cognitive functions following cerebral infarction: Secondary | ICD-10-CM | POA: Diagnosis not present

## 2016-07-06 DIAGNOSIS — D72819 Decreased white blood cell count, unspecified: Secondary | ICD-10-CM | POA: Diagnosis not present

## 2016-07-06 DIAGNOSIS — I10 Essential (primary) hypertension: Secondary | ICD-10-CM | POA: Diagnosis not present

## 2016-07-06 DIAGNOSIS — D696 Thrombocytopenia, unspecified: Secondary | ICD-10-CM | POA: Diagnosis not present

## 2016-07-06 DIAGNOSIS — I482 Chronic atrial fibrillation: Secondary | ICD-10-CM | POA: Diagnosis not present

## 2016-07-06 DIAGNOSIS — N2889 Other specified disorders of kidney and ureter: Secondary | ICD-10-CM | POA: Diagnosis not present

## 2016-07-07 DIAGNOSIS — I10 Essential (primary) hypertension: Secondary | ICD-10-CM | POA: Diagnosis not present

## 2016-07-07 DIAGNOSIS — I482 Chronic atrial fibrillation: Secondary | ICD-10-CM | POA: Diagnosis not present

## 2016-07-07 DIAGNOSIS — D72819 Decreased white blood cell count, unspecified: Secondary | ICD-10-CM | POA: Diagnosis not present

## 2016-07-07 DIAGNOSIS — N2889 Other specified disorders of kidney and ureter: Secondary | ICD-10-CM | POA: Diagnosis not present

## 2016-07-07 DIAGNOSIS — D696 Thrombocytopenia, unspecified: Secondary | ICD-10-CM | POA: Diagnosis not present

## 2016-07-07 DIAGNOSIS — I69318 Other symptoms and signs involving cognitive functions following cerebral infarction: Secondary | ICD-10-CM | POA: Diagnosis not present

## 2016-07-08 ENCOUNTER — Encounter: Payer: Self-pay | Admitting: Family Medicine

## 2016-07-08 ENCOUNTER — Ambulatory Visit (INDEPENDENT_AMBULATORY_CARE_PROVIDER_SITE_OTHER): Payer: Medicare Other | Admitting: Family Medicine

## 2016-07-08 VITALS — BP 116/80 | Ht 70.0 in | Wt 156.2 lb

## 2016-07-08 DIAGNOSIS — I482 Chronic atrial fibrillation, unspecified: Secondary | ICD-10-CM

## 2016-07-08 DIAGNOSIS — I10 Essential (primary) hypertension: Secondary | ICD-10-CM | POA: Diagnosis not present

## 2016-07-08 DIAGNOSIS — I639 Cerebral infarction, unspecified: Secondary | ICD-10-CM | POA: Diagnosis not present

## 2016-07-08 DIAGNOSIS — C642 Malignant neoplasm of left kidney, except renal pelvis: Secondary | ICD-10-CM

## 2016-07-08 DIAGNOSIS — R29898 Other symptoms and signs involving the musculoskeletal system: Secondary | ICD-10-CM | POA: Diagnosis not present

## 2016-07-08 DIAGNOSIS — E039 Hypothyroidism, unspecified: Secondary | ICD-10-CM

## 2016-07-08 DIAGNOSIS — D696 Thrombocytopenia, unspecified: Secondary | ICD-10-CM | POA: Diagnosis not present

## 2016-07-08 DIAGNOSIS — D72819 Decreased white blood cell count, unspecified: Secondary | ICD-10-CM | POA: Diagnosis not present

## 2016-07-08 DIAGNOSIS — I69318 Other symptoms and signs involving cognitive functions following cerebral infarction: Secondary | ICD-10-CM | POA: Diagnosis not present

## 2016-07-08 DIAGNOSIS — E038 Other specified hypothyroidism: Secondary | ICD-10-CM

## 2016-07-08 DIAGNOSIS — N2889 Other specified disorders of kidney and ureter: Secondary | ICD-10-CM | POA: Diagnosis not present

## 2016-07-08 MED ORDER — METOPROLOL SUCCINATE ER 25 MG PO TB24
25.0000 mg | ORAL_TABLET | Freq: Every day | ORAL | 5 refills | Status: DC
Start: 2016-07-08 — End: 2017-02-19

## 2016-07-08 MED ORDER — LEVOTHYROXINE SODIUM 25 MCG PO TABS: ORAL_TABLET | ORAL | 5 refills | Status: DC

## 2016-07-08 MED ORDER — AMLODIPINE BESYLATE 2.5 MG PO TABS: 2.5000 mg | ORAL_TABLET | Freq: Every day | ORAL | 5 refills | Status: DC

## 2016-07-08 MED ORDER — APIXABAN 5 MG PO TABS: ORAL_TABLET | ORAL | 5 refills | Status: DC

## 2016-07-08 NOTE — Progress Notes (Signed)
   Subjective:    Patient ID: Ethan Faulkner, male    DOB: [birth year], 83 y.o.   MRN: [id number]  HPI Patient in today for a follow up post Rehab center.  This patient was in the hospital because of stroke actually had 2 small strokes. Cause left side weakness. Has also cause some lack of interests in some fatigue. He does not state he is depressed and denies being depressed. Patient's daughters states no concern this visit.  Patient denies any high fever chills sweats denies dysuria rectal bleeding hematuria. Patient has atrial fibrillation and is on blood thinner denies any bleeding issues Patient has had a recent stroke has some residual weakness trying to prevent this with his blood thinner. Patient has had ongoing trouble with thrombocytopenia Also has a history of renal cell cancer that the family is chose just to watch Patient denies any headaches. Is using a walker to get around. Review of Systems Currently denies any chest tightness pressure pain shortness breath nausea vomiting diarrhea    Objective:   Physical Exam Heart irregular but rate is controlled lungs are clear no crackles extremities no edema skin warm dry blood pressure taken sitting and standing slight orthostasis  25 minutes was spent with the patient. Greater than half the time was spent in discussion and answering questions and counseling regarding the issues that the patient came in for today.      Assessment & Plan:  Patient is at risk of falling I recommend ongoing physical therapy occupational therapy Patient is recovering from the stroke Blood pressure good control continue current measures Stroke prevention continue Eliquis. Stop aspirin. Patient has thyroid issue continue medication Patient has history of thrombocytopenia will be checking lab work Home health will be doing lab work and sending [place] a results

## 2016-07-09 ENCOUNTER — Encounter: Payer: Self-pay | Admitting: Family Medicine

## 2016-07-09 DIAGNOSIS — D72819 Decreased white blood cell count, unspecified: Secondary | ICD-10-CM | POA: Diagnosis not present

## 2016-07-09 DIAGNOSIS — I10 Essential (primary) hypertension: Secondary | ICD-10-CM | POA: Diagnosis not present

## 2016-07-09 DIAGNOSIS — Z8546 Personal history of malignant neoplasm of prostate: Secondary | ICD-10-CM | POA: Diagnosis not present

## 2016-07-09 DIAGNOSIS — N2889 Other specified disorders of kidney and ureter: Secondary | ICD-10-CM | POA: Diagnosis not present

## 2016-07-09 DIAGNOSIS — I482 Chronic atrial fibrillation: Secondary | ICD-10-CM | POA: Diagnosis not present

## 2016-07-09 DIAGNOSIS — I69318 Other symptoms and signs involving cognitive functions following cerebral infarction: Secondary | ICD-10-CM | POA: Diagnosis not present

## 2016-07-09 DIAGNOSIS — D696 Thrombocytopenia, unspecified: Secondary | ICD-10-CM | POA: Diagnosis not present

## 2016-07-09 DIAGNOSIS — E039 Hypothyroidism, unspecified: Secondary | ICD-10-CM | POA: Diagnosis not present

## 2016-07-10 DIAGNOSIS — N2889 Other specified disorders of kidney and ureter: Secondary | ICD-10-CM | POA: Diagnosis not present

## 2016-07-10 DIAGNOSIS — I69318 Other symptoms and signs involving cognitive functions following cerebral infarction: Secondary | ICD-10-CM | POA: Diagnosis not present

## 2016-07-10 DIAGNOSIS — D696 Thrombocytopenia, unspecified: Secondary | ICD-10-CM | POA: Diagnosis not present

## 2016-07-10 DIAGNOSIS — I10 Essential (primary) hypertension: Secondary | ICD-10-CM | POA: Diagnosis not present

## 2016-07-10 DIAGNOSIS — I482 Chronic atrial fibrillation: Secondary | ICD-10-CM | POA: Diagnosis not present

## 2016-07-10 DIAGNOSIS — D72819 Decreased white blood cell count, unspecified: Secondary | ICD-10-CM | POA: Diagnosis not present

## 2016-07-13 ENCOUNTER — Telehealth: Payer: Self-pay | Admitting: Family Medicine

## 2016-07-13 DIAGNOSIS — I482 Chronic atrial fibrillation: Secondary | ICD-10-CM | POA: Diagnosis not present

## 2016-07-13 DIAGNOSIS — I69318 Other symptoms and signs involving cognitive functions following cerebral infarction: Secondary | ICD-10-CM | POA: Diagnosis not present

## 2016-07-13 DIAGNOSIS — N2889 Other specified disorders of kidney and ureter: Secondary | ICD-10-CM | POA: Diagnosis not present

## 2016-07-13 DIAGNOSIS — D72819 Decreased white blood cell count, unspecified: Secondary | ICD-10-CM | POA: Diagnosis not present

## 2016-07-13 DIAGNOSIS — I10 Essential (primary) hypertension: Secondary | ICD-10-CM | POA: Diagnosis not present

## 2016-07-13 DIAGNOSIS — D696 Thrombocytopenia, unspecified: Secondary | ICD-10-CM | POA: Diagnosis not present

## 2016-07-13 NOTE — Telephone Encounter (Signed)
Review lab results faxed over from [place] in results folder.

## 2016-07-14 ENCOUNTER — Ambulatory Visit: Payer: Medicare Other | Admitting: Family Medicine

## 2016-07-14 DIAGNOSIS — D696 Thrombocytopenia, unspecified: Secondary | ICD-10-CM | POA: Diagnosis not present

## 2016-07-14 DIAGNOSIS — I482 Chronic atrial fibrillation: Secondary | ICD-10-CM | POA: Diagnosis not present

## 2016-07-14 DIAGNOSIS — D72819 Decreased white blood cell count, unspecified: Secondary | ICD-10-CM | POA: Diagnosis not present

## 2016-07-14 DIAGNOSIS — I10 Essential (primary) hypertension: Secondary | ICD-10-CM | POA: Diagnosis not present

## 2016-07-14 DIAGNOSIS — N2889 Other specified disorders of kidney and ureter: Secondary | ICD-10-CM | POA: Diagnosis not present

## 2016-07-14 DIAGNOSIS — I69318 Other symptoms and signs involving cognitive functions following cerebral infarction: Secondary | ICD-10-CM | POA: Diagnosis not present

## 2016-07-15 DIAGNOSIS — I482 Chronic atrial fibrillation: Secondary | ICD-10-CM | POA: Diagnosis not present

## 2016-07-15 DIAGNOSIS — I69318 Other symptoms and signs involving cognitive functions following cerebral infarction: Secondary | ICD-10-CM | POA: Diagnosis not present

## 2016-07-15 DIAGNOSIS — I10 Essential (primary) hypertension: Secondary | ICD-10-CM | POA: Diagnosis not present

## 2016-07-15 DIAGNOSIS — N2889 Other specified disorders of kidney and ureter: Secondary | ICD-10-CM | POA: Diagnosis not present

## 2016-07-15 DIAGNOSIS — D696 Thrombocytopenia, unspecified: Secondary | ICD-10-CM | POA: Diagnosis not present

## 2016-07-15 DIAGNOSIS — D72819 Decreased white blood cell count, unspecified: Secondary | ICD-10-CM | POA: Diagnosis not present

## 2016-07-15 NOTE — Telephone Encounter (Signed)
Discussed with pt's daughter [name].

## 2016-07-15 NOTE — Telephone Encounter (Signed)
Please let the caretakers /patient know that the results overall look good. I would stick with the current medicines. His he is not anemic. His kidney function looks good. Please keep follow-up in April. Certainly if needs to be seen before then we will be happy to do so.(His daughters are often the primary information caretakers for him)

## 2016-07-16 ENCOUNTER — Telehealth: Payer: Self-pay | Admitting: Family Medicine

## 2016-07-16 DIAGNOSIS — D72819 Decreased white blood cell count, unspecified: Secondary | ICD-10-CM | POA: Diagnosis not present

## 2016-07-16 DIAGNOSIS — N2889 Other specified disorders of kidney and ureter: Secondary | ICD-10-CM | POA: Diagnosis not present

## 2016-07-16 DIAGNOSIS — I10 Essential (primary) hypertension: Secondary | ICD-10-CM | POA: Diagnosis not present

## 2016-07-16 DIAGNOSIS — I69318 Other symptoms and signs involving cognitive functions following cerebral infarction: Secondary | ICD-10-CM | POA: Diagnosis not present

## 2016-07-16 DIAGNOSIS — I482 Chronic atrial fibrillation: Secondary | ICD-10-CM | POA: Diagnosis not present

## 2016-07-16 DIAGNOSIS — D696 Thrombocytopenia, unspecified: Secondary | ICD-10-CM | POA: Diagnosis not present

## 2016-07-16 NOTE — Telephone Encounter (Signed)
Letter written in your folder. Just needs signature if you agree with letter.

## 2016-07-16 NOTE — Telephone Encounter (Signed)
Pt's daughter needs Dr. Nicki Reaper to do a letter for her to turn in to the social security office Specifics are :  Patient's name, DOB, date of last OV, and to explain that the patient is no longer able to handle his social security needs.  Also needs to state that his POA - daughter [name] handles his affairs Letter must be signed by the doctor, a stamp is not accepted  Please advise

## 2016-07-17 DIAGNOSIS — D696 Thrombocytopenia, unspecified: Secondary | ICD-10-CM | POA: Diagnosis not present

## 2016-07-17 DIAGNOSIS — I10 Essential (primary) hypertension: Secondary | ICD-10-CM | POA: Diagnosis not present

## 2016-07-17 DIAGNOSIS — D72819 Decreased white blood cell count, unspecified: Secondary | ICD-10-CM | POA: Diagnosis not present

## 2016-07-17 DIAGNOSIS — N2889 Other specified disorders of kidney and ureter: Secondary | ICD-10-CM | POA: Diagnosis not present

## 2016-07-17 DIAGNOSIS — I69318 Other symptoms and signs involving cognitive functions following cerebral infarction: Secondary | ICD-10-CM | POA: Diagnosis not present

## 2016-07-17 DIAGNOSIS — I482 Chronic atrial fibrillation: Secondary | ICD-10-CM | POA: Diagnosis not present

## 2016-07-17 NOTE — Telephone Encounter (Signed)
The letter was filled in. I agree that the patient is no longer able to handle his Social Security I also agree that his daughter will do a good job

## 2016-07-20 DIAGNOSIS — N2889 Other specified disorders of kidney and ureter: Secondary | ICD-10-CM | POA: Diagnosis not present

## 2016-07-20 DIAGNOSIS — I10 Essential (primary) hypertension: Secondary | ICD-10-CM | POA: Diagnosis not present

## 2016-07-20 DIAGNOSIS — D72819 Decreased white blood cell count, unspecified: Secondary | ICD-10-CM | POA: Diagnosis not present

## 2016-07-20 DIAGNOSIS — I69318 Other symptoms and signs involving cognitive functions following cerebral infarction: Secondary | ICD-10-CM | POA: Diagnosis not present

## 2016-07-20 DIAGNOSIS — D696 Thrombocytopenia, unspecified: Secondary | ICD-10-CM | POA: Diagnosis not present

## 2016-07-20 DIAGNOSIS — I482 Chronic atrial fibrillation: Secondary | ICD-10-CM | POA: Diagnosis not present

## 2016-07-21 DIAGNOSIS — I482 Chronic atrial fibrillation: Secondary | ICD-10-CM | POA: Diagnosis not present

## 2016-07-21 DIAGNOSIS — D696 Thrombocytopenia, unspecified: Secondary | ICD-10-CM | POA: Diagnosis not present

## 2016-07-21 DIAGNOSIS — I69318 Other symptoms and signs involving cognitive functions following cerebral infarction: Secondary | ICD-10-CM | POA: Diagnosis not present

## 2016-07-21 DIAGNOSIS — I10 Essential (primary) hypertension: Secondary | ICD-10-CM | POA: Diagnosis not present

## 2016-07-21 DIAGNOSIS — D72819 Decreased white blood cell count, unspecified: Secondary | ICD-10-CM | POA: Diagnosis not present

## 2016-07-21 DIAGNOSIS — N2889 Other specified disorders of kidney and ureter: Secondary | ICD-10-CM | POA: Diagnosis not present

## 2016-07-21 NOTE — Telephone Encounter (Signed)
Pt's daughter called to check to see if letter was ready, went up front and letter is ready for pick up however pt's daughter was not notified   Notified daughter that letter is ready, states she will pick up and thanked me for all my help

## 2016-07-22 DIAGNOSIS — N2889 Other specified disorders of kidney and ureter: Secondary | ICD-10-CM | POA: Diagnosis not present

## 2016-07-22 DIAGNOSIS — D696 Thrombocytopenia, unspecified: Secondary | ICD-10-CM | POA: Diagnosis not present

## 2016-07-22 DIAGNOSIS — I482 Chronic atrial fibrillation: Secondary | ICD-10-CM | POA: Diagnosis not present

## 2016-07-22 DIAGNOSIS — I10 Essential (primary) hypertension: Secondary | ICD-10-CM | POA: Diagnosis not present

## 2016-07-22 DIAGNOSIS — I69318 Other symptoms and signs involving cognitive functions following cerebral infarction: Secondary | ICD-10-CM | POA: Diagnosis not present

## 2016-07-22 DIAGNOSIS — D72819 Decreased white blood cell count, unspecified: Secondary | ICD-10-CM | POA: Diagnosis not present

## 2016-07-23 DIAGNOSIS — I10 Essential (primary) hypertension: Secondary | ICD-10-CM | POA: Diagnosis not present

## 2016-07-23 DIAGNOSIS — N2889 Other specified disorders of kidney and ureter: Secondary | ICD-10-CM | POA: Diagnosis not present

## 2016-07-23 DIAGNOSIS — I69318 Other symptoms and signs involving cognitive functions following cerebral infarction: Secondary | ICD-10-CM | POA: Diagnosis not present

## 2016-07-23 DIAGNOSIS — D696 Thrombocytopenia, unspecified: Secondary | ICD-10-CM | POA: Diagnosis not present

## 2016-07-23 DIAGNOSIS — D72819 Decreased white blood cell count, unspecified: Secondary | ICD-10-CM | POA: Diagnosis not present

## 2016-07-23 DIAGNOSIS — I482 Chronic atrial fibrillation: Secondary | ICD-10-CM | POA: Diagnosis not present

## 2016-07-24 DIAGNOSIS — D696 Thrombocytopenia, unspecified: Secondary | ICD-10-CM | POA: Diagnosis not present

## 2016-07-24 DIAGNOSIS — D72819 Decreased white blood cell count, unspecified: Secondary | ICD-10-CM | POA: Diagnosis not present

## 2016-07-24 DIAGNOSIS — I69318 Other symptoms and signs involving cognitive functions following cerebral infarction: Secondary | ICD-10-CM | POA: Diagnosis not present

## 2016-07-24 DIAGNOSIS — N2889 Other specified disorders of kidney and ureter: Secondary | ICD-10-CM | POA: Diagnosis not present

## 2016-07-24 DIAGNOSIS — I482 Chronic atrial fibrillation: Secondary | ICD-10-CM | POA: Diagnosis not present

## 2016-07-24 DIAGNOSIS — I10 Essential (primary) hypertension: Secondary | ICD-10-CM | POA: Diagnosis not present

## 2016-07-26 DIAGNOSIS — Z8546 Personal history of malignant neoplasm of prostate: Secondary | ICD-10-CM | POA: Diagnosis not present

## 2016-07-26 DIAGNOSIS — I69398 Other sequelae of cerebral infarction: Secondary | ICD-10-CM | POA: Diagnosis not present

## 2016-07-26 DIAGNOSIS — I69318 Other symptoms and signs involving cognitive functions following cerebral infarction: Secondary | ICD-10-CM | POA: Diagnosis not present

## 2016-07-26 DIAGNOSIS — N2889 Other specified disorders of kidney and ureter: Secondary | ICD-10-CM | POA: Diagnosis not present

## 2016-07-26 DIAGNOSIS — Z96642 Presence of left artificial hip joint: Secondary | ICD-10-CM | POA: Diagnosis not present

## 2016-07-26 DIAGNOSIS — D72819 Decreased white blood cell count, unspecified: Secondary | ICD-10-CM | POA: Diagnosis not present

## 2016-07-26 DIAGNOSIS — D696 Thrombocytopenia, unspecified: Secondary | ICD-10-CM | POA: Diagnosis not present

## 2016-07-26 DIAGNOSIS — I10 Essential (primary) hypertension: Secondary | ICD-10-CM | POA: Diagnosis not present

## 2016-07-26 DIAGNOSIS — M6281 Muscle weakness (generalized): Secondary | ICD-10-CM | POA: Diagnosis not present

## 2016-07-26 DIAGNOSIS — I482 Chronic atrial fibrillation: Secondary | ICD-10-CM | POA: Diagnosis not present

## 2016-07-26 DIAGNOSIS — E039 Hypothyroidism, unspecified: Secondary | ICD-10-CM | POA: Diagnosis not present

## 2016-07-27 DIAGNOSIS — I69398 Other sequelae of cerebral infarction: Secondary | ICD-10-CM | POA: Diagnosis not present

## 2016-07-27 DIAGNOSIS — M6281 Muscle weakness (generalized): Secondary | ICD-10-CM | POA: Diagnosis not present

## 2016-07-27 DIAGNOSIS — I482 Chronic atrial fibrillation: Secondary | ICD-10-CM | POA: Diagnosis not present

## 2016-07-27 DIAGNOSIS — I69318 Other symptoms and signs involving cognitive functions following cerebral infarction: Secondary | ICD-10-CM | POA: Diagnosis not present

## 2016-07-27 DIAGNOSIS — D696 Thrombocytopenia, unspecified: Secondary | ICD-10-CM | POA: Diagnosis not present

## 2016-07-27 DIAGNOSIS — I10 Essential (primary) hypertension: Secondary | ICD-10-CM | POA: Diagnosis not present

## 2016-07-28 DIAGNOSIS — I69318 Other symptoms and signs involving cognitive functions following cerebral infarction: Secondary | ICD-10-CM | POA: Diagnosis not present

## 2016-07-28 DIAGNOSIS — M6281 Muscle weakness (generalized): Secondary | ICD-10-CM | POA: Diagnosis not present

## 2016-07-28 DIAGNOSIS — I10 Essential (primary) hypertension: Secondary | ICD-10-CM | POA: Diagnosis not present

## 2016-07-28 DIAGNOSIS — I69398 Other sequelae of cerebral infarction: Secondary | ICD-10-CM | POA: Diagnosis not present

## 2016-07-31 DIAGNOSIS — I69318 Other symptoms and signs involving cognitive functions following cerebral infarction: Secondary | ICD-10-CM | POA: Diagnosis not present

## 2016-07-31 DIAGNOSIS — M6281 Muscle weakness (generalized): Secondary | ICD-10-CM | POA: Diagnosis not present

## 2016-07-31 DIAGNOSIS — I482 Chronic atrial fibrillation: Secondary | ICD-10-CM | POA: Diagnosis not present

## 2016-07-31 DIAGNOSIS — I69398 Other sequelae of cerebral infarction: Secondary | ICD-10-CM | POA: Diagnosis not present

## 2016-07-31 DIAGNOSIS — I10 Essential (primary) hypertension: Secondary | ICD-10-CM | POA: Diagnosis not present

## 2016-07-31 DIAGNOSIS — D696 Thrombocytopenia, unspecified: Secondary | ICD-10-CM | POA: Diagnosis not present

## 2016-08-03 DIAGNOSIS — I69318 Other symptoms and signs involving cognitive functions following cerebral infarction: Secondary | ICD-10-CM | POA: Diagnosis not present

## 2016-08-03 DIAGNOSIS — I10 Essential (primary) hypertension: Secondary | ICD-10-CM | POA: Diagnosis not present

## 2016-08-03 DIAGNOSIS — I482 Chronic atrial fibrillation: Secondary | ICD-10-CM | POA: Diagnosis not present

## 2016-08-03 DIAGNOSIS — I69398 Other sequelae of cerebral infarction: Secondary | ICD-10-CM | POA: Diagnosis not present

## 2016-08-03 DIAGNOSIS — D696 Thrombocytopenia, unspecified: Secondary | ICD-10-CM | POA: Diagnosis not present

## 2016-08-03 DIAGNOSIS — M6281 Muscle weakness (generalized): Secondary | ICD-10-CM | POA: Diagnosis not present

## 2016-08-06 DIAGNOSIS — I10 Essential (primary) hypertension: Secondary | ICD-10-CM | POA: Diagnosis not present

## 2016-08-06 DIAGNOSIS — M6281 Muscle weakness (generalized): Secondary | ICD-10-CM | POA: Diagnosis not present

## 2016-08-06 DIAGNOSIS — I482 Chronic atrial fibrillation: Secondary | ICD-10-CM | POA: Diagnosis not present

## 2016-08-06 DIAGNOSIS — I69318 Other symptoms and signs involving cognitive functions following cerebral infarction: Secondary | ICD-10-CM | POA: Diagnosis not present

## 2016-08-06 DIAGNOSIS — D696 Thrombocytopenia, unspecified: Secondary | ICD-10-CM | POA: Diagnosis not present

## 2016-08-06 DIAGNOSIS — I69398 Other sequelae of cerebral infarction: Secondary | ICD-10-CM | POA: Diagnosis not present

## 2016-08-10 DIAGNOSIS — I69398 Other sequelae of cerebral infarction: Secondary | ICD-10-CM | POA: Diagnosis not present

## 2016-08-10 DIAGNOSIS — I69318 Other symptoms and signs involving cognitive functions following cerebral infarction: Secondary | ICD-10-CM | POA: Diagnosis not present

## 2016-08-10 DIAGNOSIS — I10 Essential (primary) hypertension: Secondary | ICD-10-CM | POA: Diagnosis not present

## 2016-08-10 DIAGNOSIS — D696 Thrombocytopenia, unspecified: Secondary | ICD-10-CM | POA: Diagnosis not present

## 2016-08-10 DIAGNOSIS — M6281 Muscle weakness (generalized): Secondary | ICD-10-CM | POA: Diagnosis not present

## 2016-08-10 DIAGNOSIS — I482 Chronic atrial fibrillation: Secondary | ICD-10-CM | POA: Diagnosis not present

## 2016-08-13 DIAGNOSIS — D696 Thrombocytopenia, unspecified: Secondary | ICD-10-CM | POA: Diagnosis not present

## 2016-08-13 DIAGNOSIS — I10 Essential (primary) hypertension: Secondary | ICD-10-CM | POA: Diagnosis not present

## 2016-08-13 DIAGNOSIS — I69398 Other sequelae of cerebral infarction: Secondary | ICD-10-CM | POA: Diagnosis not present

## 2016-08-13 DIAGNOSIS — I69318 Other symptoms and signs involving cognitive functions following cerebral infarction: Secondary | ICD-10-CM | POA: Diagnosis not present

## 2016-08-13 DIAGNOSIS — I482 Chronic atrial fibrillation: Secondary | ICD-10-CM | POA: Diagnosis not present

## 2016-08-13 DIAGNOSIS — M6281 Muscle weakness (generalized): Secondary | ICD-10-CM | POA: Diagnosis not present

## 2016-08-18 ENCOUNTER — Other Ambulatory Visit: Payer: Self-pay | Admitting: Family Medicine

## 2016-09-03 ENCOUNTER — Encounter: Payer: Self-pay | Admitting: Family Medicine

## 2016-09-03 ENCOUNTER — Ambulatory Visit (INDEPENDENT_AMBULATORY_CARE_PROVIDER_SITE_OTHER): Payer: Medicare Other | Admitting: Family Medicine

## 2016-09-03 VITALS — BP 130/78 | Ht 70.0 in | Wt 153.2 lb

## 2016-09-03 DIAGNOSIS — I482 Chronic atrial fibrillation, unspecified: Secondary | ICD-10-CM

## 2016-09-03 DIAGNOSIS — C642 Malignant neoplasm of left kidney, except renal pelvis: Secondary | ICD-10-CM | POA: Diagnosis not present

## 2016-09-03 DIAGNOSIS — J301 Allergic rhinitis due to pollen: Secondary | ICD-10-CM

## 2016-09-03 DIAGNOSIS — I639 Cerebral infarction, unspecified: Secondary | ICD-10-CM | POA: Diagnosis not present

## 2016-09-03 DIAGNOSIS — E039 Hypothyroidism, unspecified: Secondary | ICD-10-CM | POA: Diagnosis not present

## 2016-09-03 DIAGNOSIS — E038 Other specified hypothyroidism: Secondary | ICD-10-CM

## 2016-09-03 NOTE — Progress Notes (Signed)
   Subjective:    Patient ID: Ethan Faulkner, male    DOB: [birth year], 83 y.o.   MRN: [id number]  HPI  Patient in today for a 2 month follow up for physical therapy. Patient had some falls recently but currently is doing physical therapy and is getting stronger but he still has some ataxia. He is not had any serious falls recently. States no other concerns this visit Has history of atrial fibrillation a history of falls ataxia weakness went through some physical therapy history of anemia but doing better now not having any bleeding spells from Eliquis.  Review of Systems  Constitutional: Negative for activity change, fatigue and fever.  Respiratory: Negative for cough and shortness of breath.   Cardiovascular: Negative for chest pain and leg swelling.  Neurological: Negative for headaches.   Family relates that at times he seems to be cocking about things that did not occur or having difficult time grasping the current moment.  When taking into account patient's weight creatinine in age Eliquis 5 mg twice a day is the proper dose    Objective:   Physical Exam  Constitutional: He appears well-nourished. No distress.  Cardiovascular: Normal rate and regular rhythm.   No murmur heard. Pulmonary/Chest: Effort normal and breath sounds normal. No respiratory distress.  Musculoskeletal: He exhibits no edema.  Lymphadenopathy:    He has no cervical adenopathy.  Neurological: He is alert.  Psychiatric: His behavior is normal.  Vitals reviewed. 25 minutes spent with the son and with the patient discussing his healthcare issues listening to the family and patient and answering questions greater than half in consultation  Occasionally having some cognitive disconnect from the moment but not having hallucinations. Note no agitation  Patient has renal cancer for which family has chosen not to do surgery there is been no hematuria no flank pain no weight loss appetite has been good. We will  continue to follow.    Assessment & Plan:  Patient has chronic atrial fibrillation but no sign of any bleeding issues Subclinical hypothyroidism on thyroid medicine we'll repeat TSH in several months I am satisfied currently with dosing but we may have to increase the dose if TSH goes up  Ataxia of using a walker but having increased risk of falling  I am concerned about progressive dementia but this is age-related I do not feel Namenda or other similar medicine will help  Claritin 10 mg 1 daily for allergy related issues  Follow-up 3 months

## 2016-09-18 ENCOUNTER — Telehealth: Payer: Self-pay | Admitting: Family Medicine

## 2016-09-18 NOTE — Telephone Encounter (Signed)
Patients ears are clogged up.  Ethan Faulkner says that at one point, Dr. Nicki Reaper prescribed an Rx for ear drops and she is wanting to know if he can do this again.  [name]

## 2016-09-21 NOTE — Telephone Encounter (Signed)
Spoke with patient's daughter and informed her per [name]- Debrox over the counter helps loosen wax.patients daughter verbalized understanding.

## 2016-09-21 NOTE — Telephone Encounter (Signed)
Nurses please assist-dobrox? I believe it's over-the-counter helps to loosen wax (please discuss with Autumn -hopefully she remembers the name of this drop thanks

## 2016-09-21 NOTE — Telephone Encounter (Signed)
Ethan Faulkner called to check on this message.  She is hoping for a call back today.

## 2016-10-26 ENCOUNTER — Encounter: Payer: Self-pay | Admitting: Family Medicine

## 2016-10-26 ENCOUNTER — Ambulatory Visit (HOSPITAL_COMMUNITY)
Admission: RE | Admit: 2016-10-26 | Discharge: 2016-10-26 | Disposition: A | Discharge status: Home / Self Care | Payer: Medicare Other | Source: Ambulatory Visit | Attending: Family Medicine | Admitting: Family Medicine

## 2016-10-26 ENCOUNTER — Ambulatory Visit (INDEPENDENT_AMBULATORY_CARE_PROVIDER_SITE_OTHER): Payer: Medicare Other | Admitting: Family Medicine

## 2016-10-26 VITALS — BP 118/80 | Temp 97.5°F | Wt 162.0 lb

## 2016-10-26 DIAGNOSIS — I700 Atherosclerosis of aorta: Secondary | ICD-10-CM | POA: Insufficient documentation

## 2016-10-26 DIAGNOSIS — M545 Low back pain: Secondary | ICD-10-CM | POA: Insufficient documentation

## 2016-10-26 DIAGNOSIS — R309 Painful micturition, unspecified: Secondary | ICD-10-CM | POA: Diagnosis not present

## 2016-10-26 DIAGNOSIS — M5136 Other intervertebral disc degeneration, lumbar region: Secondary | ICD-10-CM | POA: Diagnosis not present

## 2016-10-26 DIAGNOSIS — M419 Scoliosis, unspecified: Secondary | ICD-10-CM | POA: Diagnosis not present

## 2016-10-26 DIAGNOSIS — I639 Cerebral infarction, unspecified: Secondary | ICD-10-CM

## 2016-10-26 DIAGNOSIS — M8588 Other specified disorders of bone density and structure, other site: Secondary | ICD-10-CM | POA: Diagnosis not present

## 2016-10-26 LAB — POCT URINALYSIS DIPSTICK
LEUKOCYTES UA: NEGATIVE
PH UA: 5 (ref 5.0–8.0)
SPEC GRAV UA: 1.025 (ref 1.010–1.025)

## 2016-10-26 MED ORDER — HYDROCODONE-ACETAMINOPHEN 5-325 MG PO TABS: ORAL_TABLET | ORAL | 0 refills | Status: DC

## 2016-10-26 NOTE — Progress Notes (Signed)
   Subjective:    Patient ID: Ethan Faulkner, male    DOB: [birth year], 83 y.o.   MRN: [id number] Patient arrives office with numerous concerns Urinary Tract Infection   This is a new problem. The current episode started in the past 7 days. Associated symptoms comments: Back Pain.  Back pain is lumbar primarily. Minimal radiation. Worse with movement. Not helped by Tylenol. Complicated by tendency towards sedation   Low bl [aon [rgresvely worse last few day   Also bowels on the loose side no vomiting. No fever.  Using PEG supplement   orig startef for constipation Patient also has concerns of diarrhea, and being unable to control bowels.   Results for orders placed or performed in visit on 10/26/16  POCT urinalysis dipstick  Result Value Ref Range   Color, UA Yellow    Clarity, UA Clear    Glucose, UA     Bilirubin, UA ++    Ketones, UA     Spec Grav, UA 1.025 1.010 - 1.025   Blood, UA moderate    pH, UA 5.0 5.0 - 8.0   Protein, UA     Urobilinogen, UA  0.2 or 1.0 E.U./dL   Nitrite, UA     Leukocytes, UA Negative Negative   Still good appetite  No  Obvious fever   More drowsy and seepy    Review of Systems No headache, no major weight loss or weight gain, no chest pain no back pain abdominal pain no change in bowel habits complete ROS otherwise negative     Objective:   Physical Exam Alert and oriented, vitals reviewed and stable, NAD ENT-TM's and ext canals WNL bilat via otoscopic exam Soft palate, tonsils and post pharynx WNL via oropharyngeal exam Neck-symmetric, no masses; thyroid nonpalpable and nontender Pulmonary-no tachypnea or accessory muscle use; Clear without wheezes via auscultation Card--no abnrml murmurs, rhythm reg and rate WNL Carotid pulses symmetric, without bruits Low back some tenderness to percussion no CVA tenderness no negative straight leg raise abdomen benign  Urinalysis no white blood cells no red blood cells positive  urobilinogen       Assessment & Plan:  Impression 1 acute low back pain certainly etiologies such as compression fracture need to be considered apparently pain is pretty impressive at times #2 loose stools go ahead and stop mural lax equivalent for now rationale discussed #3 urobilinogen and urine may or may not be initiated discussed will check some liver function tests plan appropriate blood work x-ray half a hydrocodone every 4-6 when necessary May add to Tylenol symptom care discussed

## 2016-10-27 LAB — CBC WITH DIFFERENTIAL/PLATELET
BASOS ABS: 0 10*3/uL (ref 0.0–0.2)
Basos: 0 %
EOS (ABSOLUTE): 0.1 10*3/uL (ref 0.0–0.4)
EOS: 2 %
HEMATOCRIT: 43.5 % (ref 37.5–51.0)
Hemoglobin: 14.6 g/dL (ref 13.0–17.7)
Immature Grans (Abs): 0 10*3/uL (ref 0.0–0.1)
Immature Granulocytes: 0 %
LYMPHS ABS: 1.4 10*3/uL (ref 0.7–3.1)
Lymphs: 21 %
MCH: 31.1 pg (ref 26.6–33.0)
MCHC: 33.6 g/dL (ref 31.5–35.7)
MCV: 93 fL (ref 79–97)
MONOS ABS: 0.7 10*3/uL (ref 0.1–0.9)
Monocytes: 11 %
Neutrophils Absolute: 4.1 10*3/uL (ref 1.4–7.0)
Neutrophils: 66 %
Platelets: 165 10*3/uL (ref 150–379)
RBC: 4.69 x10E6/uL (ref 4.14–5.80)
RDW: 15.7 % — AB (ref 12.3–15.4)
WBC: 6.3 10*3/uL (ref 3.4–10.8)

## 2016-10-27 LAB — HEPATIC FUNCTION PANEL
ALBUMIN: 4.2 g/dL (ref 3.2–4.6)
ALT: 12 IU/L (ref 0–44)
AST: 21 IU/L (ref 0–40)
Alkaline Phosphatase: 67 IU/L (ref 39–117)
Bilirubin Total: 0.8 mg/dL (ref 0.0–1.2)
Bilirubin, Direct: 0.28 mg/dL (ref 0.00–0.40)
Total Protein: 6.7 g/dL (ref 6.0–8.5)

## 2016-10-27 LAB — BASIC METABOLIC PANEL
BUN/Creatinine Ratio: 23 (ref 10–24)
BUN: 28 mg/dL (ref 10–36)
CALCIUM: 9.6 mg/dL (ref 8.6–10.2)
CHLORIDE: 103 mmol/L (ref 96–106)
CO2: 25 mmol/L (ref 18–29)
Creatinine, Ser: 1.22 mg/dL (ref 0.76–1.27)
GFR calc Af Amer: 57 mL/min/{1.73_m2} — ABNORMAL LOW (ref >59)
GFR, EST NON AFRICAN AMERICAN: 49 mL/min/{1.73_m2} — AB (ref >59)
Glucose: 83 mg/dL (ref 65–99)
POTASSIUM: 4.9 mmol/L (ref 3.5–5.2)
Sodium: 141 mmol/L (ref 134–144)

## 2016-12-03 ENCOUNTER — Encounter: Payer: Self-pay | Admitting: Family Medicine

## 2016-12-03 ENCOUNTER — Ambulatory Visit (INDEPENDENT_AMBULATORY_CARE_PROVIDER_SITE_OTHER): Payer: Medicare Other | Admitting: Family Medicine

## 2016-12-03 VITALS — BP 122/78 | Ht 70.0 in | Wt 160.0 lb

## 2016-12-03 DIAGNOSIS — I639 Cerebral infarction, unspecified: Secondary | ICD-10-CM | POA: Diagnosis not present

## 2016-12-03 DIAGNOSIS — I70209 Unspecified atherosclerosis of native arteries of extremities, unspecified extremity: Secondary | ICD-10-CM | POA: Diagnosis not present

## 2016-12-03 DIAGNOSIS — I7090 Unspecified atherosclerosis: Secondary | ICD-10-CM

## 2016-12-03 DIAGNOSIS — I482 Chronic atrial fibrillation, unspecified: Secondary | ICD-10-CM

## 2016-12-03 DIAGNOSIS — F0150 Vascular dementia without behavioral disturbance: Secondary | ICD-10-CM

## 2016-12-03 NOTE — Progress Notes (Signed)
   Subjective:    Patient ID: Ethan Faulkner, male    DOB: 1916/02/05, 83 y.o.   MRN: [id number]  Hyperlipidemia  This is a chronic problem. The current episode started more than 1 year ago. Pertinent negatives include no chest pain or shortness of breath. Treatments tried: diet. There are no compliance problems.  Risk factors for coronary artery disease include hypertension.   Patient does have moderate multi-infarct dementia more than likely due to atherosclerosis and age and strokes Has chronic microvascular changes noted on MRI as well as strokes and microhemorrhages  Review of Systems  Constitutional: Negative for activity change, fatigue and fever.  Respiratory: Negative for cough and shortness of breath.   Cardiovascular: Negative for chest pain and leg swelling.  Neurological: Negative for headaches.       Objective:   Physical Exam  Constitutional: He appears well-nourished. No distress.  Cardiovascular: Normal rate and normal heart sounds.   No murmur heard. Pulmonary/Chest: Effort normal and breath sounds normal. No respiratory distress.  Musculoskeletal: He exhibits no edema.  Lymphadenopathy:    He has no cervical adenopathy.  Neurological: He is alert.  Psychiatric: His behavior is normal.  Vitals reviewed.   Patient holding his own weight wise      Assessment & Plan:  Mild constipation fiber recommended daily set time continue glycol powder  Atrial fibrillation no bleeding issues on Ella Quiros  Weakness related to age  Blood pressure good control  Drippy nose may use Atrovent nasal spray but currently does not one prescription  Follow-up mid-November to early December  Fatigue tiredness related to age lab work including thyroid recommended in November

## 2017-01-04 ENCOUNTER — Other Ambulatory Visit: Payer: Self-pay | Admitting: Family Medicine

## 2017-01-22 ENCOUNTER — Other Ambulatory Visit: Payer: Self-pay | Admitting: Family Medicine

## 2017-02-07 ENCOUNTER — Encounter (HOSPITAL_COMMUNITY): Payer: Self-pay

## 2017-02-07 ENCOUNTER — Emergency Department (HOSPITAL_COMMUNITY)
Admission: EM | Admit: 2017-02-07 | Discharge: 2017-02-07 | Disposition: A | Discharge status: Home / Self Care | Payer: Medicare Other | Attending: Emergency Medicine | Admitting: Emergency Medicine

## 2017-02-07 ENCOUNTER — Emergency Department (HOSPITAL_COMMUNITY): Payer: Medicare Other

## 2017-02-07 DIAGNOSIS — S3993XA Unspecified injury of pelvis, initial encounter: Secondary | ICD-10-CM | POA: Diagnosis not present

## 2017-02-07 DIAGNOSIS — Z87891 Personal history of nicotine dependence: Secondary | ICD-10-CM | POA: Diagnosis not present

## 2017-02-07 DIAGNOSIS — Z8546 Personal history of malignant neoplasm of prostate: Secondary | ICD-10-CM | POA: Insufficient documentation

## 2017-02-07 DIAGNOSIS — E039 Hypothyroidism, unspecified: Secondary | ICD-10-CM | POA: Insufficient documentation

## 2017-02-07 DIAGNOSIS — Z7901 Long term (current) use of anticoagulants: Secondary | ICD-10-CM | POA: Diagnosis not present

## 2017-02-07 DIAGNOSIS — I10 Essential (primary) hypertension: Secondary | ICD-10-CM | POA: Diagnosis not present

## 2017-02-07 DIAGNOSIS — R300 Dysuria: Secondary | ICD-10-CM | POA: Diagnosis not present

## 2017-02-07 DIAGNOSIS — R319 Hematuria, unspecified: Secondary | ICD-10-CM | POA: Diagnosis not present

## 2017-02-07 DIAGNOSIS — S3991XA Unspecified injury of abdomen, initial encounter: Secondary | ICD-10-CM | POA: Diagnosis not present

## 2017-02-07 DIAGNOSIS — Z79899 Other long term (current) drug therapy: Secondary | ICD-10-CM | POA: Diagnosis not present

## 2017-02-07 HISTORY — DX: Other intervertebral disc degeneration, lumbar region: M51.36

## 2017-02-07 HISTORY — DX: Other intervertebral disc degeneration, lumbar region without mention of lumbar back pain or lower extremity pain: M51.369

## 2017-02-07 LAB — BASIC METABOLIC PANEL
ANION GAP: 8 (ref 5–15)
BUN: 19 mg/dL (ref 6–20)
CO2: 26 mmol/L (ref 22–32)
Calcium: 9.3 mg/dL (ref 8.9–10.3)
Chloride: 104 mmol/L (ref 101–111)
Creatinine, Ser: 1.04 mg/dL (ref 0.61–1.24)
GFR calc Af Amer: >60 mL/min (ref >60)
GFR calc non Af Amer: 58 mL/min — ABNORMAL LOW (ref >60)
GLUCOSE: 108 mg/dL — AB (ref 65–99)
POTASSIUM: 4.2 mmol/L (ref 3.5–5.1)
Sodium: 138 mmol/L (ref 135–145)

## 2017-02-07 LAB — CBC WITH DIFFERENTIAL/PLATELET
BASOS PCT: 0 %
Basophils Absolute: 0 10*3/uL (ref 0.0–0.1)
EOS ABS: 0.3 10*3/uL (ref 0.0–0.7)
Eosinophils Relative: 6 %
HCT: 40.8 % (ref 39.0–52.0)
HEMOGLOBIN: 13.8 g/dL (ref 13.0–17.0)
LYMPHS ABS: 1.2 10*3/uL (ref 0.7–4.0)
Lymphocytes Relative: 29 %
MCH: 31.7 pg (ref 26.0–34.0)
MCHC: 33.8 g/dL (ref 30.0–36.0)
MCV: 93.6 fL (ref 78.0–100.0)
MONO ABS: 0.5 10*3/uL (ref 0.1–1.0)
MONOS PCT: 13 %
NEUTROS PCT: 52 %
Neutro Abs: 2.1 10*3/uL (ref 1.7–7.7)
Platelets: 140 10*3/uL — ABNORMAL LOW (ref 150–400)
RBC: 4.36 MIL/uL (ref 4.22–5.81)
RDW: 13.4 % (ref 11.5–15.5)
WBC: 4.1 10*3/uL (ref 4.0–10.5)

## 2017-02-07 LAB — PROTIME-INR
INR: 1.25
Prothrombin Time: 15.6 seconds — ABNORMAL HIGH (ref 11.4–15.2)

## 2017-02-07 LAB — URINALYSIS, ROUTINE W REFLEX MICROSCOPIC
Bilirubin Urine: NEGATIVE
Glucose, UA: 50 mg/dL — AB
Ketones, ur: NEGATIVE mg/dL
Leukocytes, UA: NEGATIVE
NITRITE: NEGATIVE
PH: 6 (ref 5.0–8.0)
Protein, ur: 100 mg/dL — AB
SPECIFIC GRAVITY, URINE: 1.006 (ref 1.005–1.030)
Squamous Epithelial / HPF: NONE SEEN

## 2017-02-07 MED ORDER — CEFTRIAXONE SODIUM 1 G IJ SOLR
1.0000 g | Freq: Once | INTRAMUSCULAR | Status: AC
Start: 2017-02-07 — End: 2017-02-07
  Administered 2017-02-07: 1 g via INTRAMUSCULAR
  Filled 2017-02-07: qty 10

## 2017-02-07 MED ORDER — LIDOCAINE HCL (PF) 1 % IJ SOLN
INTRAMUSCULAR | Status: AC
  Administered 2017-02-07: 2 mL
  Filled 2017-02-07: qty 2

## 2017-02-07 MED ORDER — CEPHALEXIN 500 MG PO CAPS: 500.0000 mg | ORAL_CAPSULE | Freq: Four times a day (QID) | ORAL | 0 refills | Status: DC

## 2017-02-07 NOTE — Discharge Instructions (Signed)
Take the prescription as directed.  Call your regular medical doctor tomorrow to schedule a follow up appointment within the next 2 days. Call the Urologist tomorrow to schedule a follow up appointment this week.  Return to the Emergency Department immediately sooner if worsening.

## 2017-02-07 NOTE — ED Provider Notes (Signed)
[facility] [facility] DEPT Provider Note   CSN: [id number] Arrival date & time: 02/07/17  1828     History   Chief Complaint Chief Complaint  Patient presents with  . Fall  . Hematuria    HPI Ethan Faulkner is a 83 y.o. male.  The history is provided by a caregiver and the patient. The history is limited by the condition of the patient (Hx dementia).    Pt was seen at 1850. Per pt and his family, c/o gradual onset and persistence of constant hematuria that began this afternoon. Pt states he has had "some" dysuria. Pt's family states pt was walking without his walker last night and lost his balance, falling onto his buttocks. Denies hitting head, no neck or back pain, no LOC, no AMS, no CP/SOB, no abd pain, no N/V/D, no testicular pain/swelling.    Past Medical History:  Diagnosis Date  . Afib ([place])   . Atrial fibrillation ([place])   . Cerebral infarction ([place])   . DDD (degenerative disc disease), lumbar   . Dysphagia   . Hypertension   . Hypothyroidism   . Migraine    "maybe monthly" (11/21/2015)  . Prostate cancer ([place]) ~ 1995   S/P radiation tx  . Renal mass    "slow growing something; doctors just watching it right now" (11/21/2015)  . Stroke [facility])    TIA  . Thrombocytopenia ([place])   . Thyroid disease    hypothyroid    Patient Active Problem List   Diagnosis Date Noted  . 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 Acute CVA (cerebrovascular accident) ([place]) 05/22/2016  . TIA (transient ischemic attack) 05/21/2016  . Left leg weakness   . Leukopenia   . Thrombocytopenia ([place])   . Fall   . Acute blood loss anemia   . Femoral neck fracture ([place]) 11/20/2015  . Renal malignant tumor ([place]) 06/06/2015  . Long-term (current) use of anticoagulants 04/29/2015  . Subclinical hypothyroidism 03/01/2014  . Atrial fibrillation ([place]) 06/09/2013  . STRESS FRACTURE, FOOT 12/12/2008  . CLOSED FRACTURE OF METATARSAL BONE 12/12/2008    Past Surgical History:    Procedure Laterality Date  . COLONOSCOPY  1999  . ESOPHAGOGASTRODUODENOSCOPY    . LAPAROSCOPIC CHOLECYSTECTOMY  1980s  . PROSTATE BIOPSY  ~ 1995  . TOTAL HIP ARTHROPLASTY Left 11/22/2015   Procedure: LEFT HIP HEMIARTHROPLASTY ;  Surgeon: Leandrew Koyanagi, MD;  Location: [facility];  Service: Orthopedics;  Laterality: Left;       Home Medications    Prior to Admission medications   Medication Sig Start Date End Date Taking? Authorizing Provider  acetaminophen (TYLENOL) 325 MG tablet Take 325-650 mg by mouth every 6 (six) hours as needed for mild pain or moderate pain.    [provider]  amLODipine (NORVASC) 2.5 MG tablet Take 1 tablet (2.5 mg total) by mouth daily. 07/08/16   Kathyrn Drown, MD  apixaban (ELIQUIS) 5 MG TABS tablet TAKE (1) TABLET BY MOUTH TWICE DAILY. 07/08/16   Kathyrn Drown, MD  cetirizine (ZYRTEC) 10 MG tablet Take 10 mg by mouth daily.    [provider]  levothyroxine (SYNTHROID, LEVOTHROID) 25 MCG tablet TAKE ONE TABLET EVERY MORNING. 01/22/17   Kathyrn Drown, MD  metoprolol succinate (TOPROL-XL) 25 MG 24 hr tablet Take 1 tablet (25 mg total) by mouth daily. 07/08/16   Kathyrn Drown, MD  polyethylene glycol (MIRALAX / GLYCOLAX) packet Take 17 g by mouth daily.  [provider]  polyethylene glycol powder (GLYCOLAX/MIRALAX) powder MIX 1 CAPFUL IN 8 OZ OF WATER AND DRINK DAILY. 01/05/17   Kathyrn Drown, MD    Family History Family History  Problem Relation Age of Onset  . Stroke Sister        in her 64s  . CAD Neg Hx   . COPD Neg Hx   . Diabetes Neg Hx   . Heart disease Neg Hx     Social History Social History  Substance Use Topics  . Smoking status: Former Research scientist (life sciences)  . Smokeless tobacco: Never Used     Comment: 11/21/2015 "might have smoked 2 packs of cigarettes in my lifetime"  . Alcohol use Yes     Comment: 11/21/2015 "last beer I've had was in ~ 2007"     Allergies   Xanax [alprazolam] and Tape   Review of  Systems Review of Systems  Unable to perform ROS: Dementia        Physical Exam Updated Vital Signs BP (!) 172/87 (BP Location: Right Arm)   Pulse 76   Temp 98.1 F (36.7 C) (Oral)   Resp 15   Ht 5\' 10"  (1.778 m)   Wt 72.6 kg (160 lb)   SpO2 97%   BMI 22.96 kg/m   Patient Vitals for the past 24 hrs:  BP Temp Temp src Pulse Resp SpO2 Height Weight  02/07/17 2145 - - - 62 - 95 % - -  02/07/17 2130 (!) 149/75 - - 69 - 94 % - -  02/07/17 2119 (!) 160/73 - - 69 15 96 % - -  02/07/17 2115 - - - 78 - 96 % - -  02/07/17 1945 - - - 67 - 94 % - -  02/07/17 1930 138/73 - - 72 - 95 % - -  02/07/17 1900 (!) 168/76 - - 76 - 97 % - -  02/07/17 1852 (!) 172/87 98.1 F (36.7 C) Oral 76 - 97 % - -  02/07/17 1848 - - - - - 97 % - -  02/07/17 1841 - - - - - - 5\' 10"  (1.778 m) 72.6 kg (160 lb)  02/07/17 1839 (!) 167/81 98 F (36.7 C) Oral 79 15 96 % - -     Physical Exam 1855: Physical examination:  Nursing notes reviewed; Vital signs and O2 SAT reviewed;  Constitutional: Well developed, Well nourished, Well hydrated, In no acute distress; Head:  Normocephalic, atraumatic; Eyes: EOMI, PERRL, No scleral icterus; ENMT: Mouth and pharynx normal, Mucous membranes moist; Neck: Supple, Full range of motion, No lymphadenopathy; Cardiovascular: Irregular rate and rhythm, No gallop; Respiratory: Breath sounds clear & equal bilaterally, No wheezes.  Speaking full sentences with ease, Normal respiratory effort/excursion; Chest: Nontender, Movement normal; Abdomen: Soft, Nontender, Nondistended, Normal bowel sounds; Genitourinary: No CVA tenderness; Spine:  No midline CS, TS, LS tenderness.;;  Extremities: Pulses normal, Pelvis stable. No tenderness, No edema, No calf edema or asymmetry.; Neuro: Awake, alert, confused per hx dementia, +HOH, otherwise major CN grossly intact.  Speech clear. No gross focal motor or sensory deficits in extremities.; Skin: Color normal, Warm, Dry.   ED Treatments / Results   Labs (all labs ordered are listed, but only abnormal results are displayed)   EKG  EKG Interpretation None       Radiology   Procedures Procedures (including critical care time)  Medications Ordered in ED Medications - No data to display   Initial Impression / Assessment and Plan /  ED Course  I have reviewed the triage vital signs and the nursing notes.  Pertinent labs & imaging results that were available during my care of the patient were reviewed by me and considered in my medical decision making (see chart for details).  MDM Reviewed: previous chart, nursing note and vitals Reviewed previous: labs Interpretation: labs and CT scan   Results for orders placed or performed during the hospital encounter of 03/18/84  Basic metabolic panel  Result Value Ref Range   Sodium 138 135 - 145 mmol/L   Potassium 4.2 3.5 - 5.1 mmol/L   Chloride 104 101 - 111 mmol/L   CO2 26 22 - 32 mmol/L   Glucose, Bld 108 (H) 65 - 99 mg/dL   BUN 19 6 - 20 mg/dL   Creatinine, Ser 1.04 0.61 - 1.24 mg/dL   Calcium 9.3 8.9 - 10.3 mg/dL   GFR calc non Af Amer 58 (L) >60 mL/min   GFR calc Af Amer >60 >60 mL/min   Anion gap 8 5 - 15  Protime-INR  Result Value Ref Range   Prothrombin Time 15.6 (H) 11.4 - 15.2 seconds   INR 1.25   CBC with Differential  Result Value Ref Range   WBC 4.1 4.0 - 10.5 K/uL   RBC 4.36 4.22 - 5.81 MIL/uL   Hemoglobin 13.8 13.0 - 17.0 g/dL   HCT 40.8 39.0 - 52.0 %   MCV 93.6 78.0 - 100.0 fL   MCH 31.7 26.0 - 34.0 pg   MCHC 33.8 30.0 - 36.0 g/dL   RDW 13.4 11.5 - 15.5 %   Platelets 140 (L) 150 - 400 K/uL   Neutrophils Relative % 52 %   Neutro Abs 2.1 1.7 - 7.7 K/uL   Lymphocytes Relative 29 %   Lymphs Abs 1.2 0.7 - 4.0 K/uL   Monocytes Relative 13 %   Monocytes Absolute 0.5 0.1 - 1.0 K/uL   Eosinophils Relative 6 %   Eosinophils Absolute 0.3 0.0 - 0.7 K/uL   Basophils Relative 0 %   Basophils Absolute 0.0 0.0 - 0.1 K/uL  Urinalysis, Routine w reflex  microscopic  Result Value Ref Range   Color, Urine BIOCHEMICALS MAY BE AFFECTED BY COLOR (A) YELLOW   APPearance CLEAR CLEAR   Specific Gravity, Urine 1.006 1.005 - 1.030   pH 6.0 5.0 - 8.0   Glucose, UA 50 (A) NEGATIVE mg/dL   Hgb urine dipstick MODERATE (A) NEGATIVE   Bilirubin Urine NEGATIVE NEGATIVE   Ketones, ur NEGATIVE NEGATIVE mg/dL   Protein, ur 100 (A) NEGATIVE mg/dL   Nitrite NEGATIVE NEGATIVE   Leukocytes, UA NEGATIVE NEGATIVE   RBC / HPF TOO NUMEROUS TO COUNT 0 - 5 RBC/hpf   WBC, UA TOO NUMEROUS TO COUNT 0 - 5 WBC/hpf   Bacteria, UA RARE (A) NONE SEEN   Squamous Epithelial / LPF NONE SEEN NONE SEEN   Ct Renal Stone Study Result Date: 02/07/2017 CLINICAL DATA:  Patient fell last evening going to the bathroom falling on buttocks. Known left renal mass. Patient reports hematuria with clots. EXAM: CT ABDOMEN AND PELVIS WITHOUT CONTRAST TECHNIQUE: Multidetector CT imaging of the abdomen and pelvis was performed following the standard protocol without IV contrast. COMPARISON:  CT 06/05/2015 FINDINGS: Lower chest: Stable cardiomegaly with left main and 3 vessel coronary arteriosclerosis. Mild to moderate aortic aortic atherosclerosis. Dependent atelectasis in both lower lobes and lingula with mild right lower lobe bronchiectasis. No effusion. Hepatobiliary: Stable low-density lesions statistically consistent with cysts or  hemangiomata are again noted of the liver some are too small to further characterize, the largest are in the caudate measuring 13 mm and in the left lower lobe measuring 19 mm. Status post cholecystectomy. No biliary dilatation. Pancreas: Unremarkable. No pancreatic ductal dilatation or surrounding inflammatory changes. Spleen: Small splenic granulomas.  No splenomegaly. Adrenals/Urinary Tract: Normal bilateral adrenal glands. The right kidney is stable in appearance with cortical scarring and 2 peripherally calcified interpolar lesions again noted measuring up to 1.3 cm  anteriorly and 1 cm posteriorly. There has been some interval increase in size of lobular interpolar anterior left renal mass measuring 5.6 x 4.8 cm versus 5.1 x 4.7 cm previously in the same axial plane. Hyperdense posterior interpolar mass possibly a hemorrhagic or proteinaceous cysts is stable to slightly smaller in appearance measuring 2.3 x 1.6 cm versus 2.3 cm a 2.3 cm previously. Peripherally calcified left upper pole mass is stable at 2.3 cm. A simple exophytic cyst off the lower pole the left kidney is again noted measuring up to 3.5 cm in maximum dimension with water attenuation within. No nephrolithiasis or hydroureteronephrosis. The urinary bladder is physiologically distended without bold mural thickening, calculus or focal mass. Stomach/Bowel: Stomach is within normal limits. Appendix appears normal. No evidence of bowel wall thickening, distention, or inflammatory changes. Vascular/Lymphatic: No enlarged abdominal or pelvic lymph nodes. Extensive atherosclerosis of the aorta and its branch vessels. No aneurysm. Reproductive: Normal size prostate with central zone calcifications. Unremarkable seminal vesicles. Other: No abdominopelvic ascites.  No free air. Musculoskeletal: Left hip arthroplasty. Schmorl's node off the inferior endplate of L2 is unchanged. Degenerative disc disease L5-S1 with moderate disc space narrowing. No acute nor suspicious osseous abnormalities. IMPRESSION: 1. Further mild enlargement of previously described anterior left renal mass currently estimated at 5.6 x 4.8 cm versus 5.1 x 4.7 cm previously. Stable partially calcified bilateral renal lesions measuring up to 1.3 cm in the upper pole the right kidney and 2.3 cm in the upper pole the left kidney. Stable complex and simple cysts of the left kidney. 2. No acute solid nor hollow visceral organ injury. No laceration or hematoma. 3. Chronic degenerative change of the lumbar spine. No acute nor suspicious osseous lesions. 4.  Stable cysts or hemangiomata of the liver. 5. Stable cardiomegaly with coronary arteriosclerosis. Electronically Signed   By: Ashley Royalty M.D.   On: 02/07/2017 20:27    2200:  CT with known renal masses. Will tx for UTI while UC pending. VS, H/H and BUN/Cr stable. Hematuria starting to clear while in the ED. No clear indication for admission at this time. Family would like to take pt home now. Dx and testing d/w pt and family.  Questions answered.  Verb understanding, agreeable to d/c home with outpt f/u.   Final Clinical Impressions(s) / ED Diagnoses   Final diagnoses:  None    New Prescriptions New Prescriptions   No medications on file     Francine Graven, DO 02/10/17 1254

## 2017-02-07 NOTE — ED Triage Notes (Signed)
Patient reports of falling last night while going to bathroom, states he did not have his walker and lost balance. Reports of falling on buttocks. Denies hitting head, neck pain or back pain. Patient denies loc. Currently taking Eliquis. Also reports of hematuria that started today with clots.

## 2017-02-08 ENCOUNTER — Telehealth: Payer: Self-pay | Admitting: Family Medicine

## 2017-02-08 ENCOUNTER — Ambulatory Visit (INDEPENDENT_AMBULATORY_CARE_PROVIDER_SITE_OTHER): Payer: Medicare Other | Admitting: Family Medicine

## 2017-02-08 ENCOUNTER — Encounter: Payer: Self-pay | Admitting: Family Medicine

## 2017-02-08 VITALS — BP 120/70 | Temp 98.0°F | Ht 70.0 in

## 2017-02-08 DIAGNOSIS — D4102 Neoplasm of uncertain behavior of left kidney: Secondary | ICD-10-CM | POA: Diagnosis not present

## 2017-02-08 DIAGNOSIS — I639 Cerebral infarction, unspecified: Secondary | ICD-10-CM | POA: Diagnosis not present

## 2017-02-08 DIAGNOSIS — R310 Gross hematuria: Secondary | ICD-10-CM | POA: Diagnosis not present

## 2017-02-08 DIAGNOSIS — R319 Hematuria, unspecified: Secondary | ICD-10-CM | POA: Diagnosis not present

## 2017-02-08 DIAGNOSIS — C642 Malignant neoplasm of left kidney, except renal pelvis: Secondary | ICD-10-CM

## 2017-02-08 DIAGNOSIS — I482 Chronic atrial fibrillation, unspecified: Secondary | ICD-10-CM

## 2017-02-08 DIAGNOSIS — Z8546 Personal history of malignant neoplasm of prostate: Secondary | ICD-10-CM | POA: Diagnosis not present

## 2017-02-08 DIAGNOSIS — R338 Other retention of urine: Secondary | ICD-10-CM | POA: Diagnosis not present

## 2017-02-08 LAB — POCT HEMOGLOBIN: Hemoglobin: 15.5 g/dL (ref 14.1–18.1)

## 2017-02-08 NOTE — Telephone Encounter (Signed)
Patient is bleeding when he goes to the bathroom.  They did CT and urinalysis and was told he had a UTI last night at the ER.  [name] is requesting Dr. Nicki Reaper to call her back.

## 2017-02-08 NOTE — Telephone Encounter (Signed)
The patient is being worked an 11:30

## 2017-02-08 NOTE — Telephone Encounter (Signed)
Also, wanted to let you know that [name] stopped giving him the Plavix.

## 2017-02-08 NOTE — Progress Notes (Signed)
   Subjective:    Patient ID: Ethan Faulkner, male    DOB: May 08, 1916, 83 y.o.   MRN: [id number]  Hematuria  The current episode started yesterday. Pertinent negatives include no abdominal pain.  was seen in the ed yesterday after he noticed bleeding while urinating. Patient having spells of groin pain. Patient with gross hematuria. States some difficulty urinating earlier today Started yesterday No flank pain or abdominal pain Denies fever chills sweats nausea vomiting diarrhea Has a history of atrial fibrillation and also multi stroke dementia patient is on blood thinner Eliquis Patient has not had any bleeding issues with this medication Denies any miss use a medicine Nothing is seen to trigger this nothing is seemed to help Went to the ER last night CAT scan lab work reviewed with family in person    Review of Systems  Constitutional: Negative for activity change, appetite change and fatigue.  HENT: Negative for congestion and rhinorrhea.   Respiratory: Negative for cough, shortness of breath, wheezing and stridor.   Cardiovascular: Negative for chest pain and leg swelling.  Gastrointestinal: Negative for abdominal pain, anal bleeding, blood in stool, constipation and diarrhea.  Endocrine: Negative for polydipsia and polyphagia.  Genitourinary: Positive for hematuria.  Neurological: Negative for weakness.  Psychiatric/Behavioral: Negative for confusion.   Results for orders placed or performed in visit on 02/08/17  POCT hemoglobin  Result Value Ref Range   Hemoglobin 15.5 14.1 - 18.1 g/dL       Objective:   Physical Exam  Constitutional: He appears well-nourished. No distress.  Cardiovascular: Normal rate and normal heart sounds.  Exam reveals no gallop and no friction rub.   No murmur heard. Pulmonary/Chest: Effort normal and breath sounds normal. No respiratory distress. He has no wheezes. He has no rales.  Abdominal: Soft. He exhibits no distension. There is no  tenderness. There is no rebound and no guarding.  Musculoskeletal: He exhibits no edema.  Lymphadenopathy:    He has no cervical adenopathy.  Neurological: He is alert.  Psychiatric: His behavior is normal.  Vitals reviewed.   Gross hematuria on urinalysis 25 minutes was spent with the patient. Greater than half the time was spent in discussion and answering questions and counseling regarding the issues that the patient came in for today.      Assessment & Plan:  Gross hematuria Culture pending Hemoglobin stable 15 Patient not in [place] discussion held regarding gross hematuria regarding the importance of seeing urology later today plus also following up next week Hold off on Eliquis no Eliquis for the following week May need to do home health with checking CBC and a couple days we will discuss with family after they see urology later today

## 2017-02-09 LAB — URINE CULTURE: CULTURE: NO GROWTH

## 2017-02-14 ENCOUNTER — Encounter (HOSPITAL_COMMUNITY): Payer: Self-pay | Admitting: Emergency Medicine

## 2017-02-14 ENCOUNTER — Inpatient Hospital Stay (HOSPITAL_COMMUNITY)
Admission: EM | Admit: 2017-02-14 | Discharge: 2017-02-19 | DRG: 061 | Disposition: A | Discharge status: Skilled Nursing Facility | Payer: Medicare Other | Attending: Neurology | Admitting: Neurology

## 2017-02-14 ENCOUNTER — Emergency Department (HOSPITAL_COMMUNITY): Payer: Medicare Other

## 2017-02-14 DIAGNOSIS — Z9049 Acquired absence of other specified parts of digestive tract: Secondary | ICD-10-CM

## 2017-02-14 DIAGNOSIS — R319 Hematuria, unspecified: Secondary | ICD-10-CM | POA: Diagnosis not present

## 2017-02-14 DIAGNOSIS — G9349 Other encephalopathy: Secondary | ICD-10-CM | POA: Diagnosis present

## 2017-02-14 DIAGNOSIS — Z91048 Other nonmedicinal substance allergy status: Secondary | ICD-10-CM | POA: Diagnosis not present

## 2017-02-14 DIAGNOSIS — E038 Other specified hypothyroidism: Secondary | ICD-10-CM | POA: Diagnosis not present

## 2017-02-14 DIAGNOSIS — I482 Chronic atrial fibrillation: Secondary | ICD-10-CM | POA: Diagnosis present

## 2017-02-14 DIAGNOSIS — Z7901 Long term (current) use of anticoagulants: Secondary | ICD-10-CM

## 2017-02-14 DIAGNOSIS — I63233 Cerebral infarction due to unspecified occlusion or stenosis of bilateral carotid arteries: Secondary | ICD-10-CM | POA: Diagnosis not present

## 2017-02-14 DIAGNOSIS — I509 Heart failure, unspecified: Secondary | ICD-10-CM

## 2017-02-14 DIAGNOSIS — G464 Cerebellar stroke syndrome: Secondary | ICD-10-CM | POA: Diagnosis not present

## 2017-02-14 DIAGNOSIS — Z888 Allergy status to other drugs, medicaments and biological substances status: Secondary | ICD-10-CM | POA: Diagnosis not present

## 2017-02-14 DIAGNOSIS — I63412 Cerebral infarction due to embolism of left middle cerebral artery: Principal | ICD-10-CM | POA: Diagnosis present

## 2017-02-14 DIAGNOSIS — I639 Cerebral infarction, unspecified: Secondary | ICD-10-CM

## 2017-02-14 DIAGNOSIS — Z923 Personal history of irradiation: Secondary | ICD-10-CM

## 2017-02-14 DIAGNOSIS — Z9181 History of falling: Secondary | ICD-10-CM | POA: Diagnosis not present

## 2017-02-14 DIAGNOSIS — Z9221 Personal history of antineoplastic chemotherapy: Secondary | ICD-10-CM

## 2017-02-14 DIAGNOSIS — I69391 Dysphagia following cerebral infarction: Secondary | ICD-10-CM | POA: Diagnosis not present

## 2017-02-14 DIAGNOSIS — R414 Neurologic neglect syndrome: Secondary | ICD-10-CM | POA: Diagnosis present

## 2017-02-14 DIAGNOSIS — Z79899 Other long term (current) drug therapy: Secondary | ICD-10-CM

## 2017-02-14 DIAGNOSIS — Z8546 Personal history of malignant neoplasm of prostate: Secondary | ICD-10-CM | POA: Diagnosis not present

## 2017-02-14 DIAGNOSIS — M6281 Muscle weakness (generalized): Secondary | ICD-10-CM | POA: Diagnosis not present

## 2017-02-14 DIAGNOSIS — R1311 Dysphagia, oral phase: Secondary | ICD-10-CM | POA: Diagnosis present

## 2017-02-14 DIAGNOSIS — I10 Essential (primary) hypertension: Secondary | ICD-10-CM | POA: Diagnosis present

## 2017-02-14 DIAGNOSIS — R279 Unspecified lack of coordination: Secondary | ICD-10-CM | POA: Diagnosis not present

## 2017-02-14 DIAGNOSIS — R748 Abnormal levels of other serum enzymes: Secondary | ICD-10-CM | POA: Diagnosis present

## 2017-02-14 DIAGNOSIS — R310 Gross hematuria: Secondary | ICD-10-CM | POA: Diagnosis not present

## 2017-02-14 DIAGNOSIS — E039 Hypothyroidism, unspecified: Secondary | ICD-10-CM | POA: Diagnosis present

## 2017-02-14 DIAGNOSIS — D696 Thrombocytopenia, unspecified: Secondary | ICD-10-CM | POA: Diagnosis not present

## 2017-02-14 DIAGNOSIS — I4891 Unspecified atrial fibrillation: Secondary | ICD-10-CM

## 2017-02-14 DIAGNOSIS — E785 Hyperlipidemia, unspecified: Secondary | ICD-10-CM | POA: Diagnosis present

## 2017-02-14 DIAGNOSIS — I272 Pulmonary hypertension, unspecified: Secondary | ICD-10-CM | POA: Diagnosis present

## 2017-02-14 DIAGNOSIS — Z96642 Presence of left artificial hip joint: Secondary | ICD-10-CM | POA: Diagnosis present

## 2017-02-14 DIAGNOSIS — Z23 Encounter for immunization: Secondary | ICD-10-CM | POA: Diagnosis not present

## 2017-02-14 DIAGNOSIS — N2889 Other specified disorders of kidney and ureter: Secondary | ICD-10-CM | POA: Diagnosis present

## 2017-02-14 DIAGNOSIS — R531 Weakness: Secondary | ICD-10-CM | POA: Diagnosis not present

## 2017-02-14 DIAGNOSIS — R4182 Altered mental status, unspecified: Secondary | ICD-10-CM | POA: Diagnosis not present

## 2017-02-14 DIAGNOSIS — I160 Hypertensive urgency: Secondary | ICD-10-CM

## 2017-02-14 DIAGNOSIS — C642 Malignant neoplasm of left kidney, except renal pelvis: Secondary | ICD-10-CM | POA: Diagnosis not present

## 2017-02-14 DIAGNOSIS — Z8673 Personal history of transient ischemic attack (TIA), and cerebral infarction without residual deficits: Secondary | ICD-10-CM | POA: Diagnosis not present

## 2017-02-14 DIAGNOSIS — R1312 Dysphagia, oropharyngeal phase: Secondary | ICD-10-CM | POA: Diagnosis not present

## 2017-02-14 DIAGNOSIS — I350 Nonrheumatic aortic (valve) stenosis: Secondary | ICD-10-CM | POA: Diagnosis not present

## 2017-02-14 DIAGNOSIS — R29722 NIHSS score 22: Secondary | ICD-10-CM | POA: Diagnosis present

## 2017-02-14 DIAGNOSIS — Z5189 Encounter for other specified aftercare: Secondary | ICD-10-CM | POA: Diagnosis not present

## 2017-02-14 DIAGNOSIS — G8191 Hemiplegia, unspecified affecting right dominant side: Secondary | ICD-10-CM | POA: Diagnosis present

## 2017-02-14 DIAGNOSIS — Z823 Family history of stroke: Secondary | ICD-10-CM

## 2017-02-14 DIAGNOSIS — R29810 Facial weakness: Secondary | ICD-10-CM | POA: Diagnosis not present

## 2017-02-14 DIAGNOSIS — R29818 Other symptoms and signs involving the nervous system: Secondary | ICD-10-CM | POA: Diagnosis not present

## 2017-02-14 DIAGNOSIS — I69320 Aphasia following cerebral infarction: Secondary | ICD-10-CM | POA: Diagnosis not present

## 2017-02-14 DIAGNOSIS — E876 Hypokalemia: Secondary | ICD-10-CM | POA: Diagnosis present

## 2017-02-14 DIAGNOSIS — I69322 Dysarthria following cerebral infarction: Secondary | ICD-10-CM

## 2017-02-14 DIAGNOSIS — I351 Nonrheumatic aortic (valve) insufficiency: Secondary | ICD-10-CM | POA: Diagnosis present

## 2017-02-14 DIAGNOSIS — I454 Nonspecific intraventricular block: Secondary | ICD-10-CM | POA: Diagnosis present

## 2017-02-14 DIAGNOSIS — R4701 Aphasia: Secondary | ICD-10-CM | POA: Diagnosis present

## 2017-02-14 DIAGNOSIS — R41841 Cognitive communication deficit: Secondary | ICD-10-CM | POA: Diagnosis present

## 2017-02-14 DIAGNOSIS — Z87891 Personal history of nicotine dependence: Secondary | ICD-10-CM

## 2017-02-14 DIAGNOSIS — G43909 Migraine, unspecified, not intractable, without status migrainosus: Secondary | ICD-10-CM | POA: Diagnosis present

## 2017-02-14 DIAGNOSIS — I248 Other forms of acute ischemic heart disease: Secondary | ICD-10-CM | POA: Diagnosis present

## 2017-02-14 DIAGNOSIS — Z79890 Hormone replacement therapy (postmenopausal): Secondary | ICD-10-CM

## 2017-02-14 DIAGNOSIS — I6789 Other cerebrovascular disease: Secondary | ICD-10-CM | POA: Diagnosis not present

## 2017-02-14 DIAGNOSIS — R2689 Other abnormalities of gait and mobility: Secondary | ICD-10-CM | POA: Diagnosis not present

## 2017-02-14 DIAGNOSIS — R4781 Slurred speech: Secondary | ICD-10-CM | POA: Diagnosis not present

## 2017-02-14 LAB — I-STAT TROPONIN, ED: TROPONIN I, POC: 0.14 ng/mL — AB (ref 0.00–0.08)

## 2017-02-14 LAB — COMPREHENSIVE METABOLIC PANEL
ALT: 13 U/L — ABNORMAL LOW (ref 17–63)
ANION GAP: 9 (ref 5–15)
AST: 23 U/L (ref 15–41)
Albumin: 4 g/dL (ref 3.5–5.0)
Alkaline Phosphatase: 59 U/L (ref 38–126)
BILIRUBIN TOTAL: 0.9 mg/dL (ref 0.3–1.2)
BUN: 16 mg/dL (ref 6–20)
CO2: 26 mmol/L (ref 22–32)
Calcium: 9.3 mg/dL (ref 8.9–10.3)
Chloride: 104 mmol/L (ref 101–111)
Creatinine, Ser: 0.96 mg/dL (ref 0.61–1.24)
GFR calc Af Amer: >60 mL/min (ref >60)
Glucose, Bld: 131 mg/dL — ABNORMAL HIGH (ref 65–99)
POTASSIUM: 4.2 mmol/L (ref 3.5–5.1)
Sodium: 139 mmol/L (ref 135–145)
TOTAL PROTEIN: 7.3 g/dL (ref 6.5–8.1)

## 2017-02-14 LAB — DIFFERENTIAL
BASOS ABS: 0 10*3/uL (ref 0.0–0.1)
Basophils Relative: 0 %
Eosinophils Absolute: 0.2 10*3/uL (ref 0.0–0.7)
Eosinophils Relative: 4 %
LYMPHS PCT: 38 %
Lymphs Abs: 2 10*3/uL (ref 0.7–4.0)
MONO ABS: 0.7 10*3/uL (ref 0.1–1.0)
Monocytes Relative: 14 %
NEUTROS ABS: 2.3 10*3/uL (ref 1.7–7.7)
NEUTROS PCT: 44 %

## 2017-02-14 LAB — PROTIME-INR
INR: 1.08
PROTHROMBIN TIME: 13.9 s (ref 11.4–15.2)

## 2017-02-14 LAB — I-STAT CHEM 8, ED
BUN: 17 mg/dL (ref 6–20)
CALCIUM ION: 1.21 mmol/L (ref 1.15–1.40)
Chloride: 104 mmol/L (ref 101–111)
Creatinine, Ser: 1 mg/dL (ref 0.61–1.24)
GLUCOSE: 126 mg/dL — AB (ref 65–99)
HCT: 42 % (ref 39.0–52.0)
HEMOGLOBIN: 14.3 g/dL (ref 13.0–17.0)
POTASSIUM: 4.4 mmol/L (ref 3.5–5.1)
Sodium: 141 mmol/L (ref 135–145)
TCO2: 26 mmol/L (ref 22–32)

## 2017-02-14 LAB — CBC
HCT: 41.2 % (ref 39.0–52.0)
Hemoglobin: 14.2 g/dL (ref 13.0–17.0)
MCH: 32.2 pg (ref 26.0–34.0)
MCHC: 34.5 g/dL (ref 30.0–36.0)
MCV: 93.4 fL (ref 78.0–100.0)
PLATELETS: 179 10*3/uL (ref 150–400)
RBC: 4.41 MIL/uL (ref 4.22–5.81)
RDW: 13 % (ref 11.5–15.5)
WBC: 5.4 10*3/uL (ref 4.0–10.5)

## 2017-02-14 LAB — APTT: APTT: 41 s — AB (ref 24–36)

## 2017-02-14 LAB — CBG MONITORING, ED: GLUCOSE-CAPILLARY: 117 mg/dL — AB (ref 65–99)

## 2017-02-14 MED ORDER — PANTOPRAZOLE SODIUM 40 MG IV SOLR
40.0000 mg | Freq: Every day | INTRAVENOUS | Status: DC
  Administered 2017-02-15 (×2): 40 mg via INTRAVENOUS
  Filled 2017-02-14 (×2): qty 40

## 2017-02-14 MED ORDER — CLEVIDIPINE BUTYRATE 0.5 MG/ML IV EMUL: 0.0000 mg/h | INTRAVENOUS | Status: DC

## 2017-02-14 MED ORDER — LABETALOL HCL 5 MG/ML IV SOLN: 20.0000 mg | Freq: Once | INTRAVENOUS | Status: DC

## 2017-02-14 MED ORDER — ACETAMINOPHEN 650 MG RE SUPP: 650.0000 mg | RECTAL | Status: DC | PRN

## 2017-02-14 MED ORDER — ALTEPLASE 100 MG IV SOLR
INTRAVENOUS | Status: AC
  Filled 2017-02-14: qty 100

## 2017-02-14 MED ORDER — ACETAMINOPHEN 325 MG PO TABS: 650.0000 mg | ORAL_TABLET | ORAL | Status: DC | PRN

## 2017-02-14 MED ORDER — NICARDIPINE HCL IN NACL 20-0.86 MG/200ML-% IV SOLN
0.0000 mg/h | INTRAVENOUS | Status: DC
  Administered 2017-02-14: 5 mg/h via INTRAVENOUS

## 2017-02-14 MED ORDER — LORAZEPAM 2 MG/ML IJ SOLN
1.0000 mg | Freq: Once | INTRAMUSCULAR | Status: AC
  Administered 2017-02-14: 1 mg via INTRAVENOUS
  Filled 2017-02-14: qty 1

## 2017-02-14 MED ORDER — NICARDIPINE HCL IN NACL 20-0.86 MG/200ML-% IV SOLN
INTRAVENOUS | Status: AC
  Filled 2017-02-14: qty 200

## 2017-02-14 MED ORDER — LORAZEPAM 2 MG/ML IJ SOLN
INTRAMUSCULAR | Status: AC
  Filled 2017-02-14: qty 1

## 2017-02-14 MED ORDER — ACETAMINOPHEN 160 MG/5ML PO SOLN: 650.0000 mg | ORAL | Status: DC | PRN

## 2017-02-14 MED ORDER — STROKE: EARLY STAGES OF RECOVERY BOOK
Freq: Once | Status: AC
  Administered 2017-02-14: 22:00:00
  Filled 2017-02-14: qty 1

## 2017-02-14 MED ORDER — IOPAMIDOL (ISOVUE-370) INJECTION 76%
100.0000 mL | Freq: Once | INTRAVENOUS | Status: AC | PRN
  Administered 2017-02-14: 100 mL via INTRAVENOUS

## 2017-02-14 MED ORDER — ALTEPLASE (STROKE) FULL DOSE INFUSION
0.9000 mg/kg | Freq: Once | INTRAVENOUS | Status: AC
  Administered 2017-02-14: 63 mg via INTRAVENOUS
  Filled 2017-02-14: qty 100

## 2017-02-14 MED ORDER — SODIUM CHLORIDE 0.9 % IV SOLN
INTRAVENOUS | Status: DC
  Administered 2017-02-15 – 2017-02-18 (×4): via INTRAVENOUS

## 2017-02-14 MED ORDER — LORAZEPAM 2 MG/ML IJ SOLN
1.0000 mg | Freq: Once | INTRAMUSCULAR | Status: AC
  Administered 2017-02-14: 1 mg via INTRAVENOUS

## 2017-02-14 MED ORDER — SODIUM CHLORIDE 0.9 % IV SOLN
50.0000 mL | Freq: Once | INTRAVENOUS | Status: AC
  Administered 2017-02-14: 50 mL via INTRAVENOUS

## 2017-02-14 NOTE — ED Notes (Signed)
SOC Dr. Audelia Hives sent to Dr. Rogene Houston at [phone].

## 2017-02-14 NOTE — Consult Note (Addendum)
Admission H&P    Chief Complaint: Acute onset of speech difficulty and right-sided weakness.  HPI: Ethan Faulkner is an 83 y.o. male with a istory of atrial fibrillation and recently discontinued anticoagulation, hypertension, hyperlipidemia and old stroke, brought to the ED at [facility] with acute onset of inability to speak and staring to his left side along with weakness involving right arm and right leg. He was noted to have findings indicative of acute left MCA territory ischemic infarction. CT scan of his head showed no acute intracranial abnormality. He was deemed a candidate for IV TPA which was administered.  LSN: [id number] on 02/14/2017 tPA Given: Yes mRankin:  Past Medical History:  Diagnosis Date  . Afib ([place])   . Atrial fibrillation ([place])   . Cerebral infarction ([place])   . DDD (degenerative disc disease), lumbar   . Dysphagia   . Hypertension   . Hypothyroidism   . Migraine    "maybe monthly" (11/21/2015)  . Prostate cancer ([place]) ~ 1995   S/P radiation tx  . Renal mass    "slow growing something; doctors just watching it right now" (11/21/2015)  . Stroke [facility])    TIA  . Thrombocytopenia ([place])   . Thyroid disease    hypothyroid    Past Surgical History:  Procedure Laterality Date  . COLONOSCOPY  1999  . ESOPHAGOGASTRODUODENOSCOPY    . LAPAROSCOPIC CHOLECYSTECTOMY  1980s  . PROSTATE BIOPSY  ~ 1995  . TOTAL HIP ARTHROPLASTY Left 11/22/2015   Procedure: LEFT HIP HEMIARTHROPLASTY ;  Surgeon: Leandrew Koyanagi, MD;  Location: [facility];  Service: Orthopedics;  Laterality: Left;    Family History  Problem Relation Age of Onset  . Stroke Sister        in her 19s  . CAD Neg Hx   . COPD Neg Hx   . Diabetes Neg Hx   . Heart disease Neg Hx    Social History:  reports that he has quit smoking. He has never used smokeless tobacco. He reports that he drinks alcohol. He reports that he does not use drugs.  Allergies:  Allergies  Allergen Reactions  . Xanax [Alprazolam]  Other (See Comments)    Dizzy, Sedated  . Tape Rash    PATIENT'S SKIN WILL TEAR/PLEASE EITHER USE PAPER TAPE OR COBAN WRAP!!    Medications Prior to Admission  Medication Sig Dispense Refill  . acetaminophen (TYLENOL) 325 MG tablet Take 325-650 mg by mouth every 6 (six) hours as needed for mild pain or moderate pain.    Marland Kitchen amLODipine (NORVASC) 2.5 MG tablet Take 1 tablet (2.5 mg total) by mouth daily. 30 tablet 5  . apixaban (ELIQUIS) 5 MG TABS tablet TAKE (1) TABLET BY MOUTH TWICE DAILY. 60 tablet 5  . cephALEXin (KEFLEX) 500 MG capsule Take 1 capsule (500 mg total) by mouth 4 (four) times daily. 40 capsule 0  . cetirizine (ZYRTEC) 10 MG tablet Take 10 mg by mouth every evening.     Marland Kitchen levothyroxine (SYNTHROID, LEVOTHROID) 25 MCG tablet TAKE ONE TABLET EVERY MORNING. 30 tablet 0  . metoprolol succinate (TOPROL-XL) 25 MG 24 hr tablet Take 1 tablet (25 mg total) by mouth daily. (Patient taking differently: Take 25 mg by mouth every evening. ) 30 tablet 5  . polyethylene glycol powder (GLYCOLAX/MIRALAX) powder MIX 1 CAPFUL IN 8 OZ OF WATER AND DRINK DAILY. (Patient taking differently: MIX 1 CAPFUL IN 8 OZ OF WATER AND DRINK EVERY OTHER DAY) 527 g 0  ROS: Unavailable, as patient is confused and agitated currently.  Physical Examination: Blood pressure (!) 184/104, pulse (!) 102, temperature 97.9 F (36.6 C), temperature source Oral, resp. rate (!) 23, height '5\' 6"'  (1.676 m), weight 70.3 kg (155 lb), SpO2 94 %.  HEENT-  Normocephalic, no lesions, without obvious abnormality.  Normal external eye and conjunctiva.  Normal TM's bilaterally.  Normal auditory canals and external ears. Normal external nose, mucus membranes and septum.  Normal pharynx. Neck supple with no masses, nodes, nodules or enlargement. Cardiovascular - irregularly irregular rhythm, S1, S2 normal and no S3 or S4 Lungs - chest clear, no wheezing, rales, normal symmetric air entry Abdomen - soft, non-tender; bowel sounds  normal; no masses,  no organomegaly Extremities - no joint deformities, effusion, or inflammation  Neurologic Examination: Patient was markedly lethargic and agitated. He had no speech output. He did not follow any commands. Eyes were midline with no gaze preference noted. Visual fields could not be adequately assessed. Extraocular movements were intact and conjugate with right left lateral gaze. No facial weakness was noted. Motor exam showed normal strength throughout including right upper and lower extremities with normal muscle tone as well. Deep tendon reflexes were trace to 1+ and symmetrical. Plantar responses were flexor bilaterally. Carotid auscultation was normal.  Results for orders placed or performed during the hospital encounter of 02/14/17 (from the past 48 hour(s))  Protime-INR     Status: None   Collection Time: 02/14/17  6:25 PM  Result Value Ref Range   Prothrombin Time 13.9 11.4 - 15.2 seconds   INR 1.08   APTT     Status: Abnormal   Collection Time: 02/14/17  6:25 PM  Result Value Ref Range   aPTT 41 (H) 24 - 36 seconds    Comment:        IF BASELINE aPTT IS ELEVATED, SUGGEST PATIENT RISK ASSESSMENT BE USED TO DETERMINE APPROPRIATE ANTICOAGULANT THERAPY.   CBC     Status: None   Collection Time: 02/14/17  6:25 PM  Result Value Ref Range   WBC 5.4 4.0 - 10.5 K/uL   RBC 4.41 4.22 - 5.81 MIL/uL   Hemoglobin 14.2 13.0 - 17.0 g/dL   HCT 41.2 39.0 - 52.0 %   MCV 93.4 78.0 - 100.0 fL   MCH 32.2 26.0 - 34.0 pg   MCHC 34.5 30.0 - 36.0 g/dL   RDW 13.0 11.5 - 15.5 %   Platelets 179 150 - 400 K/uL  Differential     Status: None   Collection Time: 02/14/17  6:25 PM  Result Value Ref Range   Neutrophils Relative % 44 %   Neutro Abs 2.3 1.7 - 7.7 K/uL   Lymphocytes Relative 38 %   Lymphs Abs 2.0 0.7 - 4.0 K/uL   Monocytes Relative 14 %   Monocytes Absolute 0.7 0.1 - 1.0 K/uL   Eosinophils Relative 4 %   Eosinophils Absolute 0.2 0.0 - 0.7 K/uL   Basophils  Relative 0 %   Basophils Absolute 0.0 0.0 - 0.1 K/uL  Comprehensive metabolic panel     Status: Abnormal   Collection Time: 02/14/17  6:25 PM  Result Value Ref Range   Sodium 139 135 - 145 mmol/L   Potassium 4.2 3.5 - 5.1 mmol/L   Chloride 104 101 - 111 mmol/L   CO2 26 22 - 32 mmol/L   Glucose, Bld 131 (H) 65 - 99 mg/dL   BUN 16 6 - 20 mg/dL   Creatinine, Ser  0.96 0.61 - 1.24 mg/dL   Calcium 9.3 8.9 - 10.3 mg/dL   Total Protein 7.3 6.5 - 8.1 g/dL   Albumin 4.0 3.5 - 5.0 g/dL   AST 23 15 - 41 U/L   ALT 13 (L) 17 - 63 U/L   Alkaline Phosphatase 59 38 - 126 U/L   Total Bilirubin 0.9 0.3 - 1.2 mg/dL   GFR calc non Af Amer >60 >60 mL/min   GFR calc Af Amer >60 >60 mL/min    Comment: (NOTE) The eGFR has been calculated using the CKD EPI equation. This calculation has not been validated in all clinical situations. eGFR's persistently <60 mL/min signify possible Chronic Kidney Disease.    Anion gap 9 5 - 15  CBG monitoring, ED     Status: Abnormal   Collection Time: 02/14/17  6:33 PM  Result Value Ref Range   Glucose-Capillary 117 (H) 65 - 99 mg/dL  I-Stat Chem 8, ED     Status: Abnormal   Collection Time: 02/14/17  6:40 PM  Result Value Ref Range   Sodium 141 135 - 145 mmol/L   Potassium 4.4 3.5 - 5.1 mmol/L   Chloride 104 101 - 111 mmol/L   BUN 17 6 - 20 mg/dL   Creatinine, Ser 1.00 0.61 - 1.24 mg/dL   Glucose, Bld 126 (H) 65 - 99 mg/dL   Calcium, Ion 1.21 1.15 - 1.40 mmol/L   TCO2 26 22 - 32 mmol/L   Hemoglobin 14.3 13.0 - 17.0 g/dL   HCT 42.0 39.0 - 52.0 %  I-stat troponin, ED     Status: Abnormal   Collection Time: 02/14/17  6:43 PM  Result Value Ref Range   Troponin i, poc 0.14 (HH) 0.00 - 0.08 ng/mL   Comment NOTIFIED PHYSICIAN    Comment 3            Comment: Due to the release kinetics of cTnI, a negative result within the first hours of the onset of symptoms does not rule out myocardial infarction with certainty. If myocardial infarction is still  suspected, repeat the test at appropriate intervals.    Ct Angio Head W/cm &/or Wo Cm  Result Date: 02/14/2017 CLINICAL DATA:  Stroke.  TPA administered EXAM: CT ANGIOGRAPHY HEAD AND NECK TECHNIQUE: Multidetector CT imaging of the head and neck was performed using the standard protocol during bolus administration of intravenous contrast. Multiplanar CT image reconstructions and MIPs were obtained to evaluate the vascular anatomy. Carotid stenosis measurements (when applicable) are obtained utilizing NASCET criteria, using the distal internal carotid diameter as the denominator. CONTRAST:  100 mL Isovue 370 IV COMPARISON:  CT head 02/14/2017 FINDINGS: CTA NECK FINDINGS Aortic arch: Atherosclerotic disease in the aortic arch and proximal great vessels. Two vessel branching arch. Atherosclerotic calcification proximal great vessels without significant stenosis. Right carotid system: Right common carotid artery mildly diseased without significant stenosis. Atherosclerotic calcification right carotid bifurcation without significant stenosis. Left carotid system: Left common carotid artery widely patent. Atherosclerotic calcification left carotid bifurcation without significant stenosis. Vertebral arteries: Right vertebral artery dominant. Right vertebral artery widely patent to the basilar without significant stenosis. Nondominant left vertebral artery also widely patent without significant stenosis. Skeleton: Cervical spine degenerative change. No acute skeletal abnormality. Other neck: Negative for mass or adenopathy Upper chest: Negative Review of the MIP images confirms the above findings CTA HEAD FINDINGS Anterior circulation: Mild atherosclerotic calcification cavernous carotid bilaterally without significant stenosis. Mild atherosclerotic disease M1 segment bilaterally. Atherosclerotic irregularity in the M2  branches bilaterally. No large vessel occlusion. Posterior circulation: Both vertebral arteries  patent to the basilar. PICA patent bilaterally. Basilar widely patent. Mild ectasia of the basilar with tortuosity. Superior cerebellar arteries patent bilaterally. Moderate stenosis P2 segment bilaterally. Venous sinuses: Patent Anatomic variants: None Delayed phase: Normal enhancement on delayed imaging. Atrophy with diffuse small vessel ischemia. Review of the MIP images confirms the above findings IMPRESSION: No significant carotid or vertebral artery stenosis in the neck Diffuse intracranial atherosclerotic disease including the middle cerebral arteries bilaterally. Moderate stenosis P2 segment bilaterally in the posterior cerebral arteries. No intracranial large vessel occlusion. No intracranial hemorrhage identified. Electronically Signed   By: Franchot Gallo M.D.   On: 02/14/2017 19:55   Ct Angio Neck W And/or Wo Contrast  Result Date: 02/14/2017 CLINICAL DATA:  Stroke.  TPA administered EXAM: CT ANGIOGRAPHY HEAD AND NECK TECHNIQUE: Multidetector CT imaging of the head and neck was performed using the standard protocol during bolus administration of intravenous contrast. Multiplanar CT image reconstructions and MIPs were obtained to evaluate the vascular anatomy. Carotid stenosis measurements (when applicable) are obtained utilizing NASCET criteria, using the distal internal carotid diameter as the denominator. CONTRAST:  100 mL Isovue 370 IV COMPARISON:  CT head 02/14/2017 FINDINGS: CTA NECK FINDINGS Aortic arch: Atherosclerotic disease in the aortic arch and proximal great vessels. Two vessel branching arch. Atherosclerotic calcification proximal great vessels without significant stenosis. Right carotid system: Right common carotid artery mildly diseased without significant stenosis. Atherosclerotic calcification right carotid bifurcation without significant stenosis. Left carotid system: Left common carotid artery widely patent. Atherosclerotic calcification left carotid bifurcation without  significant stenosis. Vertebral arteries: Right vertebral artery dominant. Right vertebral artery widely patent to the basilar without significant stenosis. Nondominant left vertebral artery also widely patent without significant stenosis. Skeleton: Cervical spine degenerative change. No acute skeletal abnormality. Other neck: Negative for mass or adenopathy Upper chest: Negative Review of the MIP images confirms the above findings CTA HEAD FINDINGS Anterior circulation: Mild atherosclerotic calcification cavernous carotid bilaterally without significant stenosis. Mild atherosclerotic disease M1 segment bilaterally. Atherosclerotic irregularity in the M2 branches bilaterally. No large vessel occlusion. Posterior circulation: Both vertebral arteries patent to the basilar. PICA patent bilaterally. Basilar widely patent. Mild ectasia of the basilar with tortuosity. Superior cerebellar arteries patent bilaterally. Moderate stenosis P2 segment bilaterally. Venous sinuses: Patent Anatomic variants: None Delayed phase: Normal enhancement on delayed imaging. Atrophy with diffuse small vessel ischemia. Review of the MIP images confirms the above findings IMPRESSION: No significant carotid or vertebral artery stenosis in the neck Diffuse intracranial atherosclerotic disease including the middle cerebral arteries bilaterally. Moderate stenosis P2 segment bilaterally in the posterior cerebral arteries. No intracranial large vessel occlusion. No intracranial hemorrhage identified. Electronically Signed   By: Franchot Gallo M.D.   On: 02/14/2017 19:55   Ct Head Code Stroke Wo Contrast  Result Date: 02/14/2017 CLINICAL DATA:  Code stroke. Altered mental status, speech difficulties. History of migraine, stroke, atrial fibrillation. EXAM: CT HEAD WITHOUT CONTRAST TECHNIQUE: Contiguous axial images were obtained from the base of the skull through the vertex without intravenous contrast. COMPARISON:  CT HEAD June 22, 2016  FINDINGS: BRAIN: No intraparenchymal hemorrhage, mass effect nor midline shift. The ventricles and sulci are normal for age. Patchy to confluent supratentorial white matter hypodensities. New age indeterminate RIGHT basal ganglia lacunar infarct Old RIGHT thalamus cystic old small RIGHT cerebellar infarct. Next item moderate to cysts Infarct though, new from January 2018. Old RIGHT superior cerebellar small infarct. No acute large vascular  territory infarcts. No abnormal extra-axial fluid collections. Basal cisterns are patent. VASCULAR: Moderate calcific atherosclerosis of the carotid siphons. Calcified LEFT MCA, unchanged. Dolichoectatic intracranial vessels seen with chronic hypertension. SKULL: No skull fracture. Subcentimeter mildly expansile probable epidermal inclusion cyst LEFT temporal bone. No significant scalp soft tissue swelling. SINUSES/ORBITS: The mastoid air-cells and included paranasal sinuses are well-aerated.The included ocular globes and orbital contents are non-suspicious. OTHER: None. ASPECTS ([place] Stroke Program Early CT Score) - Ganglionic level infarction (caudate, lentiform nuclei, internal capsule, insula, M1-M3 cortex): 7 - Supraganglionic infarction (M4-M6 cortex): 3 Total score (0-10 with 10 being normal): 10 IMPRESSION: 1. New age indeterminate RIGHT basal ganglia lacunar infarct. 2. New nonacute RIGHT thalamus infarct. 3. Old small RIGHT cerebellar infarct. Stable moderate to severe chronic small vessel ischemic disease. 4. ASPECTS is 10. 5. Critical Value/emergent results were called by telephone at the time of interpretation on 02/14/2017 at 6:30 pm to Dr. Fredia Sorrow , who verbally acknowledged these results. Electronically Signed   By: Elon Alas M.D.   On: 02/14/2017 18:32    Assessment: 83 y.o. male with multiple risk factors for stroke as well as previous stroke presenting with acute left MCA territory ischemic infarction. He was administered IV TPA and strength  of his right extremities has markedly improved. He still confused, however and residual aphasia cannot be ruled out. Blood pressure is also elevated requiring IV Cardene for management.  Stroke Risk Factors - atrial fibrillation, hyperlipidemia and hypertension  Plan: 1. Admission to neuro ICU for management per post TPA protocol 2. MRI of the brain without contrast 3. PT consult, OT consult, Speech consult 4. Echocardiogram 5. HgbA1c, fasting lipid panel 6. Prophylactic therapy - to be determined by Stroke Team 7. Risk factor modification 8. Telemetry monitoring  This patient is critically ill and at significant risk of neurological worsening or death, and care requires constant monitoring of vital signs, hemodynamics,respiratory and cardiac monitoring, neurological assessment, discussion with family, other specialists and medical decision making of high complexity. Total critical care time was 60 minutes.  C.R. Nicole Kindred, MD [facility] [phone]  02/14/2017, 9:47 PM

## 2017-02-14 NOTE — ED Notes (Addendum)
Pt daughter, POA, stated pt taken off of plavix after recent fall due to hematuria on 02/07/17 . Pt daughter also reports past history of stroke.   Triage screening completed per Pt daughter.

## 2017-02-14 NOTE — ED Notes (Signed)
Report given to [facility], 4N.

## 2017-02-14 NOTE — ED Notes (Signed)
Pt becoming increasingly agitated at this time, BP readings increasing. MD Zackowski notified.

## 2017-02-14 NOTE — ED Notes (Signed)
Pt in CT at this time. EDP aware and cleared pt at arrival. [id number].

## 2017-02-14 NOTE — ED Notes (Signed)
Pt back in room at this time.

## 2017-02-14 NOTE — ED Notes (Signed)
Pt began having hematuria at this time, notified MD Zackowski. No new orders at this time.

## 2017-02-14 NOTE — ED Notes (Signed)
Dr. Dorann Lodge with radiology sent to Dr. Rogene Houston at [phone].

## 2017-02-14 NOTE — ED Notes (Signed)
Attempted to call report at this time, no answer.

## 2017-02-14 NOTE — ED Notes (Signed)
Pt transferred onto [facility] stretcher at this time.

## 2017-02-14 NOTE — ED Notes (Signed)
Radiologist reported that CT showed basal ganglia lacunar infarct. EDP at bedside and aware of results.   SOC on camera at this time.

## 2017-02-14 NOTE — ED Notes (Signed)
SOC came on to check video and sound. Pt remains in CT.

## 2017-02-14 NOTE — ED Notes (Signed)
Called SOC. Cart placed in pt's room at this time.

## 2017-02-14 NOTE — ED Provider Notes (Signed)
[facility] DEPT Provider Note   CSN: [id number] Arrival date & time: 02/14/17  1813     History   Chief Complaint Chief Complaint  Patient presents with  . Code Stroke    HPI NICOLE HAFLEY is a 83 y.o. male.  Patient brought in by EMS. Patient potential code stroke patient. Patient was having dinner being fed with assistance from his nurse at 1745 had sudden onset of unresponsiveness became nonverbal stare off to the left side. And they noticed he was not using his right arm or leg. Upon arrival here patient did have movement in his right arm which seemed to be spontaneous. Left side was moving spontaneously. Had a gaze to the left. Seemed to neglect anything on the right side. Was not moving right leg. Was not able to verbalize but was moaning. Code stroke initiated.      Past Medical History:  Diagnosis Date  . Afib ([place])   . Atrial fibrillation ([place])   . Cerebral infarction ([place])   . DDD (degenerative disc disease), lumbar   . Dysphagia   . Hypertension   . Hypothyroidism   . Migraine    "maybe monthly" (11/21/2015)  . Prostate cancer ([place]) ~ 1995   S/P radiation tx  . Renal mass    "slow growing something; doctors just watching it right now" (11/21/2015)  . Stroke [facility])    TIA  . Thrombocytopenia ([place])   . Thyroid disease    hypothyroid    Patient Active Problem List   Diagnosis Date Noted  . 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 Acute CVA (cerebrovascular accident) ([place]) 05/22/2016  . TIA (transient ischemic attack) 05/21/2016  . Left leg weakness   . Leukopenia   . Thrombocytopenia ([place])   . Fall   . Acute blood loss anemia   . Femoral neck fracture ([place]) 11/20/2015  . Renal malignant tumor ([place]) 06/06/2015  . Long-term (current) use of anticoagulants 04/29/2015  . Subclinical hypothyroidism 03/01/2014  . Atrial fibrillation ([place]) 06/09/2013  . STRESS FRACTURE, FOOT 12/12/2008  . CLOSED FRACTURE OF METATARSAL BONE  12/12/2008    Past Surgical History:  Procedure Laterality Date  . COLONOSCOPY  1999  . ESOPHAGOGASTRODUODENOSCOPY    . LAPAROSCOPIC CHOLECYSTECTOMY  1980s  . PROSTATE BIOPSY  ~ 1995  . TOTAL HIP ARTHROPLASTY Left 11/22/2015   Procedure: LEFT HIP HEMIARTHROPLASTY ;  Surgeon: Leandrew Koyanagi, MD;  Location: [facility];  Service: Orthopedics;  Laterality: Left;       Home Medications    Prior to Admission medications   Medication Sig Start Date End Date Taking? Authorizing Provider  acetaminophen (TYLENOL) 325 MG tablet Take 325-650 mg by mouth every 6 (six) hours as needed for mild pain or moderate pain.    [provider]  amLODipine (NORVASC) 2.5 MG tablet Take 1 tablet (2.5 mg total) by mouth daily. 07/08/16   Kathyrn Drown, MD  apixaban (ELIQUIS) 5 MG TABS tablet TAKE (1) TABLET BY MOUTH TWICE DAILY. 07/08/16   Kathyrn Drown, MD  cephALEXin (KEFLEX) 500 MG capsule Take 1 capsule (500 mg total) by mouth 4 (four) times daily. 02/07/17   Francine Graven, DO  cetirizine (ZYRTEC) 10 MG tablet Take 10 mg by mouth every evening.     [provider]  levothyroxine (SYNTHROID, LEVOTHROID) 25 MCG tablet TAKE ONE TABLET EVERY MORNING. 01/22/17   Kathyrn Drown, MD  metoprolol succinate (TOPROL-XL) 25 MG 24 hr tablet Take 1 tablet (25  mg total) by mouth daily. Patient taking differently: Take 25 mg by mouth every evening.  07/08/16   Kathyrn Drown, MD  polyethylene glycol powder (GLYCOLAX/MIRALAX) powder MIX 1 CAPFUL IN 8 OZ OF WATER AND DRINK DAILY. Patient taking differently: MIX 1 CAPFUL IN 8 OZ OF WATER AND DRINK EVERY OTHER DAY 01/05/17   Kathyrn Drown, MD    Family History Family History  Problem Relation Age of Onset  . Stroke Sister        in her 94s  . CAD Neg Hx   . COPD Neg Hx   . Diabetes Neg Hx   . Heart disease Neg Hx     Social History Social History  Substance Use Topics  . Smoking status: Former Research scientist (life sciences)  . Smokeless tobacco: Never Used     Comment:  11/21/2015 "might have smoked 2 packs of cigarettes in my lifetime"  . Alcohol use Yes     Comment: 11/21/2015 "last beer I've had was in ~ 2007"     Allergies   Xanax [alprazolam] and Tape   Review of Systems Review of Systems  Unable to perform ROS: Patient nonverbal     Physical Exam Updated Vital Signs BP (!) 160/74   Pulse 86   Temp 97.9 F (36.6 C) (Oral)   Resp 17   Ht 1.676 m (5\' 6" )   Wt 70.3 kg (155 lb)   SpO2 93%   BMI 25.02 kg/m   Physical Exam  Constitutional: He appears well-developed and well-nourished. No distress.  HENT:  Head: Normocephalic and atraumatic.  Mouth/Throat: Oropharynx is clear and moist.  Eyes:  Patient with gaze to the left side. But when you approach him on his left side eyes will move but not past midline to the right.  Neck: Neck supple.  Cardiovascular: Normal rate.   Irregular  Pulmonary/Chest: Effort normal and breath sounds normal. No respiratory distress.  Abdominal: Soft. Bowel sounds are normal. There is no tenderness.  Musculoskeletal:  Patient with weakness to right arm and right leg greater on the right leg.  Neurological: A cranial nerve deficit is present. He exhibits abnormal muscle tone.  Mild to moderate hemiparesis on the right side. A fascia. And neglect of the right side.  Skin: Skin is warm.  Nursing note and vitals reviewed.    ED Treatments / Results  Labs (all labs ordered are listed, but only abnormal results are displayed) Labs Reviewed  APTT - Abnormal; Notable for the following:       Result Value   aPTT 41 (*)    All other components within normal limits  COMPREHENSIVE METABOLIC PANEL - Abnormal; Notable for the following:    Glucose, Bld 131 (*)    ALT 13 (*)    All other components within normal limits  I-STAT TROPONIN, ED - Abnormal; Notable for the following:    Troponin i, poc 0.14 (*)    All other components within normal limits  CBG MONITORING, ED - Abnormal; Notable for the following:     Glucose-Capillary 117 (*)    All other components within normal limits  I-STAT CHEM 8, ED - Abnormal; Notable for the following:    Glucose, Bld 126 (*)    All other components within normal limits  PROTIME-INR  CBC  DIFFERENTIAL    EKG  EKG Interpretation None       Radiology Ct Head Code Stroke Wo Contrast  Result Date: 02/14/2017 CLINICAL DATA:  Code stroke. Altered mental  status, speech difficulties. History of migraine, stroke, atrial fibrillation. EXAM: CT HEAD WITHOUT CONTRAST TECHNIQUE: Contiguous axial images were obtained from the base of the skull through the vertex without intravenous contrast. COMPARISON:  CT HEAD June 22, 2016 FINDINGS: BRAIN: No intraparenchymal hemorrhage, mass effect nor midline shift. The ventricles and sulci are normal for age. Patchy to confluent supratentorial white matter hypodensities. New age indeterminate RIGHT basal ganglia lacunar infarct Old RIGHT thalamus cystic old small RIGHT cerebellar infarct. Next item moderate to cysts Infarct though, new from January 2018. Old RIGHT superior cerebellar small infarct. No acute large vascular territory infarcts. No abnormal extra-axial fluid collections. Basal cisterns are patent. VASCULAR: Moderate calcific atherosclerosis of the carotid siphons. Calcified LEFT MCA, unchanged. Dolichoectatic intracranial vessels seen with chronic hypertension. SKULL: No skull fracture. Subcentimeter mildly expansile probable epidermal inclusion cyst LEFT temporal bone. No significant scalp soft tissue swelling. SINUSES/ORBITS: The mastoid air-cells and included paranasal sinuses are well-aerated.The included ocular globes and orbital contents are non-suspicious. OTHER: None. ASPECTS ([place] Stroke Program Early CT Score) - Ganglionic level infarction (caudate, lentiform nuclei, internal capsule, insula, M1-M3 cortex): 7 - Supraganglionic infarction (M4-M6 cortex): 3 Total score (0-10 with 10 being normal): 10 IMPRESSION:  1. New age indeterminate RIGHT basal ganglia lacunar infarct. 2. New nonacute RIGHT thalamus infarct. 3. Old small RIGHT cerebellar infarct. Stable moderate to severe chronic small vessel ischemic disease. 4. ASPECTS is 10. 5. Critical Value/emergent results were called by telephone at the time of interpretation on 02/14/2017 at 6:30 pm to Dr. Fredia Sorrow , who verbally acknowledged these results. Electronically Signed   By: Elon Alas M.D.   On: 02/14/2017 18:32    Procedures Procedures (including critical care time)   CRITICAL CARE Performed by: Fredia Sorrow Total critical care time: 60  minutes Critical care time was exclusive of separately billable procedures and treating other patients. Critical care was necessary to treat or prevent imminent or life-threatening deterioration. Critical care was time spent personally by me on the following activities: development of treatment plan with patient and/or surrogate as well as nursing, discussions with consultants, evaluation of patient's response to treatment, examination of patient, obtaining history from patient or surrogate, ordering and performing treatments and interventions, ordering and review of laboratory studies, ordering and review of radiographic studies, pulse oximetry and re-evaluation of patient's condition.  Medications Ordered in ED Medications  alteplase (ACTIVASE) 1 mg/mL injection (not administered)  alteplase (ACTIVASE) 1 mg/mL infusion 63 mg (63 mg Intravenous New Bag/Given 02/14/17 1853)    Followed by  0.9 %  sodium chloride infusion (not administered)     Initial Impression / Assessment and Plan / ED Course  I have reviewed the triage vital signs and the nursing notes.  Pertinent labs & imaging results that were available during my care of the patient were reviewed by me and considered in my medical decision making (see chart for details).    Patient arrived as potential code stroke. Admitted to [facility].  Code stroke initiated. Patient with right-sided weakness and aphasia. CT head without evidence of bleed. Patient had been on helical wasn't until a week ago when it was stopped following a fall and he had some blood in his urine. He has a history of atrial fibrillation. Neurology by telemetry evaluated patient discussed with family TPA recommended per probable large left-sided CVA. Family agreed risk were explained. TPA initiated. Discussed with neurology at [facility] Dr. Nicole Kindred patient will be transported there to the ED for further evaluation and possible interventional  radiology. CT angiogram of head and neck ordered here. [facility] will be transporting.   Patient's cardiac monitor was consistent with A. fib. Shortly after initiating TPA patient started to verbalize some and was starting to use his right side more.      Final Clinical Impressions(s) / ED Diagnoses   Final diagnoses:  Cerebrovascular accident (CVA), unspecified mechanism ([place])    New Prescriptions New Prescriptions   No medications on file     Fredia Sorrow, MD 02/14/17 1941

## 2017-02-14 NOTE — ED Triage Notes (Addendum)
Pt brought in via EMS, per EMS pt was eating dinner and became unresponsive. At time of EMS arrival pt alert, right sided weakness initially, RLE weakness noted at arrival to ED. Gaze deviated to left. Speech slurred.

## 2017-02-14 NOTE — ED Notes (Signed)
Dr. Rogene Houston notified of elevated troponin

## 2017-02-14 NOTE — ED Notes (Signed)
Code Stroke paged and CT called.

## 2017-02-15 ENCOUNTER — Ambulatory Visit: Payer: Medicare Other | Admitting: Family Medicine

## 2017-02-15 ENCOUNTER — Inpatient Hospital Stay (HOSPITAL_COMMUNITY): Payer: Medicare Other

## 2017-02-15 DIAGNOSIS — I10 Essential (primary) hypertension: Secondary | ICD-10-CM

## 2017-02-15 DIAGNOSIS — E785 Hyperlipidemia, unspecified: Secondary | ICD-10-CM

## 2017-02-15 DIAGNOSIS — I350 Nonrheumatic aortic (valve) stenosis: Secondary | ICD-10-CM

## 2017-02-15 DIAGNOSIS — R310 Gross hematuria: Secondary | ICD-10-CM

## 2017-02-15 DIAGNOSIS — I63412 Cerebral infarction due to embolism of left middle cerebral artery: Principal | ICD-10-CM

## 2017-02-15 DIAGNOSIS — Z8673 Personal history of transient ischemic attack (TIA), and cerebral infarction without residual deficits: Secondary | ICD-10-CM

## 2017-02-15 DIAGNOSIS — I482 Chronic atrial fibrillation: Secondary | ICD-10-CM

## 2017-02-15 LAB — LIPID PANEL
CHOL/HDL RATIO: 3.5 ratio
Cholesterol: 150 mg/dL (ref 0–200)
HDL: 43 mg/dL (ref >40)
LDL CALC: 96 mg/dL (ref 0–99)
Triglycerides: 53 mg/dL (ref <150)
VLDL: 11 mg/dL (ref 0–40)

## 2017-02-15 LAB — ECHOCARDIOGRAM COMPLETE
HEIGHTINCHES: 70 in
WEIGHTICAEL: 2589.08 [oz_av]

## 2017-02-15 LAB — HEMOGLOBIN A1C
Hgb A1c MFr Bld: 5.4 % (ref 4.8–5.6)
Mean Plasma Glucose: 108.28 mg/dL

## 2017-02-15 LAB — TROPONIN I: TROPONIN I: 0.2 ng/mL — AB (ref <0.03)

## 2017-02-15 LAB — MRSA PCR SCREENING: MRSA BY PCR: NEGATIVE

## 2017-02-15 MED ORDER — CHLORHEXIDINE GLUCONATE 0.12 % MT SOLN
15.0000 mL | Freq: Two times a day (BID) | OROMUCOSAL | Status: DC
  Administered 2017-02-15 – 2017-02-19 (×7): 15 mL via OROMUCOSAL
  Filled 2017-02-15 (×5): qty 15

## 2017-02-15 MED ORDER — NICARDIPINE HCL IN NACL 20-0.86 MG/200ML-% IV SOLN
3.0000 mg/h | INTRAVENOUS | Status: DC
  Administered 2017-02-15: 3 mg/h via INTRAVENOUS
  Filled 2017-02-15 (×2): qty 200

## 2017-02-15 MED ORDER — AMLODIPINE BESYLATE 2.5 MG PO TABS
2.5000 mg | ORAL_TABLET | Freq: Every day | ORAL | Status: DC
  Administered 2017-02-17 – 2017-02-18 (×2): 2.5 mg via ORAL
  Filled 2017-02-15 (×2): qty 1

## 2017-02-15 MED ORDER — INFLUENZA VAC SPLIT HIGH-DOSE 0.5 ML IM SUSY
0.5000 mL | PREFILLED_SYRINGE | INTRAMUSCULAR | Status: AC | PRN
  Administered 2017-02-19: 0.5 mL via INTRAMUSCULAR
  Filled 2017-02-15: qty 0.5

## 2017-02-15 MED ORDER — ORAL CARE MOUTH RINSE
15.0000 mL | Freq: Two times a day (BID) | OROMUCOSAL | Status: DC
  Administered 2017-02-15 – 2017-02-19 (×8): 15 mL via OROMUCOSAL

## 2017-02-15 MED ORDER — LEVOTHYROXINE SODIUM 25 MCG PO TABS
25.0000 ug | ORAL_TABLET | Freq: Every day | ORAL | Status: DC
  Administered 2017-02-17 – 2017-02-19 (×3): 25 ug via ORAL
  Filled 2017-02-15 (×3): qty 1

## 2017-02-15 MED ORDER — METOPROLOL SUCCINATE ER 25 MG PO TB24
25.0000 mg | ORAL_TABLET | Freq: Every evening | ORAL | Status: DC
  Administered 2017-02-16 – 2017-02-19 (×4): 25 mg via ORAL
  Filled 2017-02-15 (×4): qty 1

## 2017-02-15 NOTE — Progress Notes (Signed)
PT Cancellation Note  Patient Details Name: Ethan Faulkner MRN: [id number] DOB: [birth year]   Cancelled Treatment:    Reason Eval/Treat Not Completed: Patient not medically ready. Pt currently on bedrest. Will await increased activity orders prior to initiating PT eval.   Thelma Comp 02/15/2017, 7:04 AM   Rolinda Roan, PT, DPT Acute Rehabilitation Services Pager: [phone]

## 2017-02-15 NOTE — Progress Notes (Signed)
Cortrak Tube Team Note:  Consult received to place a Cortrak feeding tube.   Attempted to place Cortrak feeding tube x4. Pt very agitated pulling off Cortrak sensor and the feeding tube. RN and Nurse Tech came in to help contain pt's arms but still could not advance tube. Pt's skin on arms very fragile. To prevent any more tearing upon agitation, stopped placement. If tube feeding still warranted, recommend IR placement.    M[place]a Single RD, LDN Clinical Nutrition Pager # [phone]

## 2017-02-15 NOTE — Progress Notes (Signed)
OT Cancellation Note  Patient Details Name: Ethan Faulkner MRN: [id number] DOB: Feb 17, 1916   Cancelled Treatment:    Reason Eval/Treat Not Completed: Patient not medically ready (bedrest)  Peri Maris  [phone] 02/15/2017, 7:22 AM

## 2017-02-15 NOTE — Progress Notes (Signed)
  Echocardiogram 2D Echocardiogram has been performed.  Victor Langenbach L Androw 02/15/2017, 8:37 AM

## 2017-02-15 NOTE — Progress Notes (Signed)
STROKE TEAM PROGRESS NOTE   SUBJECTIVE (INTERVAL HISTORY) His daughters are at the bedside. Pt able to move right UE and LE more but still has left gaze and right neglect and global aphasia. Did not pass swallow and will need NG tube. However, family is thinking about palliative care in a couple of days if pt not improve.    OBJECTIVE Temp:  [97.7 F (36.5 C)-99.1 F (37.3 C)] 99.1 F (37.3 C) (09/24 0800) Pulse Rate:  [48-126] 79 (09/24 1200) Cardiac Rhythm: Atrial fibrillation (09/24 0800) Resp:  [13-41] 20 (09/24 1200) BP: (113-263)/(63-156) 144/73 (09/24 1200) SpO2:  [84 %-100 %] 97 % (09/24 1200) Weight:  [155 lb (70.3 kg)-161 lb 13.1 oz (73.4 kg)] 161 lb 13.1 oz (73.4 kg) (09/23 2130)   Recent Labs Lab 02/14/17 1833  GLUCAP 117*    Recent Labs Lab 02/14/17 1825 02/14/17 1840  NA 139 141  K 4.2 4.4  CL 104 104  CO2 26  --   GLUCOSE 131* 126*  BUN 16 17  CREATININE 0.96 1.00  CALCIUM 9.3  --     Recent Labs Lab 02/14/17 1825  AST 23  ALT 13*  ALKPHOS 59  BILITOT 0.9  PROT 7.3  ALBUMIN 4.0    Recent Labs Lab 02/14/17 1825 02/14/17 1840  WBC 5.4  --   NEUTROABS 2.3  --   HGB 14.2 14.3  HCT 41.2 42.0  MCV 93.4  --   PLT 179  --     Recent Labs Lab 02/14/17 2340 02/15/17 1018  TROPONINI <0.03 0.20*    Recent Labs  02/14/17 1825  LABPROT 13.9  INR 1.08   No results for input(s): COLORURINE, LABSPEC, PHURINE, GLUCOSEU, HGBUR, BILIRUBINUR, KETONESUR, PROTEINUR, UROBILINOGEN, NITRITE, LEUKOCYTESUR in the last 72 hours.  Invalid input(s): APPERANCEUR     Component Value Date/Time   CHOL 150 02/15/2017 1018   TRIG 53 02/15/2017 1018   HDL 43 02/15/2017 1018   CHOLHDL 3.5 02/15/2017 1018   VLDL 11 02/15/2017 1018   LDLCALC 96 02/15/2017 1018   Lab Results  Component Value Date   HGBA1C 5.4 02/15/2017      Component Value Date/Time   LABOPIA NONE DETECTED 05/21/2016 2034   COCAINSCRNUR NONE DETECTED 05/21/2016 2034   LABBENZ NONE  DETECTED 05/21/2016 2034   AMPHETMU NONE DETECTED 05/21/2016 2034   THCU NONE DETECTED 05/21/2016 2034   LABBARB NONE DETECTED 05/21/2016 2034    No results for input(s): ETH in the last 168 hours.  I have personally reviewed the radiological images below and agree with the radiology interpretations.  Ct Angio Head and neck W/cm &/or Wo Cm 02/14/2017 IMPRESSION: No significant carotid or vertebral artery stenosis in the neck Diffuse intracranial atherosclerotic disease including the middle cerebral arteries bilaterally. Moderate stenosis P2 segment bilaterally in the posterior cerebral arteries. No intracranial large vessel occlusion. No intracranial hemorrhage identified.  Ct Head Code Stroke Wo Contrast 02/14/2017 IMPRESSION: 1. New age indeterminate RIGHT basal ganglia lacunar infarct. 2. New nonacute RIGHT thalamus infarct. 3. Old small RIGHT cerebellar infarct. Stable moderate to severe chronic small vessel ischemic disease. 4. ASPECTS is 10.   MRI pending  2D Echocardiogram  pending   PHYSICAL EXAM  Temp:  [97.7 F (36.5 C)-99.1 F (37.3 C)] 99.1 F (37.3 C) (09/24 0800) Pulse Rate:  [48-126] 79 (09/24 1200) Resp:  [13-41] 20 (09/24 1200) BP: (113-263)/(63-156) 144/73 (09/24 1200) SpO2:  [84 %-100 %] 97 % (09/24 1200) Weight:  [155 lb (70.3 kg)-161  lb 13.1 oz (73.4 kg)] 161 lb 13.1 oz (73.4 kg) (09/23 2130)  General - Well nourished, well developed, sleepy and lethargic  Ophthalmologic - not cooperation on exam  Cardiovascular - irregularly irregular heart rate and rhythm..  Neuro - sleepy and lethargic, able to open eyes with repetitive voice stimulation or pain. Global aphasia, not following commands, no speech output. Right neglect. Eyes left gaze preference and not able to cross midline. PERRL, facial symmetric, not blinking to visual threat on the right. Tongue middle in mouth. Spontaneous moving all extremities, however, weaker at RUE and RLE comparing with left. RUE  and RLE 3/5, and RUE and RLE 4/5 on testing. Positive babinski on the right. Sensation, coordination and gait not tested.   ASSESSMENT/PLAN Ethan Faulkner is a 83 y.o. male with history of afib off eliquis for a week due to hematuria, HTN, prostate cancer and renal mass, previous stroke in 04/2016 admitted for aphasia, left gaze and right side weakness. S/p tPA. Some motor improvement but still left gaze and global aphasia.    Stroke:  left MCA infarct - embolic secondary to Afib off eliquis due to hematuria  MRI  pending  CTA head and neck - diffuse intracranial stenosis especially b/l P2  2D Echo  pending  LDL 96  HgbA1c 5.4  SCDs for VTE prophylaxis  Diet NPO time specified   No antithrombotic prior to admission, now on No antithrombotic within 24h of tPA  Ongoing aggressive stroke risk factor management  Therapy recommendations:  pending  Disposition:  I had long discussion with daughters at bedside, updated pt current condition, treatment plan and potential prognosis. They expressed that pt would not want to live in situation that has low quality of life. They would like to consider palliative care in the next couple of days if not significant improvement.   Afib off eliquis due to hematuria  Hematuria one week ago  Off eliquis for one week  Foley catheter placed  Urine became clear last week  Plan to follow up with PCP and urology today for plan of foley catheter  Hematuria  One week ago, s/p foley and off eliquis  Hematuria resolve over time  However, recurred after tPA  Continue foley for now  H&H monitoring  Hypertension  Home meds:   Norvasc, metoprolol Permissive hypertension (OK if <180/105) for 24-48 hours post stroke and then gradually normalized within 5-7 days. Currently on cardene  Stable  Will resume home meds after NG tube  Hyperlipidemia  Home meds:  none   LDL 96, goal < 70  Add statin once po access  Continue statin  at discharge  Dysphagia  Did not pass swallow  Family requested NG tube  Will put in cotrak for nutrition and meds  Other Stroke Risk Factors  Advanced age  Hx stroke/TIA - 04/2016 - right IC/BG infarct - continued eliquis  Migraines  Other Active Problems  Prostate cancer s/p rad and chemo  Renal mass - continue observation  Other Pertinent History  Elevated troponin - 0.03->0.20 - likely demand ischemia due to stroke - continue to cycle troponin  Hospital day # 1  This patient is critically ill due to left MCA stroke, s/p tPA, afib not on AC, hematuria and at significant risk of neurological worsening, death form recurrent stroke, hemorrhagic conversion, heart failure, seizure, anemia, spesis. This patient's care requires constant monitoring of vital signs, hemodynamics, respiratory and cardiac monitoring, review of multiple databases, neurological assessment, discussion with family,  other specialists and medical decision making of high complexity. I spent 40 minutes of neurocritical care time in the care of this patient.   Rosalin Hawking, MD PhD Stroke Neurology 02/15/2017 12:08 PM    To contact Stroke Continuity provider, please refer to [email or web address] After hours, contact General Neurology

## 2017-02-15 NOTE — Progress Notes (Signed)
02/15/2017 I spoke with Dr. Erlinda Hong, who would like to give pt one more day (today) of bedrest before mobilizing.  PT will check back tomorrow.  Thanks,  Barbarann Ehlers. [place], [place], DPT [phone]

## 2017-02-15 NOTE — Evaluation (Signed)
Clinical/Bedside Swallow Evaluation Patient Details  Name: Ethan Faulkner MRN: [id number] Date of Birth: June 15, 1915  Today's Date: 02/15/2017 Time: SLP Start Time (ACUTE ONLY): 8469 SLP Stop Time (ACUTE ONLY): 6295 SLP Time Calculation (min) (ACUTE ONLY): 30 min  Past Medical History:  Past Medical History:  Diagnosis Date  . Afib ([place])   . Atrial fibrillation ([place])   . Cerebral infarction ([place])   . DDD (degenerative disc disease), lumbar   . Dysphagia   . Hypertension   . Hypothyroidism   . Migraine    "maybe monthly" (11/21/2015)  . Prostate cancer ([place]) ~ 1995   S/P radiation tx  . Renal mass    "slow growing something; doctors just watching it right now" (11/21/2015)  . Stroke [facility])    TIA  . Thrombocytopenia ([place])   . Thyroid disease    hypothyroid   Past Surgical History:  Past Surgical History:  Procedure Laterality Date  . COLONOSCOPY  1999  . ESOPHAGOGASTRODUODENOSCOPY    . LAPAROSCOPIC CHOLECYSTECTOMY  1980s  . PROSTATE BIOPSY  ~ 1995  . TOTAL HIP ARTHROPLASTY Left 11/22/2015   Procedure: LEFT HIP HEMIARTHROPLASTY ;  Surgeon: Leandrew Koyanagi, MD;  Location: [facility];  Service: Orthopedics;  Laterality: Left;   HPI:  Pt is an 83 y.o.malewith a history of atrial fibrillation and recently discontinued anticoagulation, hypertension, dysphagia, hyperlipidemia and old stroke, brought to the ED at [facility] with acute onset of inability to speak and staring to his left side along with weakness involving right arm and right leg. He was noted to have findings indicative of acute left MCA territory ischemic infarction. CT scan of his head showed no acute intracranial abnormality; new age indeterminate right basal ganglia lacunar infarct, new nonacute right thalamus infarct, old small right cerebellar infarct and stable moderate to severe chronic small vessel ischemic disease. He was deemed a candidate for IV TPA which was administered. Modified barium swallow study  performed on 06/08/16; results show mild/mod oropharyngeal dysphagia with recommended diet of dys 3 (mech soft) with thin liquids.    Assessment / Plan / Recommendation Clinical Impression  Pt observed with thin liquids via ice chip/cup sips and puree consistencies; demonstrated immediate and delayed weak/ineffective cough following thin liquids. Pt showed the ability to recognize presentation of water however decreased a-p transit noted with audible, reflexive swallow. Pt had poor lingual seal resulting in anterior spillage with thin liquids; oral residue noted with puree. 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 Due to hx of stroke/dysphagia and pt's current medical status, 83 and lethargic state, suspect that there will not be significant improvements in swallow function and concern for aspiration will persist. Discussed options with family and they expressed their desire for temporary alternative means of nutrition with possibility for objective study pending improved medical status/alertness. Will continue to follow for PO trials and potential MBS once pt's medical status improves.    SLP Visit Diagnosis: Dysphagia, unspecified (R13.10)    Aspiration Risk  Severe aspiration risk    Diet Recommendation NPO;Alternative means - temporary   Medication Administration: Via alternative means    Other  Recommendations Oral Care Recommendations: Oral care QID   Follow up Recommendations Skilled Nursing facility;Home health SLP      Frequency and Duration min 3x week  2 weeks       Prognosis Prognosis for Safe Diet Advancement: Guarded Barriers to Reach Goals: Severity of deficits  Swallow Study   General HPI: Pt is an 83 y.o.malewith a history of atrial fibrillation and recently discontinued anticoagulation, hypertension, dysphagia, hyperlipidemia and old stroke, brought [facility] the ED at [facility]  with acute onset of inability to speak and staring to his left side along with weakness involving right arm and right leg. He was noted to have findings indicative of acute left MCA territory ischemic infarction. CT scan of his head showed no acute intracranial abnormality; new age indeterminate right basal ganglia lacunar infarct, new nonacute right thalamus infarct, old small right cerebellar infarct and stable moderate to severe chronic small vessel ischemic disease. He was deemed a candidate for IV TPA which was administered. Modified barium swallow study performed on 06/08/16; results show mild/mod oropharyngeal dysphagia with recommended diet of dys 3 (mech soft) with thin liquids.  Type of Study: Bedside Swallow Evaluation Previous Swallow Assessment: 06/08/16 - mild/mod oropharyngeal dysphagia Diet Prior to this Study: NPO Temperature Spikes Noted: No Respiratory Status: Room air History of Recent Intubation: No Behavior/Cognition: Lethargic/Drowsy;Doesn't follow directions;Cooperative Oral Care Completed by SLP: No Oral Cavity - Dentition: Edentulous Self-Feeding Abilities: Needs assist Patient Positioning: Upright in bed Baseline Vocal Quality: Not observed Volitional Cough: Cognitively unable to elicit    Oral/Motor/Sensory Function Overall Oral Motor/Sensory Function: Other (comment) (Suspected global aphasia - unable to follow OME commands)   Ice Chips Ice chips: Impaired Presentation: Spoon Oral Phase Impairments: Reduced labial seal Oral Phase Functional Implications: Right anterior spillage;Left anterior spillage Pharyngeal Phase Impairments: Cough - Immediate;Cough - Delayed   Thin Liquid Thin Liquid: Impaired Presentation: Cup;Self Fed (With hand over hand assist) Oral Phase Impairments: Reduced lingual movement/coordination;Reduced labial seal Oral Phase Functional Implications: Right anterior spillage;Left anterior spillage    Nectar Thick Nectar Thick Liquid: Not tested    Honey Thick Honey Thick Liquid: Not tested   Puree Puree: Impaired Presentation: Spoon Oral Phase Impairments: Reduced lingual movement/coordination Oral Phase Functional Implications: Oral residue Pharyngeal Phase Impairments: Cough - Immediate;Cough - Delayed   Solid   GO   Solid: Not tested        Aaron Edelman, Student SLP 02/15/2017,10:13 AM

## 2017-02-15 NOTE — Progress Notes (Signed)
Pt arrived from [facility] ED with BP in 200s/100s, no medications given for BP control. Bilateral IVs not working, STAT IV consult placed for IV access. Pt agitated, unable to obtain accurate BP. Transfer orders for ICU care from [facility] were not received until 2130, therefore BPs from 2045-2115 were manually entered. Pt now has IV access with Cardene administration and BP controlled below 180. Will continue to monitor patient.

## 2017-02-16 ENCOUNTER — Inpatient Hospital Stay (HOSPITAL_COMMUNITY): Payer: Medicare Other

## 2017-02-16 DIAGNOSIS — R1312 Dysphagia, oropharyngeal phase: Secondary | ICD-10-CM

## 2017-02-16 DIAGNOSIS — R748 Abnormal levels of other serum enzymes: Secondary | ICD-10-CM

## 2017-02-16 LAB — BASIC METABOLIC PANEL
ANION GAP: 6 (ref 5–15)
BUN: 12 mg/dL (ref 6–20)
CALCIUM: 8.8 mg/dL — AB (ref 8.9–10.3)
CHLORIDE: 108 mmol/L (ref 101–111)
CO2: 22 mmol/L (ref 22–32)
Creatinine, Ser: 0.83 mg/dL (ref 0.61–1.24)
GFR calc non Af Amer: >60 mL/min (ref >60)
Glucose, Bld: 95 mg/dL (ref 65–99)
POTASSIUM: 3.5 mmol/L (ref 3.5–5.1)
Sodium: 136 mmol/L (ref 135–145)

## 2017-02-16 LAB — CBC
HEMATOCRIT: 38.1 % — AB (ref 39.0–52.0)
Hemoglobin: 12.7 g/dL — ABNORMAL LOW (ref 13.0–17.0)
MCH: 30.9 pg (ref 26.0–34.0)
MCHC: 33.3 g/dL (ref 30.0–36.0)
MCV: 92.7 fL (ref 78.0–100.0)
PLATELETS: 170 10*3/uL (ref 150–400)
RBC: 4.11 MIL/uL — AB (ref 4.22–5.81)
RDW: 13.6 % (ref 11.5–15.5)
WBC: 5.8 10*3/uL (ref 4.0–10.5)

## 2017-02-16 MED ORDER — PANTOPRAZOLE SODIUM 40 MG PO TBEC
40.0000 mg | DELAYED_RELEASE_TABLET | Freq: Every day | ORAL | Status: DC
  Administered 2017-02-16 – 2017-02-19 (×4): 40 mg via ORAL
  Filled 2017-02-16 (×4): qty 1

## 2017-02-16 MED ORDER — ASPIRIN 300 MG RE SUPP
300.0000 mg | Freq: Every day | RECTAL | Status: DC
  Administered 2017-02-16: 300 mg via RECTAL
  Filled 2017-02-16: qty 1

## 2017-02-16 MED ORDER — ASPIRIN EC 325 MG PO TBEC
325.0000 mg | DELAYED_RELEASE_TABLET | Freq: Every day | ORAL | Status: DC
  Administered 2017-02-17 – 2017-02-19 (×3): 325 mg via ORAL
  Filled 2017-02-16 (×3): qty 1

## 2017-02-16 MED ORDER — ENOXAPARIN SODIUM 40 MG/0.4ML ~~LOC~~ SOLN
40.0000 mg | SUBCUTANEOUS | Status: DC
  Administered 2017-02-16 – 2017-02-18 (×3): 40 mg via SUBCUTANEOUS
  Filled 2017-02-16 (×3): qty 0.4

## 2017-02-16 NOTE — Progress Notes (Signed)
Pt transferred to 5M15. Family at bedside, RN getting getting vitals.

## 2017-02-16 NOTE — Evaluation (Signed)
Clinical/Bedside Swallow Evaluation Patient Details  Name: Ethan Faulkner MRN: [id number] Date of Birth: Jul 29, 1915  Today's Date: 02/16/2017 Time: SLP Start Time (ACUTE ONLY): 1406 SLP Stop Time (ACUTE ONLY): 1455 SLP Time Calculation (min) (ACUTE ONLY): 49 min  Past Medical History:  Past Medical History:  Diagnosis Date  . Afib ([place])   . Atrial fibrillation ([place])   . Cerebral infarction ([place])   . DDD (degenerative disc disease), lumbar   . Dysphagia   . Hypertension   . Hypothyroidism   . Migraine    "maybe monthly" (11/21/2015)  . Prostate cancer ([place]) ~ 1995   S/P radiation tx  . Renal mass    "slow growing something; doctors just watching it right now" (11/21/2015)  . Stroke [facility])    TIA  . Thrombocytopenia ([place])   . Thyroid disease    hypothyroid   Past Surgical History:  Past Surgical History:  Procedure Laterality Date  . COLONOSCOPY  1999  . ESOPHAGOGASTRODUODENOSCOPY    . LAPAROSCOPIC CHOLECYSTECTOMY  1980s  . PROSTATE BIOPSY  ~ 1995  . TOTAL HIP ARTHROPLASTY Left 11/22/2015   Procedure: LEFT HIP HEMIARTHROPLASTY ;  Surgeon: Leandrew Koyanagi, MD;  Location: [facility];  Service: Orthopedics;  Laterality: Left;   HPI:  Pt is an 83 y.o.malewith a history of atrial fibrillation and recently discontinued anticoagulation, hypertension, dysphagia, hyperlipidemia and old stroke, brought to the ED at [facility] with acute onset of inability to speak and staring to his left side along with weakness involving right arm and right leg. He was noted to have findings indicative of acute left MCA territory ischemic infarction. CT scan of his head showed no acute intracranial abnormality; new age indeterminate right basal ganglia lacunar infarct, new nonacute right thalamus infarct, old small right cerebellar infarct and stable moderate to severe chronic small vessel ischemic disease. He was deemed a candidate for IV TPA which was administered. Modified barium swallow study  performed on 06/08/16; results show mild/mod oropharyngeal dysphagia with recommended diet of dys 3 (mech soft) with thin liquids.    Assessment / Plan / Recommendation Clinical Impression  BSE reordered today, given improvement in pt status. Pt was seen at bedside, and was noted to be awake, alert, and cooperative. Pt vocalized intermittently, and answered several questions via head nod/shake, but was unable to follow commands consistently. No intelligible words were elicited. Oral care was completed with suction. Strong gag response elicited during oral care. Pt was given trials of thin liquid and puree, and did not exhibit overt s/s aspiration on either consistency. Pt did demonstrate oral holding, delayed posterior propulsion, and oral residue, however, alternating solids and liquids effectively cleared puree from oral cavity. Solid consistencies not attempted at this time due to high risk and oral presentation.   Pt appears safe to begin puree diet and thin liquids, crushed meds. STRICT adherence to posted precautions is imperative for pt safety. These were reviewed with pt/family and RN and posted at [facility]. ST will continue to follow for assessment of diet tolerance and education. Pt's daughter indicated the pt would likely not want long term feeding tube placement.    SLP Visit Diagnosis: Dysphagia, unspecified (R13.10)    Aspiration Risk  Moderate aspiration risk    Diet Recommendation Dysphagia 1 (Puree);Thin liquid   Liquid Administration via: Cup;Straw Supervision: Full supervision/cueing for compensatory strategies;Staff to assist with self feeding Compensations: Minimize environmental distractions;Slow rate;Small sips/bites;Follow solids with liquid Postural Changes: Remain upright for at least 30  minutes after po intake;Seated upright at 90 degrees    Other  Recommendations Oral Care Recommendations: Oral care before and after PO Other Recommendations: Have oral suction available (to  facilitate oral care)   Follow up Recommendations Skilled Nursing facility;Home health SLP;24 hour supervision/assistance      Frequency and Duration min 3x week  2 weeks       Prognosis Prognosis for Safe Diet Advancement: Fair Barriers to Reach Goals: Cognitive deficits;Severity of deficits      Swallow Study   General Date of Onset: 02/14/17 HPI: Pt is an 83 y.o.malewith a history of atrial fibrillation and recently discontinued anticoagulation, hypertension, dysphagia, hyperlipidemia and old stroke, brought to the ED at [facility] with acute onset of inability to speak and staring to his left side along with weakness involving right arm and right leg. He was noted to have findings indicative of acute left MCA territory ischemic infarction. CT scan of his head showed no acute intracranial abnormality; new age indeterminate right basal ganglia lacunar infarct, new nonacute right thalamus infarct, old small right cerebellar infarct and stable moderate to severe chronic small vessel ischemic disease. He was deemed a candidate for IV TPA which was administered. Modified barium swallow study performed on 06/08/16; results show mild/mod oropharyngeal dysphagia with recommended diet of dys 3 (mech soft) with thin liquids.  Type of Study: Bedside Swallow Evaluation Previous Swallow Assessment: 02/15/17 - NPO recommended. 06/08/16 - mild/mod oropharyngeal dysphagia Diet Prior to this Study: NPO Temperature Spikes Noted: No Respiratory Status: Room air History of Recent Intubation: No Behavior/Cognition: Alert;Cooperative;Pleasant mood;Doesn't follow directions Oral Cavity Assessment: Within Functional Limits Oral Care Completed by SLP: Yes Oral Cavity - Dentition: Edentulous Self-Feeding Abilities: Total assist Patient Positioning: Upright in bed Baseline Vocal Quality: Low vocal intensity Volitional Cough: Cognitively unable to elicit Volitional Swallow: Unable to elicit     Oral/Motor/Sensory Function Overall Oral Motor/Sensory Function:  (unable to assess)   Ice Chips Ice chips: Within functional limits Presentation: Spoon Oral Phase Functional Implications: Prolonged oral transit;Oral holding Pharyngeal Phase Impairments: Suspected delayed Swallow   Thin Liquid Thin Liquid: Within functional limits Presentation: Straw Oral Phase Functional Implications: Oral holding;Prolonged oral transit    Nectar Thick Nectar Thick Liquid: Not tested   Honey Thick Honey Thick Liquid: Not tested   Puree Puree: Within functional limits Presentation: Spoon Oral Phase Impairments: Reduced lingual movement/coordination;Poor awareness of bolus Oral Phase Functional Implications: Oral holding;Oral residue;Prolonged oral transit Pharyngeal Phase Impairments: Suspected delayed Swallow   Solid   GO   Solid: Not tested       Celia B. [facility], [place] Speech Language Pathologist [phone]  Shonna Chock 02/16/2017,3:15 PM

## 2017-02-16 NOTE — Therapy (Signed)
Occupational Therapy Evaluation Patient Details Name: Ethan Faulkner MRN: [id number] DOB: 1916-04-16 Today's Date: 02/16/2017    History of Present Illness 83 y.o. male with a history of atrial fibrillation and recently discontinued anticoagulation, hypertension, hyperlipidemia and old stroke, brought to the ED at [facility] with acute onset of inability to speak and staring to his left side along with weakness involving right arm and right leg.    Clinical Impression   Per daughter's report, pt required 24 hour assistance at home to help with ADLs and used a RW with supervision PTA. Currently, pt requires mod assist +2 for sit to stand transfers and max assist +2 for stand pivot transfers. Pt requires total assistance for LB ADLs and peri care and moderate assistance with all other ADL tasks. Daughter reports a caregiver is available 24 hours to assist. Pt would benefit from rehab at SNF prior to return to home to increase independence and functional mobility. OT will follow acutely to address established goals.     Follow Up Recommendations  SNF;Supervision/Assistance - 24 hour    Equipment Recommendations  None recommended by OT (TBD at next venue of care)    Recommendations for Other Services       Precautions / Restrictions Precautions Precautions: Fall Restrictions Weight Bearing Restrictions: No      Mobility Bed Mobility Overal bed mobility: Needs Assistance Bed Mobility: Supine to Sit     Supine to sit: Mod assist;+2 for physical assistance        Transfers Overall transfer level: Needs assistance Equipment used: Rolling walker (2 wheeled) Transfers: Sit to/from [facility] Sit to Stand: Mod assist;+2 physical assistance;+2 safety/equipment Stand pivot transfers: Max assist;+2 physical assistance;+2 safety/equipment       General transfer comment: Pt demonstrated difficulties with motor planning during stand pivot transfers.      Balance Overall balance assessment: Needs assistance Sitting-balance support: Feet supported;No upper extremity supported Sitting balance-Leahy Scale: Poor     Standing balance support: Bilateral upper extremity supported;During functional activity Standing balance-Leahy Scale: Poor Standing balance comment: Required physical support to maintain standing balance                           ADL either performed or assessed with clinical judgement   ADL Overall ADL's : Needs assistance/impaired     Grooming: Moderate assistance;Sitting   Upper Body Bathing: Moderate assistance;Sitting   Lower Body Bathing: Total assistance;Sitting/lateral leans   Upper Body Dressing : Moderate assistance;Sitting   Lower Body Dressing: Total assistance;Sitting/lateral leans   Toilet Transfer: Maximal assistance;+2 for physical assistance;+2 for safety/equipment;Ambulation;RW   Toileting- Clothing Manipulation and Hygiene: Total assistance;+2 for physical assistance;+2 for safety/equipment;Sit to/from stand- Foley    Tub/ Shower Transfer: Maximal assistance;+2 for physical assistance;+2 for safety/equipment;Ambulation;Rolling walker   Functional mobility during ADLs: Maximal assistance;+2 for physical assistance;+2 for safety/equipment;Rolling walker (RW or bilateral UE support )       Vision Baseline Vision/History: Wears glasses Wears Glasses: At all times       Perception     Praxis      Pertinent Vitals/Pain Pain Assessment: No/denies pain      Hand Dominance Right   Extremity/Trunk Assessment Upper Extremity Assessment Upper Extremity Assessment: Generalized weakness   Lower Extremity Assessment Lower Extremity Assessment: Defer to PT evaluation       Communication Communication Communication: Expressive difficulties   Cognition Arousal/Alertness: Awake/alert Behavior During Therapy: Restless Overall Cognitive Status: Impaired/Different  from baseline Area  of Impairment: Following commands                       Following Commands: Follows one step commands inconsistently       General Comments: Diffult to assess due to limited expressive communication skills. Not formally assessed. Pt is able to copy gestures. Appears to perseverate.    General Comments  Daughter present during session and able to provide PLOF.     Exercises     Shoulder Instructions      Home Living Family/patient expects to be discharged to:: Skilled nursing facility                                        Prior Functioning/Environment Level of Independence: Needs assistance  Gait / Transfers Assistance Needed: Pt used a RW  ADL's / Homemaking Assistance Needed: Daughter reports min assist provided for ADL tasks   Comments: Pt lived alone and has 24 hour assistance.         OT Problem List: Decreased strength;Impaired balance (sitting and/or standing);Decreased coordination;Decreased cognition;Decreased safety awareness;Decreased knowledge of use of DME or AE;Decreased knowledge of precautions;Pain      OT Treatment/Interventions: Self-care/ADL training;Therapeutic exercise;Energy conservation;DME and/or AE instruction;Therapeutic activities;Cognitive remediation/compensation;Visual/perceptual remediation/compensation;Patient/family education;Balance training    OT Goals(Current goals can be found in the care plan section) Acute Rehab OT Goals Patient Stated Goal: None stated  OT Goal Formulation: With patient/family Time For Goal Achievement: 03/02/17 Potential to Achieve Goals: Good  OT Frequency: Min 2X/week   Barriers to D/C:            Co-evaluation              AM-PAC PT "6 Clicks" Daily Activity     Outcome Measure Help from another person eating meals?: A Little Help from another person taking care of personal grooming?: A Lot Help from another person toileting, which includes using toliet, bedpan, or urinal?:  Total Help from another person bathing (including washing, rinsing, drying)?: A Lot Help from another person to put on and taking off regular upper body clothing?: A Lot Help from another person to put on and taking off regular lower body clothing?: A Lot 6 Click Score: 12   End of Session Equipment Utilized During Treatment: Gait belt;Rolling walker Nurse Communication: Mobility status  Activity Tolerance: Patient tolerated treatment well Patient left: in chair;with call bell/phone within reach;with chair alarm set;with family/visitor present  OT Visit Diagnosis: Unsteadiness on feet (R26.81);Muscle weakness (generalized) (M62.81);Other symptoms and signs involving cognitive function;Cognitive communication deficit (R41.841)                Time: 7412-8786 OT Time Calculation (min): 31 min Charges:    G-Codes:     Boykin Peek, OTS [phone]   Boykin Peek 02/16/2017, 5:00 PM

## 2017-02-16 NOTE — Progress Notes (Signed)
OT Note - Addendum    02/16/17 1700  OT Visit Information  Last OT Received On 02/16/17  OT Time Calculation  OT Start Time (ACUTE ONLY) 1503  OT Stop Time (ACUTE ONLY) 1534  OT Time Calculation (min) 31 min  OT General Charges  $OT Visit 1 Visit  OT Evaluation  $OT Eval Moderate Complexity 1 [address] OT/L  [phone] 02/16/2017

## 2017-02-16 NOTE — Progress Notes (Signed)
Per Dr Lorraine Lax, holding Day 2 ASA due to persistent blood draining from foley. Will reassess tomorrow.

## 2017-02-16 NOTE — Progress Notes (Signed)
STROKE TEAM PROGRESS NOTE   SUBJECTIVE (INTERVAL HISTORY) His daughter and his sons are at the bedside. Pt more awake and alert, no more eye gaze or neglect, still has expressive aphasia, but able to follow several cental and peripheral commands. Passed swallow today.    OBJECTIVE Temp:  [97.7 F (36.5 C)-98.6 F (37 C)] 98.6 F (37 C) (09/25 1100) Pulse Rate:  [57-94] 57 (09/25 1410) Cardiac Rhythm: Atrial fibrillation (09/25 0800) Resp:  [15-34] 17 (09/25 1410) BP: (84-189)/(56-93) 154/93 (09/25 1410) SpO2:  [95 %-100 %] 95 % (09/25 1410)   Recent Labs Lab 02/14/17 1833  GLUCAP 117*    Recent Labs Lab 02/14/17 1825 02/14/17 1840 02/16/17 0244  NA 139 141 136  K 4.2 4.4 3.5  CL 104 104 108  CO2 26  --  22  GLUCOSE 131* 126* 95  BUN 16 17 12   CREATININE 0.96 1.00 0.83  CALCIUM 9.3  --  8.8*    Recent Labs Lab 02/14/17 1825  AST 23  ALT 13*  ALKPHOS 59  BILITOT 0.9  PROT 7.3  ALBUMIN 4.0    Recent Labs Lab 02/14/17 1825 02/14/17 1840 02/16/17 0244  WBC 5.4  --  5.8  NEUTROABS 2.3  --   --   HGB 14.2 14.3 12.7*  HCT 41.2 42.0 38.1*  MCV 93.4  --  92.7  PLT 179  --  170    Recent Labs Lab 02/14/17 2340 02/15/17 1018  TROPONINI <0.03 0.20*    Recent Labs  02/14/17 1825  LABPROT 13.9  INR 1.08   No results for input(s): COLORURINE, LABSPEC, PHURINE, GLUCOSEU, HGBUR, BILIRUBINUR, KETONESUR, PROTEINUR, UROBILINOGEN, NITRITE, LEUKOCYTESUR in the last 72 hours.  Invalid input(s): APPERANCEUR     Component Value Date/Time   CHOL 150 02/15/2017 1018   TRIG 53 02/15/2017 1018   HDL 43 02/15/2017 1018   CHOLHDL 3.5 02/15/2017 1018   VLDL 11 02/15/2017 1018   LDLCALC 96 02/15/2017 1018   Lab Results  Component Value Date   HGBA1C 5.4 02/15/2017      Component Value Date/Time   LABOPIA NONE DETECTED 05/21/2016 2034   COCAINSCRNUR NONE DETECTED 05/21/2016 2034   LABBENZ NONE DETECTED 05/21/2016 2034   AMPHETMU NONE DETECTED 05/21/2016  2034   THCU NONE DETECTED 05/21/2016 2034   LABBARB NONE DETECTED 05/21/2016 2034    No results for input(s): ETH in the last 168 hours.  I have personally reviewed the radiological images below and agree with the radiology interpretations.  Ct Angio Head and neck W/cm &/or Wo Cm 02/14/2017 IMPRESSION: No significant carotid or vertebral artery stenosis in the neck Diffuse intracranial atherosclerotic disease including the middle cerebral arteries bilaterally. Moderate stenosis P2 segment bilaterally in the posterior cerebral arteries. No intracranial large vessel occlusion. No intracranial hemorrhage identified.  Ct Head Code Stroke Wo Contrast 02/14/2017 IMPRESSION: 1. New age indeterminate RIGHT basal ganglia lacunar infarct. 2. New nonacute RIGHT thalamus infarct. 3. Old small RIGHT cerebellar infarct. Stable moderate to severe chronic small vessel ischemic disease. 4. ASPECTS is 10.   Ct Head Wo Contrast 02/15/2017 IMPRESSION: 1. New, acute LEFT MCA territory nonhemorrhagic infarct. 2. Old lacunar infarcts and moderate to severe chronic small vessel ischemic disease.   Dg Chest Port 1 View 02/16/2017 IMPRESSION: 1. Negative for pulmonary edema. 2. Low volume chest with mild atelectasis.    Mr Brain Limited Wo Contrast 02/16/2017 IMPRESSION: Moderately large acute infarct in the left parietal lobe Small acute infarct right posterior parietal lobe.  TTE - The patient was in atrial fibrillation. Normal LV size with mild   LV hypertrophy. EF 55-60%. Mildly dilated RV with mildly   decreased systolic function. Biatrial enlargement. Mild pulmonary   hypertension. Mild aortic insufficiency. Moderate TR.   PHYSICAL EXAM  Temp:  [97.7 F (36.5 C)-98.6 F (37 C)] 98.6 F (37 C) (09/25 1100) Pulse Rate:  [57-94] 57 (09/25 1410) Resp:  [15-34] 17 (09/25 1410) BP: (84-189)/(56-93) 154/93 (09/25 1410) SpO2:  [95 %-100 %] 95 % (09/25 1410)  General - Well nourished, well developed,  not in acute distress  Ophthalmologic - not cooperation on exam  Cardiovascular - irregularly irregular heart rate and rhythm.  Neuro - awake alert, able to follow several central and peripheral commands such as eye close and opening, showing fingers and fist. Still has expressive aphasia, no language output. Able to mimic. Right neglect resolved. Eyes moving both directions. PERRL, facial symmetric, blinking to visual threat bilaterally. Tongue middle in mouth. Spontaneous moving all extremities, however, weaker 4-/5 RUE and 4+/5 LUE and 3/5 BLEs. DTR 1+ and negative babinski. Sensation, coordination and gait not tested.   ASSESSMENT/PLAN [name] is a 83 y.o. male with history of afib off eliquis for a week due to hematuria, HTN, prostate cancer and renal mass, previous stroke in 04/2016 admitted for aphasia, left gaze and right side weakness. S/p tPA. Some motor improvement but still left gaze and global aphasia.    Stroke:  left moderate sized MCA infarct and right small MCA/ACA infarct - embolic secondary to Afib off eliquis due to hematuria  MRI  Moderately large acute infarct in the left parietal lobe Small acute infarct right posterior parietal lobe.  CTA head and neck - diffuse intracranial stenosis especially b/l P2  2D Echo  EF 55-60%  LDL 96  HgbA1c 5.4  SCDs for VTE prophylaxis  Diet NPO time specified   No antithrombotic prior to admission, now on ASA for stroke prevention. Would resume eliquis in 3-5 days due to moderate sized infarct.  Ongoing aggressive stroke risk factor management  Therapy recommendations:  pending  Disposition:  pending  Afib off eliquis due to hematuria  Hematuria one week PTA  Off eliquis for one week  Foley catheter placed  Urine became clear last week  Plan to follow up with PCP and urology 02/15/17 for plan of foley catheter  Now on ASA  Would resume eliquis in 3-5 days due to moderate sized  infarct.  Hematuria  One week PTA, s/p foley and off eliquis  Hematuria resolve over time  However, recurred after tPA, now resolved again  Continue foley for now  Observation after resume eliquis in 3-5 days  Hypertension  Home meds:   Norvasc, metoprolol Permissive hypertension (OK if <180/105) for 24-48 hours post stroke and then gradually normalized within 5-7 days. Off cardene  Stable  Resumed home meds  Hyperlipidemia  Home meds:  none   LDL 96, goal < 70  Add statin once po access  Continue statin at discharge  Dysphagia  Passed swallow today  On diet  Resume home meds  Other Stroke Risk Factors  Advanced age  Hx stroke/TIA - 04/2016 - right IC/BG infarct - continued eliquis  Migraines  Other Active Problems  Prostate cancer s/p rad and chemo  Renal mass - continue observation  Other Pertinent History  Elevated troponin - 0.03->0.20 - likely demand ischemia due to stroke - continue to cycle troponin  Hospital day # 2  This patient is critically ill due to left MCA stroke, s/p tPA, afib not on AC, hematuria and at significant risk of neurological worsening, death form recurrent stroke, hemorrhagic conversion, heart failure, seizure, anemia, spesis. This patient's care requires constant monitoring of vital signs, hemodynamics, respiratory and cardiac monitoring, review of multiple databases, neurological assessment, discussion with family, other specialists and medical decision making of high complexity. I spent 35 minutes of neurocritical care time in the care of this patient.   Rosalin Hawking, MD PhD Stroke Neurology 02/16/2017 3:04 PM    To contact Stroke Continuity provider, please refer to [email or web address] After hours, contact General Neurology

## 2017-02-16 NOTE — Progress Notes (Signed)
PT Cancellation Note  Patient Details Name: Ethan Faulkner MRN: [id number] DOB: 12-13-1915   Cancelled Treatment:    Reason Eval/Treat Not Completed: Patient at procedure or test/unavailable.  Pt is out of room at a test.  PT to check back later as time allows.   Thanks,    Barbarann Ehlers. [place], [place], DPT [phone]   02/16/2017, 11:12 AM

## 2017-02-16 NOTE — ED Notes (Signed)
Pt daughter now states to [facility] that pt was taken off of Eliquis, not Plavix.

## 2017-02-16 NOTE — Care Management Note (Signed)
Case Management Note  Patient Details  Name: ARBOR COHEN MRN: [id number] Date of Birth: February 21, 1916  Subjective/Objective:  Pt admitted on 02/14/17 s/p Lt MCA stroke s/p TPA.  PTA, pt resides at home and has 24h caregivers, provided by family.                    Action/Plan: Will follow for home needs as pt progresses.  PT recommending possible SNF, though it is likely pt could return home with private care givers and home health therapies, if family desires.    Expected Discharge Date:                  Expected Discharge Plan:  [place]  In-House Referral:     Discharge planning Services  CM Consult  Post Acute Care Choice:    Choice offered to:     DME Arranged:    DME Agency:     HH Arranged:    [place] Agency:     Status of Service:  In process, will continue to follow  If discussed at Long Length of Stay Meetings, dates discussed:    Additional Comments:  Reinaldo Raddle, RN, BSN  Trauma/Neuro ICU Case Manager [phone]

## 2017-02-16 NOTE — Plan of Care (Signed)
Problem: Health Behavior/Discharge Planning: Goal: Ability to manage health-related needs will improve Outcome: Progressing Speech therapy met with pt today, he passed his swallow study and is on a Dys 1 diet with thin liquids.

## 2017-02-17 LAB — CBC
HCT: 35.6 % — ABNORMAL LOW (ref 39.0–52.0)
Hemoglobin: 12.1 g/dL — ABNORMAL LOW (ref 13.0–17.0)
MCH: 31.2 pg (ref 26.0–34.0)
MCHC: 34 g/dL (ref 30.0–36.0)
MCV: 91.8 fL (ref 78.0–100.0)
PLATELETS: 160 10*3/uL (ref 150–400)
RBC: 3.88 MIL/uL — ABNORMAL LOW (ref 4.22–5.81)
RDW: 13.5 % (ref 11.5–15.5)
WBC: 4.5 10*3/uL (ref 4.0–10.5)

## 2017-02-17 LAB — BASIC METABOLIC PANEL
ANION GAP: 11 (ref 5–15)
BUN: 12 mg/dL (ref 6–20)
CALCIUM: 8.6 mg/dL — AB (ref 8.9–10.3)
CO2: 23 mmol/L (ref 22–32)
Chloride: 103 mmol/L (ref 101–111)
Creatinine, Ser: 0.86 mg/dL (ref 0.61–1.24)
GFR calc non Af Amer: >60 mL/min (ref >60)
Glucose, Bld: 80 mg/dL (ref 65–99)
Potassium: 3.2 mmol/L — ABNORMAL LOW (ref 3.5–5.1)
SODIUM: 137 mmol/L (ref 135–145)

## 2017-02-17 LAB — TROPONIN I
TROPONIN I: 0.24 ng/mL — AB (ref <0.03)
Troponin I: 0.31 ng/mL (ref <0.03)

## 2017-02-17 MED ORDER — ATORVASTATIN CALCIUM 40 MG PO TABS
40.0000 mg | ORAL_TABLET | Freq: Every day | ORAL | Status: DC
  Administered 2017-02-17 – 2017-02-18 (×2): 40 mg via ORAL
  Filled 2017-02-17 (×2): qty 1

## 2017-02-17 NOTE — Progress Notes (Signed)
STROKE TEAM PROGRESS NOTE   SUBJECTIVE (INTERVAL HISTORY) His son is at the bedside. Pt still has expressive aphasia, but able to follow several cental and peripheral commands.  Tolerated a small serving of grits for breakfast.  Per Case Management discharge pending to SNF.  Elevated troponin, continuing to trend. Resume Eliquis tomorrow evening and watch for hematuria.   OBJECTIVE Temp:  [97.6 F (36.4 C)-98.3 F (36.8 C)] 98.1 F (36.7 C) (09/26 1000) Pulse Rate:  [57-96] 74 (09/26 1000) Cardiac Rhythm: Atrial fibrillation (09/26 0804) Resp:  [17-20] 18 (09/26 1000) BP: (84-166)/(56-98) 156/87 (09/26 1000) SpO2:  [95 %-99 %] 98 % (09/26 1000)   Recent Labs Lab 02/14/17 1833  GLUCAP 117*    Recent Labs Lab 02/14/17 1825 02/14/17 1840 02/16/17 0244 02/17/17 0532  NA 139 141 136 137  K 4.2 4.4 3.5 3.2*  CL 104 104 108 103  CO2 26  --  22 23  GLUCOSE 131* 126* 95 80  BUN 16 17 12 12   CREATININE 0.96 1.00 0.83 0.86  CALCIUM 9.3  --  8.8* 8.6*    Recent Labs Lab 02/14/17 1825  AST 23  ALT 13*  ALKPHOS 59  BILITOT 0.9  PROT 7.3  ALBUMIN 4.0    Recent Labs Lab 02/14/17 1825 02/14/17 1840 02/16/17 0244 02/17/17 0532  WBC 5.4  --  5.8 4.5  NEUTROABS 2.3  --   --   --   HGB 14.2 14.3 12.7* 12.1*  HCT 41.2 42.0 38.1* 35.6*  MCV 93.4  --  92.7 91.8  PLT 179  --  170 160    Recent Labs Lab 02/14/17 2340 02/15/17 1018 02/17/17 0532  TROPONINI <0.03 0.20* 0.31*    Recent Labs  02/14/17 1825  LABPROT 13.9  INR 1.08   No results for input(s): COLORURINE, LABSPEC, PHURINE, GLUCOSEU, HGBUR, BILIRUBINUR, KETONESUR, PROTEINUR, UROBILINOGEN, NITRITE, LEUKOCYTESUR in the last 72 hours.  Invalid input(s): APPERANCEUR     Component Value Date/Time   CHOL 150 02/15/2017 1018   TRIG 53 02/15/2017 1018   HDL 43 02/15/2017 1018   CHOLHDL 3.5 02/15/2017 1018   VLDL 11 02/15/2017 1018   LDLCALC 96 02/15/2017 1018   Lab Results  Component Value Date    HGBA1C 5.4 02/15/2017      Component Value Date/Time   LABOPIA NONE DETECTED 05/21/2016 2034   COCAINSCRNUR NONE DETECTED 05/21/2016 2034   LABBENZ NONE DETECTED 05/21/2016 2034   AMPHETMU NONE DETECTED 05/21/2016 2034   THCU NONE DETECTED 05/21/2016 2034   LABBARB NONE DETECTED 05/21/2016 2034    No results for input(s): ETH in the last 168 hours.  I have personally reviewed the radiological images below and agree with the radiology interpretations.  Ct Angio Head and neck W/cm &/or Wo Cm 02/14/2017 IMPRESSION: No significant carotid or vertebral artery stenosis in the neck Diffuse intracranial atherosclerotic disease including the middle cerebral arteries bilaterally. Moderate stenosis P2 segment bilaterally in the posterior cerebral arteries. No intracranial large vessel occlusion. No intracranial hemorrhage identified.  Ct Head Code Stroke Wo Contrast 02/14/2017 IMPRESSION: 1. New age indeterminate RIGHT basal ganglia lacunar infarct. 2. New nonacute RIGHT thalamus infarct. 3. Old small RIGHT cerebellar infarct. Stable moderate to severe chronic small vessel ischemic disease. 4. ASPECTS is 10.   Ct Head Wo Contrast 02/15/2017 IMPRESSION: 1. New, acute LEFT MCA territory nonhemorrhagic infarct. 2. Old lacunar infarcts and moderate to severe chronic small vessel ischemic disease.   Dg Chest Port 1 View 02/16/2017 IMPRESSION: 1.  Negative for pulmonary edema. 2. Low volume chest with mild atelectasis.    Mr Brain Limited Wo Contrast 02/16/2017 IMPRESSION: Moderately large acute infarct in the left parietal lobe Small acute infarct right posterior parietal lobe.   TTE  - The patient was in atrial fibrillation. Normal LV size with mild LV hypertrophy. EF 55-60%. Mildly dilated RV with mildly decreased systolic function. Biatrial enlargement. Mild pulmonary hypertension. Mild aortic insufficiency. Moderate TR.   PHYSICAL EXAM  Temp:  [97.6 F (36.4 C)-98.3 F (36.8 C)] 98.1 F  (36.7 C) (09/26 1000) Pulse Rate:  [57-96] 74 (09/26 1000) Resp:  [17-20] 18 (09/26 1000) BP: (84-166)/(56-98) 156/87 (09/26 1000) SpO2:  [95 %-99 %] 98 % (09/26 1000)  General - Well nourished, well developed, not in acute distress  Ophthalmologic - not cooperation on exam  Cardiovascular - irregularly irregular heart rate and rhythm.  Neuro - awake alert, able to follow several central and peripheral commands such as eye close and opening, showing fingers and fist. Still has expressive aphasia, no language output. Able to mimic. Right neglect resolved. Eyes moving both directions. PERRL, facial symmetric, blinking to visual threat bilaterally. Tongue middle in mouth. Spontaneous moving all extremities, however, weaker 4-/5 RUE and 4+/5 LUE and 3/5 BLEs. DTR 1+ and negative babinski. Sensation, coordination and gait not tested.   ASSESSMENT/PLAN [name] is a 83 y.o. male with history of afib off eliquis for a week due to hematuria, HTN, prostate cancer and renal mass, previous stroke in 04/2016 admitted for aphasia, left gaze and right side weakness. S/p tPA. Some motor improvement but still left gaze and global aphasia.    Stroke:  left moderate sized MCA infarct and right small MCA/ACA infarct - embolic secondary to Afib off eliquis due to hematuria  MRI  Moderately large acute infarct in the left parietal lobe Small acute infarct right posterior parietal lobe.  CTA head and neck - diffuse intracranial stenosis especially b/l P2  2D Echo  EF 55-60%  LDL 96  HgbA1c 5.4  SCDs for VTE prophylaxis  DIET - DYS 1 Room service appropriate? Yes; Fluid consistency: Thin   No antithrombotic prior to admission, now on ASA for stroke prevention. Would resume eliquis tomorrow (day 4) due to moderate sized infarct.  Ongoing aggressive stroke risk factor management  Therapy recommendations:  pending  Disposition:  pending  Afib off eliquis due to hematuria  Hematuria one  week PTA  Off eliquis for one week  Foley catheter placed  Urine became clear last week  Plan to follow up with PCP and urology 02/15/17 for plan of foley catheter  Now on ASA  Would resume eliquis tomorrow (day 4) and watch for hematuria.  Elevated troponin  0.03->0.20->0.31  likely demand ischemia due to stroke  continue to trend troponin  Hematuria  One week PTA, s/p foley and off eliquis  Hematuria resolve over time  However, recurred after tPA, now resolved again  Continue foley for now  Observation after resume eliquis tomorrow  Hypertension  Home meds:   Norvasc, metoprolol Permissive hypertension (OK if <180/105) for 24-48 hours post stroke and then gradually normalized within 5-7 days. Off cardene  Stable  Resumed home meds  Hyperlipidemia  Home meds:  none   LDL 96, goal < 70  Add atorvastatin 40 mg PO daily  Continue statin at discharge  Dysphagia  Passed swallow 02/16/2017  On diet  Resume home meds  Other Stroke Risk Factors  Advanced age  Hx stroke/TIA - 04/2016 - right IC/BG infarct - continued eliquis  Migraines  Other Active Problems  Prostate cancer s/p rad and chemo  Renal mass - continue observation  Other Pertinent History    Hospital day # 3   Rosalin Hawking, MD PhD Stroke Neurology 02/17/2017 12:46 PM    To contact Stroke Continuity provider, please refer to [email or web address] After hours, contact General Neurology

## 2017-02-17 NOTE — NC FL2 (Signed)
[place] LEVEL OF CARE SCREENING TOOL     IDENTIFICATION  Patient Name: Ethan Faulkner Birthdate: August 24, 1915 Sex: male Admission Date (Current Location): 02/14/2017  [facility] and [place] Number:  [employer] and Address:  [facility], [place] [address] [place], [place] [address]      Provider Number: [id number]  Attending Physician Name and Address:  Rosalin Hawking, MD  Relative Name and Phone Number:       Current Level of Care: Hospital Recommended Level of Care: [place] Prior Approval Number:    Date Approved/Denied:   PASRR Number: [id number]  Discharge Plan: SNF    Current Diagnoses: Patient Active Problem List   Diagnosis Date Noted  . CVA (cerebral vascular accident) ([place]) 02/14/2017  . 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 Acute CVA (cerebrovascular accident) ([place]) 05/22/2016  . TIA (transient ischemic attack) 05/21/2016  . Left leg weakness   . Leukopenia   . Thrombocytopenia ([place])   . Fall   . Acute blood loss anemia   . Femoral neck fracture ([place]) 11/20/2015  . Renal malignant tumor ([place]) 06/06/2015  . Long-term (current) use of anticoagulants 04/29/2015  . Subclinical hypothyroidism 03/01/2014  . Atrial fibrillation ([place]) 06/09/2013  . STRESS FRACTURE, FOOT 12/12/2008  . CLOSED FRACTURE OF METATARSAL BONE 12/12/2008    Orientation RESPIRATION BLADDER Height & Weight     Self, Time, Situation, Place  Normal External catheter (placed 02/14/17) Weight: 161 lb 13.1 oz (73.4 kg) Height:  5\' 10"  (177.8 cm)  BEHAVIORAL SYMPTOMS/MOOD NEUROLOGICAL BOWEL NUTRITION STATUS      Continent Diet (puree, thin liquids)  AMBULATORY STATUS COMMUNICATION OF NEEDS Skin   Extensive Assist Verbally Normal                       Personal Care Assistance Level of Assistance  Bathing, Feeding, Dressing Bathing Assistance: Maximum assistance Feeding assistance: Limited  assistance Dressing Assistance: Maximum assistance     Functional Limitations Info             SPECIAL CARE FACTORS FREQUENCY  PT (By licensed PT), OT (By licensed OT)     PT Frequency: 5x/wk OT Frequency: 5x/wk            Contractures      Additional Factors Info  Code Status, Allergies Code Status Info: DNR Allergies Info: Xanax Alprazolam, Tape           Current Medications (02/17/2017):  This is the current hospital active medication list Current Facility-Administered Medications  Medication Dose Route Frequency Provider Last Rate Last Dose  . 0.9 %  sodium chloride infusion   Intravenous Continuous Rosalin Hawking, MD 40 mL/hr at 02/16/17 1956    . acetaminophen (TYLENOL) tablet 650 mg  650 mg Oral Q4H PRN Wallie Char       Or  . acetaminophen (TYLENOL) solution 650 mg  650 mg Per Tube Q4H PRN Wallie Char       Or  . acetaminophen (TYLENOL) suppository 650 mg  650 mg Rectal Q4H PRN Wallie Char      . amLODipine (NORVASC) tablet 2.5 mg  2.5 mg Oral Daily Rosalin Hawking, MD   2.5 mg at 02/17/17 1022  . aspirin EC tablet 325 mg  325 mg Oral Daily Rosalin Hawking, MD      . atorvastatin (LIPITOR) tablet 40 mg  40 mg Oral q1800 Patteson, Arlan Organ, NP      .  chlorhexidine (PERIDEX) 0.12 % solution 15 mL  15 mL Mouth Rinse BID Rosalin Hawking, MD   15 mL at 02/17/17 1021  . enoxaparin (LOVENOX) injection 40 mg  40 mg Subcutaneous Q24H Patteson, Samuel A, NP   40 mg at 02/17/17 1021  . Influenza vac split quadrivalent PF (FLUZONE HIGH-DOSE) injection 0.5 mL  0.5 mL Intramuscular Prior to discharge Wallie Char      . levothyroxine (SYNTHROID, LEVOTHROID) tablet 25 mcg  25 mcg Oral QAC breakfast Rosalin Hawking, MD   25 mcg at 02/17/17 1021  . MEDLINE mouth rinse  15 mL Mouth Rinse q12n4p Rosalin Hawking, MD   15 mL at 02/16/17 1551  . metoprolol succinate (TOPROL-XL) 24 hr tablet 25 mg  25 mg Oral QPM Rosalin Hawking, MD   25 mg at 02/16/17 1835  . pantoprazole (PROTONIX) EC  tablet 40 mg  40 mg Oral Daily Rosalin Hawking, MD   40 mg at 02/17/17 1021     Discharge Medications: Please see discharge summary for a list of discharge medications.  Relevant Imaging Results:  Relevant Lab Results:   Additional Information SS#: [id number]  Geralynn Ochs, LCSW

## 2017-02-17 NOTE — Progress Notes (Signed)
Nutrition Consult/Brief Note  RD consulted for Enteral/tube feeding initiation & management 9/24 2133. Chart reviewed. S/p bedside swallow evaluation 9/25. Diet advanced to Dysphagia 1-thin liquids per SLP. No plan for tube feeding initiation at this time. Confirmed with RN and Dr. Erlinda Hong. Please re-consult as needed.  Arthur Holms, RD, LDN Pager #: [phone] After-Hours Pager #: [phone]

## 2017-02-17 NOTE — Clinical Social Work Note (Signed)
Clinical Social Work Assessment  Patient Details  Name: Ethan Faulkner MRN: [id number] Date of Birth: 25-Sep-1915  Date of referral:  02/17/17               Reason for consult:  Facility Placement, Discharge Planning                Permission sought to share information with:  Facility Sport and exercise psychologist, Family Supports Permission granted to share information::  Yes, Verbal Permission Granted  Name::     Ethan Faulkner  Agency::  SNF  Relationship::  Children  Contact Information:     Housing/Transportation Living arrangements for the past 2 months:  Single Family Home Source of Information:  Adult Children Patient Interpreter Needed:  None Criminal Activity/Legal Involvement Pertinent to Current Situation/Hospitalization:  No - Comment as needed Significant Relationships:  Adult Children Lives with:  Self Do you feel safe going back to the place where you live?  Yes Need for family participation in patient care:  No (Coment)  Care giving concerns:  Patient has been living home alone with caregiving support, but currently requires assistance at rehab to improve ability to return home safely.   Social Worker assessment / plan:  CSW met with patient and patient's daughter and son Ethan Faulkner and Ethan Faulkner) at bedside to discuss discharge planning. CSW discussed patient's children's preference for [facility] and discussed options for if [facility] doesn't have any beds available at discharge. Patient's son and patient's daughter asked about transportation and home options, if needed. CSW provided information requested. CSW to follow up with [facility] admissions to determine bed availability and inform family of plan.  Employment status:  Retired Forensic scientist:  Medicare PT Recommendations:  Not assessed at this time Information / Referral to community resources:     Patient/Family's Response to care:  Patient's children agreeable to SNF Placement. Patient's response  unable to be assessed as he would not wake up.  Patient/Family's Understanding of and Emotional Response to Diagnosis, Current Treatment, and Prognosis:  Patient's children discussed anxiety about the SNF referral process and having had difficulty in the past making sure that everything was arranged on time; family doesn't want to have to deal with arranging a discharge plan last minute. Patient's children were appreciative of CSW assistance.  Emotional Assessment Appearance:  Appears stated age Attitude/Demeanor/Rapport:  Unable to Assess Affect (typically observed):  Unable to Assess Orientation:  Oriented to Self, Oriented to Place, Oriented to  Time, Oriented to Situation Alcohol / Substance use:  Not Applicable Psych involvement (Current and /or in the community):  No (Comment)  Discharge Needs  Concerns to be addressed:    Readmission within the last 30 days:  No Current discharge risk:  Lives alone, Physical Impairment Barriers to Discharge:  Continued Medical Work up   [employer], [place] 02/17/2017, 2:07 PM

## 2017-02-17 NOTE — Progress Notes (Signed)
  Speech Language Pathology Treatment: Dysphagia;Cognitive-Linquistic  Patient Details Name: Ethan Faulkner MRN: [id number] DOB: Oct 20, 1915 Today's Date: 02/17/2017 Time: 5329-9242 SLP Time Calculation (min) (ACUTE ONLY): 27 min  Assessment / Plan / Recommendation Clinical Impression  Pt was seen following cognitive-linguistic evaluation for treatment of dysphagia and communication.  Pt consumed dys 1 textures and thin liquids with max assist verbal and visual cues to correct anterior labial loss of boluses.  Oral phase appeared improved in terms of timeliness in comparison to initial evaluation as evidenced by no cues needed for oral holding; however, pt remains very impaired orally due to weakness and sequencing of self feeding.  No overt s/s of aspiration were evident with purees or liquids although intake was minimal.  Pt needed max assist multimodal cues to follow commands and answer yes/no questions in a functional context.  He also needed intermittent hand over hand assist to functionally use his right upper extremity to feed himself due to likely inattention and weakness.  Discussed strategies to facilitate pt's functional communication with pt's son, Ethan Faulkner who was present for the duration of today's evaluation.  He verbalized understanding and was in agreement with recommended plan of care.     HPI HPI: Pt is an 83 y.o.malewith a history of atrial fibrillation and recently discontinued anticoagulation, hypertension, dysphagia, hyperlipidemia and old stroke, brought to the ED at [facility] with acute onset of inability to speak and staring to his left side along with weakness involving right arm and right leg. He was noted to have findings indicative of acute left MCA territory ischemic infarction. CT scan of his head showed no acute intracranial abnormality; new age indeterminate right basal ganglia lacunar infarct, new nonacute right thalamus infarct, old small right cerebellar  infarct and stable moderate to severe chronic small vessel ischemic disease. He was deemed a candidate for IV TPA which was administered. Modified barium swallow study performed on 06/08/16; results show mild/mod oropharyngeal dysphagia with recommended diet of dys 3 (mech soft) with thin liquids.       SLP Plan  Continue with current plan of care  Patient needs continued Speech Lanaguage Pathology Services    Recommendations  Diet recommendations: Dysphagia 1 (puree);Thin liquid Liquids provided via: Cup;Straw Medication Administration: Crushed with puree Supervision: Patient able to self feed;Full supervision/cueing for compensatory strategies Compensations: Minimize environmental distractions;Slow rate;Small sips/bites;Follow solids with liquid Postural Changes and/or Swallow Maneuvers: Seated upright 90 degrees;Out of bed for meals                Oral Care Recommendations: Oral care before and after PO Follow up Recommendations: Skilled Nursing facility;Home health SLP;24 hour supervision/assistance SLP Visit Diagnosis: Dysphagia, oral phase (R13.11);Aphasia (R47.01);Dysarthria and anarthria (R47.1);Cognitive communication deficit (R41.841) Plan: Continue with current plan of care       GO                Kenadi Miltner, Selinda Orion 02/17/2017, 9:35 AM

## 2017-02-17 NOTE — Plan of Care (Signed)
Problem: Education: Goal: Knowledge of [facility] Education information/materials will improve Outcome: Progressing POC reviewed with pt./family - pt. with aphasia.

## 2017-02-17 NOTE — Evaluation (Addendum)
Speech Language Pathology Evaluation Patient Details Name: Ethan Faulkner MRN: [id number] DOB: 03-17-1916 Today's Date: 02/17/2017 Time: 5053-9767 SLP Time Calculation (min) (ACUTE ONLY): 10 min  Problem List:  Patient Active Problem List   Diagnosis Date Noted  . CVA (cerebral vascular accident) ([place]) 02/14/2017  . 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 Acute CVA (cerebrovascular accident) ([place]) 05/22/2016  . TIA (transient ischemic attack) 05/21/2016  . Left leg weakness   . Leukopenia   . Thrombocytopenia ([place])   . Fall   . Acute blood loss anemia   . Femoral neck fracture ([place]) 11/20/2015  . Renal malignant tumor ([place]) 06/06/2015  . Long-term (current) use of anticoagulants 04/29/2015  . Subclinical hypothyroidism 03/01/2014  . Atrial fibrillation ([place]) 06/09/2013  . STRESS FRACTURE, FOOT 12/12/2008  . CLOSED FRACTURE OF METATARSAL BONE 12/12/2008   Past Medical History:  Past Medical History:  Diagnosis Date  . Afib ([place])   . Atrial fibrillation ([place])   . Cerebral infarction ([place])   . DDD (degenerative disc disease), lumbar   . Dysphagia   . Hypertension   . Hypothyroidism   . Migraine    "maybe monthly" (11/21/2015)  . Prostate cancer ([place]) ~ 1995   S/P radiation tx  . Renal mass    "slow growing something; doctors just watching it right now" (11/21/2015)  . Stroke [facility])    TIA  . Thrombocytopenia ([place])   . Thyroid disease    hypothyroid   Past Surgical History:  Past Surgical History:  Procedure Laterality Date  . COLONOSCOPY  1999  . ESOPHAGOGASTRODUODENOSCOPY    . LAPAROSCOPIC CHOLECYSTECTOMY  1980s  . PROSTATE BIOPSY  ~ 1995  . TOTAL HIP ARTHROPLASTY Left 11/22/2015   Procedure: LEFT HIP HEMIARTHROPLASTY ;  Surgeon: Leandrew Koyanagi, MD;  Location: [place];  Service: Orthopedics;  Laterality: Left;   HPI:  Pt is an 83 y.o.malewith a history of atrial fibrillation and recently discontinued anticoagulation, hypertension,  dysphagia, hyperlipidemia and old stroke, brought to the ED at [facility] with acute onset of inability to speak and staring to his left side along with weakness involving right arm and right leg. He was noted to have findings indicative of acute left MCA territory ischemic infarction. CT scan of his head showed no acute intracranial abnormality; new age indeterminate right basal ganglia lacunar infarct, new nonacute right thalamus infarct, old small right cerebellar infarct and stable moderate to severe chronic small vessel ischemic disease. He was deemed a candidate for IV TPA which was administered. Modified barium swallow study performed on 06/08/16; results show mild/mod oropharyngeal dysphagia with recommended diet of dys 3 (mech soft) with thin liquids.    Assessment / Plan / Recommendation Clinical Impression   Pt presents with a moderately severe global aphasia with receptive language being more intact than expressive.  Pt can follow 1-step commands and answer immediate, personally relevant yes/no questions with cues and increased processing time.  Pt demonstrated mild-moderately decreased strength and range of motion on oral motor exam.  He is largely nonverbal and has minimal articulation of consonants but can initiate some vocalization of basic vowel sounds with cues.  Pt demonstrates probable right inattention during functional tasks and frequently dropped things from his right hand with poor awareness of doing so.  Pt also made little eye contact with his son who was seated on his right side.  Pt also demonstrated decreased sustained attention to functional tasks and needed frequent cues for redirection.  As a result, pt would benefit from skilled ST while inpatient for cognitive-linguistic function as well as swallowing.      SLP Assessment  SLP Recommendation/Assessment: Patient needs continued Speech Lanaguage Pathology Services SLP Visit Diagnosis: Dysphagia, oral phase  (R13.11);Aphasia (R47.01);Dysarthria and anarthria (R47.1);Cognitive communication deficit (R41.841)    Follow Up Recommendations  Skilled Nursing facility;Home health SLP;24 hour supervision/assistance    Frequency and Duration min 3x week         SLP Evaluation Cognition  Overall Cognitive Status: Impaired/Different from baseline Arousal/Alertness: Awake/alert Orientation Level: Oriented to person;Disoriented to situation Attention: Sustained Sustained Attention: Impaired Memory:  (UTA due to global aphasia) Awareness: Impaired Awareness Impairment: Intellectual impairment Problem Solving: Impaired Problem Solving Impairment: Functional basic Executive Function:  (all impaired due to underlying lower level deficits ) Safety/Judgment: Impaired       Comprehension  Auditory Comprehension Overall Auditory Comprehension: Impaired Yes/No Questions: Impaired Basic Biographical Questions: 26-50% accurate Basic Immediate Environment Questions: 25-49% accurate Commands: Impaired One Step Basic Commands: 25-49% accurate    Expression Expression Primary Mode of Expression: Nonverbal - gestures Verbal Expression Overall Verbal Expression: Impaired Automatic Speech: Singing Repetition: Impaired Level of Impairment: Word level Naming: Impairment Confrontation: Impaired Other Verbal Expression Comments:  (perseverative vocalization of vowels, minimal articulation)   Oral / Motor  Oral Motor/Sensory Function Overall Oral Motor/Sensory Function: Mild impairment Facial ROM: Reduced right Facial Symmetry: Within Functional Limits Facial Strength: Within Functional Limits Lingual ROM: Suspected CN XII (hypoglossal) dysfunction Lingual Symmetry: Within Functional Limits Lingual Strength: Reduced Motor Speech Overall Motor Speech: Impaired (minimal articulation and vocalization)   GO                    Ethan Faulkner, Ethan Faulkner 02/17/2017, 9:23 AM

## 2017-02-17 NOTE — Progress Notes (Signed)
PT CONSUMED CRUSHED MEDS IN APPLESAUCE WITHOUT OBVIOUS COMPLICATION, PT CLEARED MOUTH WITH SWALLOWING, FOLLWED WITH SIPS OF WATER.

## 2017-02-17 NOTE — Evaluation (Signed)
Physical Therapy Evaluation Patient Details Name: Ethan Faulkner MRN: [id number] DOB: February 12, 1916 Today's Date: 02/17/2017   History of Present Illness  83 y.o. male with a history of atrial fibrillation and recently discontinued anticoagulation, hypertension, hyperlipidemia and old stroke, brought to the ED at [facility] with acute onset of inability to speak and staring to his left side along with weakness involving right arm and right leg.   Clinical Impression  Orders received for PT evaluation. Patient demonstrates deficits in functional mobility as indicated below. Will benefit from continued skilled PT to address deficits and maximize function. Will see as indicated and progress as tolerated.  Will need SNF.    Follow Up Recommendations SNF;Supervision/Assistance - 24 hour    Equipment Recommendations  None recommended by PT    Recommendations for Other Services       Precautions / Restrictions Precautions Precautions: Fall Restrictions Weight Bearing Restrictions: No      Mobility  Bed Mobility Overal bed mobility: Needs Assistance Bed Mobility: Supine to Sit     Supine to sit: Mod assist;+2 for physical assistance        Transfers Overall transfer level: Needs assistance Equipment used: Rolling walker (2 wheeled) Transfers: Sit to/from [facility] Sit to Stand: Mod assist;+2 physical assistance;+2 safety/equipment Stand pivot transfers: Mod assist;+2 physical assistance;+2 safety/equipment       General transfer comment: Multi modal cues for initiation, patient able to power up with moderate assist (+2 for safety).  Ambulation/Gait                Stairs            Wheelchair Mobility    Modified Rankin (Stroke Patients Only) Modified Rankin (Stroke Patients Only) Pre-Morbid Rankin Score: Moderate disability Modified Rankin: Severe disability     Balance Overall balance assessment: Needs  assistance Sitting-balance support: Feet supported;No upper extremity supported Sitting balance-Leahy Scale: Poor     Standing balance support: Bilateral upper extremity supported;During functional activity Standing balance-Leahy Scale: Poor Standing balance comment: Required physical support to maintain standing balance                             Pertinent Vitals/Pain Faces Pain Scale: No hurt    Home Living Family/patient expects to be discharged to:: Skilled nursing facility Living Arrangements: Other (Comment) (24 hour at home care)                    Prior Function Level of Independence: Needs assistance   Gait / Transfers Assistance Needed: Pt used a RW   ADL's / Homemaking Assistance Needed: Daughter reports min assist provided for ADL tasks  Comments: Pt lived alone and has 24 hour assistance.      Hand Dominance   Dominant Hand: Right    Extremity/Trunk Assessment   Upper Extremity Assessment Upper Extremity Assessment: Generalized weakness    Lower Extremity Assessment Lower Extremity Assessment: Generalized weakness;Difficult to assess due to impaired cognition       Communication   Communication: Expressive difficulties  Cognition Arousal/Alertness: Lethargic Behavior During Therapy: Restless Overall Cognitive Status: Impaired/Different from baseline Area of Impairment: Following commands                       Following Commands: Follows one step commands inconsistently       General Comments: Diffult to assess due to limited expressive communication skills. Not formally  assessed. Did follow simple commands >75% of the time      General Comments      Exercises     Assessment/Plan    PT Assessment Patient needs continued PT services  PT Problem List Decreased strength;Decreased activity tolerance;Decreased balance;Decreased mobility;Decreased knowledge of use of DME       PT Treatment Interventions DME  instruction;Gait training;Functional mobility training;Therapeutic activities;Therapeutic exercise;Balance training;Neuromuscular re-education;Patient/family education    PT Goals (Current goals can be found in the Care Plan section)  Acute Rehab PT Goals Patient Stated Goal: to be able to walk again per family PT Goal Formulation: With family Time For Goal Achievement: 03/03/17 Potential to Achieve Goals: Good    Frequency Min 3X/week   Barriers to discharge        Co-evaluation               AM-PAC PT "6 Clicks" Daily Activity  Outcome Measure Difficulty turning over in bed (including adjusting bedclothes, sheets and blankets)?: Unable Difficulty moving from lying on back to sitting on the side of the bed? : Unable Difficulty sitting down on and standing up from a chair with arms (e.g., wheelchair, bedside commode, etc,.)?: Unable Help needed moving to and from a bed to chair (including a wheelchair)?: A Lot Help needed walking in hospital room?: A Lot Help needed climbing 3-5 steps with a railing? : Total 6 Click Score: 8    End of Session Equipment Utilized During Treatment: Gait belt Activity Tolerance: Patient tolerated treatment well Patient left: in chair;with call bell/phone within reach;with chair alarm set;with family/visitor present Nurse Communication: Mobility status PT Visit Diagnosis: Muscle weakness (generalized) (M62.81);Other symptoms and signs involving the nervous system (R29.898)    Time: 1438-1500 PT Time Calculation (min) (ACUTE ONLY): 22 min   Charges:   PT Evaluation $PT Eval Moderate Complexity: 1 Mod     PT G Codes:        Alben Deeds, PT DPT  Board Certified Neurologic Specialist [place] 02/17/2017, 4:52 PM

## 2017-02-18 LAB — CBC
HEMATOCRIT: 37.1 % — AB (ref 39.0–52.0)
Hemoglobin: 12.6 g/dL — ABNORMAL LOW (ref 13.0–17.0)
MCH: 30.9 pg (ref 26.0–34.0)
MCHC: 34 g/dL (ref 30.0–36.0)
MCV: 90.9 fL (ref 78.0–100.0)
Platelets: 168 10*3/uL (ref 150–400)
RBC: 4.08 MIL/uL — ABNORMAL LOW (ref 4.22–5.81)
RDW: 13.1 % (ref 11.5–15.5)
WBC: 5.3 10*3/uL (ref 4.0–10.5)

## 2017-02-18 LAB — BASIC METABOLIC PANEL
Anion gap: 9 (ref 5–15)
BUN: 18 mg/dL (ref 6–20)
CALCIUM: 8.5 mg/dL — AB (ref 8.9–10.3)
CO2: 23 mmol/L (ref 22–32)
CREATININE: 0.95 mg/dL (ref 0.61–1.24)
Chloride: 102 mmol/L (ref 101–111)
GFR calc Af Amer: >60 mL/min (ref >60)
GFR calc non Af Amer: >60 mL/min (ref >60)
GLUCOSE: 99 mg/dL (ref 65–99)
Potassium: 3.1 mmol/L — ABNORMAL LOW (ref 3.5–5.1)
Sodium: 134 mmol/L — ABNORMAL LOW (ref 135–145)

## 2017-02-18 LAB — TROPONIN I: Troponin I: 0.21 ng/mL (ref <0.03)

## 2017-02-18 MED ORDER — ATORVASTATIN CALCIUM 10 MG PO TABS
20.0000 mg | ORAL_TABLET | Freq: Every day | ORAL | Status: DC
  Administered 2017-02-19: 20 mg via ORAL
  Filled 2017-02-18: qty 2

## 2017-02-18 MED ORDER — AMLODIPINE BESYLATE 5 MG PO TABS
5.0000 mg | ORAL_TABLET | Freq: Every day | ORAL | Status: DC
  Administered 2017-02-19: 5 mg via ORAL
  Filled 2017-02-18: qty 1

## 2017-02-18 MED ORDER — APIXABAN 5 MG PO TABS: 5.0000 mg | ORAL_TABLET | Freq: Two times a day (BID) | ORAL | Status: DC

## 2017-02-18 MED ORDER — APIXABAN 5 MG PO TABS
5.0000 mg | ORAL_TABLET | Freq: Two times a day (BID) | ORAL | Status: DC
  Administered 2017-02-18 – 2017-02-19 (×2): 5 mg via ORAL
  Filled 2017-02-18 (×2): qty 1

## 2017-02-18 MED ORDER — POTASSIUM CHLORIDE CRYS ER 20 MEQ PO TBCR
40.0000 meq | EXTENDED_RELEASE_TABLET | ORAL | Status: AC
  Administered 2017-02-18 – 2017-02-19 (×2): 40 meq via ORAL
  Filled 2017-02-18 (×2): qty 2

## 2017-02-18 NOTE — Consult Note (Signed)
[facility] CM Primary Care Navigator  02/18/2017  Ethan Faulkner 12-11-1915 [id number]   Met with patient and son [name]) in the room to identify possible discharge needs.  Patient was assisted with feeding by NT. He remains non-verbal. Son reports that patient had sudden onset of being nonverbal while eating at home; facial drooping and weakness (not using his right arm or leg) which had led to this admission.  Patient's son endorses [name] with [facility] as theprimary care provider.   Son shared using [facility] in [place] obtain medications without difficulty.   Patient's daughter [name]) has been managinghismedications at home using "pill box" system filled weekly.  Per son, patient's children provide transportation to his doctors'appointments.  Son reports that patient has a private pay caretaker who provides care for him in the day and children take turns to stay with patient at nights.   Anticipated discharge plan is skilled nursing facility (SNF- in process) for rehabilitation per therapy recommendation. Son states preference is [facility].  Patient's sonvoiced understanding to call primary care provider's officewhen hereturns backhome,for a post discharge follow-up within a week or sooner if needs arise.Patient letter (with PCP's contact number) was provided as theirreminder.   Discussed with son regarding [facility] CM services available for health management at home but he communicated no current needs or concerns for now. Patient's son expressed understanding to seekreferral from primary care provider to [facility] care management ifdeemed necessary and appropriatefor services in the [place] he returns back home.  [facility] care management information was provided for future needs that may arise.  Primary care provider's office listed as doing transition of care (TOC).   For questions, please  contact:  Dannielle Huh, BSN, RN- [facility] Primary Care Navigator  Telephone: [phone] [place]

## 2017-02-18 NOTE — Progress Notes (Signed)
Occupational Therapy Treatment Patient Details Name: Ethan Faulkner MRN: [id number] DOB: 07-Jul-1915 Today's Date: 02/18/2017    History of present illness 83 y.o. male with a history of atrial fibrillation and recently discontinued anticoagulation, hypertension, hyperlipidemia and old stroke, brought to the ED at [facility] with acute onset of inability to speak and staring to his left side along with weakness involving right arm and right leg.    OT comments  Focus of session on self feeding. Pt using tubing to assit with handling utensils. Pt with apparent sensorimotor deficits and requires hand over hand assist at times, in addition for verbal and tactile cues for correct bite amount and to swallow. Son present for session. Educated son on pt's deficits and how this affects him functionally. Continue to recommend SNF for rehab. Will continue to follow.   Follow Up Recommendations  SNF;Supervision/Assistance - 24 hour    Equipment Recommendations  None recommended by OT    Recommendations for Other Services      Precautions / Restrictions Precautions Precautions: Fall       Mobility   Balance    ADL either performed or assessed with clinical judgement   ADL Overall ADL's : Needs assistance/impaired Eating/Feeding: Moderate assistance Eating/Feeding Details (indicate cue type and reason): hand over hand at times to initiate due to apparent motor planning deficits. apaprent alien arm movments during self feeding. Pt requires cues to swallow and physical assist to assure proper size in bolus. given red tubing and appears to help with self feeding. son present and educated on his Dad's difficulty with feeding due to motor planning deficits. Son would benefit from further education on how his Dad's deficits affect him functionally.                                           Vision       Perception     Praxis  apraxic    Cognition  Arousal/Alertness: Awake/alert Behavior During Therapy: Flat affect Overall Cognitive Status: Difficult to assess Area of Impairment: Attention;Following commands;Safety/judgement;Awareness;Problem solving                   Current Attention Level: Sustained   Following Commands: Follows one step commands inconsistently Safety/Judgement: Decreased awareness of safety;Decreased awareness of deficits Awareness: Intellectual Problem Solving: Slow processing;Decreased initiation;Difficulty sequencing;Requires verbal cues;Requires tactile cues General Comments: motor planning deficits        Exercises     Shoulder Instructions       General Comments      Pertinent Vitals/ Pain       Pain Assessment: No/denies pain Faces Pain Scale: No hurt  Home Living                                          Prior Functioning/Environment              Frequency  Min 2X/week        Progress Toward Goals  OT Goals(current goals can now be found in the care plan section)  Progress towards OT goals: Progressing toward goals  Acute Rehab OT Goals Patient Stated Goal: walk per son in room during session OT Goal Formulation: With patient/family Time For Goal Achievement: 03/02/17 Potential to Achieve Goals: Good ADL  Goals Pt Will Perform Grooming: with min assist;sitting Pt Will Perform Upper Body Bathing: with min assist;sitting Pt Will Transfer to Toilet: with mod assist;ambulating;grab bars;regular height toilet  Plan Discharge plan remains appropriate    Co-evaluation                 AM-PAC PT "6 Clicks" Daily Activity     Outcome Measure   Help from another person eating meals?: A Lot Help from another person taking care of personal grooming?: A Lot Help from another person toileting, which includes using toliet, bedpan, or urinal?: A Lot Help from another person bathing (including washing, rinsing, drying)?: A Lot Help from another person  to put on and taking off regular upper body clothing?: A Lot Help from another person to put on and taking off regular lower body clothing?: A Lot 6 Click Score: 12    End of Session    OT Visit Diagnosis: Unsteadiness on feet (R26.81);Muscle weakness (generalized) (M62.81);Other symptoms and signs involving cognitive function;Cognitive communication deficit (R41.841)   Activity Tolerance Patient tolerated treatment well   Patient Left in chair;with call bell/phone within reach;with chair alarm set;with nursing/sitter in room;with family/visitor present   Nurse Communication Mobility status;Other (comment) (assist with feeding)        Time: 8185-6314 OT Time Calculation (min): 14 min  Charges: OT General Charges $OT Visit: 1 Visit OT Treatments $Self Care/Home Management : 8-22 mins  [facility], OT/L  [phone] 02/18/2017   Georgeanna Radziewicz,H[place] 02/18/2017, 2:58 PM

## 2017-02-18 NOTE — Progress Notes (Signed)
Physical Therapy Treatment Patient Details Name: Ethan Faulkner MRN: [id number] DOB: 01-14-1916 Today's Date: 02/18/2017    History of Present Illness 83 y.o. male with a history of atrial fibrillation and recently discontinued anticoagulation, hypertension, hyperlipidemia and old stroke, brought to the ED at [facility] with acute onset of inability to speak and staring to his left side along with weakness involving right arm and right leg.     PT Comments    Pt is progressing with gait and was able, with chair to follow, to make it down the hallway with RW.  He continues to be very apraxic and slow to process basic commands.  His son reports [facility] is where he has been in the past.    Follow Up Recommendations  SNF;Supervision/Assistance - 24 hour (Family requesting [facility])     Equipment Recommendations  None recommended by PT    Recommendations for Other Services   NA     Precautions / Restrictions Precautions Precautions: Fall Restrictions Weight Bearing Restrictions: No    Mobility  Bed Mobility Overal bed mobility: Needs Assistance Bed Mobility: Supine to Sit     Supine to sit: Mod assist;HOB elevated     General bed mobility comments: Mod assist to initiate movement of legs (and then pt would put them back in the bed) and then trunk to get to sitting EOB.  Visual cues used to get him to understand we were moving to EOB.   Transfers Overall transfer level: Needs assistance Equipment used: Rolling walker (2 wheeled) Transfers: Sit to/from Stand Sit to Stand: Mod assist;From elevated surface         General transfer comment: Mod assist from higher bed and mod assist from lower chair.  Assist needed to support trunk over flexed legs.    Ambulation/Gait Ambulation/Gait assistance: Mod assist;+2 safety/equipment Ambulation Distance (Feet): 110 Feet (10' x1, 100'x1 ) Assistive device: Rolling walker (2 wheeled) Gait Pattern/deviations:  Step-through pattern;Decreased step length - right;Decreased stance time - right;Decreased dorsiflexion - right;Decreased weight shift to left;Narrow base of support;Trunk flexed Gait velocity: decreased   General Gait Details: Pt with functional weakness noted in right leg due to decrease drag and decreased ability to progress it forward. Verbal cues to look up at son as well as manual facilitation at trunk.  Chair to follow to increase safety and encourage increased gait distance.  Son pusing IV pole.        Modified Rankin (Stroke Patients Only) Modified Rankin (Stroke Patients Only) Pre-Morbid Rankin Score: Moderate disability Modified Rankin: Moderately severe disability     Balance Overall balance assessment: Needs assistance Sitting-balance support: Feet supported;Bilateral upper extremity supported Sitting balance-Leahy Scale: Fair Sitting balance - Comments: Supervision once positione EOB with feet flat and hips square   Standing balance support: Bilateral upper extremity supported Standing balance-Leahy Scale: Poor Standing balance comment: mod assist and used RW                            Cognition Arousal/Alertness: Awake/alert Behavior During Therapy: Flat affect Overall Cognitive Status: Difficult to assess Area of Impairment: Attention;Memory;Following commands;Safety/judgement;Awareness;Problem solving                   Current Attention Level: Sustained (breif,with structure and allowed time)   Following Commands: Follows one step commands with increased time;Follows one step commands inconsistently Safety/Judgement: Decreased awareness of safety;Decreased awareness of deficits Awareness: Intellectual Problem Solving: Slow processing;Decreased  initiation;Difficulty sequencing;Requires verbal cues;Requires tactile cues General Comments: Pt is apraxic, does well with multimodal cues, copies what you are doing, follows one step command with  significant delay.  Not speaking a word at all.  did smile at the very end of the session.              Pertinent Vitals/Pain Pain Assessment: Faces Faces Pain Scale: No hurt           PT Goals (current goals can now be found in the care plan section) Acute Rehab PT Goals Patient Stated Goal: walk per son in room during session Progress towards PT goals: Progressing toward goals    Frequency    Min 3X/week      PT Plan Current plan remains appropriate       AM-PAC PT "6 Clicks" Daily Activity  Outcome Measure  Difficulty turning over in bed (including adjusting bedclothes, sheets and blankets)?: Unable Difficulty moving from lying on back to sitting on the side of the bed? : Unable Difficulty sitting down on and standing up from a chair with arms (e.g., wheelchair, bedside commode, etc,.)?: Unable Help needed moving to and from a bed to chair (including a wheelchair)?: A Lot Help needed walking in hospital room?: A Lot Help needed climbing 3-5 steps with a railing? : Total 6 Click Score: 8    End of Session Equipment Utilized During Treatment: Gait belt Activity Tolerance: Patient limited by fatigue Patient left: in chair;with call bell/phone within reach;with chair alarm set;with family/visitor present   PT Visit Diagnosis: Muscle weakness (generalized) (M62.81);Other symptoms and signs involving the nervous system (M35.361)     Time: 4431-5400 PT Time Calculation (min) (ACUTE ONLY): 21 min  Charges:  $Gait Training: 8-22 mins          Massey Ruhland B. [place], [place], DPT [phone]            02/18/2017, 12:17 PM

## 2017-02-18 NOTE — Progress Notes (Signed)
  Speech Language Pathology Treatment: Dysphagia;Cognitive-Linquistic  Patient Details Name: Ethan Faulkner MRN: [id number] DOB: 1915-11-21 Today's Date: 02/18/2017 Time: 4742-5956 SLP Time Calculation (min) (ACUTE ONLY): 30 min  Assessment / Plan / Recommendation Clinical Impression  Skilled treatment session focused on dysphagia and language goals. SLP facilitated session by providing skilled observation of pt consuming dysphagia 1 with thin liquids. Pt with increased oral prep d/t decreased bolus manipulation and tongue thrust with AP transfer of puree boluses. Pt with intermittent oral holding of puree and thin liquids and requires Max A cues during these intermittent episodes of holding. Pt with delayed cough x 1 at end of meal. Given decreased lingual manipulation of puree bolus, recommend continuing current die tof puree with thin liquids.   SLP further facilitated session by providing Max A cues for multimodal communication to increase wants and needs. Pt able to indicate yes/no with gestures but unable to indicate wants and needs with yes/no. Education provided to pt's son.    HPI HPI: Pt is an 83 y.o.malewith a history of atrial fibrillation and recently discontinued anticoagulation, hypertension, dysphagia, hyperlipidemia and old stroke, brought to the ED at [facility] with acute onset of inability to speak and staring to his left side along with weakness involving right arm and right leg. He was noted to have findings indicative of acute left MCA territory ischemic infarction. CT scan of his head showed no acute intracranial abnormality; new age indeterminate right basal ganglia lacunar infarct, new nonacute right thalamus infarct, old small right cerebellar infarct and stable moderate to severe chronic small vessel ischemic disease. He was deemed a candidate for IV TPA which was administered. Modified barium swallow study performed on 06/08/16; results show mild/mod  oropharyngeal dysphagia with recommended diet of dys 3 (mech soft) with thin liquids.       SLP Plan  Continue with current plan of care       Recommendations  Diet recommendations: Dysphagia 1 (puree);Thin liquid Liquids provided via: Cup;Straw Medication Administration: Crushed with puree Supervision: Staff to assist with self feeding;Full supervision/cueing for compensatory strategies Compensations: Minimize environmental distractions;Slow rate;Small sips/bites;Follow solids with liquid Postural Changes and/or Swallow Maneuvers: Seated upright 90 degrees;Out of bed for meals                Oral Care Recommendations: Oral care before and after PO Follow up Recommendations: Skilled Nursing facility SLP Visit Diagnosis: Dysphagia, oral phase (R13.11);Aphasia (R47.01);Dysarthria and anarthria (R47.1);Cognitive communication deficit (R41.841) Plan: Continue with current plan of care       [place] 02/18/2017, 1:46 PM

## 2017-02-18 NOTE — Care Management Important Message (Signed)
Important Message  Patient Details  Name: MATH BRAZIE MRN: [id number] Date of Birth: 1916-03-07   Medicare Important Message Given:  Yes    Orbie Pyo 02/18/2017, 2:14 PM

## 2017-02-18 NOTE — Progress Notes (Deleted)
ANTICOAGULATION CONSULT NOTE - Initial Consult  Pharmacy Consult for apixaban Indication: atrial fibrillation  Allergies  Allergen Reactions  . Xanax [Alprazolam] Other (See Comments)    Dizzy, Sedated  . Tape Rash    PATIENT'S SKIN WILL TEAR/PLEASE EITHER USE PAPER TAPE OR COBAN WRAP!!    Patient Measurements: Height: 5\' 10"  (177.8 cm) Weight: 161 lb 13.1 oz (73.4 kg) IBW/kg (Calculated) : 73  Vital Signs: Temp: 97.5 F (36.4 C) (09/27 1410) Temp Source: Oral (09/27 1410) BP: 145/68 (09/27 1410) Pulse Rate: 77 (09/27 1410)  Labs:  Recent Labs  02/16/17 0244 02/17/17 0532 02/17/17 1115 02/18/17 0201  HGB 12.7* 12.1*  --  12.6*  HCT 38.1* 35.6*  --  37.1*  PLT 170 160  --  168  CREATININE 0.83 0.86  --  0.95  TROPONINI  --  0.31* 0.24* 0.21*    Estimated Creatinine Clearance: 44.8 mL/min (by C-G formula based on SCr of 0.95 mg/dL).   Medical History: Past Medical History:  Diagnosis Date  . Afib ([place])   . Atrial fibrillation ([place])   . Cerebral infarction ([place])   . DDD (degenerative disc disease), lumbar   . Dysphagia   . Hypertension   . Hypothyroidism   . Migraine    "maybe monthly" (11/21/2015)  . Prostate cancer ([place]) ~ 1995   S/P radiation tx  . Renal mass    "slow growing something; doctors just watching it right now" (11/21/2015)  . Stroke [facility])    TIA  . Thrombocytopenia ([place])   . Thyroid disease    hypothyroid    Medications:  Scheduled:  . amLODipine  2.5 mg Oral Daily  . [START ON 02/19/2017] apixaban  5 mg Oral BID  . aspirin EC  325 mg Oral Daily  . atorvastatin  40 mg Oral q1800  . chlorhexidine  15 mL Mouth Rinse BID  . levothyroxine  25 mcg Oral QAC breakfast  . mouth rinse  15 mL Mouth Rinse q12n4p  . metoprolol succinate  25 mg Oral QPM  . pantoprazole  40 mg Oral Daily    Assessment: 68 yom admitted for L MCA infarct and R small MCA/ACA infarct likely embolic. Pharmacy consulted to manage apixaban for atrial fibrillation.  Patient had been on apixaban 5 mg twice daily prior to admission. Of note, has been off apixaban due to hematuria. CBC and renal function stable. Received 40 mg enoxaparin for DVT prophylaxis at noon today.   Goal of Therapy:  Monitor platelets by anticoagulation protocol: Yes   Plan:  Apixaban 5 mg twice daily starting tonight at 2200 Monitor renal function, CBC, and signs/symptoms of bleeding, especially hematuria  Doylene Canard, PharmD Clinical Pharmacist  Phone: [phone] 02/18/2017,2:26 PM

## 2017-02-18 NOTE — Progress Notes (Signed)
STROKE TEAM PROGRESS NOTE   SUBJECTIVE (INTERVAL HISTORY) His daughter is at the bedside. No acute event over night. He is sitting in chair. Urine clear but more concentrated than yesterday. Daughter said pt does not like drinking water. Will start eliquis tonight.    OBJECTIVE Temp:  [97.5 F (36.4 C)-99 F (37.2 C)] 97.5 F (36.4 C) (09/27 2042) Pulse Rate:  [73-87] 84 (09/27 2042) Cardiac Rhythm: Atrial fibrillation;Bundle branch block (09/27 2033) Resp:  [16-18] 16 (09/27 2042) BP: (142-167)/(65-98) 160/79 (09/27 2042) SpO2:  [94 %-98 %] 96 % (09/27 2042)   Recent Labs Lab 02/14/17 1833  GLUCAP 117*    Recent Labs Lab 02/14/17 1825 02/14/17 1840 02/16/17 0244 02/17/17 0532 02/18/17 0201  NA 139 141 136 137 134*  K 4.2 4.4 3.5 3.2* 3.1*  CL 104 104 108 103 102  CO2 26  --  22 23 23   GLUCOSE 131* 126* 95 80 99  BUN 16 17 12 12 18   CREATININE 0.96 1.00 0.83 0.86 0.95  CALCIUM 9.3  --  8.8* 8.6* 8.5*    Recent Labs Lab 02/14/17 1825  AST 23  ALT 13*  ALKPHOS 59  BILITOT 0.9  PROT 7.3  ALBUMIN 4.0    Recent Labs Lab 02/14/17 1825 02/14/17 1840 02/16/17 0244 02/17/17 0532 02/18/17 0201  WBC 5.4  --  5.8 4.5 5.3  NEUTROABS 2.3  --   --   --   --   HGB 14.2 14.3 12.7* 12.1* 12.6*  HCT 41.2 42.0 38.1* 35.6* 37.1*  MCV 93.4  --  92.7 91.8 90.9  PLT 179  --  170 160 168    Recent Labs Lab 02/14/17 2340 02/15/17 1018 02/17/17 0532 02/17/17 1115 02/18/17 0201  TROPONINI <0.03 0.20* 0.31* 0.24* 0.21*   No results for input(s): LABPROT, INR in the last 72 hours. No results for input(s): COLORURINE, LABSPEC, [place], GLUCOSEU, HGBUR, BILIRUBINUR, KETONESUR, PROTEINUR, UROBILINOGEN, NITRITE, LEUKOCYTESUR in the last 72 hours.  Invalid input(s): APPERANCEUR     Component Value Date/Time   CHOL 150 02/15/2017 1018   TRIG 53 02/15/2017 1018   HDL 43 02/15/2017 1018   CHOLHDL 3.5 02/15/2017 1018   VLDL 11 02/15/2017 1018   LDLCALC 96 02/15/2017  1018   Lab Results  Component Value Date   HGBA1C 5.4 02/15/2017      Component Value Date/Time   LABOPIA NONE DETECTED 05/21/2016 2034   COCAINSCRNUR NONE DETECTED 05/21/2016 2034   LABBENZ NONE DETECTED 05/21/2016 2034   AMPHETMU NONE DETECTED 05/21/2016 2034   THCU NONE DETECTED 05/21/2016 2034   LABBARB NONE DETECTED 05/21/2016 2034    No results for input(s): ETH in the last 168 hours.  I have personally reviewed the radiological images below and agree with the radiology interpretations.  Ct Angio Head and neck W/cm &/or Wo Cm 02/14/2017 IMPRESSION: No significant carotid or vertebral artery stenosis in the neck Diffuse intracranial atherosclerotic disease including the middle cerebral arteries bilaterally. Moderate stenosis P2 segment bilaterally in the posterior cerebral arteries. No intracranial large vessel occlusion. No intracranial hemorrhage identified.  Ct Head Code Stroke Wo Contrast 02/14/2017 IMPRESSION: 1. New age indeterminate RIGHT basal ganglia lacunar infarct. 2. New nonacute RIGHT thalamus infarct. 3. Old small RIGHT cerebellar infarct. Stable moderate to severe chronic small vessel ischemic disease. 4. ASPECTS is 10.   Ct Head Wo Contrast 02/15/2017 IMPRESSION: 1. New, acute LEFT MCA territory nonhemorrhagic infarct. 2. Old lacunar infarcts and moderate to severe chronic small vessel ischemic disease.  Dg Chest Port 1 View 02/16/2017 IMPRESSION: 1. Negative for pulmonary edema. 2. Low volume chest with mild atelectasis.    Mr Brain Limited Wo Contrast 02/16/2017 IMPRESSION: Moderately large acute infarct in the left parietal lobe Small acute infarct right posterior parietal lobe.   TTE  - The patient was in atrial fibrillation. Normal LV size with mild LV hypertrophy. EF 55-60%. Mildly dilated RV with mildly decreased systolic function. Biatrial enlargement. Mild pulmonary hypertension. Mild aortic insufficiency. Moderate TR.   PHYSICAL EXAM  Temp:   [97.5 F (36.4 C)-99 F (37.2 C)] 97.5 F (36.4 C) (09/27 2042) Pulse Rate:  [73-87] 84 (09/27 2042) Resp:  [16-18] 16 (09/27 2042) BP: (142-167)/(65-98) 160/79 (09/27 2042) SpO2:  [94 %-98 %] 96 % (09/27 2042)  General - Well nourished, well developed, not in acute distress  Ophthalmologic - not cooperation on exam  Cardiovascular - irregularly irregular heart rate and rhythm.  Neuro - awake alert, able to follow several central and peripheral commands such as eye close and opening, showing fingers and fist. Still has expressive aphasia, no language output. Able to mimic. Right neglect resolved. Eyes moving both directions. PERRL, facial symmetric, blinking to visual threat bilaterally. Tongue middle in mouth. Spontaneous moving all extremities, however, weaker 4-/5 RUE and 4+/5 LUE and 3/5 BLEs. DTR 1+ and negative babinski. Sensation, coordination and gait not tested.   ASSESSMENT/PLAN [name] is a 83 y.o. male with history of afib off eliquis for a week due to hematuria, HTN, prostate cancer and renal mass, previous stroke in 04/2016 admitted for aphasia, left gaze and right side weakness. S/p tPA. Some motor improvement but still left gaze and global aphasia.    Stroke:  left moderate sized MCA infarct and right small MCA/ACA infarct - embolic secondary to Afib off eliquis due to hematuria  MRI  Moderately large acute infarct in the left parietal lobe Small acute infarct right posterior parietal lobe.  CTA head and neck - diffuse intracranial stenosis especially b/l P2  2D Echo  EF 55-60%  LDL 96  HgbA1c 5.4  SCDs for VTE prophylaxis  DIET - DYS 1 Room service appropriate? Yes; Fluid consistency: Thin   No antithrombotic prior to admission, now on ASA for stroke prevention. Will resume eliquis tonight (day 4) due to moderate sized infarct.  Ongoing aggressive stroke risk factor management  Therapy recommendations:  pending  Disposition:  pending  Afib  off eliquis due to hematuria  Hematuria one week PTA  Off eliquis for one week  Foley catheter placed  Urine became clear last week  Plan to follow up with PCP and urology 02/15/17 for plan of foley catheter  Now on ASA  Would resume eliquis tonight (day 4) and watch for hematuria.  Elevated troponin  0.03->0.20->0.31->0.24->0.21  likely demand ischemia due to stroke  Hematuria  One week PTA, s/p foley and off eliquis  Hematuria resolve over time  However, recurred after tPA, now resolved again  Continue foley for now  Observe closely after eliquis resumed  Hypertension  Home meds:   Norvasc, metoprolol  Stable, but on the high side  Resumed home meds  Hyperlipidemia  Home meds:  none   LDL 96, goal < 70  Add atorvastatin 20 mg PO daily  Continue statin at discharge  Dysphagia  Passed swallow 02/16/2017  On diet  Resume home meds   Encourage po intake  Other Stroke Risk Factors  Advanced age  Hx stroke/TIA - 04/2016 - right  IC/BG infarct - continued eliquis  Migraines  Other Active Problems  Prostate cancer s/p rad and chemo  Renal mass - continue observation  Other Pertinent History  Hypokalemia - supplement  Hospital day # 4   Rosalin Hawking, MD PhD Stroke Neurology 02/18/2017 9:32 PM    To contact Stroke Continuity provider, please refer to [email or web address] After hours, contact General Neurology

## 2017-02-18 NOTE — Progress Notes (Addendum)
ANTICOAGULATION CONSULT NOTE - Initial Consult  Pharmacy Consult for apixaban Indication: atrial fibrillation  Allergies  Allergen Reactions  . Xanax [Alprazolam] Other (See Comments)    Dizzy, Sedated  . Tape Rash    PATIENT'S SKIN WILL TEAR/PLEASE EITHER USE PAPER TAPE OR COBAN WRAP!!    Patient Measurements: Height: 5\' 10"  (177.8 cm) Weight: 161 lb 13.1 oz (73.4 kg) IBW/kg (Calculated) : 73  Vital Signs: Temp: 98.8 F (37.1 C) (09/27 0945) Temp Source: Oral (09/27 0945) BP: 157/83 (09/27 0945) Pulse Rate: 87 (09/27 0945)  Labs:  Recent Labs  02/16/17 0244 02/17/17 0532 02/17/17 1115 02/18/17 0201  HGB 12.7* 12.1*  --  12.6*  HCT 38.1* 35.6*  --  37.1*  PLT 170 160  --  168  CREATININE 0.83 0.86  --  0.95  TROPONINI  --  0.31* 0.24* 0.21*    Estimated Creatinine Clearance: 44.8 mL/min (by C-G formula based on SCr of 0.95 mg/dL).   Medical History: Past Medical History:  Diagnosis Date  . Afib ([place])   . Atrial fibrillation ([place])   . Cerebral infarction ([place])   . DDD (degenerative disc disease), lumbar   . Dysphagia   . Hypertension   . Hypothyroidism   . Migraine    "maybe monthly" (11/21/2015)  . Prostate cancer ([place]) ~ 1995   S/P radiation tx  . Renal mass    "slow growing something; doctors just watching it right now" (11/21/2015)  . Stroke [facility])    TIA  . Thrombocytopenia ([place])   . Thyroid disease    hypothyroid    Medications:  Scheduled:  . amLODipine  2.5 mg Oral Daily  . aspirin EC  325 mg Oral Daily  . atorvastatin  40 mg Oral q1800  . chlorhexidine  15 mL Mouth Rinse BID  . enoxaparin (LOVENOX) injection  40 mg Subcutaneous Q24H  . levothyroxine  25 mcg Oral QAC breakfast  . mouth rinse  15 mL Mouth Rinse q12n4p  . metoprolol succinate  25 mg Oral QPM  . pantoprazole  40 mg Oral Daily    Assessment: 46 yom admitted for L MCA infarct and R small MCA/ACA infarct likely embolic. Pharmacy consulted to manage apixaban for atrial  fibrillation. Patient had been on apixaban 5 mg twice daily prior to admission. Of note, has been off apixaban due to hematuria. CBC and renal function stable. Received 40 mg enoxaparin for DVT prophylaxis at noon today.   Goal of Therapy:  Monitor platelets by anticoagulation protocol: Yes   Plan:  Apixaban 5 mg twice daily starting tonight Monitor renal function, CBC, and signs/symptoms of bleeding, especially hematuria  Doylene Canard, PharmD Clinical Pharmacist  Phone: [phone] 02/18/2017,12:22 PM

## 2017-02-18 NOTE — Discharge Instructions (Addendum)
·   Disposition: Discharge to SNF   Eliquis 5mg  twice a day for secondary stroke prevention.  Ongoing risk factor control by Primary Care Physician  Follow-up Dr. Wolfgang Phoenix MD in 1-2 weeks.  Follow-up with Stroke Clinic in 6 weeks, office to schedule an appointment.  Keep foley catheter until seeing urologist   Watch for bloody urine  Follow up with [facility] [phone]) , a referral has been placed for you.  The office will call you. Also encourage daughter to call to facilitate the appointment.   Please encourage the patient to increase intake of oral fluids.   Information on my medicine - ELIQUIS (apixaban)  This medication education was reviewed with me or my healthcare representative as part of my discharge preparation.  The pharmacist that spoke with me during my hospital stay was:  Betsey Holiday, [facility]  Why was Eliquis prescribed for you? Eliquis was prescribed for you to reduce the risk of a blood clot forming that can cause a stroke if you have a medical condition called atrial fibrillation (a type of irregular heartbeat).  What do You need to know about Eliquis ? Take your Eliquis TWICE DAILY - one tablet in the morning and one tablet in the evening with or without food. If you have difficulty swallowing the tablet whole please discuss with your pharmacist how to take the medication safely.  Take Eliquis exactly as prescribed by your doctor and DO NOT stop taking Eliquis without talking to the doctor who prescribed the medication.  Stopping may increase your risk of developing a stroke.  Refill your prescription before you run out.  After discharge, you should have regular check-up appointments with your healthcare provider that is prescribing your Eliquis.  In the future your dose may need to be changed if your kidney function or weight changes by a significant amount or as you get older.  What do you do if you miss a dose? If you miss a dose,  take it as soon as you remember on the same day and resume taking twice daily.  Do not take more than one dose of ELIQUIS at the same time to make up a missed dose.  Important Safety Information A possible side effect of Eliquis is bleeding. You should call your healthcare provider right away if you experience any of the following: ? Bleeding from an injury or your nose that does not stop. ? Unusual colored urine (red or dark brown) or unusual colored stools (red or black). ? Unusual bruising for unknown reasons. ? A serious fall or if you hit your head (even if there is no bleeding).  Some medicines may interact with Eliquis and might increase your risk of bleeding or clotting while on Eliquis. To help avoid this, consult your healthcare provider or pharmacist prior to using any new prescription or non-prescription medications, including herbals, vitamins, non-steroidal anti-inflammatory drugs (NSAIDs) and supplements.  This website has more information on Eliquis (apixaban): [email or web address].[email or web address]/home

## 2017-02-19 ENCOUNTER — Inpatient Hospital Stay
Admission: RE | Admit: 2017-02-19 | Discharge: 2017-03-02 | Disposition: A | Discharge status: Nursing Home (Includes ICF) | Payer: Medicare Other | Source: Ambulatory Visit | Attending: Internal Medicine | Admitting: Internal Medicine

## 2017-02-19 DIAGNOSIS — R310 Gross hematuria: Secondary | ICD-10-CM | POA: Diagnosis not present

## 2017-02-19 DIAGNOSIS — C642 Malignant neoplasm of left kidney, except renal pelvis: Secondary | ICD-10-CM | POA: Diagnosis not present

## 2017-02-19 DIAGNOSIS — F0390 Unspecified dementia without behavioral disturbance: Secondary | ICD-10-CM | POA: Diagnosis not present

## 2017-02-19 DIAGNOSIS — R338 Other retention of urine: Secondary | ICD-10-CM | POA: Diagnosis not present

## 2017-02-19 DIAGNOSIS — M79641 Pain in right hand: Secondary | ICD-10-CM | POA: Diagnosis not present

## 2017-02-19 DIAGNOSIS — Z87891 Personal history of nicotine dependence: Secondary | ICD-10-CM | POA: Diagnosis not present

## 2017-02-19 DIAGNOSIS — I10 Essential (primary) hypertension: Secondary | ICD-10-CM | POA: Diagnosis not present

## 2017-02-19 DIAGNOSIS — M6281 Muscle weakness (generalized): Secondary | ICD-10-CM | POA: Diagnosis not present

## 2017-02-19 DIAGNOSIS — Z7901 Long term (current) use of anticoagulants: Secondary | ICD-10-CM | POA: Diagnosis not present

## 2017-02-19 DIAGNOSIS — G464 Cerebellar stroke syndrome: Secondary | ICD-10-CM | POA: Diagnosis not present

## 2017-02-19 DIAGNOSIS — N3040 Irradiation cystitis without hematuria: Secondary | ICD-10-CM | POA: Diagnosis not present

## 2017-02-19 DIAGNOSIS — I63412 Cerebral infarction due to embolism of left middle cerebral artery: Secondary | ICD-10-CM | POA: Diagnosis not present

## 2017-02-19 DIAGNOSIS — R233 Spontaneous ecchymoses: Secondary | ICD-10-CM | POA: Diagnosis not present

## 2017-02-19 DIAGNOSIS — R2689 Other abnormalities of gait and mobility: Secondary | ICD-10-CM | POA: Diagnosis not present

## 2017-02-19 DIAGNOSIS — Z9181 History of falling: Secondary | ICD-10-CM | POA: Diagnosis not present

## 2017-02-19 DIAGNOSIS — R279 Unspecified lack of coordination: Secondary | ICD-10-CM | POA: Diagnosis not present

## 2017-02-19 DIAGNOSIS — R410 Disorientation, unspecified: Secondary | ICD-10-CM | POA: Diagnosis not present

## 2017-02-19 DIAGNOSIS — R600 Localized edema: Secondary | ICD-10-CM | POA: Diagnosis not present

## 2017-02-19 DIAGNOSIS — Z79899 Other long term (current) drug therapy: Secondary | ICD-10-CM | POA: Diagnosis not present

## 2017-02-19 DIAGNOSIS — R1312 Dysphagia, oropharyngeal phase: Secondary | ICD-10-CM | POA: Diagnosis not present

## 2017-02-19 DIAGNOSIS — R319 Hematuria, unspecified: Secondary | ICD-10-CM

## 2017-02-19 DIAGNOSIS — I482 Chronic atrial fibrillation: Secondary | ICD-10-CM | POA: Diagnosis not present

## 2017-02-19 DIAGNOSIS — M7989 Other specified soft tissue disorders: Secondary | ICD-10-CM | POA: Diagnosis not present

## 2017-02-19 DIAGNOSIS — E038 Other specified hypothyroidism: Secondary | ICD-10-CM | POA: Diagnosis not present

## 2017-02-19 DIAGNOSIS — R29810 Facial weakness: Secondary | ICD-10-CM | POA: Diagnosis not present

## 2017-02-19 DIAGNOSIS — E785 Hyperlipidemia, unspecified: Secondary | ICD-10-CM | POA: Diagnosis not present

## 2017-02-19 DIAGNOSIS — Z8673 Personal history of transient ischemic attack (TIA), and cerebral infarction without residual deficits: Secondary | ICD-10-CM | POA: Diagnosis not present

## 2017-02-19 DIAGNOSIS — R4182 Altered mental status, unspecified: Secondary | ICD-10-CM | POA: Diagnosis not present

## 2017-02-19 DIAGNOSIS — I69391 Dysphagia following cerebral infarction: Secondary | ICD-10-CM | POA: Diagnosis not present

## 2017-02-19 DIAGNOSIS — E039 Hypothyroidism, unspecified: Secondary | ICD-10-CM | POA: Diagnosis not present

## 2017-02-19 DIAGNOSIS — M79631 Pain in right forearm: Secondary | ICD-10-CM | POA: Diagnosis not present

## 2017-02-19 DIAGNOSIS — Z23 Encounter for immunization: Secondary | ICD-10-CM | POA: Diagnosis not present

## 2017-02-19 DIAGNOSIS — Z5189 Encounter for other specified aftercare: Secondary | ICD-10-CM | POA: Diagnosis not present

## 2017-02-19 DIAGNOSIS — I639 Cerebral infarction, unspecified: Secondary | ICD-10-CM | POA: Diagnosis not present

## 2017-02-19 DIAGNOSIS — D696 Thrombocytopenia, unspecified: Secondary | ICD-10-CM | POA: Diagnosis not present

## 2017-02-19 DIAGNOSIS — I69320 Aphasia following cerebral infarction: Secondary | ICD-10-CM | POA: Diagnosis not present

## 2017-02-19 MED ORDER — ATORVASTATIN CALCIUM 20 MG PO TABS: 20.0000 mg | ORAL_TABLET | Freq: Every day | ORAL | 0 refills | Status: DC

## 2017-02-19 MED ORDER — AMLODIPINE BESYLATE 5 MG PO TABS: 5.0000 mg | ORAL_TABLET | Freq: Every day | ORAL | 0 refills | Status: DC

## 2017-02-19 MED ORDER — LEVOTHYROXINE SODIUM 25 MCG PO TABS: 25.0000 ug | ORAL_TABLET | Freq: Every day | ORAL | 0 refills | Status: DC

## 2017-02-19 MED ORDER — METOPROLOL SUCCINATE ER 25 MG PO TB24: 25.0000 mg | ORAL_TABLET | Freq: Every evening | ORAL | 0 refills | Status: DC

## 2017-02-19 MED ORDER — POLYETHYLENE GLYCOL 3350 17 GM/SCOOP PO POWD: ORAL | 0 refills | Status: DC

## 2017-02-19 MED ORDER — APIXABAN 5 MG PO TABS: 5.0000 mg | ORAL_TABLET | Freq: Two times a day (BID) | ORAL | 0 refills | Status: DC

## 2017-02-19 MED ORDER — INFLUENZA VAC SPLIT HIGH-DOSE 0.5 ML IM SUSY: 0.5000 mL | PREFILLED_SYRINGE | Freq: Once | INTRAMUSCULAR | 0 refills | Status: DC

## 2017-02-19 MED ORDER — ASPIRIN 325 MG PO TBEC: 325.0000 mg | DELAYED_RELEASE_TABLET | Freq: Every day | ORAL | 0 refills | Status: DC

## 2017-02-19 NOTE — Clinical Social Work Note (Signed)
CSW facilitated patient discharge including contacting patient family and facility to confirm patient discharge plans. Clinical information faxed to facility and family agreeable with plan. CSW arranged ambulance transport via [facility] to [facility]. RN to call report prior to discharge ([phone]).  CSW will sign off for now as social work intervention is no longer needed. Please consult us again if new needs arise.  Sireen Halk, CSW [phone]   

## 2017-02-19 NOTE — Clinical Social Work Placement (Signed)
   CLINICAL SOCIAL WORK PLACEMENT  NOTE  Date:  02/19/2017  Patient Details  Name: Ethan Faulkner MRN: [id number] Date of Birth: 1916/03/10  Clinical Social Work is seeking post-discharge placement for this patient at the [place] level of care (*CSW will initial, date and re-position this form in  chart as items are completed):  Yes   Patient/family provided with [place] Work Department's list of facilities offering this level of care within the geographic area requested by the patient (or if unable, by the patient's family).  Yes   Patient/family informed of their freedom to choose among providers that offer the needed level of care, that participate in Medicare, Medicaid or managed care program needed by the patient, have an available bed and are willing to accept the patient.  Yes   Patient/family informed of [facility]'s ownership interest in [facility] and [facility], as well as of the fact that they are under no obligation to receive care at these facilities.  PASRR submitted to EDS on 02/17/17     PASRR number received on       Existing PASRR number confirmed on 02/17/17     FL2 transmitted to all facilities in geographic area requested by pt/family on 02/17/17     FL2 transmitted to all facilities within larger geographic area on       Patient informed that his/her managed care company has contracts with or will negotiate with certain facilities, including the following:        Yes   Patient/family informed of bed offers received.  Patient chooses bed at [facility]     Physician recommends and patient chooses bed at      Patient to be transferred to [facility] on 02/19/17.  Patient to be transferred to facility by [facility]     Patient family notified on 02/19/17 of transfer.  Name of family member notified:  [name]     PHYSICIAN Please prepare prescriptions     Additional Comment:     _______________________________________________ Candie Chroman, LCSW 02/19/2017, 3:56 PM

## 2017-02-19 NOTE — Progress Notes (Signed)
Discharge orders received.  Discharge instructions and follow-up appointments reviewed with the patient and his daughter.  VSS upon discharge.  IV removed and education complete.  Report called to [facility].  Transported via [facility].  Cori Razor, RN

## 2017-02-19 NOTE — Care Management Note (Signed)
Case Management Note  Patient Details  Name: Ethan Faulkner MRN: [id number] Date of Birth: 06-16-1915  Subjective/Objective:                    Action/Plan: Pt discharging to [facility] No further needs per CM.   Expected Discharge Date:  02/19/17               Expected Discharge Plan:  [place]  In-House Referral:  Clinical Social Work  Discharge planning Services  CM Consult  Post Acute Care Choice:    Choice offered to:     DME Arranged:    DME Agency:     HH Arranged:    [place] Agency:     Status of Service:  Completed, signed off  If discussed at [employer], dates discussed:    Additional Comments:  Pollie Friar, RN 02/19/2017, 3:08 PM

## 2017-02-19 NOTE — Discharge Summary (Addendum)
Stroke Discharge Summary  Patient ID: Ethan Faulkner   MRN: [id number]      DOB: March 02, 1916  Date of Admission: 02/14/2017 Date of Discharge: 02/19/2017  Attending Physician:  Ethan Hawking, MD, Stroke MD Consultant(s):    None Patient's PCP:  Ethan Drown, MD  DISCHARGE DIAGNOSIS: Active Problems:   CVA (cerebral vascular accident) ([place]) - left moderate sized MCA infarct and right small MCA/ACA infarct - embolic secondary to Afib off eliquis due to hematuria   Hematuria, resolved   afib   Elevated troponin - resolved   HTN   HLD   Dysphagia   Hx of stroke   Prostate cancer s/p radiation and chemo   Renal mass    hypokalemia   Past Medical History:  Diagnosis Date  . Afib ([place])   . Atrial fibrillation ([place])   . Cerebral infarction ([place])   . DDD (degenerative disc disease), lumbar   . Dysphagia   . Hypertension   . Hypothyroidism   . Migraine    "maybe monthly" (11/21/2015)  . Prostate cancer ([place]) ~ 1995   S/P radiation tx  . Renal mass    "slow growing something; doctors just watching it right now" (11/21/2015)  . Stroke [facility])    TIA  . Thrombocytopenia ([place])   . Thyroid disease    hypothyroid   Past Surgical History:  Procedure Laterality Date  . COLONOSCOPY  1999  . ESOPHAGOGASTRODUODENOSCOPY    . LAPAROSCOPIC CHOLECYSTECTOMY  1980s  . PROSTATE BIOPSY  ~ 1995  . TOTAL HIP ARTHROPLASTY Left 11/22/2015   Procedure: LEFT HIP HEMIARTHROPLASTY ;  Surgeon: Leandrew Koyanagi, MD;  Location: [place];  Service: Orthopedics;  Laterality: Left;    Allergies as of 02/19/2017      Reactions   Xanax [alprazolam] Other (See Comments)   Dizzy, Sedated   Tape Rash   PATIENT'S SKIN WILL TEAR/PLEASE EITHER USE PAPER TAPE OR COBAN WRAP!!      Medication List    STOP taking these medications   cephALEXin 500 MG capsule Commonly known as:  KEFLEX     TAKE these medications   acetaminophen 325 MG tablet Commonly known as:  TYLENOL Take 325-650 mg by mouth every 6  (six) hours as needed for mild pain or moderate pain.   amLODipine 5 MG tablet Commonly known as:  NORVASC Take 1 tablet (5 mg total) by mouth daily. What changed:  medication strength  how much to take   apixaban 5 MG Tabs tablet Commonly known as:  ELIQUIS Take 1 tablet (5 mg total) by mouth 2 (two) times daily. What changed:  how much to take  how to take this  when to take this  additional instructions   atorvastatin 20 MG tablet Commonly known as:  LIPITOR Take 1 tablet (20 mg total) by mouth daily at 6 PM.   cetirizine 10 MG tablet Commonly known as:  ZYRTEC Take 10 mg by mouth every evening.   levothyroxine 25 MCG tablet Commonly known as:  SYNTHROID, LEVOTHROID Take 1 tablet (25 mcg total) by mouth daily before breakfast. What changed:  See the new instructions.   metoprolol succinate 25 MG 24 hr tablet Commonly known as:  TOPROL-XL Take 1 tablet (25 mg total) by mouth every evening.   polyethylene glycol powder powder Commonly known as:  GLYCOLAX/MIRALAX MIX 1 CAPFUL IN 8 OZ OF WATER AND DRINK twice weekly  Discharge Care Instructions        Start     Ordered   02/20/17 0000  amLODipine (NORVASC) 5 MG tablet  Daily     02/19/17 1420   02/20/17 0000  levothyroxine (SYNTHROID, LEVOTHROID) 25 MCG tablet  Daily before breakfast     02/19/17 1421   02/19/17 0000  atorvastatin (LIPITOR) 20 MG tablet  Daily-1800     02/19/17 1420   02/19/17 0000  metoprolol succinate (TOPROL-XL) 25 MG 24 hr tablet  Every evening     02/19/17 1420   02/19/17 0000  apixaban (ELIQUIS) 5 MG TABS tablet  2 times daily     02/19/17 1421   02/19/17 0000  polyethylene glycol powder (GLYCOLAX/MIRALAX) powder     02/19/17 1421   02/19/17 0000  Ambulatory referral to Neurology    Comments:  Pt will follow up with stroke MD Ethan Faulkner preferred, if not available, then consider Ethan Faulkner, Ethan Faulkner or Ethan Faulkner) at [facility] in about 2 months. Thanks.   02/19/17 1510   02/19/17 0000   Ambulatory referral to Urology     02/19/17 1510      LABORATORY STUDIES CBC    Component Value Date/Time   WBC 5.3 02/18/2017 0201   RBC 4.08 (L) 02/18/2017 0201   HGB 12.6 (L) 02/18/2017 0201   HGB 14.6 10/26/2016 1206   HCT 37.1 (L) 02/18/2017 0201   HCT 43.5 10/26/2016 1206   PLT 168 02/18/2017 0201   PLT 165 10/26/2016 1206   MCV 90.9 02/18/2017 0201   MCV 93 10/26/2016 1206   MCH 30.9 02/18/2017 0201   MCHC 34.0 02/18/2017 0201   RDW 13.1 02/18/2017 0201   RDW 15.7 (H) 10/26/2016 1206   LYMPHSABS 2.0 02/14/2017 1825   LYMPHSABS 1.4 10/26/2016 1206   MONOABS 0.7 02/14/2017 1825   EOSABS 0.2 02/14/2017 1825   EOSABS 0.1 10/26/2016 1206   BASOSABS 0.0 02/14/2017 1825   BASOSABS 0.0 10/26/2016 1206   CMP    Component Value Date/Time   NA 134 (L) 02/18/2017 0201   NA 141 10/26/2016 1206   K 3.1 (L) 02/18/2017 0201   CL 102 02/18/2017 0201   CO2 23 02/18/2017 0201   GLUCOSE 99 02/18/2017 0201   BUN 18 02/18/2017 0201   BUN 28 10/26/2016 1206   CREATININE 0.95 02/18/2017 0201   CREATININE 1.02 06/06/2013 1527   CALCIUM 8.5 (L) 02/18/2017 0201   PROT 7.3 02/14/2017 1825   PROT 6.7 10/26/2016 1206   ALBUMIN 4.0 02/14/2017 1825   ALBUMIN 4.2 10/26/2016 1206   AST 23 02/14/2017 1825   ALT 13 (L) 02/14/2017 1825   ALKPHOS 59 02/14/2017 1825   BILITOT 0.9 02/14/2017 1825   BILITOT 0.8 10/26/2016 1206   GFRNONAA >60 02/18/2017 0201   GFRAA >60 02/18/2017 0201   COAGS Lab Results  Component Value Date   INR 1.08 02/14/2017   INR 1.25 02/07/2017   INR 1.19 05/21/2016   Lipid Panel    Component Value Date/Time   CHOL 150 02/15/2017 1018   TRIG 53 02/15/2017 1018   HDL 43 02/15/2017 1018   CHOLHDL 3.5 02/15/2017 1018   VLDL 11 02/15/2017 1018   LDLCALC 96 02/15/2017 1018   HgbA1C  Lab Results  Component Value Date   HGBA1C 5.4 02/15/2017   Urinalysis    Component Value Date/Time   COLORURINE BIOCHEMICALS MAY BE AFFECTED BY COLOR (A) 02/07/2017  2118   APPEARANCEUR CLEAR 02/07/2017 2118   LABSPEC 1.006 02/07/2017 2118  PHURINE 6.0 02/07/2017 2118   GLUCOSEU 50 (A) 02/07/2017 2118   HGBUR MODERATE (A) 02/07/2017 2118   BILIRUBINUR NEGATIVE 02/07/2017 2118   BILIRUBINUR ++ 10/26/2016 1121   KETONESUR NEGATIVE 02/07/2017 2118   PROTEINUR 100 (A) 02/07/2017 2118   UROBILINOGEN 0.2 05/11/2016 1117   NITRITE NEGATIVE 02/07/2017 2118   LEUKOCYTESUR NEGATIVE 02/07/2017 2118   Urine Drug Screen     Component Value Date/Time   LABOPIA NONE DETECTED 05/21/2016 2034   COCAINSCRNUR NONE DETECTED 05/21/2016 2034   LABBENZ NONE DETECTED 05/21/2016 2034   AMPHETMU NONE DETECTED 05/21/2016 2034   THCU NONE DETECTED 05/21/2016 2034   LABBARB NONE DETECTED 05/21/2016 2034    Alcohol Level    Component Value Date/Time   ETH <5 05/21/2016 1726     SIGNIFICANT DIAGNOSTIC STUDIES I have personally reviewed the radiological images below and agree with the radiology interpretations.  Ct Angio Head and neck W/cm &/or Wo Cm 02/14/2017 IMPRESSION: No significant carotid or vertebral artery stenosis in the neck Diffuse intracranial atherosclerotic disease including the middle cerebral arteries bilaterally. Moderate stenosis P2 segment bilaterally in the posterior cerebral arteries. No intracranial large vessel occlusion. No intracranial hemorrhage identified.  Ct Head Code Stroke Wo Contrast 02/14/2017 IMPRESSION: 1. New age indeterminate RIGHT basal ganglia lacunar infarct. 2. New nonacute RIGHT thalamus infarct. 3. Old small RIGHT cerebellar infarct. Stable moderate to severe chronic small vessel ischemic disease. 4. ASPECTS is 10.   Ct Head Wo Contrast 02/15/2017 IMPRESSION: 1. New, acute LEFT MCA territory nonhemorrhagic infarct. 2. Old lacunar infarcts and moderate to severe chronic small vessel ischemic disease.   Dg Chest Port 1 View 02/16/2017 IMPRESSION: 1. Negative for pulmonary edema. 2. Low volume chest with mild atelectasis.     Mr Brain Limited Wo Contrast 02/16/2017 IMPRESSION: Moderately large acute infarct in the left parietal lobe Small acute infarct right posterior parietal lobe.   TTE  - The patient was in atrial fibrillation. Normal LV size with mild LV hypertrophy. EF 55-60%. Mildly dilated RV with mildly decreased systolic function. Biatrial enlargement. Mild pulmonary hypertension. Mild aortic insufficiency. Moderate TR.    HISTORY OF PRESENT ILLNESS Ethan Faulkner is an 83 y.o. male with a istory of atrial fibrillation and recently discontinued anticoagulation, hypertension, hyperlipidemia and old stroke, brought to the ED at [facility] with acute onset of inability to speak and staring to his left side along with weakness involving right arm and right leg. He was noted to have findings indicative of acute left MCA territory ischemic infarction. CT scan of his head showed no acute intracranial abnormality. He was deemed a candidate for IV TPA which was administered.  HOSPITAL COURSE Ethan Faulkner is a 83 y.o. male with history of afib off eliquis for a week due to hematuria, HTN, prostate cancer and renal mass, previous stroke in 04/2016 admitted for aphasia, left gaze and right side weakness. S/p tPA.     Stroke:  left moderate sized MCA infarct and right small MCA/ACA infarct - embolic secondary to Afib off eliquis due to hematuria  MRI  Moderately large acute infarct in the left parietal lobe Small acute infarct right posterior parietal lobe.  CTA head and neck - diffuse intracranial stenosis especially b/l P2  2D Echo  EF 55-60%  LDL 96  HgbA1c 5.4  SCDs for VTE prophylaxis  DIET - DYS 1 Room service appropriate? Yes; Fluid consistency: Thin   No antithrombotic prior to admission, now resumed eliquis on day  4 (02/18/17) due to moderate sized infarct.  Ongoing aggressive stroke risk factor management  Therapy recommendations:  SNF  Disposition:  pending  Afib off  eliquis due to hematuria  Hematuria one week PTA  Off eliquis for one week  Foley catheter placed  Urine became clear last week  Plan to follow up with PCP and urology 02/15/17 for plan of foley catheter  Now on ASA  Eliquis 5 mg PO BID resumed.  Continue to monitor the pt for hematuria after discharge.  Elevated troponin  0.03->0.20->0.31->0.24->0.21  likely demand ischemia due to stroke  Hematuria  One week PTA, s/p foley and off eliquis  Hematuria resolve over time  However, recurred after tPA, now resolved again  Continue foley for now  eliquis resumed, so far no hematuria  Observe closely  Follow up with urology in SNF - daughter is aware and will arrange.   Hypertension  Home meds:   Norvasc, metoprolol  Stable, but on the high side  Resumed home meds  Hyperlipidemia  Home meds:  none   LDL 96, goal < 70  Add atorvastatin 20 mg PO daily  Continue statin at discharge  Dysphagia  Passed swallow 02/16/2017  On diet  Resume home meds   Encourage fluid intake  Other Stroke Risk Factors  Advanced age  Hx stroke/TIA - 04/2016 - right IC/BG infarct - continued eliquis  Migraines  Other Active Problems  Prostate cancer s/p rad and chemo  Renal mass - continue observation  Other Pertinent History  Hypokalemia - supplement   DISCHARGE EXAM Blood pressure (!) 154/82, pulse 71, temperature 97.8 F (36.6 C), temperature source Oral, resp. rate 17, height 5\' 10"  (1.778 m), weight 161 lb 13.1 oz (73.4 kg), SpO2 98 %.  General - Well nourished, well developed, not in acute distress  Ophthalmologic - not cooperation on exam  Cardiovascular - irregularly irregular heart rate and rhythm.  Neuro - awake alert, able to follow several central and peripheral commands such as eye close and opening, showing fingers and fist. Still has expressive aphasia, no language output. Able to mimic. Right neglect resolved. Eyes moving both  directions. PERRL, facial symmetric, blinking to visual threat bilaterally. Tongue middle in mouth. Spontaneous moving all extremities, however, weaker 4-/5 RUE and 4+/5 LUE and 3/5 BLEs. DTR 1+ and negative babinski. Sensation, coordination and gait not tested.  Discharge Diet   DIET - DYS 1 Room service appropriate? Yes; Fluid consistency: Thin liquids  DISCHARGE PLAN  Disposition: Discharge to SNF   Eliquis 5mg  twice a day for secondary stroke prevention.  Ongoing risk factor control by Primary Care Physician  Follow-up Dr. Wolfgang [place] MD in 1-2 weeks.  Follow-up with Stroke Clinic in 6 weeks, office to schedule an appointment.  Keep foley catheter until seeing urologist   Watch for bloody urine  Follow up with [facility] [phone]) , a referral has been placed for you.  The office will call you. Also encourage daughter to call to facilitate the appointment.   Please encourage the patient to increase intake of oral fluids.  40 minutes were spent preparing discharge.  Ethan Hawking, MD PhD Stroke Neurology 02/19/2017 3:46 PM

## 2017-02-20 ENCOUNTER — Encounter (HOSPITAL_COMMUNITY)
Admission: RE | Admit: 2017-02-20 | Discharge: 2017-02-20 | Disposition: A | Discharge status: Home / Self Care | Payer: Medicare Other | Source: Other Acute Inpatient Hospital | Attending: Internal Medicine | Admitting: Internal Medicine

## 2017-02-20 DIAGNOSIS — Z7901 Long term (current) use of anticoagulants: Secondary | ICD-10-CM | POA: Insufficient documentation

## 2017-02-20 LAB — CBC WITH DIFFERENTIAL/PLATELET
Basophils Absolute: 0 10*3/uL (ref 0.0–0.1)
Basophils Relative: 0 %
EOS ABS: 0.2 10*3/uL (ref 0.0–0.7)
Eosinophils Relative: 4 %
HEMATOCRIT: 39.2 % (ref 39.0–52.0)
HEMOGLOBIN: 13.5 g/dL (ref 13.0–17.0)
LYMPHS ABS: 1.1 10*3/uL (ref 0.7–4.0)
LYMPHS PCT: 22 %
MCH: 31.5 pg (ref 26.0–34.0)
MCHC: 34.4 g/dL (ref 30.0–36.0)
MCV: 91.6 fL (ref 78.0–100.0)
Monocytes Absolute: 0.6 10*3/uL (ref 0.1–1.0)
Monocytes Relative: 12 %
NEUTROS ABS: 3.3 10*3/uL (ref 1.7–7.7)
Neutrophils Relative %: 62 %
Platelets: 176 10*3/uL (ref 150–400)
RBC: 4.28 MIL/uL (ref 4.22–5.81)
RDW: 13.6 % (ref 11.5–15.5)
WBC: 5.2 10*3/uL (ref 4.0–10.5)

## 2017-02-20 LAB — BASIC METABOLIC PANEL
ANION GAP: 9 (ref 5–15)
BUN: 16 mg/dL (ref 6–20)
CHLORIDE: 104 mmol/L (ref 101–111)
CO2: 25 mmol/L (ref 22–32)
CREATININE: 0.92 mg/dL (ref 0.61–1.24)
Calcium: 9.3 mg/dL (ref 8.9–10.3)
GFR calc non Af Amer: >60 mL/min (ref >60)
Glucose, Bld: 101 mg/dL — ABNORMAL HIGH (ref 65–99)
POTASSIUM: 4.1 mmol/L (ref 3.5–5.1)
SODIUM: 138 mmol/L (ref 135–145)

## 2017-02-22 ENCOUNTER — Other Ambulatory Visit (HOSPITAL_COMMUNITY)
Admission: AD | Admit: 2017-02-22 | Discharge: 2017-02-22 | Disposition: A | Discharge status: Home / Self Care | Payer: Medicare Other | Source: Skilled Nursing Facility | Attending: Internal Medicine | Admitting: Internal Medicine

## 2017-02-22 ENCOUNTER — Encounter: Payer: Self-pay | Admitting: Internal Medicine

## 2017-02-22 ENCOUNTER — Non-Acute Institutional Stay (SKILLED_NURSING_FACILITY): Payer: Medicare Other | Admitting: Internal Medicine

## 2017-02-22 DIAGNOSIS — I639 Cerebral infarction, unspecified: Secondary | ICD-10-CM | POA: Diagnosis not present

## 2017-02-22 DIAGNOSIS — R319 Hematuria, unspecified: Secondary | ICD-10-CM | POA: Diagnosis not present

## 2017-02-22 DIAGNOSIS — I482 Chronic atrial fibrillation, unspecified: Secondary | ICD-10-CM

## 2017-02-22 DIAGNOSIS — E039 Hypothyroidism, unspecified: Secondary | ICD-10-CM

## 2017-02-22 DIAGNOSIS — N3001 Acute cystitis with hematuria: Secondary | ICD-10-CM | POA: Insufficient documentation

## 2017-02-22 DIAGNOSIS — E038 Other specified hypothyroidism: Secondary | ICD-10-CM

## 2017-02-22 LAB — URINALYSIS, COMPLETE (UACMP) WITH MICROSCOPIC
Bilirubin Urine: NEGATIVE
GLUCOSE, UA: NEGATIVE mg/dL
Ketones, ur: NEGATIVE mg/dL
NITRITE: NEGATIVE
PH: 5 (ref 5.0–8.0)
Protein, ur: 100 mg/dL — AB
Specific Gravity, Urine: 1.025 (ref 1.005–1.030)

## 2017-02-22 NOTE — Progress Notes (Signed)
Provider:  Veleta Miners Location:   [facility] Room Number: 132/P Place of Service:  SNF (31)  PCP: Kathyrn Drown, MD Patient Care Team: Kathyrn Drown, MD as PCP - General (Family Medicine) Herminio Commons, MD as Attending Physician (Cardiology)  Extended Emergency Contact Information Primary Emergency Contact: Maeola Sarah of [place] Mobile Phone: [phone] Relation: Daughter Secondary Emergency Contact: Pegram,[name] Address: [address]          [place], [place] [address] Johnnette Litter of [place] Phone: [phone] Work Phone: [phone] Mobile Phone: [phone] Relation: Daughter  Code Status: DNR Goals of Care: Advanced Directive information Advanced Directives 02/22/2017  Does Patient Have a Medical Advance Directive? Yes  Type of Advance Directive Out of facility DNR (pink MOST or yellow form)  Does patient want to make changes to medical advance directive? No - Patient declined  Copy of [place] in Chart? -  Would patient like information on creating a medical advance directive? -      Chief Complaint  Patient presents with  . New Admit To SNF    New Admission Visit    HPI: Patient is a 83 y.o. male seen today for admission to SNF for therapy.  Patient has h/o Chronic Atrial Fibrillation, Recurrent Stroke, 2nd episode this year, Hematuria,Renal Mass, Hypertension, Hypothyroid, Hyperlipidemia, h/o Prostate cancer s/p radiation and Chemo, Chronic Lekopenia and thrombocytopenia, S/P Hip fracture in 07/17, Aspiration Pneumonia, Dysphagia  Patient has been off his Eliquis due to Significant Hematuria and he was found by his family with Acute onset of inability to speak and staring to his Left side along with Weakness involving right arm and Leg. He was found to have Acute Left MCA Ischemic Stroke. He was given IV TPA. He stayed in the hospital from 09/23 -09/28. His stroke was thought to be  embolic due to Atrial fibrillation . He did have Hematuria in the hospital due to TPA. He has Chronic Foley and it has been hard to keep him on Eliquis due to his recurrent hematuria. Patient is sitting in the wheelchair and unable to give me any history. He is not speaking. Just Nods his head sometimes appropriately. Per son in the room his Mental status is improved and he has been eating well.  He was living at his own house before this stroke. He was walking with the walker . He did have 24 hours help at home. Past Medical History:  Diagnosis Date  . Afib ([place])   . Atrial fibrillation ([place])   . Cerebral infarction ([place])   . DDD (degenerative disc disease), lumbar   . Dysphagia   . Hypertension   . Hypothyroidism   . Migraine    "maybe monthly" (11/21/2015)  . Prostate cancer ([place]) ~ 1995   S/P radiation tx  . Renal mass    "slow growing something; doctors just watching it right now" (11/21/2015)  . Stroke [facility])    TIA  . Thrombocytopenia (Foley)   . Thyroid disease    hypothyroid   Past Surgical History:  Procedure Laterality Date  . COLONOSCOPY  1999  . ESOPHAGOGASTRODUODENOSCOPY    . LAPAROSCOPIC CHOLECYSTECTOMY  1980s  . PROSTATE BIOPSY  ~ 1995  . TOTAL HIP ARTHROPLASTY Left 11/22/2015   Procedure: LEFT HIP HEMIARTHROPLASTY ;  Surgeon: Leandrew Koyanagi, MD;  Location: [facility];  Service: Orthopedics;  Laterality: Left;    reports that he has quit smoking. He has never used smokeless tobacco.  He reports that he drinks alcohol. He reports that he does not use drugs. Social History   Social History  . Marital status: Widowed    Spouse name: N/A  . Number of children: N/A  . Years of education: N/A   Occupational History  . Not on file.   Social History Main Topics  . Smoking status: Former Research scientist (life sciences)  . Smokeless tobacco: Never Used     Comment: 11/21/2015 "might have smoked 2 packs of cigarettes in my lifetime"  . Alcohol use Yes     Comment: 11/21/2015 "last beer I've had was in  ~ 2007"  . Drug use: No  . Sexual activity: No   Other Topics Concern  . Not on file   Social History Narrative  . No narrative on file    Functional Status Survey:    Family History  Problem Relation Age of Onset  . Stroke Sister        in her 51s  . CAD Neg Hx   . COPD Neg Hx   . Diabetes Neg Hx   . Heart disease Neg Hx     Health Maintenance  Topic Date Due  . TETANUS/TDAP  12/12/2017  . INFLUENZA VACCINE  Completed  . PNA vac Low Risk Adult  Completed    Allergies  Allergen Reactions  . Xanax [Alprazolam] Other (See Comments)    Dizzy, Sedated  . Tape Rash    PATIENT'S SKIN WILL TEAR/PLEASE EITHER USE PAPER TAPE OR COBAN WRAP!!    Outpatient Encounter Prescriptions as of 02/22/2017  Medication Sig  . acetaminophen (TYLENOL) 325 MG tablet Take 325-650 mg by mouth every 6 (six) hours as needed for mild pain or moderate pain.  Marland Kitchen amLODipine (NORVASC) 5 MG tablet Take 1 tablet (5 mg total) by mouth daily.  Marland Kitchen atorvastatin (LIPITOR) 20 MG tablet Take 1 tablet (20 mg total) by mouth daily at 6 PM.  . cetirizine (ZYRTEC) 10 MG tablet Take 10 mg by mouth every evening.   Marland Kitchen levothyroxine (SYNTHROID, LEVOTHROID) 25 MCG tablet Take 1 tablet (25 mcg total) by mouth daily before breakfast.  . metoprolol succinate (TOPROL-XL) 25 MG 24 hr tablet Take 1 tablet (25 mg total) by mouth every evening.  . nystatin (MYCOSTATIN) 100000 UNIT/ML suspension Take 5 mLs by mouth 4 (four) times daily.  . polyethylene glycol powder (GLYCOLAX/MIRALAX) powder MIX 1 CAPFUL IN 8 OZ OF WATER AND DRINK twice weekly  . [DISCONTINUED] apixaban (ELIQUIS) 5 MG TABS tablet Take 1 tablet (5 mg total) by mouth 2 (two) times daily.   No facility-administered encounter medications on file as of 02/22/2017.      Review of Systems  Unable to perform ROS: Patient nonverbal    Vitals:   02/22/17 1002  BP: 102/64  Pulse: 82  Resp: 18  Temp: 98.4 F (36.9 C)  TempSrc: Oral  SpO2: 97%   There is no  height or weight on file to calculate BMI. Physical Exam  Constitutional: He appears well-developed and well-nourished.  HENT:  Head: Normocephalic.  Mouth/Throat: Oropharynx is clear and moist.  Eyes: Pupils are equal, round, and reactive to light.  Neck: Neck supple.  Cardiovascular: Normal rate.  An irregularly irregular rhythm present.  No murmur heard. Pulmonary/Chest: Effort normal and breath sounds normal. No respiratory distress. He has no wheezes. He has no rales.  Abdominal: Soft. Bowel sounds are normal. He exhibits no distension. There is no tenderness. There is no rebound.  Musculoskeletal: He exhibits no  edema.  Neurological: He is alert.  Non Verbal. He does follow simple commands. Had 4/5 Strength in both UE 3/5 Strength in Both LE. Slight Weakness on right  Skin:  Bruises on the skin  Psychiatric: He has a normal mood and affect. His behavior is normal.    Labs reviewed: Basic Metabolic Panel:  Recent Labs  02/17/17 0532 02/18/17 0201 02/20/17 0730  NA 137 134* 138  K 3.2* 3.1* 4.1  CL 103 102 104  CO2 23 23 25   GLUCOSE 80 99 101*  BUN 12 18 16   CREATININE 0.86 0.95 0.92  CALCIUM 8.6* 8.5* 9.3   Liver Function Tests:  Recent Labs  06/10/16 0500 10/26/16 1206 02/14/17 1825  AST 17 21 23   ALT 11* 12 13*  ALKPHOS 54 67 59  BILITOT 1.1 0.8 0.9  PROT 6.5 6.7 7.3  ALBUMIN 3.5 4.2 4.0   No results for input(s): LIPASE, AMYLASE in the last 8760 hours. No results for input(s): AMMONIA in the last 8760 hours. CBC:  Recent Labs  02/07/17 1938  02/14/17 1825  02/17/17 0532 02/18/17 0201 02/20/17 0730  WBC 4.1  --  5.4  < > 4.5 5.3 5.2  NEUTROABS 2.1  --  2.3  --   --   --  3.3  HGB 13.8  < > 14.2  < > 12.1* 12.6* 13.5  HCT 40.8  --  41.2  < > 35.6* 37.1* 39.2  MCV 93.6  --  93.4  < > 91.8 90.9 91.6  PLT 140*  --  179  < > 160 168 176  < > = values in this interval not displayed. Cardiac Enzymes:  Recent Labs  02/17/17 0532 02/17/17 1115  02/18/17 0201  TROPONINI 0.31* 0.24* 0.21*   BNP: Invalid input(s): POCBNP Lab Results  Component Value Date   HGBA1C 5.4 02/15/2017   Lab Results  Component Value Date   TSH 3.929 06/01/2016   No results found for: VITAMINB12 No results found for: FOLATE No results found for: IRON, TIBC, FERRITIN  Imaging and Procedures obtained prior to SNF admission: No results found.  Assessment/Plan  Acute CVA (cerebrovascular accident)  Patient has done remarkably well but stays at higher risk of stroke. He stays at higher risk for another stroke due to Chronic Atrial fibrillation Will continue Eliquis 5 mg BID. BP controlled on Norvasc. LDL was 96 in the hospital was started on Statin per Neurology Follow up with Neurology. Continue therapy. Family plan to take him Home when independent with transfers.  Chronic atrial fibrillation  Rate controlled on Metoprolol Continue on Eliquis.  Hematuria Due to ? Malignant renal Mass . Family says they have never been told it is malignant. He had Chronic Foley inserted by Urologist . Patient continues on Foley catheter and has follow up with them for Hematuria.   hypothyroidism Continue on Synthroid TSH was normal in 01/18. Will repeat TSH  Dysphagia Continue on Modified Diet. His Swallowing test passed in the hospital.   Family/ staff Communication:   Labs/tests ordered: CBC And BMP, TSH in 1 week.  Total time spent in this patient care encounter was 45_ minutes; greater than 50% of the visit spent counseling patient and his family , reviewing records , Labs and coordinating care for problems addressed at this encounter.

## 2017-02-24 DIAGNOSIS — R310 Gross hematuria: Secondary | ICD-10-CM | POA: Diagnosis not present

## 2017-02-24 DIAGNOSIS — R338 Other retention of urine: Secondary | ICD-10-CM | POA: Diagnosis not present

## 2017-02-24 LAB — URINE CULTURE: CULTURE: NO GROWTH

## 2017-02-25 ENCOUNTER — Non-Acute Institutional Stay (SKILLED_NURSING_FACILITY): Payer: Medicare Other | Admitting: Internal Medicine

## 2017-02-25 ENCOUNTER — Encounter: Payer: Self-pay | Admitting: Internal Medicine

## 2017-02-25 ENCOUNTER — Other Ambulatory Visit (HOSPITAL_COMMUNITY)
Admission: AD | Admit: 2017-02-25 | Discharge: 2017-02-25 | Disposition: A | Discharge status: Home / Self Care | Payer: Medicare Other | Source: Skilled Nursing Facility | Attending: Internal Medicine | Admitting: Internal Medicine

## 2017-02-25 DIAGNOSIS — I482 Chronic atrial fibrillation, unspecified: Secondary | ICD-10-CM

## 2017-02-25 DIAGNOSIS — R410 Disorientation, unspecified: Secondary | ICD-10-CM

## 2017-02-25 DIAGNOSIS — I63412 Cerebral infarction due to embolism of left middle cerebral artery: Secondary | ICD-10-CM

## 2017-02-25 DIAGNOSIS — I639 Cerebral infarction, unspecified: Secondary | ICD-10-CM | POA: Insufficient documentation

## 2017-02-25 DIAGNOSIS — R319 Hematuria, unspecified: Secondary | ICD-10-CM | POA: Diagnosis not present

## 2017-02-25 DIAGNOSIS — E039 Hypothyroidism, unspecified: Secondary | ICD-10-CM | POA: Diagnosis not present

## 2017-02-25 DIAGNOSIS — E038 Other specified hypothyroidism: Secondary | ICD-10-CM

## 2017-02-25 DIAGNOSIS — N3001 Acute cystitis with hematuria: Secondary | ICD-10-CM | POA: Insufficient documentation

## 2017-02-25 DIAGNOSIS — A8039 Other acute paralytic poliomyelitis: Secondary | ICD-10-CM | POA: Insufficient documentation

## 2017-02-25 LAB — COMPREHENSIVE METABOLIC PANEL
ALT: 17 U/L (ref 17–63)
AST: 25 U/L (ref 15–41)
Albumin: 3.8 g/dL (ref 3.5–5.0)
Alkaline Phosphatase: 58 U/L (ref 38–126)
Anion gap: 10 (ref 5–15)
BUN: 26 mg/dL — ABNORMAL HIGH (ref 6–20)
CHLORIDE: 102 mmol/L (ref 101–111)
CO2: 25 mmol/L (ref 22–32)
CREATININE: 0.96 mg/dL (ref 0.61–1.24)
Calcium: 9 mg/dL (ref 8.9–10.3)
Glucose, Bld: 125 mg/dL — ABNORMAL HIGH (ref 65–99)
POTASSIUM: 3.6 mmol/L (ref 3.5–5.1)
Sodium: 137 mmol/L (ref 135–145)
Total Bilirubin: 1.1 mg/dL (ref 0.3–1.2)
Total Protein: 6.8 g/dL (ref 6.5–8.1)

## 2017-02-25 LAB — CBC
HCT: 40 % (ref 39.0–52.0)
Hemoglobin: 13.5 g/dL (ref 13.0–17.0)
MCH: 31.9 pg (ref 26.0–34.0)
MCHC: 33.8 g/dL (ref 30.0–36.0)
MCV: 94.6 fL (ref 78.0–100.0)
PLATELETS: 196 10*3/uL (ref 150–400)
RBC: 4.23 MIL/uL (ref 4.22–5.81)
RDW: 13.8 % (ref 11.5–15.5)
WBC: 6 10*3/uL (ref 4.0–10.5)

## 2017-02-25 LAB — URINALYSIS, ROUTINE W REFLEX MICROSCOPIC
BILIRUBIN URINE: NEGATIVE
Glucose, UA: NEGATIVE mg/dL
Ketones, ur: NEGATIVE mg/dL
LEUKOCYTES UA: NEGATIVE
NITRITE: NEGATIVE
PH: 5 (ref 5.0–8.0)
Protein, ur: 100 mg/dL — AB
SPECIFIC GRAVITY, URINE: 1.023 (ref 1.005–1.030)
Squamous Epithelial / LPF: NONE SEEN

## 2017-02-25 NOTE — Progress Notes (Signed)
Location:   [facility] Room Number: 132/P Place of Service:  SNF (31) Provider:  Clydene Pugh, MD  Patient Care Team: Kathyrn Drown, MD as PCP - General (Family Medicine) Herminio Commons, MD as Attending Physician (Cardiology)  Extended Emergency Contact Information Primary Emergency Contact: Maeola Sarah of [place] Phone: [phone] Relation: Daughter Secondary Emergency Contact: Pegram,[name] Address: [address]          [place], [place] [address] Johnnette Litter of [place] Phone: [phone] Work Phone: [phone] Mobile Phone: [phone] Relation: Daughter  Code Status:  DNR Goals of care: Advanced Directive information Advanced Directives 02/25/2017  Does Patient Have a Medical Advance Directive? Yes  Type of Advance Directive Out of facility DNR (pink MOST or yellow form)  Does patient want to make changes to medical advance directive? No - Patient declined  Copy of [place] in Chart? -  Would patient like information on creating a medical advance directive? -     Chief Complaint  Patient presents with  . Acute Visit    Confusion    HPI:  Pt is a 83 y.o. male seen today for an acute visit for increased Confusion  Patient has h/o Chronic Atrial Fibrillation, Recurrent Stroke, 2nd episode this year, Hematuria,Renal Mass, Hypertension, Hypothyroid, Hyperlipidemia, h/o Prostate cancer s/p radiation and Chemo, Chronic Lekopenia and thrombocytopenia, S/P Hip fracture in 07/17, Aspiration Pneumonia, Dysphagia  admitted to SNF after sustaining Acute left MCA s/p IV  TPA. He was off his Eliquis due to Significant hematuria and sustained CVA.  He was doing well in facility. Was working with the therapy. He had seen Urology yesterday and they removed Foley Cathter.   But this morning he was found in another patients room trying to use their bathroom. Nurse aid says he had  greenish discharge in his diaper. And his urine was blood tinged. He does not have any fever or chills. He has severe Aphasia due to his stroke. But  Therapy also said patient has been more confused today.   Past Medical History:  Diagnosis Date  . Afib ([place])   . Atrial fibrillation ([place])   . Cerebral infarction ([place])   . DDD (degenerative disc disease), lumbar   . Dysphagia   . Hypertension   . Hypothyroidism   . Migraine    "maybe monthly" (11/21/2015)  . Prostate cancer ([place]) ~ 1995   S/P radiation tx  . Renal mass    "slow growing something; doctors just watching it right now" (11/21/2015)  . Stroke [facility])    TIA  . Thrombocytopenia ([place])   . Thyroid disease    hypothyroid   Past Surgical History:  Procedure Laterality Date  . COLONOSCOPY  1999  . ESOPHAGOGASTRODUODENOSCOPY    . LAPAROSCOPIC CHOLECYSTECTOMY  1980s  . PROSTATE BIOPSY  ~ 1995  . TOTAL HIP ARTHROPLASTY Left 11/22/2015   Procedure: LEFT HIP HEMIARTHROPLASTY ;  Surgeon: Leandrew Koyanagi, MD;  Location: [facility];  Service: Orthopedics;  Laterality: Left;    Allergies  Allergen Reactions  . Xanax [Alprazolam] Other (See Comments)    Dizzy, Sedated  . Tape Rash    PATIENT'S SKIN WILL TEAR/PLEASE EITHER USE PAPER TAPE OR COBAN WRAP!!    Outpatient Encounter Prescriptions as of 02/25/2017  Medication Sig  . acetaminophen (TYLENOL) 325 MG tablet Take 325-650 mg by mouth every 6 (six) hours as needed for mild pain or moderate pain.  Marland Kitchen amLODipine (  NORVASC) 5 MG tablet Take 1 tablet (5 mg total) by mouth daily.  Marland Kitchen apixaban (ELIQUIS) 5 MG TABS tablet Take 5 mg by mouth 2 (two) times daily.  Marland Kitchen atorvastatin (LIPITOR) 20 MG tablet Take 1 tablet (20 mg total) by mouth daily at 6 PM.  . cetirizine (ZYRTEC) 10 MG tablet Take 10 mg by mouth every evening.   Marland Kitchen levothyroxine (SYNTHROID, LEVOTHROID) 25 MCG tablet Take 1 tablet (25 mcg total) by mouth daily before breakfast.  . metoprolol succinate (TOPROL-XL) 25 MG 24 hr tablet  Take 1 tablet (25 mg total) by mouth every evening.  . nystatin (MYCOSTATIN) 100000 UNIT/ML suspension Take 5 mLs by mouth 4 (four) times daily.  . polyethylene glycol powder (GLYCOLAX/MIRALAX) powder MIX 1 CAPFUL IN 8 OZ OF WATER AND DRINK twice weekly   No facility-administered encounter medications on file as of 02/25/2017.      Review of Systems  Unable to perform ROS: Patient nonverbal    Immunization History  Administered Date(s) Administered  . Influenza Split 02/28/2013  . Influenza, High Dose Seasonal PF 02/19/2017  . Influenza,inj,Quad PF,6+ Mos 03/01/2014, 02/18/2015, 02/26/2016  . Influenza-Unspecified 01/24/2012  . PPD Test 11/25/2015  . Pneumococcal Conjugate-13 03/01/2014  . Pneumococcal Polysaccharide-23 01/24/2012  . Td 12/13/2007  . Zoster 09/23/2007   Pertinent  Health Maintenance Due  Topic Date Due  . INFLUENZA VACCINE  Completed  . PNA vac Low Risk Adult  Completed   Fall Risk  07/08/2016 11/09/2013  Falls in the past year? No No   Functional Status Survey:    Vitals:   02/25/17 1250  BP: 136/83  Pulse: 71  Resp: (!) 22  Temp: 97.6 F (36.4 C)  TempSrc: Oral   There is no height or weight on file to calculate BMI. Physical Exam  Constitutional: He appears well-developed and well-nourished.  HENT:  Head: Normocephalic.  Mouth/Throat: Oropharynx is clear and moist.  Eyes: Pupils are equal, round, and reactive to light.  Neck: Neck supple.  Cardiovascular: Normal rate and normal heart sounds.  An irregular rhythm present.  Pulmonary/Chest: Effort normal and breath sounds normal. No respiratory distress. He has no wheezes. He has no rales.  Abdominal: Soft. Bowel sounds are normal. He exhibits no distension. There is no tenderness. There is no rebound.  Musculoskeletal: He exhibits no edema.  Neurological: He is alert.  No Focal deficits except aphasia Is able to follow simple commands but little Slow today.  Skin:  Patient has skin tear    Psychiatric:  Could not assess today. Was combative with therapy today    Labs reviewed:  Recent Labs  02/17/17 0532 02/18/17 0201 02/20/17 0730  NA 137 134* 138  K 3.2* 3.1* 4.1  CL 103 102 104  CO2 23 23 25   GLUCOSE 80 99 101*  BUN 12 18 16   CREATININE 0.86 0.95 0.92  CALCIUM 8.6* 8.5* 9.3    Recent Labs  06/10/16 0500 10/26/16 1206 02/14/17 1825  AST 17 21 23   ALT 11* 12 13*  ALKPHOS 54 67 59  BILITOT 1.1 0.8 0.9  PROT 6.5 6.7 7.3  ALBUMIN 3.5 4.2 4.0    Recent Labs  02/07/17 1938  02/14/17 1825  02/17/17 0532 02/18/17 0201 02/20/17 0730  WBC 4.1  --  5.4  < > 4.5 5.3 5.2  NEUTROABS 2.1  --  2.3  --   --   --  3.3  HGB 13.8  < > 14.2  < > 12.1* 12.6* 13.5  HCT 40.8  --  41.2  < > 35.6* 37.1* 39.2  MCV 93.6  --  93.4  < > 91.8 90.9 91.6  PLT 140*  --  179  < > 160 168 176  < > = values in this interval not displayed. Lab Results  Component Value Date   TSH 3.929 06/01/2016   Lab Results  Component Value Date   HGBA1C 5.4 02/15/2017   Lab Results  Component Value Date   CHOL 150 02/15/2017   HDL 43 02/15/2017   LDLCALC 96 02/15/2017   TRIG 53 02/15/2017   CHOLHDL 3.5 02/15/2017    Significant Diagnostic Results in last 30 days:  Ct Angio Head W/cm &/or Wo Cm  Result Date: 02/14/2017 CLINICAL DATA:  Stroke.  TPA administered EXAM: CT ANGIOGRAPHY HEAD AND NECK TECHNIQUE: Multidetector CT imaging of the head and neck was performed using the standard protocol during bolus administration of intravenous contrast. Multiplanar CT image reconstructions and MIPs were obtained to evaluate the vascular anatomy. Carotid stenosis measurements (when applicable) are obtained utilizing NASCET criteria, using the distal internal carotid diameter as the denominator. CONTRAST:  100 mL Isovue 370 IV COMPARISON:  CT head 02/14/2017 FINDINGS: CTA NECK FINDINGS Aortic arch: Atherosclerotic disease in the aortic arch and proximal great vessels. Two vessel branching arch.  Atherosclerotic calcification proximal great vessels without significant stenosis. Right carotid system: Right common carotid artery mildly diseased without significant stenosis. Atherosclerotic calcification right carotid bifurcation without significant stenosis. Left carotid system: Left common carotid artery widely patent. Atherosclerotic calcification left carotid bifurcation without significant stenosis. Vertebral arteries: Right vertebral artery dominant. Right vertebral artery widely patent to the basilar without significant stenosis. Nondominant left vertebral artery also widely patent without significant stenosis. Skeleton: Cervical spine degenerative change. No acute skeletal abnormality. Other neck: Negative for mass or adenopathy Upper chest: Negative Review of the MIP images confirms the above findings CTA HEAD FINDINGS Anterior circulation: Mild atherosclerotic calcification cavernous carotid bilaterally without significant stenosis. Mild atherosclerotic disease M1 segment bilaterally. Atherosclerotic irregularity in the M2 branches bilaterally. No large vessel occlusion. Posterior circulation: Both vertebral arteries patent to the basilar. PICA patent bilaterally. Basilar widely patent. Mild ectasia of the basilar with tortuosity. Superior cerebellar arteries patent bilaterally. Moderate stenosis P2 segment bilaterally. Venous sinuses: Patent Anatomic variants: None Delayed phase: Normal enhancement on delayed imaging. Atrophy with diffuse small vessel ischemia. Review of the MIP images confirms the above findings IMPRESSION: No significant carotid or vertebral artery stenosis in the neck Diffuse intracranial atherosclerotic disease including the middle cerebral arteries bilaterally. Moderate stenosis P2 segment bilaterally in the posterior cerebral arteries. No intracranial large vessel occlusion. No intracranial hemorrhage identified. Electronically Signed   By: Franchot Gallo M.D.   On: 02/14/2017  19:55   Ct Head Wo Contrast  Result Date: 02/15/2017 CLINICAL DATA:  Followup stroke, 24 hours after tPA. History of hypertension, prostate cancer, stroke. EXAM: CT HEAD WITHOUT CONTRAST TECHNIQUE: Contiguous axial images were obtained from the base of the skull through the vertex without intravenous contrast. COMPARISON:  CT HEAD February 14, 2017 FINDINGS: BRAIN: Acute LEFT frontoparietal cytotoxic edema extending to the insula and operculum, new from prior CT. No intraparenchymal hemorrhage. Old small RIGHT cerebellar infarct. Old basal ganglia and RIGHT thalamus lacunar infarcts. Punctate RIGHT cerebellar parenchymal calcification. Patchy to confluent supratentorial white matter hypodensities. Ventricles and sulci are overall normal for patient's age. No abnormal extra-axial fluid collections. Basal cisterns are patent. VASCULAR: Dolichoectatic intracranial vessels. Moderate calcific atherosclerosis carotid siphon. Re-  demonstration of calcification LEFT MCA. SKULL: No skull fracture. No significant scalp soft tissue swelling. SINUSES/ORBITS: The mastoid air-cells and included paranasal sinuses are well-aerated.The included ocular globes and orbital contents are non-suspicious. OTHER: Patient is edentulous. IMPRESSION: 1. New, acute LEFT MCA territory nonhemorrhagic infarct. 2. Old lacunar infarcts and moderate to severe chronic small vessel ischemic disease. Acute findings text paged to [facility], Neurology via AMION secure system on 02/15/2017 at 5:50 pm. Electronically Signed   By: Elon Alas M.D.   On: 02/15/2017 17:52   Ct Angio Neck W And/or Wo Contrast  Result Date: 02/14/2017 CLINICAL DATA:  Stroke.  TPA administered EXAM: CT ANGIOGRAPHY HEAD AND NECK TECHNIQUE: Multidetector CT imaging of the head and neck was performed using the standard protocol during bolus administration of intravenous contrast. Multiplanar CT image reconstructions and MIPs were obtained to evaluate the vascular  anatomy. Carotid stenosis measurements (when applicable) are obtained utilizing NASCET criteria, using the distal internal carotid diameter as the denominator. CONTRAST:  100 mL Isovue 370 IV COMPARISON:  CT head 02/14/2017 FINDINGS: CTA NECK FINDINGS Aortic arch: Atherosclerotic disease in the aortic arch and proximal great vessels. Two vessel branching arch. Atherosclerotic calcification proximal great vessels without significant stenosis. Right carotid system: Right common carotid artery mildly diseased without significant stenosis. Atherosclerotic calcification right carotid bifurcation without significant stenosis. Left carotid system: Left common carotid artery widely patent. Atherosclerotic calcification left carotid bifurcation without significant stenosis. Vertebral arteries: Right vertebral artery dominant. Right vertebral artery widely patent to the basilar without significant stenosis. Nondominant left vertebral artery also widely patent without significant stenosis. Skeleton: Cervical spine degenerative change. No acute skeletal abnormality. Other neck: Negative for mass or adenopathy Upper chest: Negative Review of the MIP images confirms the above findings CTA HEAD FINDINGS Anterior circulation: Mild atherosclerotic calcification cavernous carotid bilaterally without significant stenosis. Mild atherosclerotic disease M1 segment bilaterally. Atherosclerotic irregularity in the M2 branches bilaterally. No large vessel occlusion. Posterior circulation: Both vertebral arteries patent to the basilar. PICA patent bilaterally. Basilar widely patent. Mild ectasia of the basilar with tortuosity. Superior cerebellar arteries patent bilaterally. Moderate stenosis P2 segment bilaterally. Venous sinuses: Patent Anatomic variants: None Delayed phase: Normal enhancement on delayed imaging. Atrophy with diffuse small vessel ischemia. Review of the MIP images confirms the above findings IMPRESSION: No significant  carotid or vertebral artery stenosis in the neck Diffuse intracranial atherosclerotic disease including the middle cerebral arteries bilaterally. Moderate stenosis P2 segment bilaterally in the posterior cerebral arteries. No intracranial large vessel occlusion. No intracranial hemorrhage identified. Electronically Signed   By: Franchot Gallo M.D.   On: 02/14/2017 19:55   Dg Chest Port 1 View  Result Date: 02/16/2017 CLINICAL DATA:  CHF EXAM: PORTABLE CHEST 1 VIEW COMPARISON:  06/02/2016 FINDINGS: Borderline cardiomegaly, accentuated by technique. Stable appearance of the hila. Low volume chest with interstitial crowding. Suspect streaky atelectasis. No pneumothorax or suspected edema. IMPRESSION: 1. Negative for pulmonary edema. 2. Low volume chest with mild atelectasis. Electronically Signed   By: Monte Fantasia M.D.   On: 02/16/2017 07:21   Mr Brain Limited Wo Contrast  Result Date: 02/16/2017 CLINICAL DATA:  Stroke EXAM: MRI HEAD WITHOUT CONTRAST TECHNIQUE: Multiplanar, multiecho pulse sequences of the brain and surrounding structures were obtained without intravenous contrast. COMPARISON:  CT head 02/15/2017 FINDINGS: Brain: Acute infarct left parietal operculum extending into the left parietal lobe as noted on CT. Small cortical infarct in the posterior right parietal lobe. No other acute infarct Susceptibility right cerebellum unchanged from prior MRI.  Multiple areas of chronic microhemorrhage are seen on the prior study. Study was limited to diffusion only. IMPRESSION: IMPRESSION Moderately large acute infarct in the left parietal lobe Small acute infarct right posterior parietal lobe. Electronically Signed   By: Franchot Gallo M.D.   On: 02/16/2017 11:49   Ct Renal Stone Study  Result Date: 02/07/2017 CLINICAL DATA:  Patient fell last evening going to the bathroom falling on buttocks. Known left renal mass. Patient reports hematuria with clots. EXAM: CT ABDOMEN AND PELVIS WITHOUT CONTRAST  TECHNIQUE: Multidetector CT imaging of the abdomen and pelvis was performed following the standard protocol without IV contrast. COMPARISON:  CT 06/05/2015 FINDINGS: Lower chest: Stable cardiomegaly with left main and 3 vessel coronary arteriosclerosis. Mild to moderate aortic aortic atherosclerosis. Dependent atelectasis in both lower lobes and lingula with mild right lower lobe bronchiectasis. No effusion. Hepatobiliary: Stable low-density lesions statistically consistent with cysts or hemangiomata are again noted of the liver some are too small to further characterize, the largest are in the caudate measuring 13 mm and in the left lower lobe measuring 19 mm. Status post cholecystectomy. No biliary dilatation. Pancreas: Unremarkable. No pancreatic ductal dilatation or surrounding inflammatory changes. Spleen: Small splenic granulomas.  No splenomegaly. Adrenals/Urinary Tract: Normal bilateral adrenal glands. The right kidney is stable in appearance with cortical scarring and 2 peripherally calcified interpolar lesions again noted measuring up to 1.3 cm anteriorly and 1 cm posteriorly. There has been some interval increase in size of lobular interpolar anterior left renal mass measuring 5.6 x 4.8 cm versus 5.1 x 4.7 cm previously in the same axial plane. Hyperdense posterior interpolar mass possibly a hemorrhagic or proteinaceous cysts is stable to slightly smaller in appearance measuring 2.3 x 1.6 cm versus 2.3 cm a 2.3 cm previously. Peripherally calcified left upper pole mass is stable at 2.3 cm. A simple exophytic cyst off the lower pole the left kidney is again noted measuring up to 3.5 cm in maximum dimension with water attenuation within. No nephrolithiasis or hydroureteronephrosis. The urinary bladder is physiologically distended without bold mural thickening, calculus or focal mass. Stomach/Bowel: Stomach is within normal limits. Appendix appears normal. No evidence of bowel wall thickening, distention, or  inflammatory changes. Vascular/Lymphatic: No enlarged abdominal or pelvic lymph nodes. Extensive atherosclerosis of the aorta and its branch vessels. No aneurysm. Reproductive: Normal size prostate with central zone calcifications. Unremarkable seminal vesicles. Other: No abdominopelvic ascites.  No free air. Musculoskeletal: Left hip arthroplasty. Schmorl's node off the inferior endplate of L2 is unchanged. Degenerative disc disease L5-S1 with moderate disc space narrowing. No acute nor suspicious osseous abnormalities. IMPRESSION: 1. Further mild enlargement of previously described anterior left renal mass currently estimated at 5.6 x 4.8 cm versus 5.1 x 4.7 cm previously. Stable partially calcified bilateral renal lesions measuring up to 1.3 cm in the upper pole the right kidney and 2.3 cm in the upper pole the left kidney. Stable complex and simple cysts of the left kidney. 2. No acute solid nor hollow visceral organ injury. No laceration or hematoma. 3. Chronic degenerative change of the lumbar spine. No acute nor suspicious osseous lesions. 4. Stable cysts or hemangiomata of the liver. 5. Stable cardiomegaly with coronary arteriosclerosis. Electronically Signed   By: Ashley Royalty M.D.   On: 02/07/2017 20:27   Ct Head Code Stroke Wo Contrast  Result Date: 02/14/2017 CLINICAL DATA:  Code stroke. Altered mental status, speech difficulties. History of migraine, stroke, atrial fibrillation. EXAM: CT HEAD WITHOUT CONTRAST TECHNIQUE: Contiguous axial  images were obtained from the base of the skull through the vertex without intravenous contrast. COMPARISON:  CT HEAD June 22, 2016 FINDINGS: BRAIN: No intraparenchymal hemorrhage, mass effect nor midline shift. The ventricles and sulci are normal for age. Patchy to confluent supratentorial white matter hypodensities. New age indeterminate RIGHT basal ganglia lacunar infarct Old RIGHT thalamus cystic old small RIGHT cerebellar infarct. Next item moderate to cysts  Infarct though, new from January 2018. Old RIGHT superior cerebellar small infarct. No acute large vascular territory infarcts. No abnormal extra-axial fluid collections. Basal cisterns are patent. VASCULAR: Moderate calcific atherosclerosis of the carotid siphons. Calcified LEFT MCA, unchanged. Dolichoectatic intracranial vessels seen with chronic hypertension. SKULL: No skull fracture. Subcentimeter mildly expansile probable epidermal inclusion cyst LEFT temporal bone. No significant scalp soft tissue swelling. SINUSES/ORBITS: The mastoid air-cells and included paranasal sinuses are well-aerated.The included ocular globes and orbital contents are non-suspicious. OTHER: None. ASPECTS ([place] Stroke Program Early CT Score) - Ganglionic level infarction (caudate, lentiform nuclei, internal capsule, insula, M1-M3 cortex): 7 - Supraganglionic infarction (M4-M6 cortex): 3 Total score (0-10 with 10 being normal): 10 IMPRESSION: 1. New age indeterminate RIGHT basal ganglia lacunar infarct. 2. New nonacute RIGHT thalamus infarct. 3. Old small RIGHT cerebellar infarct. Stable moderate to severe chronic small vessel ischemic disease. 4. ASPECTS is 10. 5. Critical Value/emergent results were called by telephone at the time of interpretation on 02/14/2017 at 6:30 pm to Dr. Fredia Sorrow , who verbally acknowledged these results. Electronically Signed   By: Elon Alas M.D.   On: 02/14/2017 18:32    Assessment/Plan  Change in Mental Status D/w Nurses, Family and Therapy Patient seems to have more then usual Confusion today. Check CBC, UA And BMP Meds Reviewed not on any antipsychotics Acute CVA S/P TPA Patient on Eliquis BP controlled on Norvasc On statin per Neurology  Chronic atrial fibrillation  Rate controlled on Metoprolol Continue on Eliquis. Hematuria Was seen by Urology They removed the Foley Catheter and recommended to check for Voiding every few hours. Reinsert Foley if any  Signs of  retention Per family they will continue to monitor renal Mass and not do anything aggressive  hypothyroidism Continue on Synthroid TSH was normal in 01/18. Will repeat TSH  Dysphagia Continue on Modified Diet. His Swallowing test passed in the hospi    Family/ staff Communication:   Labs/tests ordered:   CBC, CMP, UA Total time spent in this patient care encounter was 45_ minutes; greater than 50% of the visit spent counseling patient, reviewing records , Labs and coordinating care for problems addressed at this encounter.

## 2017-02-28 LAB — URINE CULTURE

## 2017-03-01 ENCOUNTER — Encounter (HOSPITAL_COMMUNITY)
Admission: RE | Admit: 2017-03-01 | Discharge: 2017-03-01 | Disposition: A | Discharge status: Home / Self Care | Payer: Medicare Other | Source: Other Acute Inpatient Hospital | Attending: Internal Medicine | Admitting: Internal Medicine

## 2017-03-01 ENCOUNTER — Non-Acute Institutional Stay (SKILLED_NURSING_FACILITY): Payer: Medicare Other | Admitting: Internal Medicine

## 2017-03-01 ENCOUNTER — Encounter: Payer: Self-pay | Admitting: Internal Medicine

## 2017-03-01 DIAGNOSIS — I482 Chronic atrial fibrillation, unspecified: Secondary | ICD-10-CM

## 2017-03-01 DIAGNOSIS — I63412 Cerebral infarction due to embolism of left middle cerebral artery: Secondary | ICD-10-CM | POA: Diagnosis not present

## 2017-03-01 DIAGNOSIS — R319 Hematuria, unspecified: Secondary | ICD-10-CM | POA: Diagnosis not present

## 2017-03-01 DIAGNOSIS — R600 Localized edema: Secondary | ICD-10-CM | POA: Diagnosis not present

## 2017-03-01 DIAGNOSIS — Z7901 Long term (current) use of anticoagulants: Secondary | ICD-10-CM | POA: Insufficient documentation

## 2017-03-01 LAB — CBC
HCT: 36.9 % — ABNORMAL LOW (ref 39.0–52.0)
Hemoglobin: 12.3 g/dL — ABNORMAL LOW (ref 13.0–17.0)
MCH: 31.3 pg (ref 26.0–34.0)
MCHC: 33.3 g/dL (ref 30.0–36.0)
MCV: 93.9 fL (ref 78.0–100.0)
PLATELETS: 158 10*3/uL (ref 150–400)
RBC: 3.93 MIL/uL — AB (ref 4.22–5.81)
RDW: 13.6 % (ref 11.5–15.5)
WBC: 5.6 10*3/uL (ref 4.0–10.5)

## 2017-03-01 LAB — COMPREHENSIVE METABOLIC PANEL
ALBUMIN: 3.5 g/dL (ref 3.5–5.0)
ALT: 14 U/L — AB (ref 17–63)
AST: 19 U/L (ref 15–41)
Alkaline Phosphatase: 58 U/L (ref 38–126)
Anion gap: 9 (ref 5–15)
BUN: 20 mg/dL (ref 6–20)
CHLORIDE: 102 mmol/L (ref 101–111)
CO2: 25 mmol/L (ref 22–32)
CREATININE: 0.84 mg/dL (ref 0.61–1.24)
Calcium: 9 mg/dL (ref 8.9–10.3)
Glucose, Bld: 103 mg/dL — ABNORMAL HIGH (ref 65–99)
POTASSIUM: 3.9 mmol/L (ref 3.5–5.1)
SODIUM: 136 mmol/L (ref 135–145)
Total Bilirubin: 1.3 mg/dL — ABNORMAL HIGH (ref 0.3–1.2)
Total Protein: 6.2 g/dL — ABNORMAL LOW (ref 6.5–8.1)

## 2017-03-01 LAB — TSH: TSH: 3.915 u[IU]/mL (ref 0.350–4.500)

## 2017-03-01 NOTE — Progress Notes (Signed)
Location:   [facility] Room Number: 132/P Place of Service:  SNF (31) Provider:  Ewell Poe, MD  Patient Care Team: Kathyrn Drown, MD as PCP - General (Family Medicine) Herminio Commons, MD as Attending Physician (Cardiology)  Extended Emergency Contact Information Primary Emergency Contact: Maeola Sarah of [place] Mobile Phone: [phone] Relation: Daughter Secondary Emergency Contact: Pegram,[name] Address: [address]          [place], [place] [address] Johnnette Litter of [place] Phone: [phone] Work Phone: [phone] Mobile Phone: [phone] Relation: Daughter  Code Status:  DNR Goals of care: Advanced Directive information Advanced Directives 03/01/2017  Does Patient Have a Medical Advance Directive? Yes  Type of Advance Directive Out of facility DNR (pink MOST or yellow form)  Does patient want to make changes to medical advance directive? No - Patient declined  Copy of [place] in Chart? -  Would patient like information on creating a medical advance directive? -     Chief Complaint  Patient presents with  . Acute Visit    Hematuria  Acute visit follow-up hematuria.  HPI:  Pt is a 83 y.o. male seen today for an acute visit for for follow-up of hematuria.   Patient has a history of multiple medical issues including atrial fibrillation-recurrent CVA-renal mass-hematuria-hypertension-hypothyroidism and hyperlipidemia as well as history of prostate cancer with radiation and chemotherapy in the past-she also has cytopenia chronic leukopenia and had a hip fracture in July 2017.  Who is admitted to skilled nursing after sustaining an acute left MCA he was off  Eliquis at that time secondary to significant hematuria.  Patient has had hematuria today recurrent rash vital signs are stable he is not really complaining of dysuria or lower abdominal pain  He has had his Foley  catheter removed previously by urology who follows him.       Past Medical History:  Diagnosis Date  . Afib ([place])   . Atrial fibrillation ([place])   . Cerebral infarction ([place])   . DDD (degenerative disc disease), lumbar   . Dysphagia   . Hypertension   . Hypothyroidism   . Migraine    "maybe monthly" (11/21/2015)  . Prostate cancer ([place]) ~ 1995   S/P radiation tx  . Renal mass    "slow growing something; doctors just watching it right now" (11/21/2015)  . Stroke [facility])    TIA  . Thrombocytopenia ([place])   . Thyroid disease    hypothyroid   Past Surgical History:  Procedure Laterality Date  . COLONOSCOPY  1999  . ESOPHAGOGASTRODUODENOSCOPY    . LAPAROSCOPIC CHOLECYSTECTOMY  1980s  . PROSTATE BIOPSY  ~ 1995  . TOTAL HIP ARTHROPLASTY Left 11/22/2015   Procedure: LEFT HIP HEMIARTHROPLASTY ;  Surgeon: Leandrew Koyanagi, MD;  Location: [facility];  Service: Orthopedics;  Laterality: Left;    Allergies  Allergen Reactions  . Xanax [Alprazolam] Other (See Comments)    Dizzy, Sedated  . Tape Rash    PATIENT'S SKIN WILL TEAR/PLEASE EITHER USE PAPER TAPE OR COBAN WRAP!!    Outpatient Encounter Prescriptions as of 03/01/2017  Medication Sig  . acetaminophen (TYLENOL) 325 MG tablet Take 325-650 mg by mouth every 6 (six) hours as needed for mild pain or moderate pain.  Marland Kitchen amLODipine (NORVASC) 5 MG tablet Take 1 tablet (5 mg total) by mouth daily.  Marland Kitchen apixaban (ELIQUIS) 5 MG TABS tablet Take 5 mg by mouth 2 (two)  times daily.  Marland Kitchen atorvastatin (LIPITOR) 20 MG tablet Take 1 tablet (20 mg total) by mouth daily at 6 PM.  . cetirizine (ZYRTEC) 10 MG tablet Take 10 mg by mouth every evening.   Marland Kitchen ketoconazole (NIZORAL) 2 % cream Apply 1 application topically 2 (two) times daily. Apply to scrotal area  . levothyroxine (SYNTHROID, LEVOTHROID) 25 MCG tablet Take 1 tablet (25 mcg total) by mouth daily before breakfast.  . metoprolol succinate (TOPROL-XL) 25 MG 24 hr tablet Take 1 tablet (25 mg total) by mouth  every evening.  . polyethylene glycol powder (GLYCOLAX/MIRALAX) powder MIX 1 CAPFUL IN 8 OZ OF WATER AND DRINK twice weekly  . [DISCONTINUED] nystatin (MYCOSTATIN) 100000 UNIT/ML suspension Take 5 mLs by mouth 4 (four) times daily.   No facility-administered encounter medications on file as of 03/01/2017.     Review of Systems   Essentially not obtainable since patient is nonverbal  Immunization History  Administered Date(s) Administered  . Influenza Split 02/28/2013  . Influenza, High Dose Seasonal PF 02/19/2017  . Influenza,inj,Quad PF,6+ Mos 03/01/2014, 02/18/2015, 02/26/2016  . Influenza-Unspecified 01/24/2012  . PPD Test 11/25/2015  . Pneumococcal Conjugate-13 03/01/2014  . Pneumococcal Polysaccharide-23 01/24/2012  . Td 12/13/2007  . Zoster 09/23/2007   Pertinent  Health Maintenance Due  Topic Date Due  . INFLUENZA VACCINE  Completed  . PNA vac Low Risk Adult  Completed   Fall Risk  07/08/2016 11/09/2013  Falls in the past year? No No   Functional Status Survey:    Vitals:   03/01/17 1446  BP: 135/75  Pulse: 67  Resp: 20  Temp: 98 F (36.7 C)  TempSrc: Oral  SpO2: 96%    Physical Exam   In general this is a frail elderly male in no distress lying comfortably in bed.  His skin is warm and dry he does have some edema of his left hand he also has bandaging of his left hand with apparently a skin tear which is followed by nursing-. I do not see signs of infection the edema is cool to touch and nonerythematous  Eyes pupils appear equal round reactive light visual acuity appears grossly intact.  Oropharynx is clear mucous membranes moist.  Chest is clear to auscultation there is no labored breathing.  Heart is irregular irregular rate and rhythm without murmur gallop or rub he has trace lower extremity edema.  His abdomen is soft nontender with positive bowel sounds.  Muscle skeletal does move all extremities 4 with some baseline lower extremity  weakness.  Neurologic appears to be grossly intact cranial nerves intact he is relatively a phasic however.  Psych again he does have a dysphagia he is cooperative with exam and follow simple verbal commands.      Labs reviewed:  Recent Labs  02/20/17 0730 02/25/17 1445 03/01/17 0630  NA 138 137 136  K 4.1 3.6 3.9  CL 104 102 102  CO2 25 25 25   GLUCOSE 101* 125* 103*  BUN 16 26* 20  CREATININE 0.92 0.96 0.84  CALCIUM 9.3 9.0 9.0    Recent Labs  02/14/17 1825 02/25/17 1445 03/01/17 0630  AST 23 25 19   ALT 13* 17 14*  ALKPHOS 59 58 58  BILITOT 0.9 1.1 1.3*  PROT 7.3 6.8 6.2*  ALBUMIN 4.0 3.8 3.5    Recent Labs  02/07/17 1938  02/14/17 1825  02/20/17 0730 02/25/17 1445 03/01/17 0630  WBC 4.1  --  5.4  < > 5.2 6.0 5.6  NEUTROABS  2.1  --  2.3  --  3.3  --   --   HGB 13.8  < > 14.2  < > 13.5 13.5 12.3*  HCT 40.8  --  41.2  < > 39.2 40.0 36.9*  MCV 93.6  --  93.4  < > 91.6 94.6 93.9  PLT 140*  --  179  < > 176 196 158  < > = values in this interval not displayed. Lab Results  Component Value Date   TSH 3.915 03/01/2017   Lab Results  Component Value Date   HGBA1C 5.4 02/15/2017   Lab Results  Component Value Date   CHOL 150 02/15/2017   HDL 43 02/15/2017   LDLCALC 96 02/15/2017   TRIG 53 02/15/2017   CHOLHDL 3.5 02/15/2017    Significant Diagnostic Results in last 30 days:  Ct Angio Head W/cm &/or Wo Cm  Result Date: 02/14/2017 CLINICAL DATA:  Stroke.  TPA administered EXAM: CT ANGIOGRAPHY HEAD AND NECK TECHNIQUE: Multidetector CT imaging of the head and neck was performed using the standard protocol during bolus administration of intravenous contrast. Multiplanar CT image reconstructions and MIPs were obtained to evaluate the vascular anatomy. Carotid stenosis measurements (when applicable) are obtained utilizing NASCET criteria, using the distal internal carotid diameter as the denominator. CONTRAST:  100 mL Isovue 370 IV COMPARISON:  CT head  02/14/2017 FINDINGS: CTA NECK FINDINGS Aortic arch: Atherosclerotic disease in the aortic arch and proximal great vessels. Two vessel branching arch. Atherosclerotic calcification proximal great vessels without significant stenosis. Right carotid system: Right common carotid artery mildly diseased without significant stenosis. Atherosclerotic calcification right carotid bifurcation without significant stenosis. Left carotid system: Left common carotid artery widely patent. Atherosclerotic calcification left carotid bifurcation without significant stenosis. Vertebral arteries: Right vertebral artery dominant. Right vertebral artery widely patent to the basilar without significant stenosis. Nondominant left vertebral artery also widely patent without significant stenosis. Skeleton: Cervical spine degenerative change. No acute skeletal abnormality. Other neck: Negative for mass or adenopathy Upper chest: Negative Review of the MIP images confirms the above findings CTA HEAD FINDINGS Anterior circulation: Mild atherosclerotic calcification cavernous carotid bilaterally without significant stenosis. Mild atherosclerotic disease M1 segment bilaterally. Atherosclerotic irregularity in the M2 branches bilaterally. No large vessel occlusion. Posterior circulation: Both vertebral arteries patent to the basilar. PICA patent bilaterally. Basilar widely patent. Mild ectasia of the basilar with tortuosity. Superior cerebellar arteries patent bilaterally. Moderate stenosis P2 segment bilaterally. Venous sinuses: Patent Anatomic variants: None Delayed phase: Normal enhancement on delayed imaging. Atrophy with diffuse small vessel ischemia. Review of the MIP images confirms the above findings IMPRESSION: No significant carotid or vertebral artery stenosis in the neck Diffuse intracranial atherosclerotic disease including the middle cerebral arteries bilaterally. Moderate stenosis P2 segment bilaterally in the posterior cerebral  arteries. No intracranial large vessel occlusion. No intracranial hemorrhage identified. Electronically Signed   By: Franchot Gallo M.D.   On: 02/14/2017 19:55   Ct Head Wo Contrast  Result Date: 02/15/2017 CLINICAL DATA:  Followup stroke, 24 hours after tPA. History of hypertension, prostate cancer, stroke. EXAM: CT HEAD WITHOUT CONTRAST TECHNIQUE: Contiguous axial images were obtained from the base of the skull through the vertex without intravenous contrast. COMPARISON:  CT HEAD February 14, 2017 FINDINGS: BRAIN: Acute LEFT frontoparietal cytotoxic edema extending to the insula and operculum, new from prior CT. No intraparenchymal hemorrhage. Old small RIGHT cerebellar infarct. Old basal ganglia and RIGHT thalamus lacunar infarcts. Punctate RIGHT cerebellar parenchymal calcification. Patchy to confluent supratentorial white matter  hypodensities. Ventricles and sulci are overall normal for patient's age. No abnormal extra-axial fluid collections. Basal cisterns are patent. VASCULAR: Dolichoectatic intracranial vessels. Moderate calcific atherosclerosis carotid siphon. Re- demonstration of calcification LEFT MCA. SKULL: No skull fracture. No significant scalp soft tissue swelling. SINUSES/ORBITS: The mastoid air-cells and included paranasal sinuses are well-aerated.The included ocular globes and orbital contents are non-suspicious. OTHER: Patient is edentulous. IMPRESSION: 1. New, acute LEFT MCA territory nonhemorrhagic infarct. 2. Old lacunar infarcts and moderate to severe chronic small vessel ischemic disease. Acute findings text paged to [facility], Neurology via AMION secure system on 02/15/2017 at 5:50 pm. Electronically Signed   By: Elon Alas M.D.   On: 02/15/2017 17:52   Ct Angio Neck W And/or Wo Contrast  Result Date: 02/14/2017 CLINICAL DATA:  Stroke.  TPA administered EXAM: CT ANGIOGRAPHY HEAD AND NECK TECHNIQUE: Multidetector CT imaging of the head and neck was performed using the  standard protocol during bolus administration of intravenous contrast. Multiplanar CT image reconstructions and MIPs were obtained to evaluate the vascular anatomy. Carotid stenosis measurements (when applicable) are obtained utilizing NASCET criteria, using the distal internal carotid diameter as the denominator. CONTRAST:  100 mL Isovue 370 IV COMPARISON:  CT head 02/14/2017 FINDINGS: CTA NECK FINDINGS Aortic arch: Atherosclerotic disease in the aortic arch and proximal great vessels. Two vessel branching arch. Atherosclerotic calcification proximal great vessels without significant stenosis. Right carotid system: Right common carotid artery mildly diseased without significant stenosis. Atherosclerotic calcification right carotid bifurcation without significant stenosis. Left carotid system: Left common carotid artery widely patent. Atherosclerotic calcification left carotid bifurcation without significant stenosis. Vertebral arteries: Right vertebral artery dominant. Right vertebral artery widely patent to the basilar without significant stenosis. Nondominant left vertebral artery also widely patent without significant stenosis. Skeleton: Cervical spine degenerative change. No acute skeletal abnormality. Other neck: Negative for mass or adenopathy Upper chest: Negative Review of the MIP images confirms the above findings CTA HEAD FINDINGS Anterior circulation: Mild atherosclerotic calcification cavernous carotid bilaterally without significant stenosis. Mild atherosclerotic disease M1 segment bilaterally. Atherosclerotic irregularity in the M2 branches bilaterally. No large vessel occlusion. Posterior circulation: Both vertebral arteries patent to the basilar. PICA patent bilaterally. Basilar widely patent. Mild ectasia of the basilar with tortuosity. Superior cerebellar arteries patent bilaterally. Moderate stenosis P2 segment bilaterally. Venous sinuses: Patent Anatomic variants: None Delayed phase: Normal  enhancement on delayed imaging. Atrophy with diffuse small vessel ischemia. Review of the MIP images confirms the above findings IMPRESSION: No significant carotid or vertebral artery stenosis in the neck Diffuse intracranial atherosclerotic disease including the middle cerebral arteries bilaterally. Moderate stenosis P2 segment bilaterally in the posterior cerebral arteries. No intracranial large vessel occlusion. No intracranial hemorrhage identified. Electronically Signed   By: Franchot Gallo M.D.   On: 02/14/2017 19:55   Dg Chest Port 1 View  Result Date: 02/16/2017 CLINICAL DATA:  CHF EXAM: PORTABLE CHEST 1 VIEW COMPARISON:  06/02/2016 FINDINGS: Borderline cardiomegaly, accentuated by technique. Stable appearance of the hila. Low volume chest with interstitial crowding. Suspect streaky atelectasis. No pneumothorax or suspected edema. IMPRESSION: 1. Negative for pulmonary edema. 2. Low volume chest with mild atelectasis. Electronically Signed   By: Monte Fantasia M.D.   On: 02/16/2017 07:21   Mr Brain Limited Wo Contrast  Result Date: 02/16/2017 CLINICAL DATA:  Stroke EXAM: MRI HEAD WITHOUT CONTRAST TECHNIQUE: Multiplanar, multiecho pulse sequences of the brain and surrounding structures were obtained without intravenous contrast. COMPARISON:  CT head 02/15/2017 FINDINGS: Brain: Acute infarct left parietal operculum extending  into the left parietal lobe as noted on CT. Small cortical infarct in the posterior right parietal lobe. No other acute infarct Susceptibility right cerebellum unchanged from prior MRI. Multiple areas of chronic microhemorrhage are seen on the prior study. Study was limited to diffusion only. IMPRESSION: IMPRESSION Moderately large acute infarct in the left parietal lobe Small acute infarct right posterior parietal lobe. Electronically Signed   By: Franchot Gallo M.D.   On: 02/16/2017 11:49   Ct Renal Stone Study  Result Date: 02/07/2017 CLINICAL DATA:  Patient fell last  evening going to the bathroom falling on buttocks. Known left renal mass. Patient reports hematuria with clots. EXAM: CT ABDOMEN AND PELVIS WITHOUT CONTRAST TECHNIQUE: Multidetector CT imaging of the abdomen and pelvis was performed following the standard protocol without IV contrast. COMPARISON:  CT 06/05/2015 FINDINGS: Lower chest: Stable cardiomegaly with left main and 3 vessel coronary arteriosclerosis. Mild to moderate aortic aortic atherosclerosis. Dependent atelectasis in both lower lobes and lingula with mild right lower lobe bronchiectasis. No effusion. Hepatobiliary: Stable low-density lesions statistically consistent with cysts or hemangiomata are again noted of the liver some are too small to further characterize, the largest are in the caudate measuring 13 mm and in the left lower lobe measuring 19 mm. Status post cholecystectomy. No biliary dilatation. Pancreas: Unremarkable. No pancreatic ductal dilatation or surrounding inflammatory changes. Spleen: Small splenic granulomas.  No splenomegaly. Adrenals/Urinary Tract: Normal bilateral adrenal glands. The right kidney is stable in appearance with cortical scarring and 2 peripherally calcified interpolar lesions again noted measuring up to 1.3 cm anteriorly and 1 cm posteriorly. There has been some interval increase in size of lobular interpolar anterior left renal mass measuring 5.6 x 4.8 cm versus 5.1 x 4.7 cm previously in the same axial plane. Hyperdense posterior interpolar mass possibly a hemorrhagic or proteinaceous cysts is stable to slightly smaller in appearance measuring 2.3 x 1.6 cm versus 2.3 cm a 2.3 cm previously. Peripherally calcified left upper pole mass is stable at 2.3 cm. A simple exophytic cyst off the lower pole the left kidney is again noted measuring up to 3.5 cm in maximum dimension with water attenuation within. No nephrolithiasis or hydroureteronephrosis. The urinary bladder is physiologically distended without bold mural  thickening, calculus or focal mass. Stomach/Bowel: Stomach is within normal limits. Appendix appears normal. No evidence of bowel wall thickening, distention, or inflammatory changes. Vascular/Lymphatic: No enlarged abdominal or pelvic lymph nodes. Extensive atherosclerosis of the aorta and its branch vessels. No aneurysm. Reproductive: Normal size prostate with central zone calcifications. Unremarkable seminal vesicles. Other: No abdominopelvic ascites.  No free air. Musculoskeletal: Left hip arthroplasty. Schmorl's node off the inferior endplate of L2 is unchanged. Degenerative disc disease L5-S1 with moderate disc space narrowing. No acute nor suspicious osseous abnormalities. IMPRESSION: 1. Further mild enlargement of previously described anterior left renal mass currently estimated at 5.6 x 4.8 cm versus 5.1 x 4.7 cm previously. Stable partially calcified bilateral renal lesions measuring up to 1.3 cm in the upper pole the right kidney and 2.3 cm in the upper pole the left kidney. Stable complex and simple cysts of the left kidney. 2. No acute solid nor hollow visceral organ injury. No laceration or hematoma. 3. Chronic degenerative change of the lumbar spine. No acute nor suspicious osseous lesions. 4. Stable cysts or hemangiomata of the liver. 5. Stable cardiomegaly with coronary arteriosclerosis. Electronically Signed   By: Ashley Royalty M.D.   On: 02/07/2017 20:27   Ct Head Code Stroke Wo  Contrast  Result Date: 02/14/2017 CLINICAL DATA:  Code stroke. Altered mental status, speech difficulties. History of migraine, stroke, atrial fibrillation. EXAM: CT HEAD WITHOUT CONTRAST TECHNIQUE: Contiguous axial images were obtained from the base of the skull through the vertex without intravenous contrast. COMPARISON:  CT HEAD June 22, 2016 FINDINGS: BRAIN: No intraparenchymal hemorrhage, mass effect nor midline shift. The ventricles and sulci are normal for age. Patchy to confluent supratentorial white matter  hypodensities. New age indeterminate RIGHT basal ganglia lacunar infarct Old RIGHT thalamus cystic old small RIGHT cerebellar infarct. Next item moderate to cysts Infarct though, new from January 2018. Old RIGHT superior cerebellar small infarct. No acute large vascular territory infarcts. No abnormal extra-axial fluid collections. Basal cisterns are patent. VASCULAR: Moderate calcific atherosclerosis of the carotid siphons. Calcified LEFT MCA, unchanged. Dolichoectatic intracranial vessels seen with chronic hypertension. SKULL: No skull fracture. Subcentimeter mildly expansile probable epidermal inclusion cyst LEFT temporal bone. No significant scalp soft tissue swelling. SINUSES/ORBITS: The mastoid air-cells and included paranasal sinuses are well-aerated.The included ocular globes and orbital contents are non-suspicious. OTHER: None. ASPECTS (Continental Stroke Program Early CT Score) - Ganglionic level infarction (caudate, lentiform nuclei, internal capsule, insula, M1-M3 cortex): 7 - Supraganglionic infarction (M4-M6 cortex): 3 Total score (0-10 with 10 being normal): 10 IMPRESSION: 1. New age indeterminate RIGHT basal ganglia lacunar infarct. 2. New nonacute RIGHT thalamus infarct. 3. Old small RIGHT cerebellar infarct. Stable moderate to severe chronic small vessel ischemic disease. 4. ASPECTS is 10. 5. Critical Value/emergent results were called by telephone at the time of interpretation on 02/14/2017 at 6:30 pm to Dr. Fredia Sorrow , who verbally acknowledged these results. Electronically Signed   By: Elon Alas M.D.   On: 02/14/2017 18:32    Assessment/Plan  1 hematuria this is recurrent he is followed by urology-at this point will reduce his dose of  Eliquis down to 2.5 mg twice a day-also will encourage fluids and have  monitored output.  Will recheck a CBC on Thursday--hemoglobin is stable today at 12.3--clinically he appears stable but this will have to be watched again a challenging  situation with his history of CVA and need for anticoagulation.  #2-history of atrial fibrillation-as noted above will reduce his dose of Eliquis down to 2.5 mg twice a day rate appears to be controlled on metoprolol-at this point will monitor.  #3 l hand edema--at this point will monitor it is cool to touch does not appear to be infected elevation will have to be encouraged  suspect there is an element of dependent edema here  Of note plan of care was discussed with Dr. Lyndel Safe  CPT [id number]-

## 2017-03-02 ENCOUNTER — Encounter (HOSPITAL_COMMUNITY): Payer: Self-pay | Admitting: *Deleted

## 2017-03-02 ENCOUNTER — Emergency Department (HOSPITAL_COMMUNITY)
Admission: EM | Admit: 2017-03-02 | Discharge: 2017-03-02 | Disposition: A | Discharge status: Skilled Nursing Facility | Payer: Medicare Other | Attending: Emergency Medicine | Admitting: Emergency Medicine

## 2017-03-02 ENCOUNTER — Non-Acute Institutional Stay (SKILLED_NURSING_FACILITY): Payer: Medicare Other | Admitting: Internal Medicine

## 2017-03-02 ENCOUNTER — Encounter: Payer: Self-pay | Admitting: Internal Medicine

## 2017-03-02 ENCOUNTER — Inpatient Hospital Stay
Admission: RE | Admit: 2017-03-02 | Discharge: 2017-03-10 | Disposition: A | Discharge status: Nursing Home (Includes ICF) | Payer: Medicare Other | Source: Ambulatory Visit | Attending: Internal Medicine | Admitting: Internal Medicine

## 2017-03-02 DIAGNOSIS — I10 Essential (primary) hypertension: Secondary | ICD-10-CM | POA: Insufficient documentation

## 2017-03-02 DIAGNOSIS — E039 Hypothyroidism, unspecified: Secondary | ICD-10-CM | POA: Insufficient documentation

## 2017-03-02 DIAGNOSIS — Z79899 Other long term (current) drug therapy: Secondary | ICD-10-CM | POA: Diagnosis not present

## 2017-03-02 DIAGNOSIS — R600 Localized edema: Secondary | ICD-10-CM | POA: Diagnosis not present

## 2017-03-02 DIAGNOSIS — Z87891 Personal history of nicotine dependence: Secondary | ICD-10-CM | POA: Insufficient documentation

## 2017-03-02 DIAGNOSIS — R319 Hematuria, unspecified: Secondary | ICD-10-CM | POA: Diagnosis not present

## 2017-03-02 DIAGNOSIS — F0390 Unspecified dementia without behavioral disturbance: Secondary | ICD-10-CM | POA: Diagnosis not present

## 2017-03-02 DIAGNOSIS — Z8673 Personal history of transient ischemic attack (TIA), and cerebral infarction without residual deficits: Secondary | ICD-10-CM | POA: Insufficient documentation

## 2017-03-02 DIAGNOSIS — R233 Spontaneous ecchymoses: Secondary | ICD-10-CM | POA: Diagnosis not present

## 2017-03-02 DIAGNOSIS — R310 Gross hematuria: Secondary | ICD-10-CM | POA: Diagnosis not present

## 2017-03-02 LAB — BASIC METABOLIC PANEL
Anion gap: 9 (ref 5–15)
BUN: 24 mg/dL — AB (ref 6–20)
CHLORIDE: 104 mmol/L (ref 101–111)
CO2: 24 mmol/L (ref 22–32)
Calcium: 8.6 mg/dL — ABNORMAL LOW (ref 8.9–10.3)
Creatinine, Ser: 0.95 mg/dL (ref 0.61–1.24)
GFR calc Af Amer: >60 mL/min (ref >60)
GLUCOSE: 102 mg/dL — AB (ref 65–99)
POTASSIUM: 4.3 mmol/L (ref 3.5–5.1)
Sodium: 137 mmol/L (ref 135–145)

## 2017-03-02 LAB — CBC
HEMATOCRIT: 34.4 % — AB (ref 39.0–52.0)
Hemoglobin: 11.5 g/dL — ABNORMAL LOW (ref 13.0–17.0)
MCH: 31.7 pg (ref 26.0–34.0)
MCHC: 33.4 g/dL (ref 30.0–36.0)
MCV: 94.8 fL (ref 78.0–100.0)
PLATELETS: 163 10*3/uL (ref 150–400)
RBC: 3.63 MIL/uL — AB (ref 4.22–5.81)
RDW: 13.6 % (ref 11.5–15.5)
WBC: 4.9 10*3/uL (ref 4.0–10.5)

## 2017-03-02 LAB — URINALYSIS, ROUTINE W REFLEX MICROSCOPIC
Bilirubin Urine: NEGATIVE
GLUCOSE, UA: 100 mg/dL — AB
KETONES UR: 15 mg/dL — AB
Nitrite: POSITIVE — AB
Specific Gravity, Urine: 1.015 (ref 1.005–1.030)
pH: 7 (ref 5.0–8.0)

## 2017-03-02 LAB — URINALYSIS, MICROSCOPIC (REFLEX)

## 2017-03-02 MED ORDER — LIDOCAINE HCL 2 % EX GEL
CUTANEOUS | Status: AC
  Filled 2017-03-02: qty 10

## 2017-03-02 NOTE — Discharge Instructions (Signed)
The urine results show NITRITE positive urine, but we would like to make Urologist to decide if antibiotics are needed, since you just completed dose of antibiotics.  Ask Urologist about ELIQUIS continuation.  Return to the ER if the symptoms get worse.

## 2017-03-02 NOTE — ED Notes (Signed)
Pt does not have a foley.  Per family pt is here d/t blood in urine.

## 2017-03-02 NOTE — ED Triage Notes (Signed)
Pt comes in from [facility]. He followed up with [facility] last week and they replaced his foley. Since then he has been having bloodly urine that has progressively gotten worse and looks like clots now. [facility] and they stated that he needed his foley flushed. Pt was sent here.   Pt is a post stroke patient with right side weakness. He had a fall over the weekend and now has right hand swelling. Pt has had this x-rayed at facility.

## 2017-03-02 NOTE — Progress Notes (Signed)
Location:   [facility] Room Number: 132/P Place of Service:  SNF (31) Provider:  Ewell Poe, MD  Patient Care Team: Kathyrn Drown, MD as PCP - General (Family Medicine) Herminio Commons, MD as Attending Physician (Cardiology)  Extended Emergency Contact Information Primary Emergency Contact: Maeola Sarah of [place] Mobile Phone: [phone] Relation: Daughter Secondary Emergency Contact: Pegram,[name] Address: [address]          [place], [place] [address] Johnnette Litter of [place] Phone: [phone] Work Phone: [phone] Mobile Phone: [phone] Relation: Daughter  Code Status:  DNR Goals of care: Advanced Directive information Advanced Directives 03/02/2017  Does Patient Have a Medical Advance Directive? Yes  Type of Advance Directive Out of facility DNR (pink MOST or yellow form)  Does patient want to make changes to medical advance directive? No - Patient declined  Copy of Protection in Chart? No - copy requested  Would patient like information on creating a medical advance directive? No - Patient declined     Chief complaint-acute visit secondary to continued hematuria with clots-  HPI:  Pt is a 83 y.o. male seen today for an acute visit for follow-up of hematuria.  Patient has a history of atrial fibrillation recurrent stroke as well as hematuria with a renal mass-hypertension hypothyroidism hyperlipidemia and history of prostate cancer status post radiation and chemotherapy.  Also has a history of chronic leukopenia and thrombocytopenia and had a hip fracture in July 2017.  Most recent was admitted to skilled nursing after sustaining an acute left MCA with right-sided weakness.  He had been taken offEliquis previously because of significant hematuria.--Subsequently had is been restarted secondary to his recurrent CVA.  He is also has a history of atrial fibrillation is on  Metroprolol.  In regards to hematuria he has been seen by urology and the previously recommended removing the Foley catheter and check for voiding-apparently he has been voiding fairly well but it appears she's had some recurrence of hematuria I saw him yesterday--at that point he did not have clots but did have significant hematuria his hemoglobin was stable however at 12.3.  This was discussed with Dr. Lyndel Safe we did reduce his Eliquist down to 2.5 mg twice a day  AHowever today he is passing some clots per nursing staff-he is a poor historian secondary to dementia but does not appear to be having significant pain.--    His vital signs are stable      Past Medical History:  Diagnosis Date  . Afib ([place])   . Atrial fibrillation ([place])   . Cerebral infarction ([place])   . DDD (degenerative disc disease), lumbar   . Dysphagia   . Hypertension   . Hypothyroidism   . Migraine    "maybe monthly" (11/21/2015)  . Prostate cancer ([place]) ~ 1995   S/P radiation tx  . Renal mass    "slow growing something; doctors just watching it right now" (11/21/2015)  . Stroke [facility])    TIA  . Thrombocytopenia ([place])   . Thyroid disease    hypothyroid   Past Surgical History:  Procedure Laterality Date  . COLONOSCOPY  1999  . ESOPHAGOGASTRODUODENOSCOPY    . LAPAROSCOPIC CHOLECYSTECTOMY  1980s  . PROSTATE BIOPSY  ~ 1995  . TOTAL HIP ARTHROPLASTY Left 11/22/2015   Procedure: LEFT HIP HEMIARTHROPLASTY ;  Surgeon: Leandrew Koyanagi, MD;  Location: [facility];  Service: Orthopedics;  Laterality: Left;  Allergies  Allergen Reactions  . Xanax [Alprazolam] Other (See Comments)    Dizzy, Sedated  . Tape Rash    PATIENT'S SKIN WILL TEAR/PLEASE EITHER USE PAPER TAPE OR COBAN WRAP!!    Outpatient Encounter Prescriptions as of 03/02/2017  Medication Sig  . acetaminophen (TYLENOL) 325 MG tablet Take 325-650 mg by mouth every 6 (six) hours as needed for mild pain or moderate pain.  Marland Kitchen amLODipine (NORVASC) 5 MG tablet  Take 1 tablet (5 mg total) by mouth daily.  Marland Kitchen apixaban (ELIQUIS) 2.5 MG TABS tablet Take 2.5 mg by mouth 2 (two) times daily.  Marland Kitchen atorvastatin (LIPITOR) 20 MG tablet Take 1 tablet (20 mg total) by mouth daily at 6 PM.  . cetirizine (ZYRTEC) 10 MG tablet Take 10 mg by mouth every evening.   Marland Kitchen ketoconazole (NIZORAL) 2 % cream Apply 1 application topically 2 (two) times daily. Apply to scrotal area  . levothyroxine (SYNTHROID, LEVOTHROID) 25 MCG tablet Take 1 tablet (25 mcg total) by mouth daily before breakfast.  . metoprolol succinate (TOPROL-XL) 25 MG 24 hr tablet Take 1 tablet (25 mg total) by mouth every evening.  . polyethylene glycol powder (GLYCOLAX/MIRALAX) powder MIX 1 CAPFUL IN 8 OZ OF WATER AND DRINK twice weekly  . [DISCONTINUED] apixaban (ELIQUIS) 5 MG TABS tablet Take 5 mg by mouth 2 (two) times daily.   No facility-administered encounter medications on file as of 03/02/2017.     Review of Systems   Cannot really obtain today since patient is not speaking much please see history of present illness  Immunization History  Administered Date(s) Administered  . Influenza Split 02/28/2013  . Influenza, High Dose Seasonal PF 02/19/2017  . Influenza,inj,Quad PF,6+ Mos 03/01/2014, 02/18/2015, 02/26/2016  . Influenza-Unspecified 01/24/2012  . PPD Test 11/25/2015  . Pneumococcal Conjugate-13 03/01/2014  . Pneumococcal Polysaccharide-23 01/24/2012  . Td 12/13/2007  . Zoster 09/23/2007   Pertinent  Health Maintenance Due  Topic Date Due  . INFLUENZA VACCINE  Completed  . PNA vac Low Risk Adult  Completed   Fall Risk  07/08/2016 11/09/2013  Falls in the past year? No No   Functional Status Survey:    Vitals:   03/02/17 1529  BP: 115/62  Pulse: 68  Resp: 20  Temp: 98.2 F (36.8 C)  TempSrc: Oral    Physical Exam   In general this is a frail elderly male in no distress he is sleeping but does respond when one uses tactile stimuli open says eyes does speak a small  amount.  .  His skin is warm and dry. Chest is clear to auscultation with somewhatpoor effort  there is no labored breathing   Heart is irregular irregular rate and rhythm without murmur gallop or rub has fairly minimal lower extremity edema.  Abdomen is soft does not appear to be acutely tender appears there is some reaction to the invasive maneuver-bowel sounds are positive  GU-I could appreciate some bleeding in diaper he does not appear to have much suprapubic tenderness.  Musculoskeletal limited exam since he is in bed he does have some swelling of his right hand radial pulses intact does not appear to be acutely tender does not appear to have significant pain with flexion and extension of the right wrist   He does have been itching of her right hand history of skin tear.  Appears able to move his extremities at baseline he was able to transfer with assistance to the wheelchair when he was transferred to wheelchair  later in my visit.  Neurologic appears grossly intact again he has minimal speech which is not new does follow again simple verbal commands but it takes a while.  Psych-she is cooperative with exam again does take a while however      Labs reviewed:  Recent Labs  02/20/17 0730 02/25/17 1445 03/01/17 0630  NA 138 137 136  K 4.1 3.6 3.9  CL 104 102 102  CO2 25 25 25   GLUCOSE 101* 125* 103*  BUN 16 26* 20  CREATININE 0.92 0.96 0.84  CALCIUM 9.3 9.0 9.0    Recent Labs  02/14/17 1825 02/25/17 1445 03/01/17 0630  AST 23 25 19   ALT 13* 17 14*  ALKPHOS 59 58 58  BILITOT 0.9 1.1 1.3*  PROT 7.3 6.8 6.2*  ALBUMIN 4.0 3.8 3.5    Recent Labs  02/07/17 1938  02/14/17 1825  02/20/17 0730 02/25/17 1445 03/01/17 0630  WBC 4.1  --  5.4  < > 5.2 6.0 5.6  NEUTROABS 2.1  --  2.3  --  3.3  --   --   HGB 13.8  < > 14.2  < > 13.5 13.5 12.3*  HCT 40.8  --  41.2  < > 39.2 40.0 36.9*  MCV 93.6  --  93.4  < > 91.6 94.6 93.9  PLT 140*  --  179  < > 176 196 158   < > = values in this interval not displayed. Lab Results  Component Value Date   TSH 3.915 03/01/2017   Lab Results  Component Value Date   HGBA1C 5.4 02/15/2017   Lab Results  Component Value Date   CHOL 150 02/15/2017   HDL 43 02/15/2017   LDLCALC 96 02/15/2017   TRIG 53 02/15/2017   CHOLHDL 3.5 02/15/2017    Significant Diagnostic Results in last 30 days:  Ct Angio Head W/cm &/or Wo Cm  Result Date: 02/14/2017 CLINICAL DATA:  Stroke.  TPA administered EXAM: CT ANGIOGRAPHY HEAD AND NECK TECHNIQUE: Multidetector CT imaging of the head and neck was performed using the standard protocol during bolus administration of intravenous contrast. Multiplanar CT image reconstructions and MIPs were obtained to evaluate the vascular anatomy. Carotid stenosis measurements (when applicable) are obtained utilizing NASCET criteria, using the distal internal carotid diameter as the denominator. CONTRAST:  100 mL Isovue 370 IV COMPARISON:  CT head 02/14/2017 FINDINGS: CTA NECK FINDINGS Aortic arch: Atherosclerotic disease in the aortic arch and proximal great vessels. Two vessel branching arch. Atherosclerotic calcification proximal great vessels without significant stenosis. Right carotid system: Right common carotid artery mildly diseased without significant stenosis. Atherosclerotic calcification right carotid bifurcation without significant stenosis. Left carotid system: Left common carotid artery widely patent. Atherosclerotic calcification left carotid bifurcation without significant stenosis. Vertebral arteries: Right vertebral artery dominant. Right vertebral artery widely patent to the basilar without significant stenosis. Nondominant left vertebral artery also widely patent without significant stenosis. Skeleton: Cervical spine degenerative change. No acute skeletal abnormality. Other neck: Negative for mass or adenopathy Upper chest: Negative Review of the MIP images confirms the above findings CTA  HEAD FINDINGS Anterior circulation: Mild atherosclerotic calcification cavernous carotid bilaterally without significant stenosis. Mild atherosclerotic disease M1 segment bilaterally. Atherosclerotic irregularity in the M2 branches bilaterally. No large vessel occlusion. Posterior circulation: Both vertebral arteries patent to the basilar. PICA patent bilaterally. Basilar widely patent. Mild ectasia of the basilar with tortuosity. Superior cerebellar arteries patent bilaterally. Moderate stenosis P2 segment bilaterally. Venous sinuses: Patent Anatomic variants: None Delayed phase:  Normal enhancement on delayed imaging. Atrophy with diffuse small vessel ischemia. Review of the MIP images confirms the above findings IMPRESSION: No significant carotid or vertebral artery stenosis in the neck Diffuse intracranial atherosclerotic disease including the middle cerebral arteries bilaterally. Moderate stenosis P2 segment bilaterally in the posterior cerebral arteries. No intracranial large vessel occlusion. No intracranial hemorrhage identified. Electronically Signed   By: Franchot Gallo M.D.   On: 02/14/2017 19:55   Ct Head Wo Contrast  Result Date: 02/15/2017 CLINICAL DATA:  Followup stroke, 24 hours after tPA. History of hypertension, prostate cancer, stroke. EXAM: CT HEAD WITHOUT CONTRAST TECHNIQUE: Contiguous axial images were obtained from the base of the skull through the vertex without intravenous contrast. COMPARISON:  CT HEAD February 14, 2017 FINDINGS: BRAIN: Acute LEFT frontoparietal cytotoxic edema extending to the insula and operculum, new from prior CT. No intraparenchymal hemorrhage. Old small RIGHT cerebellar infarct. Old basal ganglia and RIGHT thalamus lacunar infarcts. Punctate RIGHT cerebellar parenchymal calcification. Patchy to confluent supratentorial white matter hypodensities. Ventricles and sulci are overall normal for patient's age. No abnormal extra-axial fluid collections. Basal cisterns  are patent. VASCULAR: Dolichoectatic intracranial vessels. Moderate calcific atherosclerosis carotid siphon. Re- demonstration of calcification LEFT MCA. SKULL: No skull fracture. No significant scalp soft tissue swelling. SINUSES/ORBITS: The mastoid air-cells and included paranasal sinuses are well-aerated.The included ocular globes and orbital contents are non-suspicious. OTHER: Patient is edentulous. IMPRESSION: 1. New, acute LEFT MCA territory nonhemorrhagic infarct. 2. Old lacunar infarcts and moderate to severe chronic small vessel ischemic disease. Acute findings text paged to [facility], Neurology via AMION secure system on 02/15/2017 at 5:50 pm. Electronically Signed   By: Elon Alas M.D.   On: 02/15/2017 17:52   Ct Angio Neck W And/or Wo Contrast  Result Date: 02/14/2017 CLINICAL DATA:  Stroke.  TPA administered EXAM: CT ANGIOGRAPHY HEAD AND NECK TECHNIQUE: Multidetector CT imaging of the head and neck was performed using the standard protocol during bolus administration of intravenous contrast. Multiplanar CT image reconstructions and MIPs were obtained to evaluate the vascular anatomy. Carotid stenosis measurements (when applicable) are obtained utilizing NASCET criteria, using the distal internal carotid diameter as the denominator. CONTRAST:  100 mL Isovue 370 IV COMPARISON:  CT head 02/14/2017 FINDINGS: CTA NECK FINDINGS Aortic arch: Atherosclerotic disease in the aortic arch and proximal great vessels. Two vessel branching arch. Atherosclerotic calcification proximal great vessels without significant stenosis. Right carotid system: Right common carotid artery mildly diseased without significant stenosis. Atherosclerotic calcification right carotid bifurcation without significant stenosis. Left carotid system: Left common carotid artery widely patent. Atherosclerotic calcification left carotid bifurcation without significant stenosis. Vertebral arteries: Right vertebral artery dominant.  Right vertebral artery widely patent to the basilar without significant stenosis. Nondominant left vertebral artery also widely patent without significant stenosis. Skeleton: Cervical spine degenerative change. No acute skeletal abnormality. Other neck: Negative for mass or adenopathy Upper chest: Negative Review of the MIP images confirms the above findings CTA HEAD FINDINGS Anterior circulation: Mild atherosclerotic calcification cavernous carotid bilaterally without significant stenosis. Mild atherosclerotic disease M1 segment bilaterally. Atherosclerotic irregularity in the M2 branches bilaterally. No large vessel occlusion. Posterior circulation: Both vertebral arteries patent to the basilar. PICA patent bilaterally. Basilar widely patent. Mild ectasia of the basilar with tortuosity. Superior cerebellar arteries patent bilaterally. Moderate stenosis P2 segment bilaterally. Venous sinuses: Patent Anatomic variants: None Delayed phase: Normal enhancement on delayed imaging. Atrophy with diffuse small vessel ischemia. Review of the MIP images confirms the above findings IMPRESSION: No significant carotid or  vertebral artery stenosis in the neck Diffuse intracranial atherosclerotic disease including the middle cerebral arteries bilaterally. Moderate stenosis P2 segment bilaterally in the posterior cerebral arteries. No intracranial large vessel occlusion. No intracranial hemorrhage identified. Electronically Signed   By: Franchot Gallo M.D.   On: 02/14/2017 19:55   Dg Chest Port 1 View  Result Date: 02/16/2017 CLINICAL DATA:  CHF EXAM: PORTABLE CHEST 1 VIEW COMPARISON:  06/02/2016 FINDINGS: Borderline cardiomegaly, accentuated by technique. Stable appearance of the hila. Low volume chest with interstitial crowding. Suspect streaky atelectasis. No pneumothorax or suspected edema. IMPRESSION: 1. Negative for pulmonary edema. 2. Low volume chest with mild atelectasis. Electronically Signed   By: Monte Fantasia  M.D.   On: 02/16/2017 07:21   Mr Brain Limited Wo Contrast  Result Date: 02/16/2017 CLINICAL DATA:  Stroke EXAM: MRI HEAD WITHOUT CONTRAST TECHNIQUE: Multiplanar, multiecho pulse sequences of the brain and surrounding structures were obtained without intravenous contrast. COMPARISON:  CT head 02/15/2017 FINDINGS: Brain: Acute infarct left parietal operculum extending into the left parietal lobe as noted on CT. Small cortical infarct in the posterior right parietal lobe. No other acute infarct Susceptibility right cerebellum unchanged from prior MRI. Multiple areas of chronic microhemorrhage are seen on the prior study. Study was limited to diffusion only. IMPRESSION: IMPRESSION Moderately large acute infarct in the left parietal lobe Small acute infarct right posterior parietal lobe. Electronically Signed   By: Franchot Gallo M.D.   On: 02/16/2017 11:49   Ct Renal Stone Study  Result Date: 02/07/2017 CLINICAL DATA:  Patient fell last evening going to the bathroom falling on buttocks. Known left renal mass. Patient reports hematuria with clots. EXAM: CT ABDOMEN AND PELVIS WITHOUT CONTRAST TECHNIQUE: Multidetector CT imaging of the abdomen and pelvis was performed following the standard protocol without IV contrast. COMPARISON:  CT 06/05/2015 FINDINGS: Lower chest: Stable cardiomegaly with left main and 3 vessel coronary arteriosclerosis. Mild to moderate aortic aortic atherosclerosis. Dependent atelectasis in both lower lobes and lingula with mild right lower lobe bronchiectasis. No effusion. Hepatobiliary: Stable low-density lesions statistically consistent with cysts or hemangiomata are again noted of the liver some are too small to further characterize, the largest are in the caudate measuring 13 mm and in the left lower lobe measuring 19 mm. Status post cholecystectomy. No biliary dilatation. Pancreas: Unremarkable. No pancreatic ductal dilatation or surrounding inflammatory changes. Spleen: Small splenic  granulomas.  No splenomegaly. Adrenals/Urinary Tract: Normal bilateral adrenal glands. The right kidney is stable in appearance with cortical scarring and 2 peripherally calcified interpolar lesions again noted measuring up to 1.3 cm anteriorly and 1 cm posteriorly. There has been some interval increase in size of lobular interpolar anterior left renal mass measuring 5.6 x 4.8 cm versus 5.1 x 4.7 cm previously in the same axial plane. Hyperdense posterior interpolar mass possibly a hemorrhagic or proteinaceous cysts is stable to slightly smaller in appearance measuring 2.3 x 1.6 cm versus 2.3 cm a 2.3 cm previously. Peripherally calcified left upper pole mass is stable at 2.3 cm. A simple exophytic cyst off the lower pole the left kidney is again noted measuring up to 3.5 cm in maximum dimension with water attenuation within. No nephrolithiasis or hydroureteronephrosis. The urinary bladder is physiologically distended without bold mural thickening, calculus or focal mass. Stomach/Bowel: Stomach is within normal limits. Appendix appears normal. No evidence of bowel wall thickening, distention, or inflammatory changes. Vascular/Lymphatic: No enlarged abdominal or pelvic lymph nodes. Extensive atherosclerosis of the aorta and its branch vessels. No  aneurysm. Reproductive: Normal size prostate with central zone calcifications. Unremarkable seminal vesicles. Other: No abdominopelvic ascites.  No free air. Musculoskeletal: Left hip arthroplasty. Schmorl's node off the inferior endplate of L2 is unchanged. Degenerative disc disease L5-S1 with moderate disc space narrowing. No acute nor suspicious osseous abnormalities. IMPRESSION: 1. Further mild enlargement of previously described anterior left renal mass currently estimated at 5.6 x 4.8 cm versus 5.1 x 4.7 cm previously. Stable partially calcified bilateral renal lesions measuring up to 1.3 cm in the upper pole the right kidney and 2.3 cm in the upper pole the left  kidney. Stable complex and simple cysts of the left kidney. 2. No acute solid nor hollow visceral organ injury. No laceration or hematoma. 3. Chronic degenerative change of the lumbar spine. No acute nor suspicious osseous lesions. 4. Stable cysts or hemangiomata of the liver. 5. Stable cardiomegaly with coronary arteriosclerosis. Electronically Signed   By: Ashley Royalty M.D.   On: 02/07/2017 20:27   Ct Head Code Stroke Wo Contrast  Result Date: 02/14/2017 CLINICAL DATA:  Code stroke. Altered mental status, speech difficulties. History of migraine, stroke, atrial fibrillation. EXAM: CT HEAD WITHOUT CONTRAST TECHNIQUE: Contiguous axial images were obtained from the base of the skull through the vertex without intravenous contrast. COMPARISON:  CT HEAD June 22, 2016 FINDINGS: BRAIN: No intraparenchymal hemorrhage, mass effect nor midline shift. The ventricles and sulci are normal for age. Patchy to confluent supratentorial white matter hypodensities. New age indeterminate RIGHT basal ganglia lacunar infarct Old RIGHT thalamus cystic old small RIGHT cerebellar infarct. Next item moderate to cysts Infarct though, new from January 2018. Old RIGHT superior cerebellar small infarct. No acute large vascular territory infarcts. No abnormal extra-axial fluid collections. Basal cisterns are patent. VASCULAR: Moderate calcific atherosclerosis of the carotid siphons. Calcified LEFT MCA, unchanged. Dolichoectatic intracranial vessels seen with chronic hypertension. SKULL: No skull fracture. Subcentimeter mildly expansile probable epidermal inclusion cyst LEFT temporal bone. No significant scalp soft tissue swelling. SINUSES/ORBITS: The mastoid air-cells and included paranasal sinuses are well-aerated.The included ocular globes and orbital contents are non-suspicious. OTHER: None. ASPECTS ([place] Stroke Program Early CT Score) - Ganglionic level infarction (caudate, lentiform nuclei, internal capsule, insula, M1-M3 cortex):  7 - Supraganglionic infarction (M4-M6 cortex): 3 Total score (0-10 with 10 being normal): 10 IMPRESSION: 1. New age indeterminate RIGHT basal ganglia lacunar infarct. 2. New nonacute RIGHT thalamus infarct. 3. Old small RIGHT cerebellar infarct. Stable moderate to severe chronic small vessel ischemic disease. 4. ASPECTS is 10. 5. Critical Value/emergent results were called by telephone at the time of interpretation on 02/14/2017 at 6:30 pm to Dr. Fredia Sorrow , who verbally acknowledged these results. Electronically Signed   By: Elon Alas M.D.   On: 02/14/2017 18:32    Assessment/Plan  #1-continued hematuria-now with clots-nursing staff has contacted urology and urology has recommended he go to the ER for flushing-and they will see him tomorrow morning-at this point will continue theEliquist at reduced dose-and await urology opinion tomorrow.  We also have lab work CBC scheduled for Thursday to keep benign on his hemoglobin which again has been stable per lab done yesterday.  Also continue to encourage fluids and encourage elevation of the right hand.  This plan was discussed with Dr. Lyndel Safe  I did discuss  challenging situation with his son in the room today again the risk of continued hematuria versus the risk of stroke if he is taken off anticoagulation--family has expressed understanding-again will await urology consultation  #2 right hand  edema-orders have been written to encourage elevation but is persisting and an x-ray was done earlier today we are awaiting those results he does not appear to be having significant pain here again will await x-ray findings    [id number]-of note greater than 25 minutes spent assessing patient-discussing his status with nursing staff as well as with his son in the room-reviewing his chart-and coordinating plan of care.

## 2017-03-02 NOTE — ED Provider Notes (Addendum)
[facility] DEPT Provider Note   CSN: [id number] Arrival date & time: 03/02/17  1547     History   Chief Complaint Chief Complaint  Patient presents with  . Hematuria    HPI [facility] is a 83 y.o. male.  HPI LEVEL 5 CAVEAT FOR ALTERED DUE TO STROKE Patient with history of A. fib on eliquis, recent stroke, hypertension comes in with chief complaint of bloody urine. Patient had gross hematuria just a few days ago, was seen by urologist and started on antibiotics. Once the hematuria cleared the Foley catheter was removed. Patient currently residing at [facility]. Per family once the Foley catheter was removed patient is urine started turning pink again, and now he is having gross hematuria with clots.  Past Medical History:  Diagnosis Date  . Afib ([place])   . Atrial fibrillation ([place])   . Cerebral infarction ([place])   . DDD (degenerative disc disease), lumbar   . Dysphagia   . Hypertension   . Hypothyroidism   . Migraine    "maybe monthly" (11/21/2015)  . Prostate cancer ([place]) ~ 1995   S/P radiation tx  . Renal mass    "slow growing something; doctors just watching it right now" (11/21/2015)  . Stroke [facility])    TIA  . Thrombocytopenia ([place])   . Thyroid disease    hypothyroid    Patient Active Problem List   Diagnosis Date Noted  . Hematuria, unspecified 02/19/2017  . CVA (cerebral vascular accident) ([place]) - left moderate sized MCA infarct and right small MCA/ACA infarct - embolic secondary to Afib off eliquis due to hematuria 02/14/2017  . 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 TIA (transient ischemic attack) 05/21/2016  . Leukopenia   . Thrombocytopenia ([place])   . Fall   . Acute blood loss anemia   . Femoral neck fracture ([place]) 11/20/2015  . Renal malignant tumor ([place]) 06/06/2015  . Long-term (current) use of anticoagulants 04/29/2015  . Subclinical hypothyroidism 03/01/2014  . Atrial fibrillation ([place]) 06/09/2013  . STRESS  FRACTURE, FOOT 12/12/2008  . CLOSED FRACTURE OF METATARSAL BONE 12/12/2008    Past Surgical History:  Procedure Laterality Date  . COLONOSCOPY  1999  . ESOPHAGOGASTRODUODENOSCOPY    . LAPAROSCOPIC CHOLECYSTECTOMY  1980s  . PROSTATE BIOPSY  ~ 1995  . TOTAL HIP ARTHROPLASTY Left 11/22/2015   Procedure: LEFT HIP HEMIARTHROPLASTY ;  Surgeon: Leandrew Koyanagi, MD;  Location: [facility];  Service: Orthopedics;  Laterality: Left;       Home Medications    Prior to Admission medications   Medication Sig Start Date End Date Taking? Authorizing Provider  acetaminophen (TYLENOL) 325 MG tablet Take 325-650 mg by mouth every 6 (six) hours as needed for mild pain or moderate pain.   Yes [provider]  amLODipine (NORVASC) 5 MG tablet Take 1 tablet (5 mg total) by mouth daily. 02/20/17  Yes Patteson, Arlan Organ, NP  apixaban (ELIQUIS) 2.5 MG TABS tablet Take 2.5 mg by mouth 2 (two) times daily.   Yes [provider]  atorvastatin (LIPITOR) 20 MG tablet Take 1 tablet (20 mg total) by mouth daily at 6 PM. 02/19/17  Yes Patteson, Arlan Organ, NP  cetirizine (ZYRTEC) 10 MG tablet Take 10 mg by mouth every evening.    Yes [provider]  ketoconazole (NIZORAL) 2 % cream Apply 1 application topically 2 (two) times daily. Apply to scrotal area   Yes [provider]  levothyroxine (SYNTHROID, LEVOTHROID) 25 MCG  tablet Take 1 tablet (25 mcg total) by mouth daily before breakfast. 02/20/17  Yes Patteson, Arlan Organ, NP  metoprolol succinate (TOPROL-XL) 25 MG 24 hr tablet Take 1 tablet (25 mg total) by mouth every evening. 02/19/17  Yes Patteson, Arlan Organ, NP  polyethylene glycol powder (GLYCOLAX/MIRALAX) powder MIX 1 CAPFUL IN 8 OZ OF WATER AND DRINK twice weekly 02/19/17  Yes Patteson, Arlan Organ, NP    Family History Family History  Problem Relation Age of Onset  . Stroke Sister        in her 27s  . CAD Neg Hx   . COPD Neg Hx   . Diabetes Neg Hx   . Heart disease Neg Hx     Social  History Social History  Substance Use Topics  . Smoking status: Former Research scientist (life sciences)  . Smokeless tobacco: Never Used     Comment: 11/21/2015 "might have smoked 2 packs of cigarettes in my lifetime"  . Alcohol use Yes     Comment: 11/21/2015 "last beer I've had was in ~ 2007"     Allergies   Xanax [alprazolam] and Tape   Review of Systems Review of Systems  Unable to perform ROS: Dementia  Genitourinary: Positive for hematuria.  Hematological: Bruises/bleeds easily.     Physical Exam Updated Vital Signs BP 120/66 (BP Location: Right Arm)   Pulse 70   Resp 19   SpO2 98%   Physical Exam  Constitutional: He appears well-developed.  HENT:  Head: Atraumatic.  Neck: Neck supple.  Cardiovascular: Normal rate.   Pulmonary/Chest: Effort normal.  Abdominal: Soft. There is no tenderness.  Skin: Skin is warm.  Nursing note and vitals reviewed.    ED Treatments / Results  Labs (all labs ordered are listed, but only abnormal results are displayed) Labs Reviewed  URINALYSIS, ROUTINE W REFLEX MICROSCOPIC - Abnormal; Notable for the following:       Result Value   Color, Urine RED (*)    APPearance HAZY (*)    Glucose, UA 100 (*)    Hgb urine dipstick LARGE (*)    Ketones, ur 15 (*)    Protein, ur >300 (*)    Nitrite POSITIVE (*)    Leukocytes, UA MODERATE (*)    All other components within normal limits  URINALYSIS, MICROSCOPIC (REFLEX) - Abnormal; Notable for the following:    Bacteria, UA MANY (*)    Squamous Epithelial / LPF 0-5 (*)    All other components within normal limits  CBC - Abnormal; Notable for the following:    RBC 3.63 (*)    Hemoglobin 11.5 (*)    HCT 34.4 (*)    All other components within normal limits  BASIC METABOLIC PANEL - Abnormal; Notable for the following:    Glucose, Bld 102 (*)    BUN 24 (*)    Calcium 8.6 (*)    All other components within normal limits  URINE CULTURE    EKG  EKG Interpretation None       Radiology No results  found.  Procedures Procedures (including critical care time)  Medications Ordered in ED Medications  lidocaine (XYLOCAINE) 2 % jelly (not administered)     Initial Impression / Assessment and Plan / ED Course  I have reviewed the triage vital signs and the nursing notes.  Pertinent labs & imaging results that were available during my care of the patient were reviewed by me and considered in my medical decision making (see chart for details).  Clinical  Course as of Mar 03 2055  Tue Mar 02, 2017  1959 Results from the ER workup discussed with the patient face to face and all questions answered to the best of my ability.  Family advised to have patient see the urologist tomorrow. They're been asked to request urologist to review the urinalysis and decide on antibiotics and also on the anticoagulation continuation.    [AN]    Clinical Course User Index [AN] Varney Biles, MD    Patient comes into the ER for chief complaint of gross hematuria. Patient is on ELIQUIS secondary to A. fib. He had a recent bout with gross hematuria, patient was treated for cystitis and sent home with Foley catheter at that time.   Patient not able to provide meaningful history. Urine analysis today shows nitrite positive urine with many bacteria and no WBCs. I am not 100% sure that the symptoms are due to cystitis, especially given since he just finished a dose of antibiotics within the last week. [facility] has scheduled an appointment with urologist for tomorrow morning. I believe it will be better to put in a Foley catheter and irrigate the bladder and have urologist evaluate the patient and decide on antibiotics versus cystoscopy.  Results from the ER workup discussed with the patient face to face and all questions answered to the best of my ability. They agree with the plan to hold antibiotics by 1 day. Urine cultures have been sent.  Final Clinical Impressions(s) / ED Diagnoses   Final  diagnoses:  Gross hematuria    New Prescriptions New Prescriptions   No medications on file     Varney Biles, MD 03/02/17 [place], [place], MD 03/02/17 2006    Varney Biles, MD 03/02/17 2051    Varney Biles, MD 03/02/17 2057

## 2017-03-03 ENCOUNTER — Telehealth: Payer: Self-pay | Admitting: Family Medicine

## 2017-03-03 DIAGNOSIS — N3040 Irradiation cystitis without hematuria: Secondary | ICD-10-CM | POA: Diagnosis not present

## 2017-03-03 DIAGNOSIS — R310 Gross hematuria: Secondary | ICD-10-CM | POA: Diagnosis not present

## 2017-03-03 NOTE — Telephone Encounter (Signed)
Pt information from urologist in red folder in Dr. Gabriel Carina.

## 2017-03-04 ENCOUNTER — Other Ambulatory Visit (HOSPITAL_COMMUNITY)
Admission: RE | Admit: 2017-03-04 | Discharge: 2017-03-04 | Disposition: A | Discharge status: Home / Self Care | Payer: Medicare Other | Source: Skilled Nursing Facility | Attending: Internal Medicine | Admitting: Internal Medicine

## 2017-03-04 DIAGNOSIS — I10 Essential (primary) hypertension: Secondary | ICD-10-CM | POA: Insufficient documentation

## 2017-03-04 LAB — CBC WITH DIFFERENTIAL/PLATELET
BASOS PCT: 0 %
Basophils Absolute: 0 10*3/uL (ref 0.0–0.1)
EOS PCT: 3 %
Eosinophils Absolute: 0.1 10*3/uL (ref 0.0–0.7)
HCT: 34.7 % — ABNORMAL LOW (ref 39.0–52.0)
Hemoglobin: 11.6 g/dL — ABNORMAL LOW (ref 13.0–17.0)
LYMPHS ABS: 0.9 10*3/uL (ref 0.7–4.0)
Lymphocytes Relative: 19 %
MCH: 31.9 pg (ref 26.0–34.0)
MCHC: 33.4 g/dL (ref 30.0–36.0)
MCV: 95.3 fL (ref 78.0–100.0)
MONO ABS: 0.8 10*3/uL (ref 0.1–1.0)
MONOS PCT: 16 %
Neutro Abs: 3 10*3/uL (ref 1.7–7.7)
Neutrophils Relative %: 62 %
PLATELETS: 160 10*3/uL (ref 150–400)
RBC: 3.64 MIL/uL — ABNORMAL LOW (ref 4.22–5.81)
RDW: 13.6 % (ref 11.5–15.5)
WBC: 4.8 10*3/uL (ref 4.0–10.5)

## 2017-03-04 LAB — BASIC METABOLIC PANEL
Anion gap: 8 (ref 5–15)
BUN: 19 mg/dL (ref 6–20)
CALCIUM: 8.9 mg/dL (ref 8.9–10.3)
CHLORIDE: 101 mmol/L (ref 101–111)
CO2: 27 mmol/L (ref 22–32)
CREATININE: 1 mg/dL (ref 0.61–1.24)
GFR calc non Af Amer: >60 mL/min (ref >60)
Glucose, Bld: 93 mg/dL (ref 65–99)
Potassium: 3.8 mmol/L (ref 3.5–5.1)
Sodium: 136 mmol/L (ref 135–145)

## 2017-03-05 LAB — URINE CULTURE

## 2017-03-06 ENCOUNTER — Telehealth: Payer: Self-pay

## 2017-03-06 NOTE — Telephone Encounter (Signed)
No treatment for UC  ED 10/9.18 per Shelly Rubenstein Pharm D

## 2017-03-08 NOTE — Telephone Encounter (Signed)
I reviewed over this thank you

## 2017-03-10 ENCOUNTER — Encounter: Payer: Self-pay | Admitting: Internal Medicine

## 2017-03-10 ENCOUNTER — Non-Acute Institutional Stay (SKILLED_NURSING_FACILITY): Payer: Medicare Other | Admitting: Internal Medicine

## 2017-03-10 ENCOUNTER — Encounter (HOSPITAL_COMMUNITY): Payer: Self-pay | Admitting: Emergency Medicine

## 2017-03-10 ENCOUNTER — Inpatient Hospital Stay
Admission: RE | Admit: 2017-03-10 | Discharge: 2017-04-03 | Disposition: A | Discharge status: Home / Self Care | Payer: Medicare Other | Source: Ambulatory Visit | Attending: Internal Medicine | Admitting: Internal Medicine

## 2017-03-10 ENCOUNTER — Emergency Department (HOSPITAL_COMMUNITY)
Admission: EM | Admit: 2017-03-10 | Discharge: 2017-03-10 | Disposition: A | Discharge status: Home / Self Care | Payer: Medicare Other | Attending: Emergency Medicine | Admitting: Emergency Medicine

## 2017-03-10 DIAGNOSIS — R4182 Altered mental status, unspecified: Secondary | ICD-10-CM | POA: Insufficient documentation

## 2017-03-10 DIAGNOSIS — R310 Gross hematuria: Secondary | ICD-10-CM | POA: Insufficient documentation

## 2017-03-10 DIAGNOSIS — Z7901 Long term (current) use of anticoagulants: Secondary | ICD-10-CM | POA: Insufficient documentation

## 2017-03-10 DIAGNOSIS — D696 Thrombocytopenia, unspecified: Secondary | ICD-10-CM

## 2017-03-10 DIAGNOSIS — I10 Essential (primary) hypertension: Secondary | ICD-10-CM

## 2017-03-10 DIAGNOSIS — Z8673 Personal history of transient ischemic attack (TIA), and cerebral infarction without residual deficits: Secondary | ICD-10-CM | POA: Insufficient documentation

## 2017-03-10 DIAGNOSIS — I482 Chronic atrial fibrillation, unspecified: Secondary | ICD-10-CM

## 2017-03-10 DIAGNOSIS — Z79899 Other long term (current) drug therapy: Secondary | ICD-10-CM | POA: Insufficient documentation

## 2017-03-10 DIAGNOSIS — R319 Hematuria, unspecified: Secondary | ICD-10-CM | POA: Diagnosis not present

## 2017-03-10 DIAGNOSIS — Z87891 Personal history of nicotine dependence: Secondary | ICD-10-CM | POA: Insufficient documentation

## 2017-03-10 LAB — CBC WITH DIFFERENTIAL/PLATELET
BASOS ABS: 0 10*3/uL (ref 0.0–0.1)
BASOS PCT: 0 %
EOS PCT: 2 %
Eosinophils Absolute: 0.1 10*3/uL (ref 0.0–0.7)
HEMATOCRIT: 36.9 % — AB (ref 39.0–52.0)
Hemoglobin: 12.2 g/dL — ABNORMAL LOW (ref 13.0–17.0)
Lymphocytes Relative: 23 %
Lymphs Abs: 1 10*3/uL (ref 0.7–4.0)
MCH: 31.2 pg (ref 26.0–34.0)
MCHC: 33.1 g/dL (ref 30.0–36.0)
MCV: 94.4 fL (ref 78.0–100.0)
MONO ABS: 0.5 10*3/uL (ref 0.1–1.0)
MONOS PCT: 12 %
NEUTROS ABS: 2.6 10*3/uL (ref 1.7–7.7)
Neutrophils Relative %: 63 %
PLATELETS: 184 10*3/uL (ref 150–400)
RBC: 3.91 MIL/uL — ABNORMAL LOW (ref 4.22–5.81)
RDW: 13.8 % (ref 11.5–15.5)
WBC: 4.2 10*3/uL (ref 4.0–10.5)

## 2017-03-10 LAB — BASIC METABOLIC PANEL
Anion gap: 9 (ref 5–15)
BUN: 18 mg/dL (ref 6–20)
CALCIUM: 9.4 mg/dL (ref 8.9–10.3)
CO2: 25 mmol/L (ref 22–32)
CREATININE: 0.89 mg/dL (ref 0.61–1.24)
Chloride: 105 mmol/L (ref 101–111)
GLUCOSE: 104 mg/dL — AB (ref 65–99)
Potassium: 4.3 mmol/L (ref 3.5–5.1)
Sodium: 139 mmol/L (ref 135–145)

## 2017-03-10 LAB — URINALYSIS, ROUTINE W REFLEX MICROSCOPIC

## 2017-03-10 LAB — URINALYSIS, MICROSCOPIC (REFLEX)
Squamous Epithelial / LPF: NONE SEEN
WBC, UA: NONE SEEN WBC/hpf (ref 0–5)

## 2017-03-10 MED ORDER — LIDOCAINE HCL 2 % EX GEL
CUTANEOUS | Status: AC
  Filled 2017-03-10: qty 10

## 2017-03-10 NOTE — ED Triage Notes (Signed)
PT from [facility] for rehab s/p stroke x3 weeks ago and takes Eliquis. PT family reports lower abdomen discomfort with blood in urine x2 days.

## 2017-03-10 NOTE — ED Provider Notes (Signed)
83 year old man history of hematuria on eliquis presents today complaining of lower abdominal discomfort and hemaia. Foley is in place. Plan recheck of hemoglobin. Irrigate Foley and will consult urology. Vitals:   03/10/17 0812  BP: (!) 183/69  Pulse: 70  Resp: 18  Temp: 98.1 F (36.7 C)  SpO2: 97%   Elderly male in bed in nad Abdomen soft and nttp Foley in place   I performed a history and physical examination of Darl Pikes and discussed his management with Mr. Rona Ravens.  I agree with the history, physical, assessment, and plan of care, with the following exceptions: None  I was present for the following procedures: None Time Spent in Critical Care of the patient: None Time spent in discussions with the patient and family: 10  Beecher Furio Shaune Pollack, MD 03/10/17 1630

## 2017-03-10 NOTE — ED Provider Notes (Signed)
[facility]MENT Provider Note   CSN: [id number] Arrival date & time: 03/10/17  0803     History   Chief Complaint Chief Complaint  Patient presents with  . Hematuria    HPI [facility] is a 83 y.o. male.  HPI   83 year old male with history of atrial fibrillation currently on Eliquis, recent stroke with subsequent altered mental status, prostate cancer, hypertension, thyroid disease, renal mass present for evaluation of hematuria. Patient developed gross hematuria 2 weeks ago and was initially seen at the urologist and started on antibiotic.he had a Foley catheter placed and once hematuria resolve the Foley was removed. His hematuria resumed with clots and patient was seen in the ER on 03/02/17. Patient was scheduled to follow with the urologist. Patient did follow-up with Dr. Jeffie Pollock from [facility]. Cystoscopy was performed and source of the bleeding was identified as several leaking blood vessels within the bladder. Patient has a cauterization procedure and bleeding stopped. However, the staff at his facility noticed blood-tinged colored urine in the Foley bag yesterday which has increasing concentration today and patient brought here for further evaluation. No other complaint reported. Level V caveats due to AMS 2/2 stroke.  Hx obtained through notes and through family member at bedside.   Past Medical History:  Diagnosis Date  . Afib ([place])   . Atrial fibrillation ([place] [facility])   . Cerebral infarction ([place])   . DDD (degenerative disc disease), lumbar   . Dysphagia   . Hypertension   . Hypothyroidism   . Migraine    "maybe monthly" (11/21/2015)  . Prostate cancer ([place]) ~ 1995   S/P radiation tx  . Renal mass    "slow growing something; doctors just watching it right now" (11/21/2015)  . Stroke [facility])    TIA  . Thrombocytopenia ([place])   . Thyroid disease    hypothyroid    Patient Active Problem List   Diagnosis Date Noted  . Hematuria, unspecified  02/19/2017  . CVA (cerebral vascular accident) ([place]) - left moderate sized MCA infarct and right small MCA/ACA infarct - embolic secondary to Afib off eliquis due to hematuria 02/14/2017  . 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 TIA (transient ischemic attack) 05/21/2016  . Leukopenia   . Thrombocytopenia ([place])   . Fall   . Acute blood loss anemia   . Femoral neck fracture ([place]) 11/20/2015  . Renal malignant tumor ([place]) 06/06/2015  . Long-term (current) use of anticoagulants 04/29/2015  . Subclinical hypothyroidism 03/01/2014  . Atrial fibrillation ([place]) 06/09/2013  . STRESS FRACTURE, FOOT 12/12/2008  . CLOSED FRACTURE OF METATARSAL BONE 12/12/2008    Past Surgical History:  Procedure Laterality Date  . COLONOSCOPY  1999  . ESOPHAGOGASTRODUODENOSCOPY    . LAPAROSCOPIC CHOLECYSTECTOMY  1980s  . PROSTATE BIOPSY  ~ 1995  . TOTAL HIP ARTHROPLASTY Left 11/22/2015   Procedure: LEFT HIP HEMIARTHROPLASTY ;  Surgeon: Leandrew Koyanagi, MD;  Location: [facility];  Service: Orthopedics;  Laterality: Left;       Home Medications    Prior to Admission medications   Medication Sig Start Date End Date Taking? Authorizing Provider  acetaminophen (TYLENOL) 325 MG tablet Take 325-650 mg by mouth every 6 (six) hours as needed for mild pain or moderate pain.    [provider]  amLODipine (NORVASC) 5 MG tablet Take 1 tablet (5 mg total) by mouth daily. 02/20/17   Patteson, Arlan Organ, NP  apixaban (ELIQUIS) 2.5 MG TABS tablet Take  2.5 mg by mouth 2 (two) times daily.    [provider]  atorvastatin (LIPITOR) 20 MG tablet Take 1 tablet (20 mg total) by mouth daily at 6 PM. 02/19/17   Patteson, Arlan Organ, NP  cetirizine (ZYRTEC) 10 MG tablet Take 10 mg by mouth every evening.     [provider]  ketoconazole (NIZORAL) 2 % cream Apply 1 application topically 2 (two) times daily. Apply to scrotal area    [provider]  levothyroxine (SYNTHROID,  LEVOTHROID) 25 MCG tablet Take 1 tablet (25 mcg total) by mouth daily before breakfast. 02/20/17   Patteson, Arlan Organ, NP  metoprolol succinate (TOPROL-XL) 25 MG 24 hr tablet Take 1 tablet (25 mg total) by mouth every evening. 02/19/17   Patteson, Arlan Organ, NP  polyethylene glycol powder (GLYCOLAX/MIRALAX) powder MIX 1 CAPFUL IN 8 OZ OF WATER AND DRINK twice weekly 02/19/17   Charm Rings, NP    Family History Family History  Problem Relation Age of Onset  . Stroke Sister        in her 78s  . CAD Neg Hx   . COPD Neg Hx   . Diabetes Neg Hx   . Heart disease Neg Hx     Social History Social History  Substance Use Topics  . Smoking status: Former Research scientist (life sciences)  . Smokeless tobacco: Never Used     Comment: 11/21/2015 "might have smoked 2 packs of cigarettes in my lifetime"  . Alcohol use Yes     Comment: 11/21/2015 "last beer I've had was in ~ 2007"     Allergies   Xanax [alprazolam] and Tape   Review of Systems Review of Systems  Unable to perform ROS: Mental status change     Physical Exam Updated Vital Signs BP (!) 183/69 (BP Location: Left Arm)   Pulse 70   Temp 98.1 F (36.7 C) (Oral)   Resp 18   Ht 5\' 10"  (1.778 m)   Wt 73 kg (161 lb)   SpO2 97%   BMI 23.10 kg/m   Physical Exam  Constitutional: He appears well-developed and well-nourished. No distress.  HENT:  Head: Atraumatic.  Eyes: Conjunctivae are normal.  Neck: Neck supple.  Genitourinary:  Genitourinary Comments: Indwelling Foley catheter in place, gross blood noted in Foley bag.  Neurological: He is alert.  Skin: No rash noted.  Psychiatric: He has a normal mood and affect.  Nursing note and vitals reviewed.    ED Treatments / Results  Labs (all labs ordered are listed, but only abnormal results are displayed) Labs Reviewed  URINALYSIS, ROUTINE W REFLEX MICROSCOPIC - Abnormal; Notable for the following:       Result Value   Color, Urine RED (*)    APPearance TURBID (*)    Glucose, UA   (*)      Value: TEST NOT REPORTED DUE TO COLOR INTERFERENCE OF URINE PIGMENT   Hgb urine dipstick   (*)    Value: TEST NOT REPORTED DUE TO COLOR INTERFERENCE OF URINE PIGMENT   Bilirubin Urine   (*)    Value: TEST NOT REPORTED DUE TO COLOR INTERFERENCE OF URINE PIGMENT   Ketones, ur   (*)    Value: TEST NOT REPORTED DUE TO COLOR INTERFERENCE OF URINE PIGMENT   Protein, ur   (*)    Value: TEST NOT REPORTED DUE TO COLOR INTERFERENCE OF URINE PIGMENT   Nitrite   (*)    Value: TEST NOT REPORTED DUE TO COLOR INTERFERENCE  OF URINE PIGMENT   Leukocytes, UA   (*)    Value: TEST NOT REPORTED DUE TO COLOR INTERFERENCE OF URINE PIGMENT   All other components within normal limits  CBC WITH DIFFERENTIAL/PLATELET - Abnormal; Notable for the following:    RBC 3.91 (*)    Hemoglobin 12.2 (*)    HCT 36.9 (*)    All other components within normal limits  BASIC METABOLIC PANEL - Abnormal; Notable for the following:    Glucose, Bld 104 (*)    All other components within normal limits  URINALYSIS, MICROSCOPIC (REFLEX) - Abnormal; Notable for the following:    Bacteria, UA MANY (*)    All other components within normal limits    EKG  EKG Interpretation None       Radiology No results found.  Procedures Procedures (including critical care time)  Medications Ordered in ED Medications - No data to display   Initial Impression / Assessment and Plan / ED Course  I have reviewed the triage vital signs and the nursing notes.  Pertinent labs & imaging results that were available during my care of the patient were reviewed by me and considered in my medical decision making (see chart for details).     BP (!) 147/76   Pulse 69   Temp 98.1 F (36.7 C) (Oral)   Resp 19   Ht 5\' 10"  (1.778 m)   Wt 73 kg (161 lb)   SpO2 95%   BMI 23.10 kg/m    Final Clinical Impressions(s) / ED Diagnoses   Final diagnoses:  Gross hematuria    New Prescriptions New Prescriptions   No medications on file    9:54 AM Patient here for evaluation of gross hematuria. He had a cystoscopy procedure done recently that shows evidence of bleeding vessels within his bladder which was subsequently cauterized. He was symptom free for approximately 1 week. He is currently being again. He will need to be managed by urologist for further management. Care discussed with Dr. Jeanell Sparrow. We'll begin bladder irrigation while he is in the ER, and I will check his H&H.  12:15 PM Pt is hemodynamically stable.  Hgb unchanged.  Appreciate consultation with oncall urologist who recommend pt to f/u in office in 3 days with Dr. Jeffie Pollock for further care.  Return precaution given.  Bladder has been irrigated.     Domenic Moras, PA-C 03/10/17 1216    Pattricia Boss, MD 03/10/17 [phone]

## 2017-03-10 NOTE — Progress Notes (Signed)
This encounter was created in error - please disregard.

## 2017-03-10 NOTE — ED Notes (Signed)
CBI stopped, foley catheter in place at discharge.  No clots noted in urine but is moderately pink in color ( fruit punch in color)

## 2017-03-10 NOTE — Progress Notes (Signed)
Location:   [facility] Room Number: 132/P Place of Service:  SNF (31) Provider:  Ewell Poe, MD  Patient Care Team: Kathyrn Drown, MD as PCP - General (Family Medicine) Herminio Commons, MD as Attending Physician (Cardiology)  Extended Emergency Contact Information Primary Emergency Contact: Maeola Sarah of [place] Mobile Phone: [phone] Relation: Daughter Secondary Emergency Contact: Pegram,[name] Address: [address]          [place], [place] [address] Johnnette Litter of [place] Phone: [phone] Work Phone: [phone] Mobile Phone: [phone] Relation: Daughter  Code Status:  DNR Goals of care: Advanced Directive information Advanced Directives 03/10/2017  Does Patient Have a Medical Advance Directive? Yes  Type of Advance Directive Out of facility DNR (pink MOST or yellow form)  Does patient want to make changes to medical advance directive? No - Patient declined  Copy of [place] in Chart? No - copy requested  Would patient like information on creating a medical advance directive? No - Patient declined     Chief Complaint  Patient presents with  . Acute Visit    F/U for Hematuria    HPI:  Pt is a 83 y.o. male seen today for an acute visit for  Follow-up of hematuria.  He does have a history of this.  He has a history of atrial fibrillation and is on low-dose Eliquis this was reduced down did-2.5 mg twice a day because of concerns of hematuria.  This has been challenging keeping him on anticoagulation however this is complicated with a history of stroke that was thought to be possibly embolic.  Regards to hematuria he developed this about 2 weeks ago and was followed by urology and did complete antibiotic.  actually catheter placed ands hematuria resolved Foley was removed-but hematuria resumed and he actually went to the ER on 03/02/2017.  Follow-up was scheduled  with urology and a cystoscopy was performed and source of bleeding was identified as several leaking blood vessels in the bladder.  He had a cauterization procedure and the bleeding resolved.  However this morning nursing noted that he was having hematuria gad-apparently this started yesterday did increase this morning.  He was brought to the ER earlier today for evaluation  There he did receive bladder irrigation in the ER and hemoglobin was checked which was stable at 12.2.  .  Urology was consulted who recommended follow-up in urology office on Friday.  In ER patient was found to remain hemodynamically stable hemoglobin again was stable clinical status appeared to be stable and he has returned to the facility.  Since returning the facility he continues to have some hematuria but appears to be stable hemodynamically blood pressure is mildly elevated at 160/70 however he is somewhat agitated with exam which I suspect is contributing to this and actually seen the ER his systolic blood pressure was listed in the 180s-I suspect the excitement of the day has probably contributed to this  In addition to the atrial fibrillation and recurrent stroke patient does have a history of a renal mass-as well as hypertension hypothyroidism hyperlipidemia and a history of prostate cancer status post radiation chemotherapy.  He also has chronic thrombocytopenia and leukopenia however lab in ER showed a white count of 4.2 and platelets of 184.  Regards hypertension he is on Norvasc 5 mg a day and Lopressor 25 25 mg daily at bedtime.--Blood pressure earlier today before going to the ER was 132/71      .  Marland Kitchen  Past Medical History:  Diagnosis Date  . Afib ([place])   . Atrial fibrillation ([place])   . Cerebral infarction ([place])   . DDD (degenerative disc disease), lumbar   . Dysphagia   . Hypertension   . Hypothyroidism   . Migraine    "maybe monthly" (11/21/2015)  . Prostate cancer ([place]) ~  1995   S/P radiation tx  . Renal mass    "slow growing something; doctors just watching it right now" (11/21/2015)  . Stroke [facility])    TIA  . Thrombocytopenia ([place])   . Thyroid disease    hypothyroid   Past Surgical History:  Procedure Laterality Date  . COLONOSCOPY  1999  . ESOPHAGOGASTRODUODENOSCOPY    . LAPAROSCOPIC CHOLECYSTECTOMY  1980s  . PROSTATE BIOPSY  ~ 1995  . TOTAL HIP ARTHROPLASTY Left 11/22/2015   Procedure: LEFT HIP HEMIARTHROPLASTY ;  Surgeon: Leandrew Koyanagi, MD;  Location: [facility];  Service: Orthopedics;  Laterality: Left;    Allergies  Allergen Reactions  . Xanax [Alprazolam] Other (See Comments)    Dizzy, Sedated  . Tape Rash    PATIENT'S SKIN WILL TEAR/PLEASE EITHER USE PAPER TAPE OR COBAN WRAP!!    Outpatient Encounter Prescriptions as of 03/10/2017  Medication Sig  . acetaminophen (TYLENOL) 325 MG tablet Take 325-650 mg by mouth every 6 (six) hours as needed for mild pain or moderate pain.  Marland Kitchen amLODipine (NORVASC) 5 MG tablet Take 1 tablet (5 mg total) by mouth daily.  Marland Kitchen apixaban (ELIQUIS) 2.5 MG TABS tablet Take 2.5 mg by mouth 2 (two) times daily.  Marland Kitchen atorvastatin (LIPITOR) 20 MG tablet Take 1 tablet (20 mg total) by mouth daily at 6 PM.  . cetirizine (ZYRTEC) 10 MG tablet Take 10 mg by mouth every evening.   Marland Kitchen levothyroxine (SYNTHROID, LEVOTHROID) 25 MCG tablet Take 1 tablet (25 mcg total) by mouth daily before breakfast.  . metoprolol succinate (TOPROL-XL) 25 MG 24 hr tablet Take 1 tablet (25 mg total) by mouth every evening.  . polyethylene glycol powder (GLYCOLAX/MIRALAX) powder MIX 1 CAPFUL IN 8 OZ OF WATER AND DRINK twice weekly   Facility-Administered Encounter Medications as of 03/10/2017  Medication  . lidocaine (XYLOCAINE) 2 % jelly    Review of Systems   Review of systems is largely unattainable secondary patient not speaking much he is somewhat agitated with exam-he did appear to eat a fairly good lunch when he returned from the ER he does not  appear to be in any distress  Immunization History  Administered Date(s) Administered  . Influenza Split 02/28/2013  . Influenza, High Dose Seasonal PF 02/19/2017  . Influenza,inj,Quad PF,6+ Mos 03/01/2014, 02/18/2015, 02/26/2016  . Influenza-Unspecified 01/24/2012  . PPD Test 11/25/2015  . Pneumococcal Conjugate-13 03/01/2014  . Pneumococcal Polysaccharide-23 01/24/2012  . Td 12/13/2007  . Zoster 09/23/2007   Pertinent  Health Maintenance Due  Topic Date Due  . INFLUENZA VACCINE  Completed  . PNA vac Low Risk Adult  Completed   Fall Risk  07/08/2016 11/09/2013  Falls in the past year? No No   Functional Status Survey:    Vitals:   03/10/17 1436  BP: 132/71  Pulse: 75  He is afebrile respirations are 19  Physical Exam  In general this is a somewhat frail elderly male in no distress lying comfortably in bed he is somewhat agitated however with exam.  His skin is warm and dry he does have covering over his right hand which apparently has had a skin tear.  Eyes visual acuity appears grossly intact sclera and conjunctiva are clear.  Chest is clear to auscultation with somewhat poor effort there is no labored breathing.  Heart is irregular irregular rate and rhythm without murmur gallop or rub does not appear to have significant lower extremity edema I would say trace bilaterally.  Abdomen is soft does not appear to be tender there are positive bowel sounds.  GU he does have a Foley catheter draining blood-tinged urine I do not see any active drainage or bleeding from the penile opening. Difficult to tell if there is suprapubic tenderness secondary to agitation secondary to invasive maneuver  Musculoskeletal is able to move all extremities actually is able to transfer with assistance from wheelchair to the bed  Neurologic again cannot really appreciate any true focal deficits although this was difficult to fully assess secondary to agitation-he is relatively a  phasic.  Psych again he is somewhat agitated with exam otherwise is call does follow simple verbal commands with prompting.     Labs reviewed:  Recent Labs  03/02/17 1741 03/04/17 1000 03/10/17 1001  NA 137 136 139  K 4.3 3.8 4.3  CL 104 101 105  CO2 24 27 25   GLUCOSE 102* 93 104*  BUN 24* 19 18  CREATININE 0.95 1.00 0.89  CALCIUM 8.6* 8.9 9.4    Recent Labs  02/14/17 1825 02/25/17 1445 03/01/17 0630  AST 23 25 19   ALT 13* 17 14*  ALKPHOS 59 58 58  BILITOT 0.9 1.1 1.3*  PROT 7.3 6.8 6.2*  ALBUMIN 4.0 3.8 3.5    Recent Labs  02/20/17 0730  03/02/17 1741 03/04/17 1000 03/10/17 1001  WBC 5.2  < > 4.9 4.8 4.2  NEUTROABS 3.3  --   --  3.0 2.6  HGB 13.5  < > 11.5* 11.6* 12.2*  HCT 39.2  < > 34.4* 34.7* 36.9*  MCV 91.6  < > 94.8 95.3 94.4  PLT 176  < > 163 160 184  < > = values in this interval not displayed. Lab Results  Component Value Date   TSH 3.915 03/01/2017   Lab Results  Component Value Date   HGBA1C 5.4 02/15/2017   Lab Results  Component Value Date   CHOL 150 02/15/2017   HDL 43 02/15/2017   LDLCALC 96 02/15/2017   TRIG 53 02/15/2017   CHOLHDL 3.5 02/15/2017    Significant Diagnostic Results in last 30 days:  Ct Angio Head W/cm &/or Wo Cm  Result Date: 02/14/2017 CLINICAL DATA:  Stroke.  TPA administered EXAM: CT ANGIOGRAPHY HEAD AND NECK TECHNIQUE: Multidetector CT imaging of the head and neck was performed using the standard protocol during bolus administration of intravenous contrast. Multiplanar CT image reconstructions and MIPs were obtained to evaluate the vascular anatomy. Carotid stenosis measurements (when applicable) are obtained utilizing NASCET criteria, using the distal internal carotid diameter as the denominator. CONTRAST:  100 mL Isovue 370 IV COMPARISON:  CT head 02/14/2017 FINDINGS: CTA NECK FINDINGS Aortic arch: Atherosclerotic disease in the aortic arch and proximal great vessels. Two vessel branching arch. Atherosclerotic  calcification proximal great vessels without significant stenosis. Right carotid system: Right common carotid artery mildly diseased without significant stenosis. Atherosclerotic calcification right carotid bifurcation without significant stenosis. Left carotid system: Left common carotid artery widely patent. Atherosclerotic calcification left carotid bifurcation without significant stenosis. Vertebral arteries: Right vertebral artery dominant. Right vertebral artery widely patent to the basilar without significant stenosis. Nondominant left vertebral artery also widely patent without significant stenosis.  Skeleton: Cervical spine degenerative change. No acute skeletal abnormality. Other neck: Negative for mass or adenopathy Upper chest: Negative Review of the MIP images confirms the above findings CTA HEAD FINDINGS Anterior circulation: Mild atherosclerotic calcification cavernous carotid bilaterally without significant stenosis. Mild atherosclerotic disease M1 segment bilaterally. Atherosclerotic irregularity in the M2 branches bilaterally. No large vessel occlusion. Posterior circulation: Both vertebral arteries patent to the basilar. PICA patent bilaterally. Basilar widely patent. Mild ectasia of the basilar with tortuosity. Superior cerebellar arteries patent bilaterally. Moderate stenosis P2 segment bilaterally. Venous sinuses: Patent Anatomic variants: None Delayed phase: Normal enhancement on delayed imaging. Atrophy with diffuse small vessel ischemia. Review of the MIP images confirms the above findings IMPRESSION: No significant carotid or vertebral artery stenosis in the neck Diffuse intracranial atherosclerotic disease including the middle cerebral arteries bilaterally. Moderate stenosis P2 segment bilaterally in the posterior cerebral arteries. No intracranial large vessel occlusion. No intracranial hemorrhage identified. Electronically Signed   By: Franchot Gallo M.D.   On: 02/14/2017 19:55   Ct  Head Wo Contrast  Result Date: 02/15/2017 CLINICAL DATA:  Followup stroke, 24 hours after tPA. History of hypertension, prostate cancer, stroke. EXAM: CT HEAD WITHOUT CONTRAST TECHNIQUE: Contiguous axial images were obtained from the base of the skull through the vertex without intravenous contrast. COMPARISON:  CT HEAD February 14, 2017 FINDINGS: BRAIN: Acute LEFT frontoparietal cytotoxic edema extending to the insula and operculum, new from prior CT. No intraparenchymal hemorrhage. Old small RIGHT cerebellar infarct. Old basal ganglia and RIGHT thalamus lacunar infarcts. Punctate RIGHT cerebellar parenchymal calcification. Patchy to confluent supratentorial white matter hypodensities. Ventricles and sulci are overall normal for patient's age. No abnormal extra-axial fluid collections. Basal cisterns are patent. VASCULAR: Dolichoectatic intracranial vessels. Moderate calcific atherosclerosis carotid siphon. Re- demonstration of calcification LEFT MCA. SKULL: No skull fracture. No significant scalp soft tissue swelling. SINUSES/ORBITS: The mastoid air-cells and included paranasal sinuses are well-aerated.The included ocular globes and orbital contents are non-suspicious. OTHER: Patient is edentulous. IMPRESSION: 1. New, acute LEFT MCA territory nonhemorrhagic infarct. 2. Old lacunar infarcts and moderate to severe chronic small vessel ischemic disease. Acute findings text paged to [facility], Neurology via AMION secure system on 02/15/2017 at 5:50 pm. Electronically Signed   By: Elon Alas M.D.   On: 02/15/2017 17:52   Ct Angio Neck W And/or Wo Contrast  Result Date: 02/14/2017 CLINICAL DATA:  Stroke.  TPA administered EXAM: CT ANGIOGRAPHY HEAD AND NECK TECHNIQUE: Multidetector CT imaging of the head and neck was performed using the standard protocol during bolus administration of intravenous contrast. Multiplanar CT image reconstructions and MIPs were obtained to evaluate the vascular anatomy.  Carotid stenosis measurements (when applicable) are obtained utilizing NASCET criteria, using the distal internal carotid diameter as the denominator. CONTRAST:  100 mL Isovue 370 IV COMPARISON:  CT head 02/14/2017 FINDINGS: CTA NECK FINDINGS Aortic arch: Atherosclerotic disease in the aortic arch and proximal great vessels. Two vessel branching arch. Atherosclerotic calcification proximal great vessels without significant stenosis. Right carotid system: Right common carotid artery mildly diseased without significant stenosis. Atherosclerotic calcification right carotid bifurcation without significant stenosis. Left carotid system: Left common carotid artery widely patent. Atherosclerotic calcification left carotid bifurcation without significant stenosis. Vertebral arteries: Right vertebral artery dominant. Right vertebral artery widely patent to the basilar without significant stenosis. Nondominant left vertebral artery also widely patent without significant stenosis. Skeleton: Cervical spine degenerative change. No acute skeletal abnormality. Other neck: Negative for mass or adenopathy Upper chest: Negative Review of the MIP images confirms  the above findings CTA HEAD FINDINGS Anterior circulation: Mild atherosclerotic calcification cavernous carotid bilaterally without significant stenosis. Mild atherosclerotic disease M1 segment bilaterally. Atherosclerotic irregularity in the M2 branches bilaterally. No large vessel occlusion. Posterior circulation: Both vertebral arteries patent to the basilar. PICA patent bilaterally. Basilar widely patent. Mild ectasia of the basilar with tortuosity. Superior cerebellar arteries patent bilaterally. Moderate stenosis P2 segment bilaterally. Venous sinuses: Patent Anatomic variants: None Delayed phase: Normal enhancement on delayed imaging. Atrophy with diffuse small vessel ischemia. Review of the MIP images confirms the above findings IMPRESSION: No significant carotid or  vertebral artery stenosis in the neck Diffuse intracranial atherosclerotic disease including the middle cerebral arteries bilaterally. Moderate stenosis P2 segment bilaterally in the posterior cerebral arteries. No intracranial large vessel occlusion. No intracranial hemorrhage identified. Electronically Signed   By: Franchot Gallo M.D.   On: 02/14/2017 19:55   Dg Chest Port 1 View  Result Date: 02/16/2017 CLINICAL DATA:  CHF EXAM: PORTABLE CHEST 1 VIEW COMPARISON:  06/02/2016 FINDINGS: Borderline cardiomegaly, accentuated by technique. Stable appearance of the hila. Low volume chest with interstitial crowding. Suspect streaky atelectasis. No pneumothorax or suspected edema. IMPRESSION: 1. Negative for pulmonary edema. 2. Low volume chest with mild atelectasis. Electronically Signed   By: Monte Fantasia M.D.   On: 02/16/2017 07:21   Mr Brain Limited Wo Contrast  Result Date: 02/16/2017 CLINICAL DATA:  Stroke EXAM: MRI HEAD WITHOUT CONTRAST TECHNIQUE: Multiplanar, multiecho pulse sequences of the brain and surrounding structures were obtained without intravenous contrast. COMPARISON:  CT head 02/15/2017 FINDINGS: Brain: Acute infarct left parietal operculum extending into the left parietal lobe as noted on CT. Small cortical infarct in the posterior right parietal lobe. No other acute infarct Susceptibility right cerebellum unchanged from prior MRI. Multiple areas of chronic microhemorrhage are seen on the prior study. Study was limited to diffusion only. IMPRESSION: IMPRESSION Moderately large acute infarct in the left parietal lobe Small acute infarct right posterior parietal lobe. Electronically Signed   By: Franchot Gallo M.D.   On: 02/16/2017 11:49   Ct Head Code Stroke Wo Contrast  Result Date: 02/14/2017 CLINICAL DATA:  Code stroke. Altered mental status, speech difficulties. History of migraine, stroke, atrial fibrillation. EXAM: CT HEAD WITHOUT CONTRAST TECHNIQUE: Contiguous axial images were  obtained from the base of the skull through the vertex without intravenous contrast. COMPARISON:  CT HEAD June 22, 2016 FINDINGS: BRAIN: No intraparenchymal hemorrhage, mass effect nor midline shift. The ventricles and sulci are normal for age. Patchy to confluent supratentorial white matter hypodensities. New age indeterminate RIGHT basal ganglia lacunar infarct Old RIGHT thalamus cystic old small RIGHT cerebellar infarct. Next item moderate to cysts Infarct though, new from January 2018. Old RIGHT superior cerebellar small infarct. No acute large vascular territory infarcts. No abnormal extra-axial fluid collections. Basal cisterns are patent. VASCULAR: Moderate calcific atherosclerosis of the carotid siphons. Calcified LEFT MCA, unchanged. Dolichoectatic intracranial vessels seen with chronic hypertension. SKULL: No skull fracture. Subcentimeter mildly expansile probable epidermal inclusion cyst LEFT temporal bone. No significant scalp soft tissue swelling. SINUSES/ORBITS: The mastoid air-cells and included paranasal sinuses are well-aerated.The included ocular globes and orbital contents are non-suspicious. OTHER: None. ASPECTS ([place] Stroke Program Early CT Score) - Ganglionic level infarction (caudate, lentiform nuclei, internal capsule, insula, M1-M3 cortex): 7 - Supraganglionic infarction (M4-M6 cortex): 3 Total score (0-10 with 10 being normal): 10 IMPRESSION: 1. New age indeterminate RIGHT basal ganglia lacunar infarct. 2. New nonacute RIGHT thalamus infarct. 3. Old small RIGHT cerebellar infarct. Stable  moderate to severe chronic small vessel ischemic disease. 4. ASPECTS is 10. 5. Critical Value/emergent results were called by telephone at the time of interpretation on 02/14/2017 at 6:30 pm to Dr. Fredia Sorrow , who verbally acknowledged these results. Electronically Signed   By: Elon Alas M.D.   On: 02/14/2017 18:32    Assessment/Plan #1 hematuria this appears to be recurrent as noted  above I have spoken with the family as well as her nursing staff and he will have urology follow-up on Friday initially there was some confusion about this but I spoke with nursing and this has been cleared up--he is status post cauterization-again will wait urology follow-up continues to have some hematuria but hemoglobin has been stable we will update a CBC first laboratory day next week this was discussed with Dr. Lyndel Safe via phone at this point with his history of A. fib and recurrent stroke will continue on low-dose Eliquis  Number 2-atrial fibrillation again this appears rate controlled on Lopressor is on Eliquis--dosed reduced secondary to hematuria.  Again this is a challenging balancing situation.  #[id number] again systolic is somewhat elevated today I suspect the excitement of the day has contributed to this as well as his agitation at this point will monitor continues on Norvasc 5 mg a day and Lopressor 25 mg at night  #4 thrombocytopenia again this appears to have stabilized with platelets of 184,000 on lab done today again will update this early next week.  [id number]

## 2017-03-10 NOTE — Discharge Instructions (Signed)
Please call Dr. Jeffie Pollock office to confirm a time to be seen on Friday for further management of blood in urine. Return if you have any concerns.

## 2017-03-12 DIAGNOSIS — R310 Gross hematuria: Secondary | ICD-10-CM | POA: Diagnosis not present

## 2017-03-15 ENCOUNTER — Encounter (HOSPITAL_COMMUNITY)
Admission: RE | Admit: 2017-03-15 | Discharge: 2017-03-15 | Disposition: A | Discharge status: Home / Self Care | Payer: Medicare Other | Source: Skilled Nursing Facility | Attending: *Deleted | Admitting: *Deleted

## 2017-03-15 LAB — BASIC METABOLIC PANEL
Anion gap: 7 (ref 5–15)
BUN: 19 mg/dL (ref 6–20)
CALCIUM: 8.8 mg/dL — AB (ref 8.9–10.3)
CO2: 26 mmol/L (ref 22–32)
CREATININE: 1 mg/dL (ref 0.61–1.24)
Chloride: 104 mmol/L (ref 101–111)
GFR calc Af Amer: >60 mL/min (ref >60)
GFR calc non Af Amer: >60 mL/min (ref >60)
GLUCOSE: 97 mg/dL (ref 65–99)
Potassium: 4 mmol/L (ref 3.5–5.1)
Sodium: 137 mmol/L (ref 135–145)

## 2017-03-15 LAB — CBC WITH DIFFERENTIAL/PLATELET
BASOS ABS: 0 10*3/uL (ref 0.0–0.1)
BASOS PCT: 0 %
Eosinophils Absolute: 0.3 10*3/uL (ref 0.0–0.7)
Eosinophils Relative: 7 %
HEMATOCRIT: 33.3 % — AB (ref 39.0–52.0)
Hemoglobin: 11.3 g/dL — ABNORMAL LOW (ref 13.0–17.0)
Lymphocytes Relative: 29 %
Lymphs Abs: 1.4 10*3/uL (ref 0.7–4.0)
MCH: 32.2 pg (ref 26.0–34.0)
MCHC: 33.9 g/dL (ref 30.0–36.0)
MCV: 94.9 fL (ref 78.0–100.0)
MONO ABS: 0.5 10*3/uL (ref 0.1–1.0)
Monocytes Relative: 11 %
NEUTROS ABS: 2.4 10*3/uL (ref 1.7–7.7)
Neutrophils Relative %: 53 %
Platelets: 176 10*3/uL (ref 150–400)
RBC: 3.51 MIL/uL — AB (ref 4.22–5.81)
RDW: 13.9 % (ref 11.5–15.5)
WBC: 4.6 10*3/uL (ref 4.0–10.5)

## 2017-03-24 ENCOUNTER — Encounter (HOSPITAL_COMMUNITY)
Admission: RE | Admit: 2017-03-24 | Discharge: 2017-03-24 | Disposition: A | Discharge status: Home / Self Care | Payer: Medicare Other | Source: Skilled Nursing Facility | Attending: Internal Medicine | Admitting: Internal Medicine

## 2017-03-24 DIAGNOSIS — N4289 Other specified disorders of prostate: Secondary | ICD-10-CM | POA: Insufficient documentation

## 2017-03-24 DIAGNOSIS — Z5189 Encounter for other specified aftercare: Secondary | ICD-10-CM | POA: Insufficient documentation

## 2017-03-24 DIAGNOSIS — R310 Gross hematuria: Secondary | ICD-10-CM | POA: Insufficient documentation

## 2017-03-24 DIAGNOSIS — N3001 Acute cystitis with hematuria: Secondary | ICD-10-CM | POA: Insufficient documentation

## 2017-03-24 DIAGNOSIS — I639 Cerebral infarction, unspecified: Secondary | ICD-10-CM | POA: Insufficient documentation

## 2017-03-24 LAB — CBC WITH DIFFERENTIAL/PLATELET
BASOS ABS: 0 10*3/uL (ref 0.0–0.1)
Basophils Relative: 0 %
EOS ABS: 0.2 10*3/uL (ref 0.0–0.7)
Eosinophils Relative: 4 %
HCT: 33.3 % — ABNORMAL LOW (ref 39.0–52.0)
HEMOGLOBIN: 11.2 g/dL — AB (ref 13.0–17.0)
Lymphocytes Relative: 21 %
Lymphs Abs: 1 10*3/uL (ref 0.7–4.0)
MCH: 31.7 pg (ref 26.0–34.0)
MCHC: 33.6 g/dL (ref 30.0–36.0)
MCV: 94.3 fL (ref 78.0–100.0)
Monocytes Absolute: 0.6 10*3/uL (ref 0.1–1.0)
Monocytes Relative: 12 %
NEUTROS PCT: 63 %
Neutro Abs: 2.8 10*3/uL (ref 1.7–7.7)
Platelets: 178 10*3/uL (ref 150–400)
RBC: 3.53 MIL/uL — AB (ref 4.22–5.81)
RDW: 14.3 % (ref 11.5–15.5)
WBC: 4.5 10*3/uL (ref 4.0–10.5)

## 2017-03-24 LAB — BASIC METABOLIC PANEL
Anion gap: 7 (ref 5–15)
BUN: 20 mg/dL (ref 6–20)
CHLORIDE: 104 mmol/L (ref 101–111)
CO2: 27 mmol/L (ref 22–32)
CREATININE: 0.99 mg/dL (ref 0.61–1.24)
Calcium: 9.1 mg/dL (ref 8.9–10.3)
Glucose, Bld: 110 mg/dL — ABNORMAL HIGH (ref 65–99)
POTASSIUM: 4 mmol/L (ref 3.5–5.1)
SODIUM: 138 mmol/L (ref 135–145)

## 2017-04-01 ENCOUNTER — Encounter: Payer: Self-pay | Admitting: Internal Medicine

## 2017-04-01 ENCOUNTER — Non-Acute Institutional Stay (SKILLED_NURSING_FACILITY): Payer: Medicare Other | Admitting: Internal Medicine

## 2017-04-01 DIAGNOSIS — I63412 Cerebral infarction due to embolism of left middle cerebral artery: Secondary | ICD-10-CM | POA: Diagnosis not present

## 2017-04-01 DIAGNOSIS — I482 Chronic atrial fibrillation, unspecified: Secondary | ICD-10-CM

## 2017-04-01 DIAGNOSIS — R319 Hematuria, unspecified: Secondary | ICD-10-CM

## 2017-04-01 DIAGNOSIS — D696 Thrombocytopenia, unspecified: Secondary | ICD-10-CM

## 2017-04-01 DIAGNOSIS — E039 Hypothyroidism, unspecified: Secondary | ICD-10-CM

## 2017-04-01 DIAGNOSIS — E038 Other specified hypothyroidism: Secondary | ICD-10-CM

## 2017-04-01 NOTE — Progress Notes (Signed)
Location:   [facility] Room Number: 132/P Place of Service:  SNF (31)  Provider: Granville Lewis  PCP: Kathyrn Drown, MD Patient Care Team: Kathyrn Drown, MD as PCP - General (Family Medicine) Herminio Commons, MD as Attending Physician (Cardiology)  Extended Emergency Contact Information Primary Emergency Contact: Maeola Sarah of [place] Phone: [phone] Relation: Daughter Secondary Emergency Contact: Pegram,[name] Address: [address]          [place], [place] [address] [place] Phone: [phone] Work Phone: [phone] Mobile Phone: [phone] Relation: Daughter  Code Status: DNR Goals of care:  Advanced Directive information Advanced Directives 04/01/2017  Does Patient Have a Medical Advance Directive? Yes  Type of Advance Directive Out of facility DNR (pink MOST or yellow form)  Does patient want to make changes to medical advance directive? No - Patient declined  Copy of [place] in Chart? No - copy requested  Would patient like information on creating a medical advance directive? No - Patient declined     Allergies  Allergen Reactions  . Xanax [Alprazolam] Other (See Comments)    Dizzy, Sedated  . Tape Rash    PATIENT'S SKIN WILL TEAR/PLEASE EITHER USE PAPER TAPE OR COBAN WRAP!!    Chief Complaint  Patient presents with  . Discharge Note    Discharge Visit    HPI:  83 y.o. male  Seen today for discharge from facility later this week.  Patient has a history of CVA as well as chronic indwelling Foley catheter with hematuria-atrial fibrillation-hypertension-hypothyroidism-hyperlipidemia-history of prostate cancer status post radiation and chemotherapy-thrombocytopenia and leukopenia-as well as dysphasia  He has been here for rehabilitation and strengthening appears to have done relatively well in this regard-  Most acute issue during his stay has been recurrent  hematuria withchronic indwelling Foley catheter he has seen urology--- numerous  times and recently did have an area cauterized-he continues to have hematuria but hemoglobin has remained stable.Urology is following this closely He does have a history of atrial fibrillation  He continues on Eliquis dose was reduced secondary to hematuria and hemoglobin has remained stable.--he is on Lopressor for rate control   regards to history CVA he is again on low dose Eliquis --e is also on a statinLDL was 96 lipid panel done in the hospital  In regards to hypertension he is on Norvasc  As well as Lopressor-blood pressures appear to be stable I got 140/80 manually today.  Previous blood pressures 18/65-114/64  He also has a history of thrombocytopenia and leukopenia which has been stable during his stay here-ecent white count was 4500 in late October platelets 178,000.  In regards to hypothyroidism he is on Synthroid TSH last month was norml at 3.915.  Currently he is sitting in his wheelchair comfortably he has a very supportive family and does have full-time sitters-he would benefit from continued  PT and OT as well as home health follow-up for his numerous medical issues---            Past Medical History:  Diagnosis Date  . Afib ([place])   . Atrial fibrillation ([place])   . Cerebral infarction ([place])   . DDD (degenerative disc disease), lumbar   . Dysphagia   . Hypertension   . Hypothyroidism   . Migraine    "maybe monthly" (11/21/2015)  . Prostate cancer ([place]) ~ 1995   S/P radiation tx  . Renal mass    "slow growing something; doctors  just watching it right now" (11/21/2015)  . Stroke [facility])    TIA  . Thrombocytopenia ([place])   . Thyroid disease    hypothyroid    Past Surgical History:  Procedure Laterality Date  . COLONOSCOPY  1999  . ESOPHAGOGASTRODUODENOSCOPY    . LAPAROSCOPIC CHOLECYSTECTOMY  1980s  . PROSTATE BIOPSY  ~ 1995      reports that he has quit smoking. he has never  used smokeless tobacco. He reports that he drinks alcohol. He reports that he does not use drugs. Social History   Socioeconomic History  . Marital status: Widowed    Spouse name: Not on file  . Number of children: Not on file  . Years of education: Not on file  . Highest education level: Not on file  Social Needs  . Financial resource strain: Not on file  . Food insecurity - worry: Not on file  . Food insecurity - inability: Not on file  . Transportation needs - medical: Not on file  . Transportation needs - non-medical: Not on file  Occupational History  . Not on file  Tobacco Use  . Smoking status: Former Research scientist (life sciences)  . Smokeless tobacco: Never Used  . Tobacco comment: 11/21/2015 "might have smoked 2 packs of cigarettes in my lifetime"  Substance and Sexual Activity  . Alcohol use: Yes    Comment: 11/21/2015 "last beer I've had was in ~ 2007"  . Drug use: No  . Sexual activity: No  Other Topics Concern  . Not on file  Social History Narrative  . Not on file   Functional Status Survey:    Allergies  Allergen Reactions  . Xanax [Alprazolam] Other (See Comments)    Dizzy, Sedated  . Tape Rash    PATIENT'S SKIN WILL TEAR/PLEASE EITHER USE PAPER TAPE OR COBAN WRAP!!    Pertinent  Health Maintenance Due  Topic Date Due  . INFLUENZA VACCINE  Completed  . PNA vac Low Risk Adult  Completed    Medications: Outpatient Encounter Medications as of 04/01/2017  Medication Sig  . acetaminophen (TYLENOL) 325 MG tablet Take 325-650 mg by mouth every 6 (six) hours as needed for mild pain or moderate pain.  Marland Kitchen amLODipine (NORVASC) 5 MG tablet Take 1 tablet (5 mg total) by mouth daily.  Marland Kitchen apixaban (ELIQUIS) 2.5 MG TABS tablet Take 2.5 mg by mouth 2 (two) times daily.  Marland Kitchen atorvastatin (LIPITOR) 20 MG tablet Take 1 tablet (20 mg total) by mouth daily at 6 PM.  . cetirizine (ZYRTEC) 10 MG tablet Take 10 mg by mouth every evening.   Marland Kitchen levothyroxine (SYNTHROID, LEVOTHROID) 25 MCG tablet Take 1  tablet (25 mcg total) by mouth daily before breakfast.  . metoprolol succinate (TOPROL-XL) 25 MG 24 hr tablet Take 1 tablet (25 mg total) by mouth every evening.  . polyethylene glycol powder (GLYCOLAX/MIRALAX) powder MIX 1 CAPFUL IN 8 OZ OF WATER AND DRINK twice weekly   No facility-administered encounter medications on file as of 04/01/2017.      Review of Systems  Review of systems is largely unattainable secondary patient not speaking much-but per nursing in his sitter is doing well-eating well does not complaini of dysuria abdominal pain nausea vomiting shortness of breath or chest pain-weight appears to be stable  Vitals:   04/01/17 1245  BP: 114/64  Pulse: 71  Resp: 20  Temp: 98.2 F (36.8 C)  TempSrc: Oral  SpO2: 96%  Weight: 154 lb 9.6 oz (70.1 kg)  Height: 5'  11" (1.803 m)  manual blood pressure today was 140/80 Body mass index is 21.56 kg/m. Physical Exam   In general this is a somewhat frail elderly male who looks younger than his stated age -- is sitting comfortably in his wheelchair  His skin is warm and dry I do not see increased bruising beyond baseline.  Eyes he has prescription lenses visual acuity appears grossly intact sclera and conjunctiva are clear.  Heart is regular rate and rhythm with occasional irregular beats he has scant lower extremity edemaI could not appreciate a murmur.  Abdomen is soft nontender with positive bowel sounds.  GU has an indwelling Foley catheter draining blood-tinged urine apparently this is somewhat chronic.  Musculoskeletal --is able to move all extremities 4 and do transfers with assistance-upper extremity strengthappears reserved-has some lower extremity weakness which is baseline.  Neurologic as noted above he is relatively a phasic but does speak at times-cranial nerves appear to be grossly intact.  psych- He is oriented to self follow s verbal commands  Complete -4 assessment difficult secondary to patient really not  speaking much Labs reviewed: Basic Metabolic Panel: Recent Labs    03/10/17 1001 03/15/17 0700 03/24/17 0716  NA 139 137 138  K 4.3 4.0 4.0  CL 105 104 104  CO2 25 26 27   GLUCOSE 104* 97 110*  BUN 18 19 20   CREATININE 0.89 1.00 0.99  CALCIUM 9.4 8.8* 9.1   Liver Function Tests: Recent Labs    02/14/17 1825 02/25/17 1445 03/01/17 0630  AST 23 25 19   ALT 13* 17 14*  ALKPHOS 59 58 58  BILITOT 0.9 1.1 1.3*  PROT 7.3 6.8 6.2*  ALBUMIN 4.0 3.8 3.5   No results for input(s): LIPASE, AMYLASE in the last 8760 hours. No results for input(s): AMMONIA in the last 8760 hours. CBC: Recent Labs    03/10/17 1001 03/15/17 0700 03/24/17 0716  WBC 4.2 4.6 4.5  NEUTROABS 2.6 2.4 2.8  HGB 12.2* 11.3* 11.2*  HCT 36.9* 33.3* 33.3*  MCV 94.4 94.9 94.3  PLT 184 176 178   Cardiac Enzymes: Recent Labs    02/17/17 0532 02/17/17 1115 02/18/17 0201  TROPONINI 0.31* 0.24* 0.21*   BNP: Invalid input(s): POCBNP CBG: Recent Labs    06/02/16 1646 02/14/17 1833  GLUCAP 74 117*    Procedures and Imaging Studies During Stay: No results found.  Assessment/Plan:    Hematuria with chronic indwelling Foley catheter and history of prostate cancer status post radiation chemotherapy also history of renal mass-he is followed by urology closely continues to have some hematuria0--urology has seen him frequntly-clinically appears to be stable and will have follow-up by urology- Will check hemoglobin  before discharge  #2 history of atrial fibrillation --Eliinquis dose hase been decreased secondary to Hematuria--now 2.5 mg a day--rate controlled with lopressor  #3-history CVA-again continues on Eliquis--also on a statin LDL was 94 in hospital  #4-hypertension  As noted above this is stable on Norvasc 5 mg a day as well as Lopressor 25 mg a day  #5-hyperlipidemia as noted above is on a statin with LDL of 94  #6-history of chronic thrombocytopenia and leukopenia as noted above this has  been stable but will update a CBC before discharge  #7-history dysphagia actually passed a swallow test in the hospital has been on a modified diet here.  Again patient will be going home and he does have full-time sitters would benefit from continued PT and OT for strengthening-also will need home health  support for his multiple medical issues.  We will update a CBC as well as a metabolic panel before dischargeto ensure stabilityof hemoglobin as well as electrolytes  [id number]- Of note greater than 30 minutes spent on this discharge summary-greater than 50% of time spent coordinating plan of care for numerous diagnoses

## 2017-04-02 ENCOUNTER — Encounter (HOSPITAL_COMMUNITY)
Admission: RE | Admit: 2017-04-02 | Discharge: 2017-04-02 | Disposition: A | Discharge status: Home / Self Care | Payer: Medicare Other | Source: Skilled Nursing Facility | Attending: Internal Medicine | Admitting: Internal Medicine

## 2017-04-02 DIAGNOSIS — I639 Cerebral infarction, unspecified: Secondary | ICD-10-CM | POA: Insufficient documentation

## 2017-04-02 DIAGNOSIS — Z5189 Encounter for other specified aftercare: Secondary | ICD-10-CM | POA: Insufficient documentation

## 2017-04-02 DIAGNOSIS — N4289 Other specified disorders of prostate: Secondary | ICD-10-CM | POA: Insufficient documentation

## 2017-04-02 DIAGNOSIS — R310 Gross hematuria: Secondary | ICD-10-CM | POA: Insufficient documentation

## 2017-04-02 DIAGNOSIS — N3001 Acute cystitis with hematuria: Secondary | ICD-10-CM | POA: Insufficient documentation

## 2017-04-02 LAB — BASIC METABOLIC PANEL
Anion gap: 6 (ref 5–15)
BUN: 22 mg/dL — AB (ref 6–20)
CALCIUM: 8.8 mg/dL — AB (ref 8.9–10.3)
CO2: 24 mmol/L (ref 22–32)
Chloride: 105 mmol/L (ref 101–111)
Creatinine, Ser: 1.01 mg/dL (ref 0.61–1.24)
GFR calc Af Amer: >60 mL/min (ref >60)
GFR, EST NON AFRICAN AMERICAN: 60 mL/min — AB (ref >60)
GLUCOSE: 92 mg/dL (ref 65–99)
Potassium: 3.8 mmol/L (ref 3.5–5.1)
Sodium: 135 mmol/L (ref 135–145)

## 2017-04-02 LAB — CBC WITH DIFFERENTIAL/PLATELET
Basophils Absolute: 0 10*3/uL (ref 0.0–0.1)
Basophils Relative: 0 %
EOS PCT: 6 %
Eosinophils Absolute: 0.2 10*3/uL (ref 0.0–0.7)
HCT: 28.7 % — ABNORMAL LOW (ref 39.0–52.0)
Hemoglobin: 9.6 g/dL — ABNORMAL LOW (ref 13.0–17.0)
LYMPHS ABS: 1.2 10*3/uL (ref 0.7–4.0)
LYMPHS PCT: 28 %
MCH: 32.4 pg (ref 26.0–34.0)
MCHC: 33.4 g/dL (ref 30.0–36.0)
MCV: 97 fL (ref 78.0–100.0)
MONO ABS: 0.5 10*3/uL (ref 0.1–1.0)
Monocytes Relative: 12 %
Neutro Abs: 2.2 10*3/uL (ref 1.7–7.7)
Neutrophils Relative %: 54 %
PLATELETS: 156 10*3/uL (ref 150–400)
RBC: 2.96 MIL/uL — ABNORMAL LOW (ref 4.22–5.81)
RDW: 14.9 % (ref 11.5–15.5)
WBC: 4.1 10*3/uL (ref 4.0–10.5)

## 2017-04-05 ENCOUNTER — Telehealth: Payer: Self-pay | Admitting: Family Medicine

## 2017-04-05 DIAGNOSIS — Z9221 Personal history of antineoplastic chemotherapy: Secondary | ICD-10-CM | POA: Diagnosis not present

## 2017-04-05 DIAGNOSIS — Z923 Personal history of irradiation: Secondary | ICD-10-CM | POA: Diagnosis not present

## 2017-04-05 DIAGNOSIS — N2889 Other specified disorders of kidney and ureter: Secondary | ICD-10-CM | POA: Diagnosis not present

## 2017-04-05 DIAGNOSIS — Z960 Presence of urogenital implants: Secondary | ICD-10-CM | POA: Diagnosis not present

## 2017-04-05 DIAGNOSIS — I69351 Hemiplegia and hemiparesis following cerebral infarction affecting right dominant side: Secondary | ICD-10-CM | POA: Diagnosis not present

## 2017-04-05 DIAGNOSIS — I69322 Dysarthria following cerebral infarction: Secondary | ICD-10-CM | POA: Diagnosis not present

## 2017-04-05 DIAGNOSIS — E039 Hypothyroidism, unspecified: Secondary | ICD-10-CM | POA: Diagnosis not present

## 2017-04-05 DIAGNOSIS — M5136 Other intervertebral disc degeneration, lumbar region: Secondary | ICD-10-CM | POA: Diagnosis not present

## 2017-04-05 DIAGNOSIS — G43909 Migraine, unspecified, not intractable, without status migrainosus: Secondary | ICD-10-CM | POA: Diagnosis not present

## 2017-04-05 DIAGNOSIS — D696 Thrombocytopenia, unspecified: Secondary | ICD-10-CM | POA: Diagnosis not present

## 2017-04-05 DIAGNOSIS — I482 Chronic atrial fibrillation: Secondary | ICD-10-CM | POA: Diagnosis not present

## 2017-04-05 DIAGNOSIS — Z8546 Personal history of malignant neoplasm of prostate: Secondary | ICD-10-CM | POA: Diagnosis not present

## 2017-04-05 DIAGNOSIS — Z87891 Personal history of nicotine dependence: Secondary | ICD-10-CM | POA: Diagnosis not present

## 2017-04-05 DIAGNOSIS — R319 Hematuria, unspecified: Secondary | ICD-10-CM | POA: Diagnosis not present

## 2017-04-05 DIAGNOSIS — D72819 Decreased white blood cell count, unspecified: Secondary | ICD-10-CM | POA: Diagnosis not present

## 2017-04-05 DIAGNOSIS — I10 Essential (primary) hypertension: Secondary | ICD-10-CM | POA: Diagnosis not present

## 2017-04-05 DIAGNOSIS — R1310 Dysphagia, unspecified: Secondary | ICD-10-CM | POA: Diagnosis not present

## 2017-04-05 NOTE — Telephone Encounter (Signed)
May have CBC to be drawn this week.

## 2017-04-05 NOTE — Telephone Encounter (Signed)
Received a phone call from [facility] stating patient was discharged from nursing home and family is requesting a hemoglobin to be drawn. May I give verbal orders?   Call Verdis Frederickson at  [phone]

## 2017-04-05 NOTE — Telephone Encounter (Signed)
Spoke with Verdis Frederickson and gave verbal orders per [name] to draw a CBC this week. Verdis Frederickson verbalized understanding.

## 2017-04-06 ENCOUNTER — Encounter (INDEPENDENT_AMBULATORY_CARE_PROVIDER_SITE_OTHER): Payer: Self-pay

## 2017-04-06 ENCOUNTER — Ambulatory Visit (INDEPENDENT_AMBULATORY_CARE_PROVIDER_SITE_OTHER): Payer: Medicare Other | Admitting: Neurology

## 2017-04-06 ENCOUNTER — Encounter: Payer: Self-pay | Admitting: Neurology

## 2017-04-06 VITALS — BP 118/64 | HR 78 | Wt 150.0 lb

## 2017-04-06 DIAGNOSIS — Z8673 Personal history of transient ischemic attack (TIA), and cerebral infarction without residual deficits: Secondary | ICD-10-CM

## 2017-04-06 DIAGNOSIS — I482 Chronic atrial fibrillation, unspecified: Secondary | ICD-10-CM

## 2017-04-06 DIAGNOSIS — R310 Gross hematuria: Secondary | ICD-10-CM | POA: Diagnosis not present

## 2017-04-06 DIAGNOSIS — I639 Cerebral infarction, unspecified: Secondary | ICD-10-CM | POA: Diagnosis not present

## 2017-04-06 DIAGNOSIS — I63412 Cerebral infarction due to embolism of left middle cerebral artery: Secondary | ICD-10-CM | POA: Diagnosis not present

## 2017-04-06 NOTE — Progress Notes (Signed)
STROKE NEUROLOGY FOLLOW UP NOTE  NAME: Ethan Faulkner DOB: 09/13/1915  REASON FOR VISIT: stroke follow up HISTORY FROM: Daughter and chart  Today we had the pleasure of seeing Ethan Faulkner in follow-up at our Neurology Clinic. Pt was accompanied by daughter.   History Summary Mr.Ethan L Higdonis a 83 y.o.malewith history of afib off eliquis for a week due to hematuria, HTN, prostate cancer and renal mass, previous right IC/BG stroke in 04/2016 admitted on 02/14/17 for aphasia, left gaze and right side weakness.  He received tPA.  MRI showed moderate large acute infarct in the left parietal lobe and small acute infarct right posterior frontal lobe, embolic secondary to A. fib off Eliquis due to hematuria.  CTA head and neck diffuse intracranial stenosis especially bilateral P2.  EF 55-60%.  LDL 96 and A1c 5.4.  Hematuria resolved off Eliquis PTA on Foley catheter, however, recurred after TPA, but resolved during hospitalization, Eliquis resumed 4 days post stroke.  Patient discharged with Eliquis 5 mg twice daily and Lipitor 20 and recommend outpatient follow-up with urology.  Still has expressive aphasia and right hemiparesis on discharge.  Interval History During the interval time, the patient has been doing better.  He was admitted on 03/02/17 in [place], [place] for hematuria.  Urology consulted, status post cystoscope and cauterization.  Hematuria resolved for short period time.  He was readmitted 03/10/17 again for hematuria status post again cauterization.  Doing well for several days before a fall in nursing facility which caused recurrence of hematuria.  Urology decreased Eliquis to 2.5 mg twice daily, but patient still have continued small amount hematuria.  His hemoglobin dropping from 12.2 in October to 9.6 recently.  Today, patient clinic still has expressive aphasia however follows all commands, right side hemiparesis much improved.  Still has Foley catheter, with bloody  urine in the bag.  He has appointment with PCP tomorrow and with urologist Friday.  Plan to start home PT/OT/speech tomorrow.  REVIEW OF SYSTEMS: Full 14 system review of systems performed and notable only for those listed below and in HPI above, all others are negative:  Constitutional: Fatigue Cardiovascular:  Ear/Nose/Throat: Runny nose, drooling Skin:  Eyes:   Respiratory: Cough Gastroitestinal:   Genitourinary: Blood in urine Hematology/Lymphatic:   Endocrine:  Musculoskeletal: Walking difficulty Allergy/Immunology:   Neurological: Memory loss, speech difficulty, weakness, facial drooping Psychiatric:  Sleep:   The following represents the patient's updated allergies and side effects list: Allergies  Allergen Reactions  . Xanax [Alprazolam] Other (See Comments)    Dizzy, Sedated  . Tape Rash    PATIENT'S SKIN WILL TEAR/PLEASE EITHER USE PAPER TAPE OR COBAN WRAP!!    The neurologically relevant items on the patient's problem list were reviewed on today's visit.  Neurologic Examination  A problem focused neurological exam (12 or more points of the single system neurologic examination, vital signs counts as 1 point, cranial nerves count for 8 points) was performed.  Blood pressure 118/64, pulse 78, weight 150 lb (68 kg).  General - Well nourished, well developed, in no apparent distress.  Ophthalmologic - Fundi not visualized due to noncooperation.  Cardiovascular - irregularly irregular heart rate and rhythm.  Mental Status -  Expressive aphasia, able to follow simple commands, not able to name or repeat.  Cranial Nerves II - XII - II - Visual field intact OU. III, IV, VI - Extraocular movements intact. V - Facial sensation intact bilaterally. VII - Facial movement intact bilaterally. VIII - Hearing &  vestibular intact bilaterally. X - Palate elevates symmetrically. XI - Chin turning & shoulder shrug intact bilaterally. XII - Tongue protrusion intact.  Motor  Strength - The patient's strength was 4+/5 in BUEs and 3/5 BLEs and pronator drift was absent.  Bulk was normal and fasciculations were absent.   Motor Tone - Muscle tone was assessed at the neck and appendages and was normal.  Reflexes - The patient's reflexes were 1+ in all extremities and he had no pathological reflexes.  Sensory - Light touch, temperature/pinprick were assessed and were symmetrical.    Coordination - The patient had normal movements in the hands with no ataxia or dysmetria.  Tremor was absent.  Gait and Station -in wheelchair not tested    Functional score  mRS = 4   0 - No symptoms.   1 - No significant disability. Able to carry out all usual activities, despite some symptoms.   2 - Slight disability. Able to look after own affairs without assistance, but unable to carry out all previous activities.   3 - Moderate disability. Requires some help, but able to walk unassisted.   4 - Moderately severe disability. Unable to attend to own bodily needs without assistance, and unable to walk unassisted.   5 - Severe disability. Requires constant nursing care and attention, bedridden, incontinent.   6 - Dead.   NIH Stroke Scale   Level Of Consciousness 0=Alert; keenly responsive 1=Not alert, but arousable by minor stimulation 2=Not alert, requires repeated stimulation 3=Responds only with reflex movements 0  LOC Questions to Month and Age 65=Answers both questions correctly 1=Answers one question correctly 2=Answers neither question correctly 2  LOC Commands      -Open/Close eyes     -Open/close grip 0=Performs both tasks correctly 1=Performs one task correctly 2=Performs neighter task correctly 0  Best Gaze 0=Normal 1=Partial gaze palsy 2=Forced deviation, or total gaze paresis 0  Visual 0=No visual loss 1=Partial hemianopia 2=Complete hemianopia 3=Bilateral hemianopia (blind including cortical blindness) 0  Facial Palsy 0=Normal symmetrical  movement 1=Minor paralysis (asymmetry) 2=Partial paralysis (lower face) 3=Complete paralysis (upper and lower face) 0  Motor  0=No drift, limb holds posture for full 10 seconds 1=Drift, limb holds posture, no drift to bed 2=Some antigravity effort, cannot maintain posture, drifts to bed 3=No effort against gravity, limb falls 4=No movement Right Arm 0     Leg 0    Left Arm 0     Leg 0  Limb Ataxia 0=Absent 1=Present in one limb 2=Present in two limbs 0  Sensory 0=Normal 1=Mild to moderate sensory loss 2=Severe to total sensory loss 0  Best Language 0=No aphasia, normal 1=Mild to moderate aphasia 2=Mute, global aphasia 3=Mute, global aphasia 2  Dysarthria 0=Normal 1=Mild to moderate 2=Severe, unintelligible or mute/anarthric 2  Extinction/Neglect 0=No abnormality 1=Extinction to bilateral simultaneous stimulation 2=Profound neglect 0  Total   6     Data reviewed: I personally reviewed the images and agree with the radiology interpretations.  Ct Angio Head and neck W/cm &/or Wo Cm 02/14/2017 IMPRESSION: No significant carotid or vertebral artery stenosis in the neck Diffuse intracranial atherosclerotic disease including the middle cerebral arteries bilaterally. Moderate stenosis P2 segment bilaterally in the posterior cerebral arteries. No intracranial large vessel occlusion. No intracranial hemorrhage identified.  Ct Head Code Stroke Wo Contrast 02/14/2017 IMPRESSION: 1. New age indeterminate RIGHT basal ganglia lacunar infarct. 2. New nonacute RIGHT thalamus infarct. 3. Old small RIGHT cerebellar infarct. Stable moderate to severe chronic small vessel ischemic  disease. 4. ASPECTS is 10.   Ct Head Wo Contrast 02/15/2017 IMPRESSION: 1. New, acute LEFT MCA territory nonhemorrhagic infarct. 2. Old lacunar infarcts and moderate to severe chronic small vessel ischemic disease.   Dg Chest Port 1 View 02/16/2017 IMPRESSION: 1. Negative for pulmonary edema. 2. Low volume  chest with mild atelectasis.   Mr Brain Limited Wo Contrast 02/16/2017 IMPRESSION: Moderately large acute infarct in the left parietal lobe Small acute infarct right posterior parietal lobe.   TTE - The patient was in atrial fibrillation. Normal LV size with mild LV hypertrophy. EF 55-60%. Mildly dilated RV with mildly decreased systolic function. Biatrial enlargement. Mild pulmonary hypertension. Mild aortic insufficiency. Moderate TR  Component     Latest Ref Rng & Units 02/15/2017 03/01/2017  Cholesterol     0 - 200 mg/dL 150   Triglycerides     <150 mg/dL 53   HDL Cholesterol     >40 mg/dL 43   Total CHOL/HDL Ratio     RATIO 3.5   VLDL     0 - 40 mg/dL 11   LDL (calc)     0 - 99 mg/dL 96   Hemoglobin A1C     4.8 - 5.6 % 5.4   Mean Plasma Glucose     mg/dL 108.28   TSH     0.350 - 4.500 uIU/mL  3.915    Assessment: As you may recall, he is a 83 y.o. Caucasian male with PMH of afib off eliquis for a week due to hematuria, HTN, prostate cancer and renal mass, previous right IC/BG stroke in 04/2016 admitted on 02/14/17 for moderate large acute infarct in the left MCA and small acute infarct right MCA/ACA, embolic secondary to A. fib off Eliquis due to hematuria.  CTA head and neck diffuse intracranial stenosis especially bilateral P2.  EF 55-60%.  LDL 96 and A1c 5.4.  Hematuria resolved off Eliquis PTA on Foley catheter, however, recurred after TPA, but resolved during hospitalization, Eliquis resumed 4 days post stroke.  Patient discharged with Eliquis 5 mg twice daily and Lipitor 20. Re-admitted on 03/02/17 in [place], [place] for hematuria.  Urology consulted, status post cystoscope and cauterization.  Hematuria resolved for short period time.  He was readmitted 03/10/17 again for hematuria status post again cauterization.  Doing well for several days before recurrence of hematuria.  Urology decreased Eliquis to 2.5 mg twice daily, but patient still have continued hematuria.  His  hemoglobin dropping from 12.2 on 03/10/2017 to 9.6 on 04/02/17. Currently, patient still has expressive aphasia but follows all simple commands, right side hemiparesis much improved. Plan to start home PT/OT/speech tomorrow.  Discussed with daughter, patient on 2.5 mg twice daily of Eliquis which is not the full dose of anticoagulation for stroke prevention.  For full stroke protection, patient need to be on 5 mg twice daily which may cause severe hematuria and need of blood transfusion.  With the current 2.5 mg twice daily dosing, patient still have risk of stroke but also suffer from hematuria and Foley catheter.  If family and the patient would like to be more comfort and better quality of life and willing to take risk of stroke, may consider completely off Eliquis with hematuria resolution for comfort purposes.  Daughter would like to discuss with other family members and discussed with PCP and urologist this week.  Plan:  - continue eliquis 2.5mg  twice a day and lipitor for stroke prevention for now - encourage to discuss within family and  with PCP and urologist for further decision on eliquis.   - Follow up with your primary care physician for stroke risk factor modification. Recommend maintain blood pressure goal <130/80, diabetes with hemoglobin A1c goal below 7.0% and lipids with LDL cholesterol goal below 70 mg/dL.  - continue PT/OT/speech - healthy diet and avoid fall or choking - check BP at home and record - follow up in 3 months.   No orders of the defined types were placed in this encounter.   No orders of the defined types were placed in this encounter.   Patient Instructions  - continue eliquis 2.5mg  twice a day and lipitor for stroke prevention for now - encourage to discuss within family and with PCP and urologist for further decision on eliquis. For full protection, pt should be on eliquis 5mg  twice a day for stroke prevention but it will increase bleeding risk and may require  transfusion. However, stay on eliquis 2.5mg  twice a day not fully protect stroke but also cause continued mild hematuria. Another solution is to take off elqius and hope to have hematuria resolved and able to take off catheter for quality of life purpose with knowing that stroke will not protected.  - Follow up with your primary care physician for stroke risk factor modification. Recommend maintain blood pressure goal <130/80, diabetes with hemoglobin A1c goal below 7.0% and lipids with LDL cholesterol goal below 70 mg/dL.  - continue PT/OT/speech and work hard with them - healthy diet and avoid fall or choking - check BP at home and record - follow up in 3 months.    Rosalin Hawking, MD PhD [facility] [address] [place], [place] [address] [phone]

## 2017-04-06 NOTE — Patient Instructions (Signed)
-   continue eliquis 2.5mg  twice a day and lipitor for stroke prevention for now - encourage to discuss within family and with PCP and urologist for further decision on eliquis. For full protection, pt should be on eliquis 5mg  twice a day for stroke prevention but it will increase bleeding risk and may require transfusion. However, stay on eliquis 2.5mg  twice a day not fully protect stroke but also cause continued mild hematuria. Another solution is to take off elqius and hope to have hematuria resolved and able to take off catheter for quality of life purpose with knowing that stroke will not protected.  - Follow up with your primary care physician for stroke risk factor modification. Recommend maintain blood pressure goal <130/80, diabetes with hemoglobin A1c goal below 7.0% and lipids with LDL cholesterol goal below 70 mg/dL.  - continue PT/OT/speech and work hard with them - healthy diet and avoid fall or choking - check BP at home and record - follow up in 3 months.

## 2017-04-07 ENCOUNTER — Telehealth: Payer: Self-pay | Admitting: Family Medicine

## 2017-04-07 DIAGNOSIS — I69322 Dysarthria following cerebral infarction: Secondary | ICD-10-CM | POA: Diagnosis not present

## 2017-04-07 DIAGNOSIS — I10 Essential (primary) hypertension: Secondary | ICD-10-CM | POA: Diagnosis not present

## 2017-04-07 DIAGNOSIS — I69351 Hemiplegia and hemiparesis following cerebral infarction affecting right dominant side: Secondary | ICD-10-CM | POA: Diagnosis not present

## 2017-04-07 DIAGNOSIS — R319 Hematuria, unspecified: Secondary | ICD-10-CM | POA: Diagnosis not present

## 2017-04-07 DIAGNOSIS — I482 Chronic atrial fibrillation: Secondary | ICD-10-CM | POA: Diagnosis not present

## 2017-04-07 DIAGNOSIS — R1310 Dysphagia, unspecified: Secondary | ICD-10-CM | POA: Diagnosis not present

## 2017-04-07 NOTE — Telephone Encounter (Signed)
Please give verbal order for physical therapy/home health/occupational therapy

## 2017-04-07 NOTE — Telephone Encounter (Signed)
Pt went home from [facility] with a speech eval order, Ethan Faulkner with [facility] needs a verbal order to see pt once next week and then twice a week for 4 weeks  May leave verbal on voicemail (just leave name & job title)  Please advise

## 2017-04-08 ENCOUNTER — Encounter: Payer: Self-pay | Admitting: Family Medicine

## 2017-04-08 ENCOUNTER — Ambulatory Visit (INDEPENDENT_AMBULATORY_CARE_PROVIDER_SITE_OTHER): Payer: Medicare Other | Admitting: Family Medicine

## 2017-04-08 VITALS — BP 122/64 | Ht 70.0 in | Wt 156.0 lb

## 2017-04-08 DIAGNOSIS — I679 Cerebrovascular disease, unspecified: Secondary | ICD-10-CM | POA: Diagnosis not present

## 2017-04-08 DIAGNOSIS — D649 Anemia, unspecified: Secondary | ICD-10-CM

## 2017-04-08 DIAGNOSIS — I639 Cerebral infarction, unspecified: Secondary | ICD-10-CM

## 2017-04-08 DIAGNOSIS — R319 Hematuria, unspecified: Secondary | ICD-10-CM | POA: Diagnosis not present

## 2017-04-08 LAB — POCT HEMOGLOBIN: HEMOGLOBIN: 10 g/dL — AB (ref 14.1–18.1)

## 2017-04-08 NOTE — Telephone Encounter (Signed)
Spoke with Bethena Roys with [facility] and gave verbal orders to do speech evaluation. Bethena Roys verbalized understanding.

## 2017-04-08 NOTE — Progress Notes (Signed)
Subjective:    Patient ID: Ethan Faulkner, male    DOB: 03-20-1916, 83 y.o.   MRN: [id number]  HPI Patient is here with his children. They state he is being released from the [facility] brought him in today to go over his medications. The patient was in the [facility].  Overall he is doing fairly well gaining his strength back undergoing therapy Unfortunately he has significant a aphasia issues but he does seem to enjoy being around others and seems to understand what they have to say to him. It was felt that his stroke was related into the fact he was off Eliquis while he had severe hematuria.  After his stroke he was started back on Eliquis at a lower dose 2.5 mg twice daily.  Even though it was known that the medication would not fully protect him against strokes it was felt that some was better than none While the patient was on the full dose of Eliquis he had significant heavy bleeding that required transfusion Would like to speak with you without the pt present.1 Review of Systems  Constitutional: Negative for activity change, fatigue and fever.  Respiratory: Negative for cough and shortness of breath.   Cardiovascular: Negative for chest pain and leg swelling.  Neurological: Negative for headaches.       Objective:   Physical Exam  Constitutional: He appears well-nourished. No distress.  Cardiovascular: Normal rate and normal heart sounds.  No murmur heard. Pulmonary/Chest: Effort normal and breath sounds normal. No respiratory distress.  Musculoskeletal: He exhibits no edema.  Lymphadenopathy:    He has no cervical adenopathy.  Neurological: He is alert.  Psychiatric: His behavior is normal.  Vitals reviewed. Patient has indwelling catheter Irregular rhythm  Results for orders placed or performed in visit on 04/08/17  POCT hemoglobin  Result Value Ref Range   Hemoglobin 10.0 (A) 14.1 - 18.1 g/dL  I talked with the patient today and he did relate that he would  like to see the catheter removed    Assessment & Plan:  Anemia-related to his hematuria, they will be seen urology in the morning and they will discuss what the next step is.  Unfortunately the hematuria will be ongoing as long as he is on the Eliquis  Recent stroke he is gradually making some improvement but it is unlikely that his aphasia will get dramatically better  Long discussion with his daughter today regarding the stroke and regarding quality of life.  We also talked about how Eliquis at the full dose caused too much blood loss through hematuria.  We also discussed how this lower dose of Eliquis does not protect him against strokes the same way the full dose does.  We also discussed that he still has some hematuria with the lower dose.  In addition to this we discussed going without Eliquis in the pros and cons regarding that.  She will discuss it over with other family members in my opinion I believe they should discuss it with their father I will be happy to assist presenting these options to them she will call me back to let me know if I can be of assistance.  I will also message neurology to see if Dr.Xu could estimate the percent risk of having a stroke in the coming year if not on Eliquis (both the family and myself recognize that this is a very difficult estimation and will just be used for practical discussion points rather than a firm percentage)  25 minutes was spent with the patient. Greater than half the time was spent in discussion and answering questions and counseling regarding the issues that the patient came in for today.

## 2017-04-09 ENCOUNTER — Ambulatory Visit (INDEPENDENT_AMBULATORY_CARE_PROVIDER_SITE_OTHER): Payer: Medicare Other | Admitting: Urology

## 2017-04-09 DIAGNOSIS — I69351 Hemiplegia and hemiparesis following cerebral infarction affecting right dominant side: Secondary | ICD-10-CM | POA: Diagnosis not present

## 2017-04-09 DIAGNOSIS — R310 Gross hematuria: Secondary | ICD-10-CM | POA: Diagnosis not present

## 2017-04-09 DIAGNOSIS — R319 Hematuria, unspecified: Secondary | ICD-10-CM | POA: Diagnosis not present

## 2017-04-09 DIAGNOSIS — R1310 Dysphagia, unspecified: Secondary | ICD-10-CM | POA: Diagnosis not present

## 2017-04-09 DIAGNOSIS — R338 Other retention of urine: Secondary | ICD-10-CM

## 2017-04-09 DIAGNOSIS — I10 Essential (primary) hypertension: Secondary | ICD-10-CM | POA: Diagnosis not present

## 2017-04-09 DIAGNOSIS — I482 Chronic atrial fibrillation: Secondary | ICD-10-CM | POA: Diagnosis not present

## 2017-04-09 DIAGNOSIS — I69322 Dysarthria following cerebral infarction: Secondary | ICD-10-CM | POA: Diagnosis not present

## 2017-04-12 ENCOUNTER — Telehealth: Payer: Self-pay | Admitting: Family Medicine

## 2017-04-12 DIAGNOSIS — I69351 Hemiplegia and hemiparesis following cerebral infarction affecting right dominant side: Secondary | ICD-10-CM | POA: Diagnosis not present

## 2017-04-12 DIAGNOSIS — I10 Essential (primary) hypertension: Secondary | ICD-10-CM | POA: Diagnosis not present

## 2017-04-12 DIAGNOSIS — I482 Chronic atrial fibrillation: Secondary | ICD-10-CM | POA: Diagnosis not present

## 2017-04-12 DIAGNOSIS — I69322 Dysarthria following cerebral infarction: Secondary | ICD-10-CM | POA: Diagnosis not present

## 2017-04-12 DIAGNOSIS — R1310 Dysphagia, unspecified: Secondary | ICD-10-CM | POA: Diagnosis not present

## 2017-04-12 DIAGNOSIS — R319 Hematuria, unspecified: Secondary | ICD-10-CM | POA: Diagnosis not present

## 2017-04-12 MED ORDER — KETOCONAZOLE 2 % EX CREA: 1.0000 "application " | TOPICAL_CREAM | Freq: Two times a day (BID) | CUTANEOUS | 4 refills | Status: DC

## 2017-04-12 NOTE — Telephone Encounter (Signed)
Ketoconazole cream may use twice daily as needed, 45 g tube, 4 refills

## 2017-04-12 NOTE — Telephone Encounter (Signed)
Prescription sent electronically to pharmacy. Family notified.

## 2017-04-12 NOTE — Telephone Encounter (Signed)
Patient was supposed to have an antifungal cream called in last Friday, but pharmacy didn't have anything. Please advise.   Ethan Faulkner

## 2017-04-13 ENCOUNTER — Telehealth: Payer: Self-pay | Admitting: Family Medicine

## 2017-04-13 ENCOUNTER — Encounter: Payer: Self-pay | Admitting: Family Medicine

## 2017-04-13 DIAGNOSIS — R1310 Dysphagia, unspecified: Secondary | ICD-10-CM | POA: Diagnosis not present

## 2017-04-13 DIAGNOSIS — I10 Essential (primary) hypertension: Secondary | ICD-10-CM | POA: Diagnosis not present

## 2017-04-13 DIAGNOSIS — R319 Hematuria, unspecified: Secondary | ICD-10-CM | POA: Diagnosis not present

## 2017-04-13 DIAGNOSIS — I69322 Dysarthria following cerebral infarction: Secondary | ICD-10-CM | POA: Diagnosis not present

## 2017-04-13 DIAGNOSIS — D696 Thrombocytopenia, unspecified: Secondary | ICD-10-CM | POA: Diagnosis not present

## 2017-04-13 DIAGNOSIS — I69351 Hemiplegia and hemiparesis following cerebral infarction affecting right dominant side: Secondary | ICD-10-CM | POA: Diagnosis not present

## 2017-04-13 DIAGNOSIS — I482 Chronic atrial fibrillation: Secondary | ICD-10-CM | POA: Diagnosis not present

## 2017-04-13 NOTE — Telephone Encounter (Signed)
Nurses-please call daughter [name] [place])-let her know that I received message regarding her dad.  The decision to increase Eliquis is moderately complex.  I am not opposed to increasing to 5 mg twice a day.  His risk of stroke without Eliquis is 8-1/2 % per year. the family should be aware that there is risk he could start having significant hematuria again if we increase the dose to 5 mg twice daily and if significant bleeding occurs further adjustment or stopping of medication could be necessary.  It is also possible that if the patient starts having significant hematuria he could be facing transfusion.  I do believe it is wise for this decision to reflect not only the patient's wishes but also be in consensus with family.  Please ask [name] which 1 of the family members holds medical power of attorney. Once  this information is gathered please let me know

## 2017-04-13 NOTE — Telephone Encounter (Signed)
I did call [name] left a message on her machine stating that I would be happy to speak with her before that office visit if she would desire to do so.  She will call us if she would like that conversation otherwise we will discuss it further with him when he follows up in early December

## 2017-04-13 NOTE — Telephone Encounter (Signed)
Left message to return call 

## 2017-04-13 NOTE — Telephone Encounter (Addendum)
Discussed with [name]. [name] states she hold the POA and hhas decide to just wait to make a decision till he is seen again

## 2017-04-14 DIAGNOSIS — I10 Essential (primary) hypertension: Secondary | ICD-10-CM | POA: Diagnosis not present

## 2017-04-14 DIAGNOSIS — I69351 Hemiplegia and hemiparesis following cerebral infarction affecting right dominant side: Secondary | ICD-10-CM | POA: Diagnosis not present

## 2017-04-14 DIAGNOSIS — I69322 Dysarthria following cerebral infarction: Secondary | ICD-10-CM | POA: Diagnosis not present

## 2017-04-14 DIAGNOSIS — R1310 Dysphagia, unspecified: Secondary | ICD-10-CM | POA: Diagnosis not present

## 2017-04-14 DIAGNOSIS — R319 Hematuria, unspecified: Secondary | ICD-10-CM | POA: Diagnosis not present

## 2017-04-14 DIAGNOSIS — I482 Chronic atrial fibrillation: Secondary | ICD-10-CM | POA: Diagnosis not present

## 2017-04-16 DIAGNOSIS — R1310 Dysphagia, unspecified: Secondary | ICD-10-CM | POA: Diagnosis not present

## 2017-04-16 DIAGNOSIS — I10 Essential (primary) hypertension: Secondary | ICD-10-CM | POA: Diagnosis not present

## 2017-04-16 DIAGNOSIS — I69322 Dysarthria following cerebral infarction: Secondary | ICD-10-CM | POA: Diagnosis not present

## 2017-04-16 DIAGNOSIS — R319 Hematuria, unspecified: Secondary | ICD-10-CM | POA: Diagnosis not present

## 2017-04-16 DIAGNOSIS — I482 Chronic atrial fibrillation: Secondary | ICD-10-CM | POA: Diagnosis not present

## 2017-04-16 DIAGNOSIS — I69351 Hemiplegia and hemiparesis following cerebral infarction affecting right dominant side: Secondary | ICD-10-CM | POA: Diagnosis not present

## 2017-04-19 ENCOUNTER — Telehealth: Payer: Self-pay | Admitting: Family Medicine

## 2017-04-19 DIAGNOSIS — R319 Hematuria, unspecified: Secondary | ICD-10-CM | POA: Diagnosis not present

## 2017-04-19 DIAGNOSIS — I69322 Dysarthria following cerebral infarction: Secondary | ICD-10-CM | POA: Diagnosis not present

## 2017-04-19 DIAGNOSIS — R1310 Dysphagia, unspecified: Secondary | ICD-10-CM | POA: Diagnosis not present

## 2017-04-19 DIAGNOSIS — I10 Essential (primary) hypertension: Secondary | ICD-10-CM | POA: Diagnosis not present

## 2017-04-19 DIAGNOSIS — I482 Chronic atrial fibrillation: Secondary | ICD-10-CM | POA: Diagnosis not present

## 2017-04-19 DIAGNOSIS — I69351 Hemiplegia and hemiparesis following cerebral infarction affecting right dominant side: Secondary | ICD-10-CM | POA: Diagnosis not present

## 2017-04-19 NOTE — Telephone Encounter (Signed)
Review lab results in results folder from [facility].

## 2017-04-20 DIAGNOSIS — I482 Chronic atrial fibrillation: Secondary | ICD-10-CM | POA: Diagnosis not present

## 2017-04-20 DIAGNOSIS — I69351 Hemiplegia and hemiparesis following cerebral infarction affecting right dominant side: Secondary | ICD-10-CM | POA: Diagnosis not present

## 2017-04-20 DIAGNOSIS — I69322 Dysarthria following cerebral infarction: Secondary | ICD-10-CM | POA: Diagnosis not present

## 2017-04-20 DIAGNOSIS — R1310 Dysphagia, unspecified: Secondary | ICD-10-CM | POA: Diagnosis not present

## 2017-04-20 DIAGNOSIS — I10 Essential (primary) hypertension: Secondary | ICD-10-CM | POA: Diagnosis not present

## 2017-04-20 DIAGNOSIS — R319 Hematuria, unspecified: Secondary | ICD-10-CM | POA: Diagnosis not present

## 2017-04-20 NOTE — Telephone Encounter (Signed)
Hg stable on home health lab-9.8- keep follow up next week

## 2017-04-21 DIAGNOSIS — I10 Essential (primary) hypertension: Secondary | ICD-10-CM | POA: Diagnosis not present

## 2017-04-21 DIAGNOSIS — I69351 Hemiplegia and hemiparesis following cerebral infarction affecting right dominant side: Secondary | ICD-10-CM | POA: Diagnosis not present

## 2017-04-21 DIAGNOSIS — I69322 Dysarthria following cerebral infarction: Secondary | ICD-10-CM | POA: Diagnosis not present

## 2017-04-21 DIAGNOSIS — I482 Chronic atrial fibrillation: Secondary | ICD-10-CM | POA: Diagnosis not present

## 2017-04-21 DIAGNOSIS — R319 Hematuria, unspecified: Secondary | ICD-10-CM | POA: Diagnosis not present

## 2017-04-21 DIAGNOSIS — R1310 Dysphagia, unspecified: Secondary | ICD-10-CM | POA: Diagnosis not present

## 2017-04-21 NOTE — Telephone Encounter (Signed)
Discussed with pt's daughter [name]. Has appt dec 5th

## 2017-04-22 DIAGNOSIS — R319 Hematuria, unspecified: Secondary | ICD-10-CM | POA: Diagnosis not present

## 2017-04-22 DIAGNOSIS — I10 Essential (primary) hypertension: Secondary | ICD-10-CM | POA: Diagnosis not present

## 2017-04-22 DIAGNOSIS — I69322 Dysarthria following cerebral infarction: Secondary | ICD-10-CM | POA: Diagnosis not present

## 2017-04-22 DIAGNOSIS — I69351 Hemiplegia and hemiparesis following cerebral infarction affecting right dominant side: Secondary | ICD-10-CM | POA: Diagnosis not present

## 2017-04-22 DIAGNOSIS — I482 Chronic atrial fibrillation: Secondary | ICD-10-CM | POA: Diagnosis not present

## 2017-04-22 DIAGNOSIS — R1310 Dysphagia, unspecified: Secondary | ICD-10-CM | POA: Diagnosis not present

## 2017-04-26 DIAGNOSIS — I69322 Dysarthria following cerebral infarction: Secondary | ICD-10-CM | POA: Diagnosis not present

## 2017-04-26 DIAGNOSIS — I10 Essential (primary) hypertension: Secondary | ICD-10-CM | POA: Diagnosis not present

## 2017-04-26 DIAGNOSIS — I69351 Hemiplegia and hemiparesis following cerebral infarction affecting right dominant side: Secondary | ICD-10-CM | POA: Diagnosis not present

## 2017-04-26 DIAGNOSIS — I482 Chronic atrial fibrillation: Secondary | ICD-10-CM | POA: Diagnosis not present

## 2017-04-26 DIAGNOSIS — R1310 Dysphagia, unspecified: Secondary | ICD-10-CM | POA: Diagnosis not present

## 2017-04-26 DIAGNOSIS — R319 Hematuria, unspecified: Secondary | ICD-10-CM | POA: Diagnosis not present

## 2017-04-27 DIAGNOSIS — I10 Essential (primary) hypertension: Secondary | ICD-10-CM | POA: Diagnosis not present

## 2017-04-27 DIAGNOSIS — I69351 Hemiplegia and hemiparesis following cerebral infarction affecting right dominant side: Secondary | ICD-10-CM | POA: Diagnosis not present

## 2017-04-27 DIAGNOSIS — I482 Chronic atrial fibrillation: Secondary | ICD-10-CM | POA: Diagnosis not present

## 2017-04-27 DIAGNOSIS — R319 Hematuria, unspecified: Secondary | ICD-10-CM | POA: Diagnosis not present

## 2017-04-27 DIAGNOSIS — I69322 Dysarthria following cerebral infarction: Secondary | ICD-10-CM | POA: Diagnosis not present

## 2017-04-27 DIAGNOSIS — R1310 Dysphagia, unspecified: Secondary | ICD-10-CM | POA: Diagnosis not present

## 2017-04-28 ENCOUNTER — Telehealth: Payer: Self-pay | Admitting: Family Medicine

## 2017-04-28 ENCOUNTER — Ambulatory Visit: Payer: Medicare Other | Admitting: Family Medicine

## 2017-04-28 DIAGNOSIS — R1310 Dysphagia, unspecified: Secondary | ICD-10-CM | POA: Diagnosis not present

## 2017-04-28 DIAGNOSIS — I69351 Hemiplegia and hemiparesis following cerebral infarction affecting right dominant side: Secondary | ICD-10-CM | POA: Diagnosis not present

## 2017-04-28 DIAGNOSIS — I10 Essential (primary) hypertension: Secondary | ICD-10-CM | POA: Diagnosis not present

## 2017-04-28 DIAGNOSIS — R319 Hematuria, unspecified: Secondary | ICD-10-CM | POA: Diagnosis not present

## 2017-04-28 DIAGNOSIS — I482 Chronic atrial fibrillation: Secondary | ICD-10-CM | POA: Diagnosis not present

## 2017-04-28 DIAGNOSIS — I69322 Dysarthria following cerebral infarction: Secondary | ICD-10-CM | POA: Diagnosis not present

## 2017-04-28 NOTE — Telephone Encounter (Signed)
Nurses-last time the family came by we had discussed Eliquis using it at the lower dose currently using which is 2.5 mg twice daily versus increasing the dose to 5 mg.  Please confirm did they want to stay at the lower dosing?  If they want to stay at the lower dosing I am fine with moving the appointment to follow-up somewhere within the next 30-60 days-and they can cancel today's appointment-if there are additional issues let me know

## 2017-04-28 NOTE — Telephone Encounter (Signed)
It should be noted that I discussed this case in detail with the family discussing no Eliquis versus 5 mg twice daily versus 2.5 twice daily.  They are aware that the 5 mg twice daily has a better stroke prevention plus also has higher risk of bleeding for which the patient did have significant hematuria and blood loss requiring units of blood.  Therefore they prefer to be on 2.5 twice a day they do not feel comfortable with stopping the medication

## 2017-04-28 NOTE — Telephone Encounter (Signed)
Daughter(POA) stated that he is currently on the lower dose of Eliquis and she would like him to stay on this dose for now. Daughter rescheduled patient's appointment for next month and to follow up sooner if any problems.

## 2017-04-28 NOTE — Telephone Encounter (Signed)
Daughter([name]) calling to see if we could cancel patient appointment today because he was just seen two weeks ago and daughter states he is doing fine no changes. And she didn't want to bring him out because its so cold the appointment is for 6 month follow up at 1:00pm

## 2017-04-29 DIAGNOSIS — R1310 Dysphagia, unspecified: Secondary | ICD-10-CM | POA: Diagnosis not present

## 2017-04-29 DIAGNOSIS — I482 Chronic atrial fibrillation: Secondary | ICD-10-CM | POA: Diagnosis not present

## 2017-04-29 DIAGNOSIS — I69322 Dysarthria following cerebral infarction: Secondary | ICD-10-CM | POA: Diagnosis not present

## 2017-04-29 DIAGNOSIS — I10 Essential (primary) hypertension: Secondary | ICD-10-CM | POA: Diagnosis not present

## 2017-04-29 DIAGNOSIS — I69351 Hemiplegia and hemiparesis following cerebral infarction affecting right dominant side: Secondary | ICD-10-CM | POA: Diagnosis not present

## 2017-04-29 DIAGNOSIS — R319 Hematuria, unspecified: Secondary | ICD-10-CM | POA: Diagnosis not present

## 2017-05-04 DIAGNOSIS — I69322 Dysarthria following cerebral infarction: Secondary | ICD-10-CM | POA: Diagnosis not present

## 2017-05-04 DIAGNOSIS — I10 Essential (primary) hypertension: Secondary | ICD-10-CM | POA: Diagnosis not present

## 2017-05-04 DIAGNOSIS — R319 Hematuria, unspecified: Secondary | ICD-10-CM | POA: Diagnosis not present

## 2017-05-04 DIAGNOSIS — I69351 Hemiplegia and hemiparesis following cerebral infarction affecting right dominant side: Secondary | ICD-10-CM | POA: Diagnosis not present

## 2017-05-04 DIAGNOSIS — R1310 Dysphagia, unspecified: Secondary | ICD-10-CM | POA: Diagnosis not present

## 2017-05-04 DIAGNOSIS — I482 Chronic atrial fibrillation: Secondary | ICD-10-CM | POA: Diagnosis not present

## 2017-05-05 DIAGNOSIS — R319 Hematuria, unspecified: Secondary | ICD-10-CM | POA: Diagnosis not present

## 2017-05-05 DIAGNOSIS — I69351 Hemiplegia and hemiparesis following cerebral infarction affecting right dominant side: Secondary | ICD-10-CM | POA: Diagnosis not present

## 2017-05-05 DIAGNOSIS — I69322 Dysarthria following cerebral infarction: Secondary | ICD-10-CM | POA: Diagnosis not present

## 2017-05-05 DIAGNOSIS — R1310 Dysphagia, unspecified: Secondary | ICD-10-CM | POA: Diagnosis not present

## 2017-05-05 DIAGNOSIS — I482 Chronic atrial fibrillation: Secondary | ICD-10-CM | POA: Diagnosis not present

## 2017-05-05 DIAGNOSIS — I10 Essential (primary) hypertension: Secondary | ICD-10-CM | POA: Diagnosis not present

## 2017-05-06 DIAGNOSIS — R1310 Dysphagia, unspecified: Secondary | ICD-10-CM | POA: Diagnosis not present

## 2017-05-06 DIAGNOSIS — I482 Chronic atrial fibrillation: Secondary | ICD-10-CM | POA: Diagnosis not present

## 2017-05-06 DIAGNOSIS — R319 Hematuria, unspecified: Secondary | ICD-10-CM | POA: Diagnosis not present

## 2017-05-06 DIAGNOSIS — I69351 Hemiplegia and hemiparesis following cerebral infarction affecting right dominant side: Secondary | ICD-10-CM | POA: Diagnosis not present

## 2017-05-06 DIAGNOSIS — I10 Essential (primary) hypertension: Secondary | ICD-10-CM | POA: Diagnosis not present

## 2017-05-06 DIAGNOSIS — I69322 Dysarthria following cerebral infarction: Secondary | ICD-10-CM | POA: Diagnosis not present

## 2017-05-10 DIAGNOSIS — R319 Hematuria, unspecified: Secondary | ICD-10-CM | POA: Diagnosis not present

## 2017-05-10 DIAGNOSIS — R1310 Dysphagia, unspecified: Secondary | ICD-10-CM | POA: Diagnosis not present

## 2017-05-10 DIAGNOSIS — I10 Essential (primary) hypertension: Secondary | ICD-10-CM | POA: Diagnosis not present

## 2017-05-10 DIAGNOSIS — I69351 Hemiplegia and hemiparesis following cerebral infarction affecting right dominant side: Secondary | ICD-10-CM | POA: Diagnosis not present

## 2017-05-10 DIAGNOSIS — I69322 Dysarthria following cerebral infarction: Secondary | ICD-10-CM | POA: Diagnosis not present

## 2017-05-10 DIAGNOSIS — I482 Chronic atrial fibrillation: Secondary | ICD-10-CM | POA: Diagnosis not present

## 2017-05-11 DIAGNOSIS — R1310 Dysphagia, unspecified: Secondary | ICD-10-CM | POA: Diagnosis not present

## 2017-05-11 DIAGNOSIS — I69351 Hemiplegia and hemiparesis following cerebral infarction affecting right dominant side: Secondary | ICD-10-CM | POA: Diagnosis not present

## 2017-05-11 DIAGNOSIS — I482 Chronic atrial fibrillation: Secondary | ICD-10-CM | POA: Diagnosis not present

## 2017-05-11 DIAGNOSIS — I10 Essential (primary) hypertension: Secondary | ICD-10-CM | POA: Diagnosis not present

## 2017-05-11 DIAGNOSIS — I69322 Dysarthria following cerebral infarction: Secondary | ICD-10-CM | POA: Diagnosis not present

## 2017-05-11 DIAGNOSIS — R319 Hematuria, unspecified: Secondary | ICD-10-CM | POA: Diagnosis not present

## 2017-05-12 ENCOUNTER — Ambulatory Visit (INDEPENDENT_AMBULATORY_CARE_PROVIDER_SITE_OTHER): Payer: Medicare Other | Admitting: Urology

## 2017-05-12 ENCOUNTER — Telehealth: Payer: Self-pay | Admitting: Family Medicine

## 2017-05-12 DIAGNOSIS — I69322 Dysarthria following cerebral infarction: Secondary | ICD-10-CM | POA: Diagnosis not present

## 2017-05-12 DIAGNOSIS — I10 Essential (primary) hypertension: Secondary | ICD-10-CM | POA: Diagnosis not present

## 2017-05-12 DIAGNOSIS — I69351 Hemiplegia and hemiparesis following cerebral infarction affecting right dominant side: Secondary | ICD-10-CM | POA: Diagnosis not present

## 2017-05-12 DIAGNOSIS — R338 Other retention of urine: Secondary | ICD-10-CM

## 2017-05-12 DIAGNOSIS — R310 Gross hematuria: Secondary | ICD-10-CM | POA: Diagnosis not present

## 2017-05-12 DIAGNOSIS — I482 Chronic atrial fibrillation: Secondary | ICD-10-CM | POA: Diagnosis not present

## 2017-05-12 DIAGNOSIS — R319 Hematuria, unspecified: Secondary | ICD-10-CM | POA: Diagnosis not present

## 2017-05-12 DIAGNOSIS — R1310 Dysphagia, unspecified: Secondary | ICD-10-CM | POA: Diagnosis not present

## 2017-05-12 NOTE — Telephone Encounter (Signed)
It would be fine to extend this

## 2017-05-12 NOTE — Telephone Encounter (Signed)
[facility] calling needing a verbal ok to extend speech therapy, 2 times per week for 3 more weeks due to stroke.  Please call Tylene Fantasia at [phone] and leave a voice message with verbal ok.

## 2017-05-12 NOTE — Telephone Encounter (Signed)
Verbal order given 05/12/17

## 2017-05-13 DIAGNOSIS — I482 Chronic atrial fibrillation: Secondary | ICD-10-CM | POA: Diagnosis not present

## 2017-05-13 DIAGNOSIS — I10 Essential (primary) hypertension: Secondary | ICD-10-CM | POA: Diagnosis not present

## 2017-05-13 DIAGNOSIS — I69322 Dysarthria following cerebral infarction: Secondary | ICD-10-CM | POA: Diagnosis not present

## 2017-05-13 DIAGNOSIS — R1310 Dysphagia, unspecified: Secondary | ICD-10-CM | POA: Diagnosis not present

## 2017-05-13 DIAGNOSIS — R319 Hematuria, unspecified: Secondary | ICD-10-CM | POA: Diagnosis not present

## 2017-05-13 DIAGNOSIS — I69351 Hemiplegia and hemiparesis following cerebral infarction affecting right dominant side: Secondary | ICD-10-CM | POA: Diagnosis not present

## 2017-05-19 DIAGNOSIS — I482 Chronic atrial fibrillation: Secondary | ICD-10-CM | POA: Diagnosis not present

## 2017-05-19 DIAGNOSIS — I69322 Dysarthria following cerebral infarction: Secondary | ICD-10-CM | POA: Diagnosis not present

## 2017-05-19 DIAGNOSIS — R319 Hematuria, unspecified: Secondary | ICD-10-CM | POA: Diagnosis not present

## 2017-05-19 DIAGNOSIS — I10 Essential (primary) hypertension: Secondary | ICD-10-CM | POA: Diagnosis not present

## 2017-05-19 DIAGNOSIS — R1310 Dysphagia, unspecified: Secondary | ICD-10-CM | POA: Diagnosis not present

## 2017-05-19 DIAGNOSIS — I69351 Hemiplegia and hemiparesis following cerebral infarction affecting right dominant side: Secondary | ICD-10-CM | POA: Diagnosis not present

## 2017-05-20 DIAGNOSIS — I10 Essential (primary) hypertension: Secondary | ICD-10-CM | POA: Diagnosis not present

## 2017-05-20 DIAGNOSIS — R1310 Dysphagia, unspecified: Secondary | ICD-10-CM | POA: Diagnosis not present

## 2017-05-20 DIAGNOSIS — I482 Chronic atrial fibrillation: Secondary | ICD-10-CM | POA: Diagnosis not present

## 2017-05-20 DIAGNOSIS — I69322 Dysarthria following cerebral infarction: Secondary | ICD-10-CM | POA: Diagnosis not present

## 2017-05-20 DIAGNOSIS — R319 Hematuria, unspecified: Secondary | ICD-10-CM | POA: Diagnosis not present

## 2017-05-20 DIAGNOSIS — I69351 Hemiplegia and hemiparesis following cerebral infarction affecting right dominant side: Secondary | ICD-10-CM | POA: Diagnosis not present

## 2017-05-26 ENCOUNTER — Other Ambulatory Visit: Payer: Self-pay | Admitting: Family Medicine

## 2017-05-26 DIAGNOSIS — I482 Chronic atrial fibrillation: Secondary | ICD-10-CM | POA: Diagnosis not present

## 2017-05-26 DIAGNOSIS — I69351 Hemiplegia and hemiparesis following cerebral infarction affecting right dominant side: Secondary | ICD-10-CM | POA: Diagnosis not present

## 2017-05-26 DIAGNOSIS — R1310 Dysphagia, unspecified: Secondary | ICD-10-CM | POA: Diagnosis not present

## 2017-05-26 DIAGNOSIS — I69322 Dysarthria following cerebral infarction: Secondary | ICD-10-CM | POA: Diagnosis not present

## 2017-05-26 DIAGNOSIS — I10 Essential (primary) hypertension: Secondary | ICD-10-CM | POA: Diagnosis not present

## 2017-05-26 DIAGNOSIS — R319 Hematuria, unspecified: Secondary | ICD-10-CM | POA: Diagnosis not present

## 2017-05-26 NOTE — Telephone Encounter (Signed)
Verify with daughter that he is taking if he is then may have 6 months

## 2017-05-27 NOTE — Telephone Encounter (Signed)
I called and left a message asked that r/c.

## 2017-05-28 ENCOUNTER — Other Ambulatory Visit: Payer: Self-pay

## 2017-05-28 DIAGNOSIS — I10 Essential (primary) hypertension: Secondary | ICD-10-CM | POA: Diagnosis not present

## 2017-05-28 DIAGNOSIS — I69322 Dysarthria following cerebral infarction: Secondary | ICD-10-CM | POA: Diagnosis not present

## 2017-05-28 DIAGNOSIS — R319 Hematuria, unspecified: Secondary | ICD-10-CM | POA: Diagnosis not present

## 2017-05-28 DIAGNOSIS — I482 Chronic atrial fibrillation: Secondary | ICD-10-CM | POA: Diagnosis not present

## 2017-05-28 DIAGNOSIS — R1310 Dysphagia, unspecified: Secondary | ICD-10-CM | POA: Diagnosis not present

## 2017-05-28 DIAGNOSIS — I69351 Hemiplegia and hemiparesis following cerebral infarction affecting right dominant side: Secondary | ICD-10-CM | POA: Diagnosis not present

## 2017-05-31 ENCOUNTER — Ambulatory Visit: Payer: Medicare Other | Admitting: Family Medicine

## 2017-05-31 DIAGNOSIS — I10 Essential (primary) hypertension: Secondary | ICD-10-CM | POA: Diagnosis not present

## 2017-05-31 DIAGNOSIS — I482 Chronic atrial fibrillation: Secondary | ICD-10-CM | POA: Diagnosis not present

## 2017-05-31 DIAGNOSIS — I69322 Dysarthria following cerebral infarction: Secondary | ICD-10-CM | POA: Diagnosis not present

## 2017-05-31 DIAGNOSIS — I69351 Hemiplegia and hemiparesis following cerebral infarction affecting right dominant side: Secondary | ICD-10-CM | POA: Diagnosis not present

## 2017-05-31 DIAGNOSIS — R319 Hematuria, unspecified: Secondary | ICD-10-CM | POA: Diagnosis not present

## 2017-05-31 DIAGNOSIS — R1310 Dysphagia, unspecified: Secondary | ICD-10-CM | POA: Diagnosis not present

## 2017-06-02 ENCOUNTER — Other Ambulatory Visit: Payer: Self-pay

## 2017-06-02 DIAGNOSIS — I482 Chronic atrial fibrillation: Secondary | ICD-10-CM | POA: Diagnosis not present

## 2017-06-02 DIAGNOSIS — I10 Essential (primary) hypertension: Secondary | ICD-10-CM | POA: Diagnosis not present

## 2017-06-02 DIAGNOSIS — I69351 Hemiplegia and hemiparesis following cerebral infarction affecting right dominant side: Secondary | ICD-10-CM | POA: Diagnosis not present

## 2017-06-02 DIAGNOSIS — I69322 Dysarthria following cerebral infarction: Secondary | ICD-10-CM | POA: Diagnosis not present

## 2017-06-02 DIAGNOSIS — R319 Hematuria, unspecified: Secondary | ICD-10-CM | POA: Diagnosis not present

## 2017-06-02 DIAGNOSIS — R1310 Dysphagia, unspecified: Secondary | ICD-10-CM | POA: Diagnosis not present

## 2017-06-02 NOTE — Patient Outreach (Signed)
Telephone outreach to patient to obtain mRS was successfully completed. mRS = 4 

## 2017-06-10 ENCOUNTER — Telehealth: Payer: Self-pay | Admitting: Family Medicine

## 2017-06-10 ENCOUNTER — Encounter (HOSPITAL_COMMUNITY): Payer: Self-pay | Admitting: Emergency Medicine

## 2017-06-10 ENCOUNTER — Other Ambulatory Visit: Payer: Self-pay

## 2017-06-10 ENCOUNTER — Emergency Department (HOSPITAL_COMMUNITY)
Admission: EM | Admit: 2017-06-10 | Discharge: 2017-06-10 | Disposition: A | Discharge status: Home / Self Care | Payer: Medicare Other | Attending: Emergency Medicine | Admitting: Emergency Medicine

## 2017-06-10 DIAGNOSIS — T83031A Leakage of indwelling urethral catheter, initial encounter: Secondary | ICD-10-CM | POA: Insufficient documentation

## 2017-06-10 DIAGNOSIS — I4891 Unspecified atrial fibrillation: Secondary | ICD-10-CM | POA: Diagnosis not present

## 2017-06-10 DIAGNOSIS — T839XXA Unspecified complication of genitourinary prosthetic device, implant and graft, initial encounter: Secondary | ICD-10-CM

## 2017-06-10 DIAGNOSIS — E039 Hypothyroidism, unspecified: Secondary | ICD-10-CM | POA: Insufficient documentation

## 2017-06-10 DIAGNOSIS — Z87891 Personal history of nicotine dependence: Secondary | ICD-10-CM | POA: Diagnosis not present

## 2017-06-10 DIAGNOSIS — Y69 Unspecified misadventure during surgical and medical care: Secondary | ICD-10-CM | POA: Insufficient documentation

## 2017-06-10 DIAGNOSIS — I10 Essential (primary) hypertension: Secondary | ICD-10-CM | POA: Insufficient documentation

## 2017-06-10 DIAGNOSIS — Z96642 Presence of left artificial hip joint: Secondary | ICD-10-CM | POA: Diagnosis not present

## 2017-06-10 DIAGNOSIS — Z79899 Other long term (current) drug therapy: Secondary | ICD-10-CM | POA: Insufficient documentation

## 2017-06-10 DIAGNOSIS — T83098A Other mechanical complication of other indwelling urethral catheter, initial encounter: Secondary | ICD-10-CM | POA: Diagnosis not present

## 2017-06-10 MED ORDER — AMLODIPINE BESYLATE 5 MG PO TABS: 5.0000 mg | ORAL_TABLET | Freq: Every day | ORAL | 5 refills | Status: DC

## 2017-06-10 MED ORDER — METOPROLOL SUCCINATE ER 25 MG PO TB24: 25.0000 mg | ORAL_TABLET | Freq: Every evening | ORAL | 5 refills | Status: DC

## 2017-06-10 NOTE — ED Provider Notes (Signed)
[facility] EMERGENCY DEPARTMENT Provider Note   CSN: [id number] Arrival date & time: 06/10/17  1660     History   Chief Complaint Chief Complaint  Patient presents with  . Folley catheter    HPI Ethan Faulkner is a 84 y.o. male with a history as outlined below, significant for distant history of prostate cancer with residual long-term Foley catheter presenting for evaluation with a catheter problem.  He was rolling over in bed this morning when the tubing broke.  Caregiver is concerned about continuing problems and leaking from around the tubing.  He has had no fevers or chills, he does have intermittent chronic blood in his Foley bag  which is not new or different.  He is scheduled to see his urologist next week for routine follow-up at which time his Foley would be changed.  The history is provided by a caregiver and the patient.    Past Medical History:  Diagnosis Date  . Afib ([place])   . Atrial fibrillation ([place])   . Cerebral infarction ([place])   . DDD (degenerative disc disease), lumbar   . Dysphagia   . Hypertension   . Hypothyroidism   . Migraine    "maybe monthly" (11/21/2015)  . Prostate cancer ([place]) ~ 1995   S/P radiation tx  . Renal mass    "slow growing something; doctors just watching it right now" (11/21/2015)  . Stroke [facility])    TIA  . Thrombocytopenia ([place])   . Thyroid disease    hypothyroid    Patient Active Problem List   Diagnosis Date Noted  . Gross hematuria 04/06/2017  . History of stroke 04/06/2017  . Hematuria, unspecified 02/19/2017  . Cerebrovascular accident (CVA) due to embolism of left middle cerebral artery ([place]) 02/14/2017  . 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 TIA (transient ischemic attack) 05/21/2016  . Leukopenia   . Thrombocytopenia ([place])   . Fall   . Acute blood loss anemia   . Femoral neck fracture ([place]) 11/20/2015  . Renal malignant tumor ([place]) 06/06/2015  . Long-term (current) use of  anticoagulants 04/29/2015  . Subclinical hypothyroidism 03/01/2014  . Atrial fibrillation ([place]) 06/09/2013  . STRESS FRACTURE, FOOT 12/12/2008  . CLOSED FRACTURE OF METATARSAL BONE 12/12/2008    Past Surgical History:  Procedure Laterality Date  . COLONOSCOPY  1999  . ESOPHAGOGASTRODUODENOSCOPY    . LAPAROSCOPIC CHOLECYSTECTOMY  1980s  . PROSTATE BIOPSY  ~ 1995  . TOTAL HIP ARTHROPLASTY Left 11/22/2015   Procedure: LEFT HIP HEMIARTHROPLASTY ;  Surgeon: Leandrew Koyanagi, MD;  Location: [facility];  Service: Orthopedics;  Laterality: Left;       Home Medications    Prior to Admission medications   Medication Sig Start Date End Date Taking? Authorizing Provider  acetaminophen (TYLENOL) 325 MG tablet Take 325-650 mg by mouth every 6 (six) hours as needed for mild pain or moderate pain.    [provider]  amLODipine (NORVASC) 5 MG tablet Take 1 tablet (5 mg total) by mouth daily. 02/20/17   Patteson, Arlan Organ, NP  apixaban (ELIQUIS) 2.5 MG TABS tablet Take 2.5 mg by mouth 2 (two) times daily.    [provider]  atorvastatin (LIPITOR) 20 MG tablet TAKE 1 TABLET BY MOUTH ONCE DAILY AT 6:00 PM. 06/02/17   Kathyrn Drown, MD  cetirizine (ZYRTEC) 10 MG tablet Take 10 mg by mouth every evening.     [provider]  ketoconazole (NIZORAL) 2 % cream Apply  1 application 2 (two) times daily topically. 04/12/17   Kathyrn Drown, MD  levothyroxine (SYNTHROID, LEVOTHROID) 25 MCG tablet Take 1 tablet (25 mcg total) by mouth daily before breakfast. 02/20/17   Patteson, Arlan Organ, NP  metoprolol succinate (TOPROL-XL) 25 MG 24 hr tablet Take 1 tablet (25 mg total) by mouth every evening. 02/19/17   Patteson, Arlan Organ, NP  polyethylene glycol powder (GLYCOLAX/MIRALAX) powder MIX 1 CAPFUL IN 8 OZ OF WATER AND DRINK twice weekly 02/19/17   Charm Rings, NP    Family History Family History  Problem Relation Age of Onset  . Stroke Sister        in her 64s  . CAD Neg Hx   . COPD Neg  Hx   . Diabetes Neg Hx   . Heart disease Neg Hx     Social History Social History   Tobacco Use  . Smoking status: Former Research scientist (life sciences)  . Smokeless tobacco: Never Used  . Tobacco comment: 11/21/2015 "might have smoked 2 packs of cigarettes in my lifetime"  Substance Use Topics  . Alcohol use: Yes    Comment: 11/21/2015 "last beer I've had was in ~ 2007"  . Drug use: No     Allergies   Xanax [alprazolam] and Tape   Review of Systems Review of Systems  Genitourinary: Negative for penile pain.  All other systems reviewed and are negative.    Physical Exam Updated Vital Signs BP 119/63 (BP Location: Left Arm)   Pulse 77   Temp 97.7 F (36.5 C) (Temporal)   Resp 16   Ht 5\' 9"  (1.753 m)   Wt 70.8 kg (156 lb)   SpO2 98%   BMI 23.04 kg/m   Physical Exam  Constitutional: He appears well-developed and well-nourished. No distress.  Cardiovascular: Normal rate.  Pulmonary/Chest: Effort normal.  Abdominal: Soft. He exhibits no distension and no mass. There is no tenderness. There is no guarding.  Genitourinary: Penis normal. No penile tenderness.  Genitourinary Comments: No obvious penile trauma.  Foley catheter tubing separated from the lower tubing piece at the hub.      ED Treatments / Results  Labs (all labs ordered are listed, but only abnormal results are displayed) Labs Reviewed - No data to display  EKG  EKG Interpretation None       Radiology No results found.  Procedures Procedures (including critical care time)  Medications Ordered in ED Medications - No data to display   Initial Impression / Assessment and Plan / ED Course  I have reviewed the triage vital signs and the nursing notes.  Pertinent labs & imaging results that were available during my care of the patient were reviewed by me and considered in my medical decision making (see chart for details).     Tubing was reinserted with no apparent leakage.  Caregiver raised concerns about  possibility of continued leaking and/or infection developing from todays event.  She reports he is supposed to have a foley change next week, requests this be done today.    Foley cath was changed.  Discussed with Dr. Eulis Foster who saw pt prior to dc.  Final Clinical Impressions(s) / ED Diagnoses   Final diagnoses:  Foley catheter problem, initial encounter [facility])    ED Discharge Orders    None       Landis Martins 06/10/17 1125    Daleen Bo, MD 06/10/17 1558

## 2017-06-10 NOTE — Telephone Encounter (Signed)
Patient dtr Ethan Faulkner is aware we have sent in to [facility].

## 2017-06-10 NOTE — Telephone Encounter (Signed)
Requesting refill on metoprolol and amlodipine.  [place]

## 2017-06-10 NOTE — Telephone Encounter (Signed)
Daughter is requesting this sent today.

## 2017-06-10 NOTE — Telephone Encounter (Signed)
Ok six mo ref 

## 2017-06-10 NOTE — ED Notes (Signed)
Pt's family denied needing assistance getting pt into wheelchair and out.  Thanked [place] extensively for our help.  Forgot to sign for d/c instructions, but verbalized understanding.

## 2017-06-10 NOTE — ED Triage Notes (Signed)
Patient has history of dementia and polled tubing off of foley catheter this morning. Patient is alert to person and place care giver states this is baseline.

## 2017-06-10 NOTE — Telephone Encounter (Signed)
Last prescribed by Dr Ellison Hughs 01/2017

## 2017-06-14 ENCOUNTER — Other Ambulatory Visit: Payer: Self-pay | Admitting: Family Medicine

## 2017-06-29 ENCOUNTER — Other Ambulatory Visit: Payer: Self-pay | Admitting: Family Medicine

## 2017-07-05 ENCOUNTER — Telehealth: Payer: Self-pay | Admitting: Family Medicine

## 2017-07-05 NOTE — Telephone Encounter (Signed)
May use tramadol 50 mg 1 3 times daily as needed severe pain cautioned drowsiness please let family member knows that this medication can help with pain but does stand a slight chance of causing drowsiness and very unlikely chance of causing confusion.  I would only use it when pain seems severe.  #21 with 1 refill

## 2017-07-05 NOTE — Telephone Encounter (Signed)
Daughter(POA) advised that they may use tramadol 50 mg 1 tab 3 times daily as needed severe pain cautioned drowsiness please let family member knows that this medication can help with pain but does stand a slight chance of causing drowsiness and very unlikely chance of causing confusion. Only use it when pain seems severe.  #21 with 1 refill. Daughter verbalized understanding. Prescription called into pharmacy.

## 2017-07-05 NOTE — Telephone Encounter (Signed)
Patient fell this past Saturday on his bottom.  Barbera Setters said that he seems to be walking around fine.  She said every now and then he will grimace like he is feeling some type of pain.  Sheri wants to know if there is something that he can take stronger that Tylenol or that we can call in for him?  She said she doesn't want to bring him out in the weather.  [place]

## 2017-07-12 ENCOUNTER — Telehealth: Payer: Self-pay

## 2017-07-12 ENCOUNTER — Ambulatory Visit: Payer: Medicare Other | Admitting: Neurology

## 2017-07-12 NOTE — Telephone Encounter (Signed)
Patient no show for appt today. 

## 2017-07-15 ENCOUNTER — Encounter: Payer: Self-pay | Admitting: Neurology

## 2017-07-23 ENCOUNTER — Ambulatory Visit (INDEPENDENT_AMBULATORY_CARE_PROVIDER_SITE_OTHER): Payer: Medicare Other | Admitting: Urology

## 2017-07-23 DIAGNOSIS — R310 Gross hematuria: Secondary | ICD-10-CM

## 2017-07-23 DIAGNOSIS — N3040 Irradiation cystitis without hematuria: Secondary | ICD-10-CM | POA: Diagnosis not present

## 2017-07-23 DIAGNOSIS — R338 Other retention of urine: Secondary | ICD-10-CM | POA: Diagnosis not present

## 2017-08-10 ENCOUNTER — Ambulatory Visit: Payer: Medicare Other | Admitting: Family Medicine

## 2017-08-10 ENCOUNTER — Telehealth: Payer: Self-pay | Admitting: Family Medicine

## 2017-08-10 NOTE — Telephone Encounter (Signed)
1.  May cancel the appointment #2 I would just watch things over the course of the next 24-48 hours.  If he seems to be having some health issues I would recommend calling back and we will work him in

## 2017-08-10 NOTE — Telephone Encounter (Signed)
Patients daughter, [name], is requesting Dr. Nicki Reaper to give her a call back.  He has been having some stomach and back issues she would like to discuss with Dr. Nicki Reaper.  She originally scheduled an appointment for today, but cancelled due to the weather and not wanting to bring him out.

## 2017-08-10 NOTE — Telephone Encounter (Signed)
Discussed with pt's daughter [name]. [name] verbalized understanding.

## 2017-08-10 NOTE — Telephone Encounter (Signed)
Does not want to bring in pt due to the flu. Pt fell 2 -3 weeks ago on his bottom. Seemed fine after fall. Has been walking around fine. Sometimes he says his lower stomach and low back hurt. Was having trouble with constipation but had a normal stool yesterday. No pain today. Cath was leaking last week and had an odor to urine. They fixed the problem with the leak and urine is back to normal now. No odor. No fever. Called urologist today and they suggested to follow up with dr scott. Daughter does not want to bring him out during flu season. Daughter was thinking the issue was something urinary and that why she called urologist. Daughter also states that he is fine today and she wonders if he is just complaining to get attention because her and her sister were out of town last week.

## 2017-08-24 ENCOUNTER — Ambulatory Visit: Payer: Medicare Other | Admitting: Neurology

## 2017-08-26 DIAGNOSIS — R338 Other retention of urine: Secondary | ICD-10-CM | POA: Diagnosis not present

## 2017-09-13 ENCOUNTER — Other Ambulatory Visit: Payer: Self-pay | Admitting: Family Medicine

## 2017-10-01 ENCOUNTER — Ambulatory Visit (INDEPENDENT_AMBULATORY_CARE_PROVIDER_SITE_OTHER): Payer: Medicare Other | Admitting: Urology

## 2017-10-01 DIAGNOSIS — R310 Gross hematuria: Secondary | ICD-10-CM | POA: Diagnosis not present

## 2017-10-11 ENCOUNTER — Other Ambulatory Visit: Payer: Self-pay | Admitting: Family Medicine

## 2017-10-12 ENCOUNTER — Encounter: Payer: Self-pay | Admitting: Family Medicine

## 2017-10-12 ENCOUNTER — Ambulatory Visit (INDEPENDENT_AMBULATORY_CARE_PROVIDER_SITE_OTHER): Payer: Medicare Other | Admitting: Family Medicine

## 2017-10-12 VITALS — BP 122/70 | Temp 98.4°F

## 2017-10-12 DIAGNOSIS — K921 Melena: Secondary | ICD-10-CM | POA: Diagnosis not present

## 2017-10-12 LAB — POCT HEMOGLOBIN: HEMOGLOBIN: 12.9 g/dL — AB (ref 14.1–18.1)

## 2017-10-12 NOTE — Progress Notes (Signed)
   Subjective:    Patient ID: Ethan Faulkner, male    DOB: 01/31/1916, 84 y.o.   MRN: [id number]  HPIdaughter noticied bright red blood in underwood and on pad on bed. Saw some when wiping with his stool. Having some rectal pain.   patient with some bright red in the stools Patient has had a stroke Cannot communicate well He does relate intermittent pain in the lower pelvis but he also has a urinary catheter that gives him some discomfort according to family his bowel movements typically are easy to happen but recently have been constipated with some moderate straining   Review of Systems  Constitutional: Negative for activity change.  HENT: Negative for congestion and rhinorrhea.   Respiratory: Negative for cough and shortness of breath.   Cardiovascular: Negative for chest pain.  Gastrointestinal: Positive for blood in stool. Negative for abdominal pain, diarrhea, nausea and vomiting.  Genitourinary: Negative for dysuria and hematuria.  Neurological: Negative for weakness and headaches.  Psychiatric/Behavioral: Negative for confusion.       Objective:   Physical Exam  Constitutional: He appears well-developed and well-nourished. No distress.  HENT:  Head: Normocephalic and atraumatic.  Eyes: Right eye exhibits no discharge. Left eye exhibits no discharge.  Neck: No tracheal deviation present.  Cardiovascular: Normal rate, regular rhythm and normal heart sounds.  No murmur heard. Pulmonary/Chest: Effort normal and breath sounds normal. No respiratory distress. He has no wheezes.  Abdominal: Soft. He exhibits no distension. There is no tenderness.  Genitourinary: Rectal exam shows guaiac positive stool.  Musculoskeletal: He exhibits no edema or deformity.  Lymphadenopathy:    He has no cervical adenopathy.  Neurological: He is alert. Coordination normal.  Skin: Skin is warm and dry.  Psychiatric: His behavior is normal.  Vitals reviewed.         Assessment & Plan:    Blood in stool Hemoglobin good Patient hemodynamically stable Continue Eliquis If larger amounts of blood in stool may need to stop Eliquis Benefit of continuing medication outweighs risk More than likely blood around stool related to constipation issues  Patient having intermittent pain related to the catheter but at the same time patient current status such to where putting him through CAT scans of abdominal pelvis not indicated currently in accordance with family's wishes to keep testing to only absolute necessities  Family will give [place] an update within 7 to 10 days Patient will use MiraLAX every other day to try to keep bowel movements soft half capful in 8 ounces of water

## 2017-10-15 ENCOUNTER — Ambulatory Visit: Payer: Medicare Other | Admitting: Family Medicine

## 2017-10-20 ENCOUNTER — Telehealth: Payer: Self-pay | Admitting: Family Medicine

## 2017-10-20 NOTE — Telephone Encounter (Signed)
I have started on this form.  He did have some questions regarding how much help he is having at home which it would be very beneficial for the family to give Ethan Faulkner some additional insight so the form can accurately reflect his capabilities.  Please see form talk with [name] go through the various questions that I have provided me with these answers then I can write them into the form thank you

## 2017-10-20 NOTE — Telephone Encounter (Signed)
Daughter-[name] dropped form from [place] to be filled out last week.She was calling to check on them.

## 2017-10-20 NOTE — Telephone Encounter (Signed)
This form was filled out based upon our knowledge of the patient as well as input from family.  Please complete the form with our office address and phone number.  Please also let [name] know that the form is ready for their pickup and review if it needs any additional amendments please let us know thank you

## 2017-10-20 NOTE — Telephone Encounter (Signed)
Spoke with [name]; form in provider office

## 2017-10-21 NOTE — Telephone Encounter (Signed)
Left message to return call with daughter.

## 2017-10-22 NOTE — Telephone Encounter (Signed)
Daughter picked up form

## 2017-10-29 ENCOUNTER — Ambulatory Visit (INDEPENDENT_AMBULATORY_CARE_PROVIDER_SITE_OTHER): Payer: Medicare Other | Admitting: Urology

## 2017-10-29 DIAGNOSIS — R310 Gross hematuria: Secondary | ICD-10-CM

## 2017-11-03 ENCOUNTER — Telehealth: Payer: Self-pay | Admitting: Family Medicine

## 2017-11-03 NOTE — Telephone Encounter (Signed)
Patients daughter, [name], wants to know if we would be able to write an order for a lift chair?  If so, she would like to pick this up so that he can be reimbursed.

## 2017-11-03 NOTE — Telephone Encounter (Signed)
Please put stroke, hemiplegia-it would be fine to go ahead with a prescription for a lift chair

## 2017-11-03 NOTE — Telephone Encounter (Signed)
rx ready for pickup. Daughter [name] notified.

## 2017-11-03 NOTE — Telephone Encounter (Signed)
If you agree what diagnosis do you want to put on rx

## 2017-11-09 ENCOUNTER — Ambulatory Visit (INDEPENDENT_AMBULATORY_CARE_PROVIDER_SITE_OTHER): Payer: Medicare Other | Admitting: Family Medicine

## 2017-11-09 ENCOUNTER — Encounter: Payer: Self-pay | Admitting: Family Medicine

## 2017-11-09 VITALS — BP 118/68 | Temp 98.4°F | Ht 69.0 in

## 2017-11-09 DIAGNOSIS — E7849 Other hyperlipidemia: Secondary | ICD-10-CM

## 2017-11-09 DIAGNOSIS — E038 Other specified hypothyroidism: Secondary | ICD-10-CM

## 2017-11-09 DIAGNOSIS — D509 Iron deficiency anemia, unspecified: Secondary | ICD-10-CM | POA: Diagnosis not present

## 2017-11-09 DIAGNOSIS — I482 Chronic atrial fibrillation, unspecified: Secondary | ICD-10-CM

## 2017-11-09 DIAGNOSIS — J301 Allergic rhinitis due to pollen: Secondary | ICD-10-CM | POA: Diagnosis not present

## 2017-11-09 DIAGNOSIS — E785 Hyperlipidemia, unspecified: Secondary | ICD-10-CM | POA: Insufficient documentation

## 2017-11-09 DIAGNOSIS — E039 Hypothyroidism, unspecified: Secondary | ICD-10-CM | POA: Diagnosis not present

## 2017-11-09 NOTE — Progress Notes (Signed)
   Subjective:    Patient ID: Ethan Faulkner, male    DOB: 1916/04/29, 84 y.o.   MRN: [id number]  HPIFollow up blood in stool. Family has not seen any more blood.  No constipation motions bowel movements in the normal no abdominal pain no change in appetite no bleeding issues Relates intermittent head congestion drainage coughing no wheezing no high fever phlegm is clear looking Having cough and congestion. Started a few days ago.     Review of Systems  Constitutional: Negative for activity change, fatigue and fever.  HENT: Negative for congestion and rhinorrhea.   Respiratory: Negative for cough and shortness of breath.   Cardiovascular: Negative for chest pain and leg swelling.  Gastrointestinal: Negative for abdominal pain, diarrhea and nausea.  Genitourinary: Negative for dysuria and hematuria.  Neurological: Negative for weakness and headaches.  Psychiatric/Behavioral: Negative for behavioral problems.       Objective:   Physical Exam  Constitutional: He appears well-nourished. No distress.  Cardiovascular: Normal rate and normal heart sounds.  No murmur heard. Pulmonary/Chest: Effort normal and breath sounds normal. No respiratory distress.  Musculoskeletal: He exhibits no edema.  Lymphadenopathy:    He has no cervical adenopathy.  Neurological: He is alert.  Psychiatric: His behavior is normal.  Vitals reviewed.         Assessment & Plan:  Allergy issues Viral likely at most No sign of bacterial Allergy medications recommended Family does not think patient would tolerate allergy nasal spray Antibiotics not indicated  Rectal bleeding has stopped we will do follow-up lab work before next visit in 3 months  No sign of any stroke continue current treatments

## 2017-11-15 ENCOUNTER — Other Ambulatory Visit: Payer: Self-pay | Admitting: Family Medicine

## 2017-11-22 ENCOUNTER — Other Ambulatory Visit: Payer: Self-pay | Admitting: Family Medicine

## 2017-11-29 IMAGING — RF DG SWALLOWING FUNCTION
15 of 16 series · 19 of 24 positions shown · non-contrast
Comparison: None

CLINICAL DATA: Dysphagia, atrial fibrillation, weakness, history
stroke

EXAM:
MODIFIED BARIUM SWALLOW
TECHNIQUE: Different consistencies of barium were administered orally to the
patient by the Speech Pathologist. Imaging of the pharynx was
performed in the lateral projection.
FLUOROSCOPY TIME:  Fluoroscopy Time:  4 minutes 18 seconds
Radiation Exposure Index (if provided by the fluoroscopic device):
18.3 mGy
Number of Acquired Spot Images: multiple fluoroscopic screen
captures

[Series 1: cp_standard · 0.27mm/px · 1 of 1 slices shown (1 of 15)]
[im 1/1]
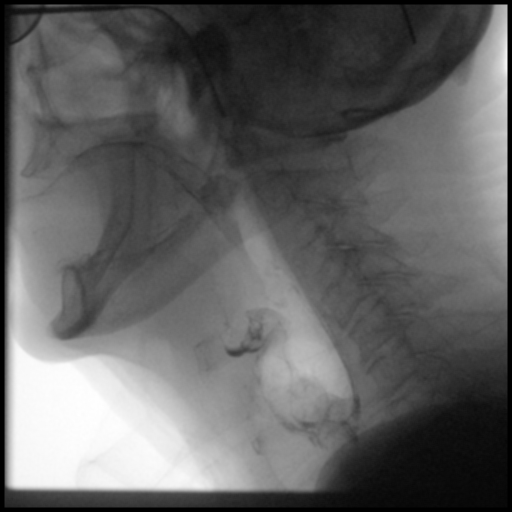

[Series 4: cp_standard · 0.27mm/px · 1 of 1 slices shown (2 of 15)]
[im 1/1]
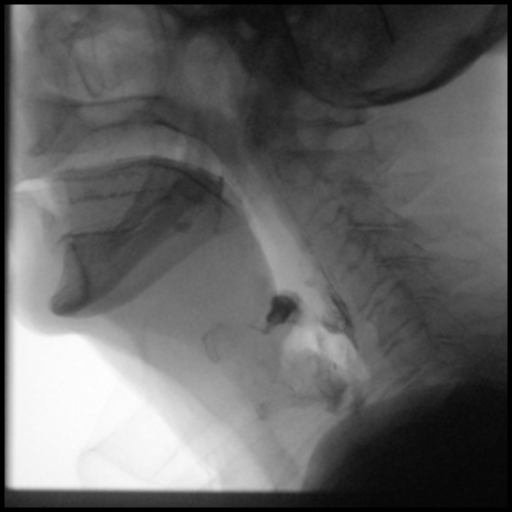

[Series 6: cp_standard · 0.27mm/px · 1 of 2 slices shown (3 of 15)]
[im 2/2]
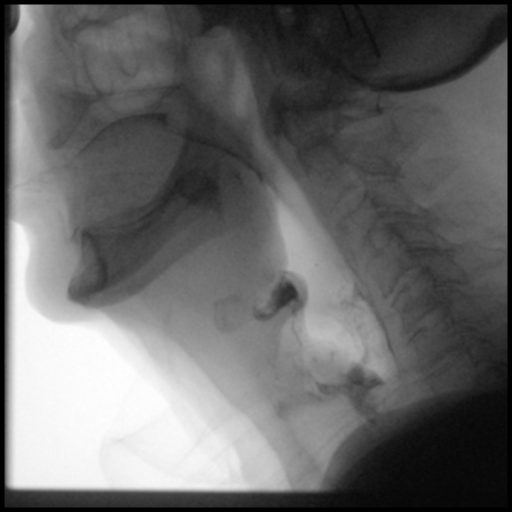

[Series 8: cp_standard · 0.27mm/px · 2 of 145 frames shown (4 of 15)]
[frame 22/145]
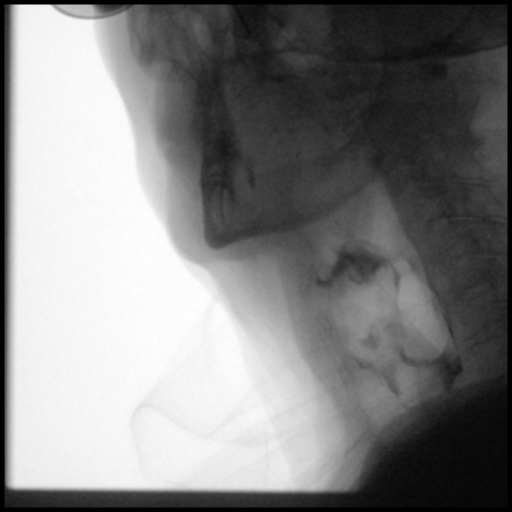
[frame 124/145]
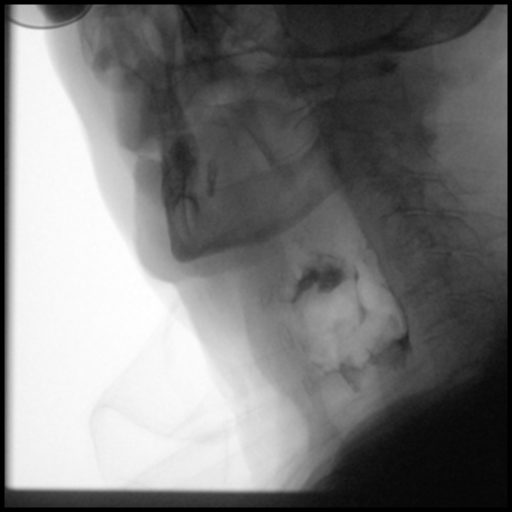

[Series 9: cp_standard · 0.27mm/px · 1 of 208 frames shown (5 of 15)]
[frame 177/208]
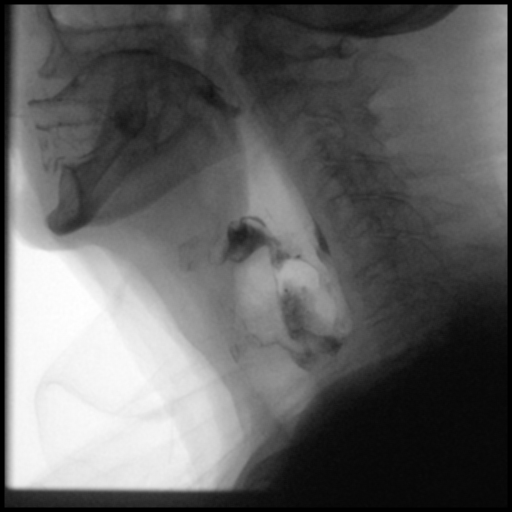

[Series 10: cp_standard · 0.27mm/px · 1 of 170 frames shown (6 of 15)]
[frame 145/170]
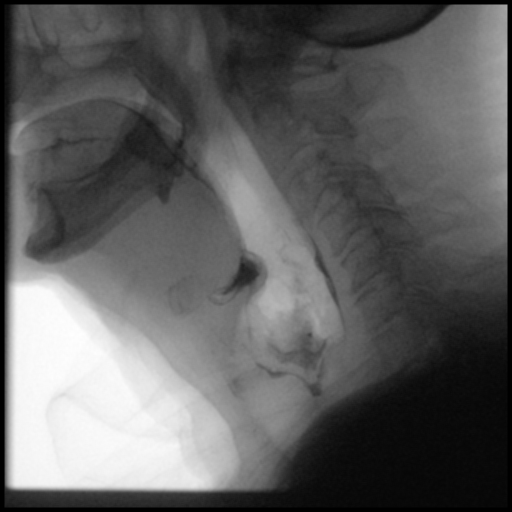

[Series 11: cp_standard · 0.27mm/px · 1 of 101 frames shown (7 of 15)]
[frame 86/101]
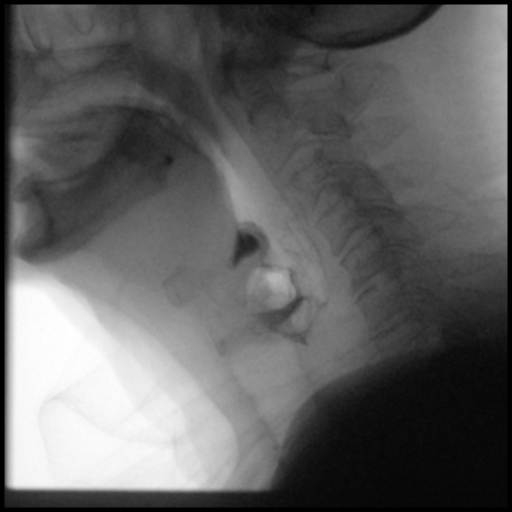

[Series 12: cp_standard · 0.27mm/px · 1 of 228 frames shown (8 of 15)]
[frame 93/228]
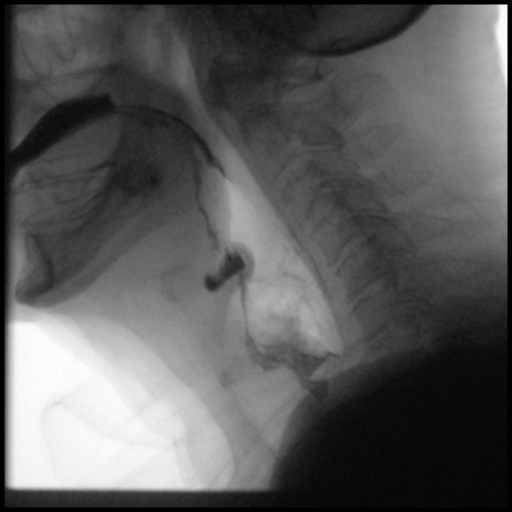

[Series 13: cp_standard · 0.27mm/px · 1 of 261 frames shown (9 of 15)]
[frame 189/261]
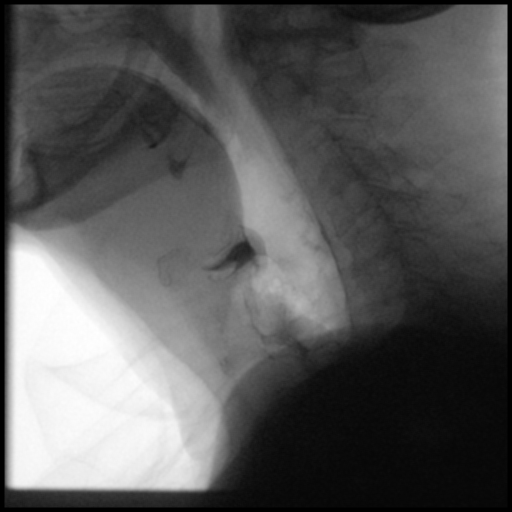

[Series 14: cp_standard · 0.27mm/px · 2 of 485 frames shown (10 of 15)]
[frame 73/485]
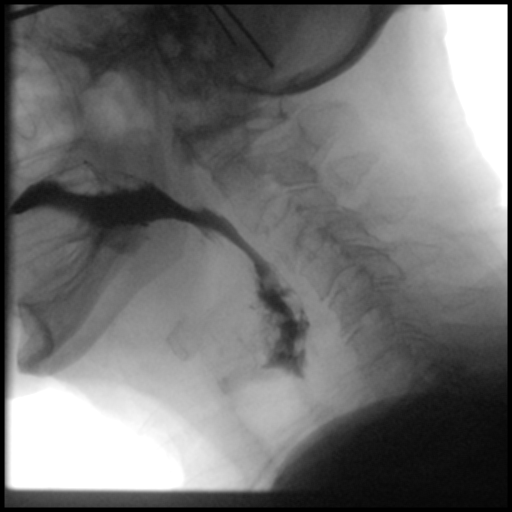
[frame 413/485]
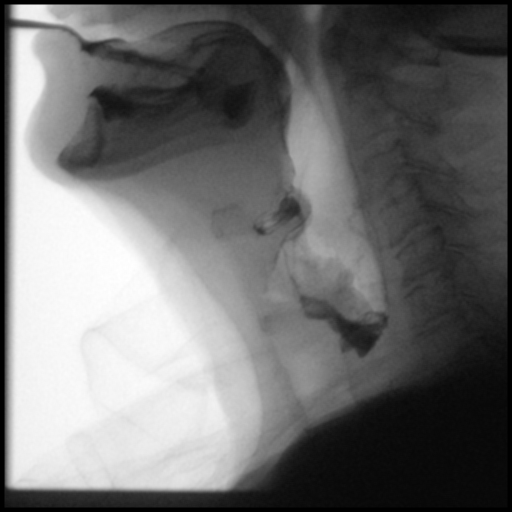

[Series 15: cp_standard · 0.27mm/px · 1 of 261 frames shown (11 of 15)]
[frame 133/261]
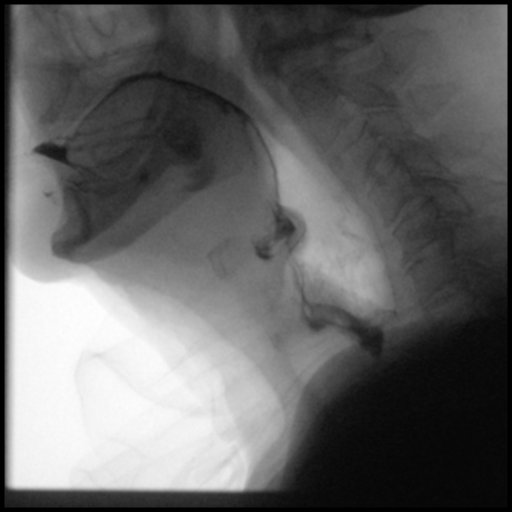

[Series 16: cp_standard · 0.27mm/px · 1 of 98 frames shown (12 of 15)]
[frame 84/98]
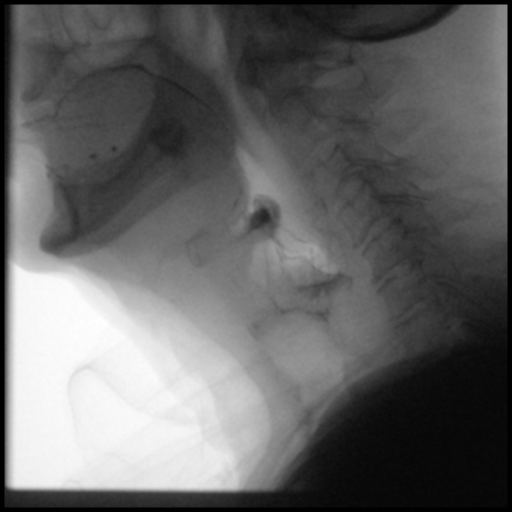

[Series 17: cp_standard · 0.27mm/px · 1 of 304 frames shown (13 of 15)]
[frame 153/304]
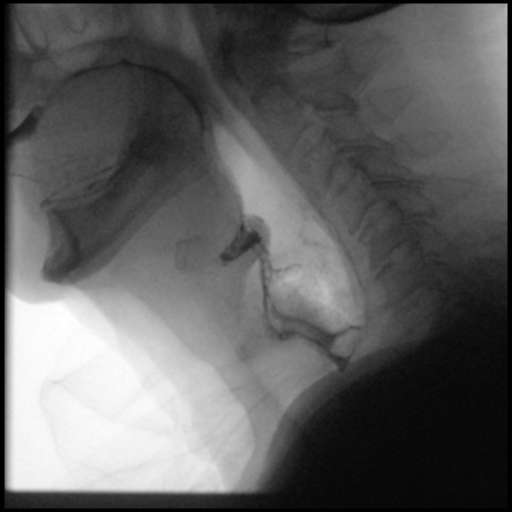

[Series 18: cp_standard · 0.27mm/px · 2 of 182 frames shown (14 of 15)]
[frame 28/182]
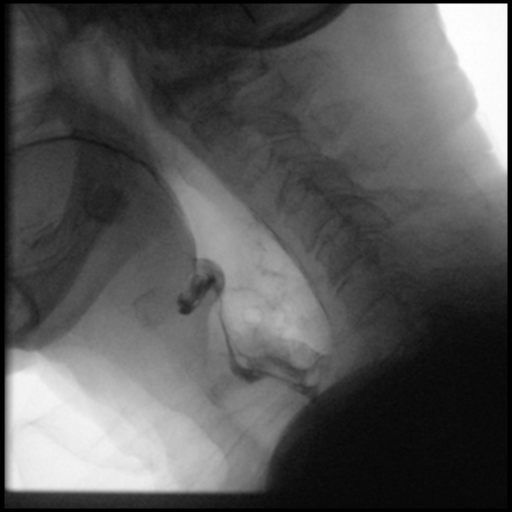
[frame 155/182]
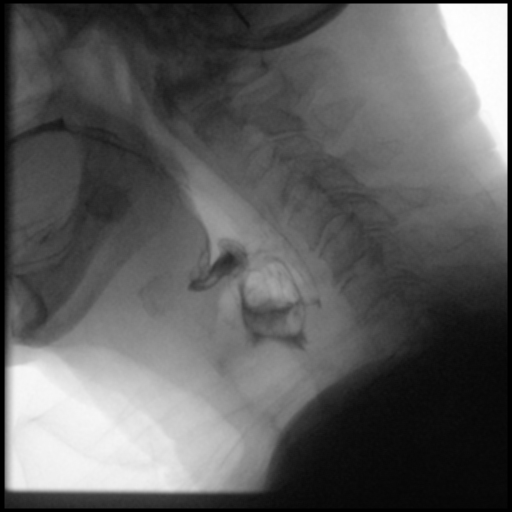

[Series 20: cp_standard · 0.27mm/px · 2 of 565 frames shown (15 of 15)]
[frame 85/565]
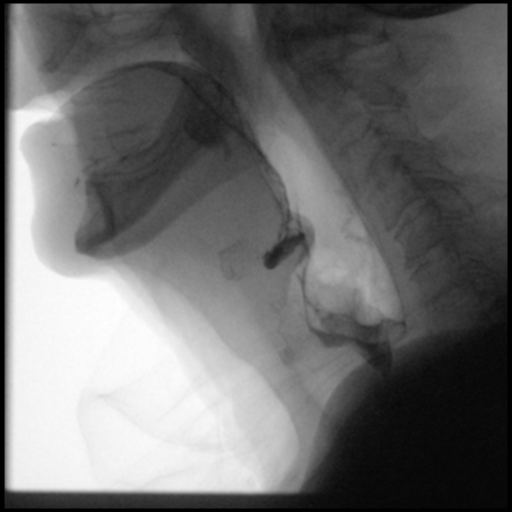
[frame 481/565]
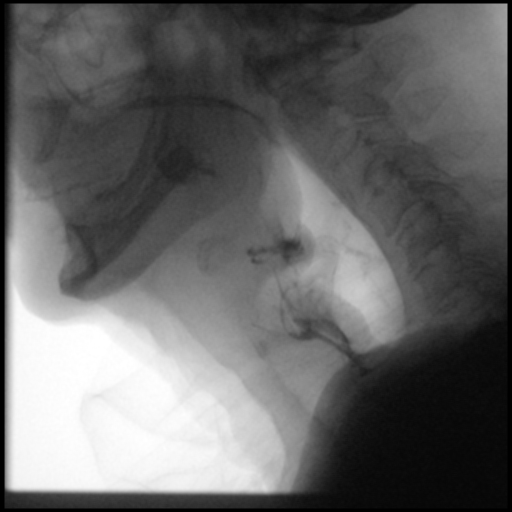

[19 of 24 positions shown; findings below may reference images not displayed]

FINDINGS: Thin liquid- initially, no laryngeal penetration or aspiration were
identified. Mild vallecular and piriform sinus residuals were seen.
Flash laryngeal penetration was seen on several swallows with
aspiration identified twice with thin barium, once following a cup
sip and a second time following sequential swallows with straw
presentation. BILATERAL piriform sinus residuals LEFT greater than
RIGHT and vallecular residuals were seen which did not decreased
significantly with swallows with head turned to LEFT or RIGHT.

Nectar thick liquid- no laryngeal penetration or aspiration.
Vallecular and piriform sinus residuals noted.

Honey- not evaluated

Pipit?Alfredo Enrique vallecular and piriform sinus residuals slightly with greater
on LEFT. No laryngeal penetration or aspiration.

Cracker-similar vallecular and piriform sinus residuals without
laryngeal penetration or aspiration

Pipit?Kozo with cracker- not evaluated

Barium tablet - swallowed within barium, with flash penetration of
the thin barium component to the level of cords but not below. No
definite aspiration. Vallecular and piriform sinus residuals noted.
IMPRESSION: Swallowing dysfunction as above.

Please refer to the Speech Pathologists report for complete details
and recommendations.

## 2017-11-30 ENCOUNTER — Ambulatory Visit (INDEPENDENT_AMBULATORY_CARE_PROVIDER_SITE_OTHER): Payer: Medicare Other | Admitting: Urology

## 2017-11-30 DIAGNOSIS — R338 Other retention of urine: Secondary | ICD-10-CM | POA: Diagnosis not present

## 2017-12-05 ENCOUNTER — Emergency Department (HOSPITAL_COMMUNITY): Payer: Medicare Other

## 2017-12-05 ENCOUNTER — Encounter (HOSPITAL_COMMUNITY): Payer: Self-pay | Admitting: Emergency Medicine

## 2017-12-05 ENCOUNTER — Other Ambulatory Visit: Payer: Self-pay

## 2017-12-05 ENCOUNTER — Inpatient Hospital Stay (HOSPITAL_COMMUNITY)
Admission: EM | Admit: 2017-12-05 | Discharge: 2017-12-10 | DRG: 389 | Disposition: A | Discharge status: Home Health | Payer: Medicare Other | Attending: Family Medicine | Admitting: Family Medicine

## 2017-12-05 DIAGNOSIS — K56600 Partial intestinal obstruction, unspecified as to cause: Principal | ICD-10-CM | POA: Diagnosis present

## 2017-12-05 DIAGNOSIS — Z91048 Other nonmedicinal substance allergy status: Secondary | ICD-10-CM

## 2017-12-05 DIAGNOSIS — Z96642 Presence of left artificial hip joint: Secondary | ICD-10-CM | POA: Diagnosis present

## 2017-12-05 DIAGNOSIS — N390 Urinary tract infection, site not specified: Secondary | ICD-10-CM | POA: Diagnosis present

## 2017-12-05 DIAGNOSIS — F05 Delirium due to known physiological condition: Secondary | ICD-10-CM | POA: Diagnosis present

## 2017-12-05 DIAGNOSIS — Z888 Allergy status to other drugs, medicaments and biological substances status: Secondary | ICD-10-CM

## 2017-12-05 DIAGNOSIS — R1111 Vomiting without nausea: Secondary | ICD-10-CM | POA: Diagnosis not present

## 2017-12-05 DIAGNOSIS — I69322 Dysarthria following cerebral infarction: Secondary | ICD-10-CM | POA: Diagnosis not present

## 2017-12-05 DIAGNOSIS — Z923 Personal history of irradiation: Secondary | ICD-10-CM

## 2017-12-05 DIAGNOSIS — E876 Hypokalemia: Secondary | ICD-10-CM | POA: Diagnosis not present

## 2017-12-05 DIAGNOSIS — K56609 Unspecified intestinal obstruction, unspecified as to partial versus complete obstruction: Secondary | ICD-10-CM | POA: Diagnosis not present

## 2017-12-05 DIAGNOSIS — I4891 Unspecified atrial fibrillation: Secondary | ICD-10-CM | POA: Diagnosis not present

## 2017-12-05 DIAGNOSIS — Z87891 Personal history of nicotine dependence: Secondary | ICD-10-CM | POA: Diagnosis not present

## 2017-12-05 DIAGNOSIS — B958 Unspecified staphylococcus as the cause of diseases classified elsewhere: Secondary | ICD-10-CM | POA: Diagnosis present

## 2017-12-05 DIAGNOSIS — I10 Essential (primary) hypertension: Secondary | ICD-10-CM | POA: Diagnosis present

## 2017-12-05 DIAGNOSIS — Y846 Urinary catheterization as the cause of abnormal reaction of the patient, or of later complication, without mention of misadventure at the time of the procedure: Secondary | ICD-10-CM | POA: Diagnosis present

## 2017-12-05 DIAGNOSIS — Z66 Do not resuscitate: Secondary | ICD-10-CM | POA: Diagnosis present

## 2017-12-05 DIAGNOSIS — T83511A Infection and inflammatory reaction due to indwelling urethral catheter, initial encounter: Secondary | ICD-10-CM | POA: Diagnosis present

## 2017-12-05 DIAGNOSIS — Z7901 Long term (current) use of anticoagulants: Secondary | ICD-10-CM

## 2017-12-05 DIAGNOSIS — Z8546 Personal history of malignant neoplasm of prostate: Secondary | ICD-10-CM | POA: Diagnosis not present

## 2017-12-05 DIAGNOSIS — E039 Hypothyroidism, unspecified: Secondary | ICD-10-CM | POA: Diagnosis not present

## 2017-12-05 DIAGNOSIS — K567 Ileus, unspecified: Secondary | ICD-10-CM | POA: Diagnosis not present

## 2017-12-05 DIAGNOSIS — I482 Chronic atrial fibrillation: Secondary | ICD-10-CM | POA: Diagnosis present

## 2017-12-05 DIAGNOSIS — R112 Nausea with vomiting, unspecified: Secondary | ICD-10-CM

## 2017-12-05 DIAGNOSIS — Z79899 Other long term (current) drug therapy: Secondary | ICD-10-CM | POA: Diagnosis not present

## 2017-12-05 DIAGNOSIS — I69320 Aphasia following cerebral infarction: Secondary | ICD-10-CM

## 2017-12-05 DIAGNOSIS — N2889 Other specified disorders of kidney and ureter: Secondary | ICD-10-CM | POA: Diagnosis not present

## 2017-12-05 DIAGNOSIS — I63412 Cerebral infarction due to embolism of left middle cerebral artery: Secondary | ICD-10-CM | POA: Diagnosis present

## 2017-12-05 DIAGNOSIS — T83511D Infection and inflammatory reaction due to indwelling urethral catheter, subsequent encounter: Secondary | ICD-10-CM | POA: Diagnosis not present

## 2017-12-05 DIAGNOSIS — R1110 Vomiting, unspecified: Secondary | ICD-10-CM | POA: Diagnosis not present

## 2017-12-05 LAB — COMPREHENSIVE METABOLIC PANEL
ALBUMIN: 3.8 g/dL (ref 3.5–5.0)
ALT: 17 U/L (ref 0–44)
ANION GAP: 9 (ref 5–15)
AST: 19 U/L (ref 15–41)
Alkaline Phosphatase: 73 U/L (ref 38–126)
BILIRUBIN TOTAL: 1.3 mg/dL — AB (ref 0.3–1.2)
BUN: 23 mg/dL (ref 8–23)
CO2: 26 mmol/L (ref 22–32)
Calcium: 9.3 mg/dL (ref 8.9–10.3)
Chloride: 104 mmol/L (ref 98–111)
Creatinine, Ser: 0.91 mg/dL (ref 0.61–1.24)
GFR calc non Af Amer: >60 mL/min (ref >60)
GLUCOSE: 125 mg/dL — AB (ref 70–99)
POTASSIUM: 3.8 mmol/L (ref 3.5–5.1)
Sodium: 139 mmol/L (ref 135–145)
TOTAL PROTEIN: 7.1 g/dL (ref 6.5–8.1)

## 2017-12-05 LAB — CBC WITH DIFFERENTIAL/PLATELET
BASOS PCT: 0 %
Basophils Absolute: 0 10*3/uL (ref 0.0–0.1)
EOS ABS: 0 10*3/uL (ref 0.0–0.7)
Eosinophils Relative: 0 %
HCT: 39.5 % (ref 39.0–52.0)
Hemoglobin: 12.9 g/dL — ABNORMAL LOW (ref 13.0–17.0)
Lymphocytes Relative: 12 %
Lymphs Abs: 1 10*3/uL (ref 0.7–4.0)
MCH: 30.5 pg (ref 26.0–34.0)
MCHC: 32.7 g/dL (ref 30.0–36.0)
MCV: 93.4 fL (ref 78.0–100.0)
MONO ABS: 0.9 10*3/uL (ref 0.1–1.0)
MONOS PCT: 10 %
NEUTROS ABS: 6.7 10*3/uL (ref 1.7–7.7)
Neutrophils Relative %: 78 %
Platelets: 189 10*3/uL (ref 150–400)
RBC: 4.23 MIL/uL (ref 4.22–5.81)
RDW: 14.1 % (ref 11.5–15.5)
WBC: 8.6 10*3/uL (ref 4.0–10.5)

## 2017-12-05 LAB — URINALYSIS, ROUTINE W REFLEX MICROSCOPIC
BILIRUBIN URINE: NEGATIVE
GLUCOSE, UA: NEGATIVE mg/dL
Ketones, ur: NEGATIVE mg/dL
NITRITE: POSITIVE — AB
PH: 6 (ref 5.0–8.0)
Protein, ur: 100 mg/dL — AB

## 2017-12-05 LAB — TROPONIN I: Troponin I: <0.03 ng/mL (ref <0.03)

## 2017-12-05 LAB — URINALYSIS, MICROSCOPIC (REFLEX): SQUAMOUS EPITHELIAL / LPF: NONE SEEN (ref 0–5)

## 2017-12-05 LAB — LIPASE, BLOOD: Lipase: 32 U/L (ref 11–51)

## 2017-12-05 MED ORDER — ACETAMINOPHEN 325 MG PO TABS: 650.0000 mg | ORAL_TABLET | Freq: Four times a day (QID) | ORAL | Status: DC | PRN

## 2017-12-05 MED ORDER — CHLORHEXIDINE GLUCONATE 0.12 % MT SOLN
15.0000 mL | Freq: Two times a day (BID) | OROMUCOSAL | Status: DC
  Administered 2017-12-05 – 2017-12-10 (×7): 15 mL via OROMUCOSAL
  Filled 2017-12-05 (×7): qty 15

## 2017-12-05 MED ORDER — APIXABAN 2.5 MG PO TABS
2.5000 mg | ORAL_TABLET | Freq: Two times a day (BID) | ORAL | Status: DC
  Filled 2017-12-05 (×3): qty 1

## 2017-12-05 MED ORDER — SODIUM CHLORIDE 0.9 % IV SOLN
1.0000 g | INTRAVENOUS | Status: DC
  Administered 2017-12-05 – 2017-12-06 (×2): 1 g via INTRAVENOUS
  Filled 2017-12-05 (×2): qty 10
  Filled 2017-12-05: qty 1

## 2017-12-05 MED ORDER — SODIUM CHLORIDE 0.9 % IV BOLUS
500.0000 mL | Freq: Once | INTRAVENOUS | Status: AC
  Administered 2017-12-05: 500 mL via INTRAVENOUS

## 2017-12-05 MED ORDER — MORPHINE SULFATE (PF) 2 MG/ML IV SOLN
2.0000 mg | Freq: Once | INTRAVENOUS | Status: AC
  Administered 2017-12-05: 2 mg via INTRAVENOUS
  Filled 2017-12-05: qty 1

## 2017-12-05 MED ORDER — ONDANSETRON HCL 4 MG/2ML IJ SOLN
4.0000 mg | Freq: Four times a day (QID) | INTRAMUSCULAR | Status: DC | PRN
  Administered 2017-12-05: 4 mg via INTRAVENOUS
  Filled 2017-12-05 (×2): qty 2

## 2017-12-05 MED ORDER — PHENOL 1.4 % MT LIQD
1.0000 | OROMUCOSAL | Status: DC | PRN
Start: 2017-12-05 — End: 2017-12-10
  Administered 2017-12-05: 1 via OROMUCOSAL
  Filled 2017-12-05: qty 177

## 2017-12-05 MED ORDER — IOHEXOL 300 MG/ML  SOLN
75.0000 mL | Freq: Once | INTRAMUSCULAR | Status: AC | PRN
  Administered 2017-12-05: 75 mL via INTRAVENOUS

## 2017-12-05 MED ORDER — IOPAMIDOL (ISOVUE-300) INJECTION 61%: 75.0000 mL | Freq: Once | INTRAVENOUS | Status: DC | PRN

## 2017-12-05 MED ORDER — ENOXAPARIN SODIUM 80 MG/0.8ML ~~LOC~~ SOLN
1.0000 mg/kg | Freq: Two times a day (BID) | SUBCUTANEOUS | Status: DC
  Administered 2017-12-05 – 2017-12-09 (×8): 70 mg via SUBCUTANEOUS
  Filled 2017-12-05 (×9): qty 0.8

## 2017-12-05 MED ORDER — POTASSIUM CHLORIDE IN NACL 20-0.9 MEQ/L-% IV SOLN
INTRAVENOUS | Status: DC
  Administered 2017-12-05 – 2017-12-09 (×6): via INTRAVENOUS

## 2017-12-05 MED ORDER — ORAL CARE MOUTH RINSE
15.0000 mL | Freq: Two times a day (BID) | OROMUCOSAL | Status: DC
  Administered 2017-12-06 – 2017-12-07 (×4): 15 mL via OROMUCOSAL

## 2017-12-05 MED ORDER — ONDANSETRON HCL 4 MG PO TABS: 4.0000 mg | ORAL_TABLET | Freq: Four times a day (QID) | ORAL | Status: DC | PRN

## 2017-12-05 MED ORDER — LEVOTHYROXINE SODIUM 100 MCG IV SOLR
12.5000 ug | Freq: Every day | INTRAVENOUS | Status: DC
  Administered 2017-12-05 – 2017-12-09 (×5): 12.5 ug via INTRAVENOUS
  Filled 2017-12-05 (×7): qty 5

## 2017-12-05 MED ORDER — METOPROLOL TARTRATE 5 MG/5ML IV SOLN
2.5000 mg | Freq: Four times a day (QID) | INTRAVENOUS | Status: DC
  Administered 2017-12-05 – 2017-12-09 (×13): 2.5 mg via INTRAVENOUS
  Filled 2017-12-05 (×13): qty 5

## 2017-12-05 MED ORDER — FAMOTIDINE IN NACL 20-0.9 MG/50ML-% IV SOLN
20.0000 mg | Freq: Two times a day (BID) | INTRAVENOUS | Status: DC
  Administered 2017-12-05 – 2017-12-10 (×10): 20 mg via INTRAVENOUS
  Filled 2017-12-05 (×10): qty 50

## 2017-12-05 MED ORDER — ACETAMINOPHEN 650 MG RE SUPP: 650.0000 mg | Freq: Four times a day (QID) | RECTAL | Status: DC | PRN

## 2017-12-05 MED ORDER — ONDANSETRON HCL 4 MG/2ML IJ SOLN
4.0000 mg | Freq: Once | INTRAMUSCULAR | Status: AC
  Administered 2017-12-05: 4 mg via INTRAVENOUS
  Filled 2017-12-05: qty 2

## 2017-12-05 NOTE — ED Notes (Signed)
Awaiting CT

## 2017-12-05 NOTE — ED Notes (Signed)
To Rad 

## 2017-12-05 NOTE — ED Notes (Signed)
In CT

## 2017-12-05 NOTE — ED Notes (Signed)
Ethan Faulkner reports pt may take home Eliquis

## 2017-12-05 NOTE — ED Notes (Signed)
From Rad 

## 2017-12-05 NOTE — ED Notes (Signed)
Spoke w family re: NG tube, pt discomfort  Family at bedside  Active listening

## 2017-12-05 NOTE — Progress Notes (Signed)
Pt arrived to room 338.

## 2017-12-05 NOTE — ED Notes (Signed)
Per daughter, pt has caregivers daily until 1800  His children then alternate care after 6 with charting on refrigerator door to  Help with continuity of care of pt of meals, BM, meds, concerns

## 2017-12-05 NOTE — ED Notes (Signed)
Pt given sips of water  Daughters will offer sip every 15 minutes

## 2017-12-05 NOTE — ED Provider Notes (Signed)
[facility] EMERGENCY DEPARTMENT Provider Note   CSN: [id number] Arrival date & time: 12/05/17  1856     History   Chief Complaint Chief Complaint  Patient presents with  . Nausea    HPI DERMOT Faulkner is a 84 y.o. male with a history of atrial fibrillation, CVA x2 with residual dysarthria, hypertension and thyroid disease, also history of prostate cancer presenting with a 2-day history of vomiting.  His daughter at the bedside who is 1 of his primary caregivers endorse he has had 6 episodes of vomiting in the past 24 hours and has been not kept any p.o. intake down since Friday.  He has had no fevers or chills no diarrhea, he denies abdominal pain or any other discomfort.  He has had no hematemesis.  He also denies chest pain, daughter endorses a mild nonproductive cough.  He has an indwelling Foley catheter with the tubing replaced monthly, most recently 1 week ago.  Surgical history is significant for cholecystectomy and left hip arthroplasty.  He has had no medications for his symptoms prior to arrival and has had none of his routine morning medications prior to arrival..  HPI  Past Medical History:  Diagnosis Date  . Afib ([place])   . Atrial fibrillation ([place])   . Cerebral infarction ([place])   . DDD (degenerative disc disease), lumbar   . Dysphagia   . Hypertension   . Hypothyroidism   . Migraine    "maybe monthly" (11/21/2015)  . Prostate cancer ([place]) ~ 1995   S/P radiation tx  . Renal mass    "slow growing something; doctors just watching it right now" (11/21/2015)  . Stroke [facility])    TIA  . Thrombocytopenia ([place])   . Thyroid disease    hypothyroid    Patient Active Problem List   Diagnosis Date Noted  . SBO (small bowel obstruction) ([place]) 12/05/2017  . Other hyperlipidemia 11/09/2017  . Gross hematuria 04/06/2017  . History of stroke 04/06/2017  . Hematuria, unspecified 02/19/2017  . Cerebrovascular accident (CVA) due to embolism of left middle cerebral artery  ([place]) 02/14/2017  . 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 TIA (transient ischemic attack) 05/21/2016  . Leukopenia   . Thrombocytopenia ([place])   . Fall   . Acute blood loss anemia   . Femoral neck fracture ([place]) 11/20/2015  . Renal malignant tumor ([place]) 06/06/2015  . Long-term (current) use of anticoagulants 04/29/2015  . Subclinical hypothyroidism 03/01/2014  . Atrial fibrillation ([place]) 06/09/2013  . STRESS FRACTURE, FOOT 12/12/2008  . CLOSED FRACTURE OF METATARSAL BONE 12/12/2008    Past Surgical History:  Procedure Laterality Date  . COLONOSCOPY  1999  . ESOPHAGOGASTRODUODENOSCOPY    . LAPAROSCOPIC CHOLECYSTECTOMY  1980s  . PROSTATE BIOPSY  ~ 1995  . TOTAL HIP ARTHROPLASTY Left 11/22/2015   Procedure: LEFT HIP HEMIARTHROPLASTY ;  Surgeon: Leandrew Koyanagi, MD;  Location: [place];  Service: Orthopedics;  Laterality: Left;        Home Medications    Prior to Admission medications   Medication Sig Start Date End Date Taking? Authorizing Provider  acetaminophen (TYLENOL) 325 MG tablet Take 325-650 mg by mouth every 6 (six) hours as needed for mild pain or moderate pain.   Yes [provider]  amLODipine (NORVASC) 5 MG tablet Take 1 tablet (5 mg total) by mouth daily. 06/10/17  Yes Mikey Kirschner, MD  atorvastatin (LIPITOR) 20 MG tablet TAKE 1 TABLET BY MOUTH ONCE DAILY AT 6:00  PM. 11/22/17  Yes Kathyrn Drown, MD  cetirizine (ZYRTEC) 10 MG tablet Take 10 mg by mouth every evening.    Yes [provider]  ELIQUIS 2.5 MG TABS tablet TAKE (1) TABLET BY MOUTH TWICE DAILY AT 9AM AND 9PM. 06/29/17  Yes Mikey Kirschner, MD  ketoconazole (NIZORAL) 2 % cream Apply 1 application 2 (two) times daily topically. 04/12/17  Yes Luking, Elayne Snare, MD  levothyroxine (SYNTHROID, LEVOTHROID) 25 MCG tablet TAKE 1 TABLET EVERY MORNING ON AN EMPTY STOMACH FOR THYROID. 11/15/17  Yes Wolfgang Phoenix, Elayne Snare, MD  metoprolol succinate (TOPROL-XL) 25 MG 24 hr tablet Take 1  tablet (25 mg total) by mouth every evening. 06/10/17  Yes Mikey Kirschner, MD  polyethylene glycol powder (GLYCOLAX/MIRALAX) powder MIX 1 CAPFUL IN 8 OZ OF WATER AND DRINK twice weekly Patient taking differently: MIX 1 CAPFUL IN 8 OZ OF WATER AND DRINK twice weekly as needed for constipation. 02/19/17  Yes Patteson, Arlan Organ, NP    Family History Family History  Problem Relation Age of Onset  . Stroke Sister        in her 41s  . CAD Neg Hx   . COPD Neg Hx   . Diabetes Neg Hx   . Heart disease Neg Hx     Social History Social History   Tobacco Use  . Smoking status: Former Research scientist (life sciences)  . Smokeless tobacco: Never Used  . Tobacco comment: 11/21/2015 "might have smoked 2 packs of cigarettes in my lifetime"  Substance Use Topics  . Alcohol use: Yes    Comment: 11/21/2015 "last beer I've had was in ~ 2007"  . Drug use: No     Allergies   Xanax [alprazolam] and Tape   Review of Systems Review of Systems  Constitutional: Positive for appetite change. Negative for chills and fever.  HENT: Negative for congestion and sore throat.   Eyes: Negative.   Respiratory: Negative for chest tightness and shortness of breath.   Cardiovascular: Negative for chest pain.  Gastrointestinal: Positive for nausea and vomiting. Negative for abdominal distention and abdominal pain.  Genitourinary: Negative.   Musculoskeletal: Negative for arthralgias, joint swelling and neck pain.  Skin: Negative.  Negative for rash and wound.  Neurological: Negative for dizziness, weakness, light-headedness, numbness and headaches.  Psychiatric/Behavioral: Negative.      Physical Exam Updated Vital Signs BP 122/73   Pulse 80   Temp (!) 97.3 F (36.3 C) (Oral)   Resp 16   Ht 6' (1.829 m)   Wt 68 kg (150 lb)   SpO2 95%   BMI 20.34 kg/m   Physical Exam  Constitutional: He appears well-developed and well-nourished.  HENT:  Head: Normocephalic and atraumatic.  Eyes: Conjunctivae are normal.  Neck: Normal  range of motion.  Cardiovascular: Normal rate, regular rhythm, normal heart sounds and intact distal pulses.  Pulmonary/Chest: Effort normal and breath sounds normal. He has no wheezes.  Abdominal: Soft. Bowel sounds are normal. He exhibits no distension and no mass. There is no tenderness. There is no guarding.  abd not significantly distended, however has increased tympany throughout all quadrants. No guarding.  Musculoskeletal: Normal range of motion.  Neurological: He is alert.  Skin: Skin is warm and dry.  Psychiatric: He has a normal mood and affect.  Nursing note and vitals reviewed.    ED Treatments / Results  Labs (all labs ordered are listed, but only abnormal results are displayed) Labs Reviewed  CBC WITH DIFFERENTIAL/PLATELET - Abnormal; Notable  for the following components:      Result Value   Hemoglobin 12.9 (*)    All other components within normal limits  COMPREHENSIVE METABOLIC PANEL - Abnormal; Notable for the following components:   Glucose, Bld 125 (*)    Total Bilirubin 1.3 (*)    All other components within normal limits  URINALYSIS, ROUTINE W REFLEX MICROSCOPIC - Abnormal; Notable for the following components:   APPearance HAZY (*)    Specific Gravity, Urine >1.030 (*)    Hgb urine dipstick MODERATE (*)    Protein, ur 100 (*)    Nitrite POSITIVE (*)    Leukocytes, UA MODERATE (*)    All other components within normal limits  URINALYSIS, MICROSCOPIC (REFLEX) - Abnormal; Notable for the following components:   Bacteria, UA FEW (*)    All other components within normal limits  URINE CULTURE  LIPASE, BLOOD  TROPONIN I    EKG EKG Interpretation  Date/Time:  Sunday December 05 2017 11:22:56 EDT Ventricular Rate:  84 PR Interval:    QRS Duration: 132 QT Interval:  414 QTC Calculation: 490 R Axis:   -66 Text Interpretation:  Atrial fibrillation Left bundle branch block Confirmed by Nat Christen [id number]) on 12/05/2017 11:45:41 AM   Radiology Ct Abdomen  Pelvis W Contrast  Result Date: 12/05/2017 CLINICAL DATA:  Vomiting. EXAM: CT ABDOMEN AND PELVIS WITH CONTRAST TECHNIQUE: Multidetector CT imaging of the abdomen and pelvis was performed using the standard protocol following bolus administration of intravenous contrast. CONTRAST:  42mL OMNIPAQUE IOHEXOL 300 MG/ML  SOLN COMPARISON:  Abdominal x-rays from same day. CT abdomen pelvis dated February 07, 2017. FINDINGS: Lower chest: Bibasilar atelectasis.  Cardiomegaly. Hepatobiliary: Unchanged 1.3 cm simple cyst in the caudate lobe. Status post cholecystectomy. No biliary dilatation. Pancreas: Unremarkable. No pancreatic ductal dilatation or surrounding inflammatory changes. Spleen: Normal in size without focal abnormality. Adrenals/Urinary Tract: The adrenal glands are unremarkable. Stable rim calcified bilateral complex renal cysts. Unchanged 5.6 x 4.8 cm heterogeneously enhancing lesion arising from the anterior left kidney. Unchanged hyperdense posterior interpolar mass measuring 2.4 x 1.4 cm. Slight interval increase in size of the simple cyst arising from the posterior lower pole of the left kidney. No renal or ureteral calculi. No hydronephrosis. Bladder is decompressed by Foley catheter. Stomach/Bowel: There are few mildly dilated loops of predominantly air-filled mid small bowel in the mid abdomen with gradual transition to nondilated small bowel in the central lower abdomen. The colon is unremarkable. Mild gastric distention. No bowel wall thickening or surrounding inflammatory changes. Normal appendix. Vascular/Lymphatic: Aortic atherosclerosis. No enlarged abdominal or pelvic lymph nodes. Reproductive: Prostate is unremarkable. Other: No free fluid or pneumoperitoneum. Musculoskeletal: New age-indeterminate moderate L2 compression fracture. No retropulsion. Prior left hip arthroplasty. IMPRESSION: 1. Few mildly dilated loops of predominantly air-filled mid small bowel with gradual transition to  non-dilated small bowel in the central lower abdomen. Findings are favored to reflect ileus or partial small bowel obstruction. 2. Grossly unchanged complex left renal mass suspicious for renal cell carcinoma. 3. New age-indeterminate moderate L2 compression fracture. Correlate with point tenderness. 4.  Aortic atherosclerosis (ICD10-I70.0). Electronically Signed   By: Titus Dubin M.D.   On: 12/05/2017 16:24   Dg Abd Acute W/chest  Result Date: 12/05/2017 CLINICAL DATA:  Vomiting over the last 3 days. EXAM: DG ABDOMEN ACUTE W/ 1V CHEST COMPARISON:  02/16/2017. FINDINGS: There are dilated fluid and air-filled loops of small intestine suggesting small bowel obstruction. No free air. Moderate amount of fecal matter  in the colon. Vascular calcification is seen. There has been previous cholecystectomy. Calcifications associated with both kidneys as shown by CT. Partial compression fractures in the lumbar spine, possibly progressive since CT scan of September 2018. One-view chest shows cardiomegaly and aortic atherosclerosis. Patchy pulmonary scarring at the lung bases is a chronic finding. No sign of acute infiltrate, collapse or effusion. No free air. IMPRESSION: Findings suggesting small bowel obstruction.  No free air. Cardiomegaly and aortic atherosclerosis.  Pulmonary scarring. Electronically Signed   By: Nelson Chimes M.D.   On: 12/05/2017 13:47    Procedures Procedures (including critical care time)  Medications Ordered in ED Medications  apixaban (ELIQUIS) tablet 2.5 mg (2.5 mg Oral Refused 12/05/17 1528)  iopamidol (ISOVUE-300) 61 % injection 75 mL (has no administration in time range)  cefTRIAXone (ROCEPHIN) 1 g in sodium chloride 0.9 % 100 mL IVPB (has no administration in time range)  sodium chloride 0.9 % bolus 500 mL (0 mLs Intravenous Stopped 12/05/17 1249)  ondansetron (ZOFRAN) injection 4 mg (4 mg Intravenous Given 12/05/17 1051)  iohexol (OMNIPAQUE) 300 MG/ML solution 75 mL (75 mLs  Intravenous Contrast Given 12/05/17 1545)     Initial Impression / Assessment and Plan / ED Course  I have reviewed the triage vital signs and the nursing notes.  Pertinent labs & imaging results that were available during my care of the patient were reviewed by me and considered in my medical decision making (see chart for details).  Clinical Course as of Dec 05 1712  Sun Dec 05, 2017  1642 CT ABDOMEN PELVIS W CONTRAST [JI]    Clinical Course User Index [JI] Evalee Jefferson, PA-C    Pt with 3 day h/o nausea and vomiting, not tolerating any PO intake, denies abd pain with acute abd series suggesting sbo vs ileus, confirmed by CT imaging.  Discussed with Dr. Constance Haw who reviewed CT imaging and will see pt in am, recommended NG tube placement.  Hospitalist admission.  Discussed with Dr Nehemiah Settle who agrees with admission.   Final Clinical Impressions(s) / ED Diagnoses   Final diagnoses:  Small bowel obstruction (HCC)  Nausea and vomiting, intractability of vomiting not specified, unspecified vomiting type  Urinary tract infection associated with indwelling urethral catheter, initial encounter [facility])    ED Discharge Orders    None       Landis Martins 12/05/17 [place]    Nat Christen, MD 12/07/17 [phone]

## 2017-12-05 NOTE — ED Notes (Signed)
Call for report RN unable to take report at  This time

## 2017-12-05 NOTE — ED Triage Notes (Signed)
Ems states that family stated that he has been vomiting since Friday. Pt denies n/v or pain

## 2017-12-05 NOTE — ED Notes (Signed)
Pt tolerating sips of water

## 2017-12-05 NOTE — ED Notes (Signed)
Urine from catheter  To lab  Urine sent due to inadequate amount for culture

## 2017-12-05 NOTE — ED Notes (Signed)
Pt's family upset that the doctor stated that he would be numbed and this was not commucated with the nursing staff so the pt's NG tube was paced with no numbing medication.

## 2017-12-05 NOTE — H&P (Signed)
History and Physical  Ethan Faulkner:[id number] DOB: Oct 08, 1915 DOA: 12/05/2017  Referring physician: Evalee Jefferson, Hershal Coria, ED provider PCP: Kathyrn Drown, MD  Outpatient Specialists:   Patient Coming From: home  Chief Complaint: vomiting  HPI: Ethan Faulkner is a 84 y.o. male with a history of HTN, hypothyroidism, h/o stroke with aphasia, a fib, chronic anticoagulation. Patient seen for vomiting since Friday. Patient is intolerant of oral food or liquids. Emesis described as stomach contents. No fevers, chills, stomach pain. Symptoms are worsening. No palliating or provoking factors.  Did have some constipation last week, but now having regular stools.  Emergency Department Course: UA shows UTI. WBC elevated to 12. CT shows ileus or partial SBO.  Review of Systems:  Pt denies any fevers, chills, diarrhea, constipation, abdominal pain, shortness of breath, dyspnea on exertion, orthopnea, cough, wheezing, palpitations, headache, vision changes, lightheadedness, dizziness, melena, rectal bleeding.  Review of systems are otherwise negative  Past Medical History:  Diagnosis Date  . Afib ([place])   . Atrial fibrillation ([place])   . Cerebral infarction ([place])   . DDD (degenerative disc disease), lumbar   . Dysphagia   . Hypertension   . Hypothyroidism   . Migraine    "maybe monthly" (11/21/2015)  . Prostate cancer ([place]) ~ 1995   S/P radiation tx  . Renal mass    "slow growing something; doctors just watching it right now" (11/21/2015)  . Stroke [facility])    TIA  . Thrombocytopenia ([place])   . Thyroid disease    hypothyroid   Past Surgical History:  Procedure Laterality Date  . COLONOSCOPY  1999  . ESOPHAGOGASTRODUODENOSCOPY    . LAPAROSCOPIC CHOLECYSTECTOMY  1980s  . PROSTATE BIOPSY  ~ 1995  . TOTAL HIP ARTHROPLASTY Left 11/22/2015   Procedure: LEFT HIP HEMIARTHROPLASTY ;  Surgeon: Leandrew Koyanagi, MD;  Location: [place];  Service: Orthopedics;  Laterality: Left;   Social History:   reports that he has quit smoking. He has never used smokeless tobacco. He reports that he drinks alcohol. He reports that he does not use drugs. Patient lives at home  Allergies  Allergen Reactions  . Xanax [Alprazolam] Other (See Comments)    Dizzy, Sedated  . Tape Rash    PATIENT'S SKIN WILL TEAR/PLEASE EITHER USE PAPER TAPE OR COBAN WRAP!!    Family History  Problem Relation Age of Onset  . Stroke Sister        in her 55s  . CAD Neg Hx   . COPD Neg Hx   . Diabetes Neg Hx   . Heart disease Neg Hx       Prior to Admission medications   Medication Sig Start Date End Date Taking? Authorizing Provider  acetaminophen (TYLENOL) 325 MG tablet Take 325-650 mg by mouth every 6 (six) hours as needed for mild pain or moderate pain.   Yes [provider]  amLODipine (NORVASC) 5 MG tablet Take 1 tablet (5 mg total) by mouth daily. 06/10/17  Yes Mikey Kirschner, MD  atorvastatin (LIPITOR) 20 MG tablet TAKE 1 TABLET BY MOUTH ONCE DAILY AT 6:00 PM. 11/22/17  Yes Luking, Elayne Snare, MD  cetirizine (ZYRTEC) 10 MG tablet Take 10 mg by mouth every evening.    Yes [provider]  ELIQUIS 2.5 MG TABS tablet TAKE (1) TABLET BY MOUTH TWICE DAILY AT 9AM AND 9PM. 06/29/17  Yes Mikey Kirschner, MD  ketoconazole (NIZORAL) 2 % cream Apply 1 application 2 (two) times daily topically.  04/12/17  Yes Luking, Elayne Snare, MD  levothyroxine (SYNTHROID, LEVOTHROID) 25 MCG tablet TAKE 1 TABLET EVERY MORNING ON AN EMPTY STOMACH FOR THYROID. 11/15/17  Yes Wolfgang Phoenix, Elayne Snare, MD  metoprolol succinate (TOPROL-XL) 25 MG 24 hr tablet Take 1 tablet (25 mg total) by mouth every evening. 06/10/17  Yes Mikey Kirschner, MD  polyethylene glycol powder (GLYCOLAX/MIRALAX) powder MIX 1 CAPFUL IN 8 OZ OF WATER AND DRINK twice weekly Patient taking differently: MIX 1 CAPFUL IN 8 OZ OF WATER AND DRINK twice weekly as needed for constipation. 02/19/17  Yes Charm Rings, NP    Physical Exam: BP 122/73   Pulse 80   Temp  (!) 97.3 F (36.3 C) (Oral)   Resp 16   Ht 6' (1.829 m)   Wt 68 kg (150 lb)   SpO2 95%   BMI 20.34 kg/m   . General: Elderly male. Awake and alert and oriented x3. No acute cardiopulmonary distress.  Marland Kitchen HEENT: Normocephalic atraumatic.  Right and left ears normal in appearance.  Pupils equal, round, reactive to light. Extraocular muscles are intact. Sclerae anicteric and noninjected.  Moist mucosal membranes. No mucosal lesions.  . Neck: Neck supple without lymphadenopathy. No carotid bruits. No masses palpated.  . Cardiovascular: Regular rate with normal S1-S2 sounds. No murmurs, rubs, gallops auscultated. No JVD.  Marland Kitchen Respiratory: Good respiratory effort with no wheezes, rales, rhonchi. Lungs clear to auscultation bilaterally.  No accessory muscle use. . Abdomen: Soft, nontender, nondistended. Active bowel sounds. No masses or hepatosplenomegaly  . Skin: No rashes, lesions, or ulcerations.  Dry, warm to touch. 2+ dorsalis pedis and radial pulses. . Musculoskeletal: No calf or leg pain. All major joints not erythematous nontender.  No upper or lower joint deformation.  Good ROM.  No contractures  . Psychiatric: Intact judgment and insight. Pleasant and cooperative. . Neurologic: No focal neurological deficits. Strength is 5/5 and symmetric in upper and lower extremities.  Cranial nerves II through XII are grossly intact.           Labs on Admission: I have personally reviewed following labs and imaging studies  CBC: Recent Labs  Lab 12/05/17 1003  WBC 8.6  NEUTROABS 6.7  HGB 12.9*  HCT 39.5  MCV 93.4  PLT 841   Basic Metabolic Panel: Recent Labs  Lab 12/05/17 1003  NA 139  K 3.8  CL 104  CO2 26  GLUCOSE 125*  BUN 23  CREATININE 0.91  CALCIUM 9.3   GFR: Estimated Creatinine Clearance: 42.6 mL/min (by C-G formula based on SCr of 0.91 mg/dL). Liver Function Tests: Recent Labs  Lab 12/05/17 1003  AST 19  ALT 17  ALKPHOS 73  BILITOT 1.3*  PROT 7.1  ALBUMIN 3.8    Recent Labs  Lab 12/05/17 1003  LIPASE 32   No results for input(s): AMMONIA in the last 168 hours. Coagulation Profile: No results for input(s): INR, PROTIME in the last 168 hours. Cardiac Enzymes: Recent Labs  Lab 12/05/17 1003  TROPONINI <0.03   BNP (last 3 results) No results for input(s): PROBNP in the last 8760 hours. HbA1C: No results for input(s): HGBA1C in the last 72 hours. CBG: No results for input(s): GLUCAP in the last 168 hours. Lipid Profile: No results for input(s): CHOL, HDL, LDLCALC, TRIG, CHOLHDL, LDLDIRECT in the last 72 hours. Thyroid Function Tests: No results for input(s): TSH, T4TOTAL, FREET4, T3FREE, THYROIDAB in the last 72 hours. Anemia Panel: No results for input(s): VITAMINB12, FOLATE, FERRITIN, TIBC, IRON,  RETICCTPCT in the last 72 hours. Urine analysis:    Component Value Date/Time   COLORURINE YELLOW 12/05/2017 1330   APPEARANCEUR HAZY (A) 12/05/2017 1330   LABSPEC >1.030 (H) 12/05/2017 1330   PHURINE 6.0 12/05/2017 1330   GLUCOSEU NEGATIVE 12/05/2017 1330   HGBUR MODERATE (A) 12/05/2017 1330   BILIRUBINUR NEGATIVE 12/05/2017 1330   BILIRUBINUR ++ 10/26/2016 1121   KETONESUR NEGATIVE 12/05/2017 1330   PROTEINUR 100 (A) 12/05/2017 1330   UROBILINOGEN 0.2 05/11/2016 1117   NITRITE POSITIVE (A) 12/05/2017 1330   LEUKOCYTESUR MODERATE (A) 12/05/2017 1330   Sepsis Labs: @LABRCNTIP (procalcitonin:4,lacticidven:4) )No results found for this or any previous visit (from the past 240 hour(s)).   Radiological Exams on Admission: Ct Abdomen Pelvis W Contrast  Result Date: 12/05/2017 CLINICAL DATA:  Vomiting. EXAM: CT ABDOMEN AND PELVIS WITH CONTRAST TECHNIQUE: Multidetector CT imaging of the abdomen and pelvis was performed using the standard protocol following bolus administration of intravenous contrast. CONTRAST:  76mL OMNIPAQUE IOHEXOL 300 MG/ML  SOLN COMPARISON:  Abdominal x-rays from same day. CT abdomen pelvis dated February 07, 2017.  FINDINGS: Lower chest: Bibasilar atelectasis.  Cardiomegaly. Hepatobiliary: Unchanged 1.3 cm simple cyst in the caudate lobe. Status post cholecystectomy. No biliary dilatation. Pancreas: Unremarkable. No pancreatic ductal dilatation or surrounding inflammatory changes. Spleen: Normal in size without focal abnormality. Adrenals/Urinary Tract: The adrenal glands are unremarkable. Stable rim calcified bilateral complex renal cysts. Unchanged 5.6 x 4.8 cm heterogeneously enhancing lesion arising from the anterior left kidney. Unchanged hyperdense posterior interpolar mass measuring 2.4 x 1.4 cm. Slight interval increase in size of the simple cyst arising from the posterior lower pole of the left kidney. No renal or ureteral calculi. No hydronephrosis. Bladder is decompressed by Foley catheter. Stomach/Bowel: There are few mildly dilated loops of predominantly air-filled mid small bowel in the mid abdomen with gradual transition to nondilated small bowel in the central lower abdomen. The colon is unremarkable. Mild gastric distention. No bowel wall thickening or surrounding inflammatory changes. Normal appendix. Vascular/Lymphatic: Aortic atherosclerosis. No enlarged abdominal or pelvic lymph nodes. Reproductive: Prostate is unremarkable. Other: No free fluid or pneumoperitoneum. Musculoskeletal: New age-indeterminate moderate L2 compression fracture. No retropulsion. Prior left hip arthroplasty. IMPRESSION: 1. Few mildly dilated loops of predominantly air-filled mid small bowel with gradual transition to non-dilated small bowel in the central lower abdomen. Findings are favored to reflect ileus or partial small bowel obstruction. 2. Grossly unchanged complex left renal mass suspicious for renal cell carcinoma. 3. New age-indeterminate moderate L2 compression fracture. Correlate with point tenderness. 4.  Aortic atherosclerosis (ICD10-I70.0). Electronically Signed   By: Titus Dubin M.D.   On: 12/05/2017 16:24   Dg  Abd Acute W/chest  Result Date: 12/05/2017 CLINICAL DATA:  Vomiting over the last 3 days. EXAM: DG ABDOMEN ACUTE W/ 1V CHEST COMPARISON:  02/16/2017. FINDINGS: There are dilated fluid and air-filled loops of small intestine suggesting small bowel obstruction. No free air. Moderate amount of fecal matter in the colon. Vascular calcification is seen. There has been previous cholecystectomy. Calcifications associated with both kidneys as shown by CT. Partial compression fractures in the lumbar spine, possibly progressive since CT scan of September 2018. One-view chest shows cardiomegaly and aortic atherosclerosis. Patchy pulmonary scarring at the lung bases is a chronic finding. No sign of acute infiltrate, collapse or effusion. No free air. IMPRESSION: Findings suggesting small bowel obstruction.  No free air. Cardiomegaly and aortic atherosclerosis.  Pulmonary scarring. Electronically Signed   By: Jan Fireman.D.  On: 12/05/2017 13:47    EKG: Independently reviewed. A fib, LBBB, no ST changes.  Assessment/Plan: Principal Problem:   SBO (small bowel obstruction) (HCC) Active Problems:   Atrial fibrillation (HCC)   Subclinical hypothyroidism   Long term current use of anticoagulant therapy   Cerebrovascular accident (CVA) due to embolism of left middle cerebral artery ([place])   Acute lower UTI    This patient was discussed with the ED physician, including pertinent vitals, physical exam findings, labs, and imaging.  We also discussed care given by the ED provider.  1. SBO a. Admit b. NG tube placed - low intermittent suction c. NPO d. IVF 2. UTI a. Ceftriaxone b. Urine culture c. CBC tomorrow 3. H/o stroke a.  4. Long term anticoagulation a. Change to subcut lovenox 5. A fib a. Metoprolol IV 6. Hypothyroidism. a. Synthroid IV  DVT prophylaxis: lovenox Consultants: Gen Surg Code Status: DNR Family Communication: daughters and son present  Disposition Plan: return home following  resolution of SBO   Truett Mainland, DO [facility] Pager [phone]  If 7PM-7AM, please contact night-coverage [email or web address] Password [email or web address]

## 2017-12-06 DIAGNOSIS — Z7901 Long term (current) use of anticoagulants: Secondary | ICD-10-CM

## 2017-12-06 DIAGNOSIS — I482 Chronic atrial fibrillation: Secondary | ICD-10-CM

## 2017-12-06 DIAGNOSIS — T83511A Infection and inflammatory reaction due to indwelling urethral catheter, initial encounter: Secondary | ICD-10-CM

## 2017-12-06 DIAGNOSIS — K567 Ileus, unspecified: Secondary | ICD-10-CM

## 2017-12-06 DIAGNOSIS — I63412 Cerebral infarction due to embolism of left middle cerebral artery: Secondary | ICD-10-CM

## 2017-12-06 DIAGNOSIS — N390 Urinary tract infection, site not specified: Secondary | ICD-10-CM

## 2017-12-06 DIAGNOSIS — K56609 Unspecified intestinal obstruction, unspecified as to partial versus complete obstruction: Secondary | ICD-10-CM

## 2017-12-06 DIAGNOSIS — E039 Hypothyroidism, unspecified: Secondary | ICD-10-CM

## 2017-12-06 LAB — CBC
HCT: 35.6 % — ABNORMAL LOW (ref 39.0–52.0)
Hemoglobin: 11.2 g/dL — ABNORMAL LOW (ref 13.0–17.0)
MCH: 30 pg (ref 26.0–34.0)
MCHC: 31.5 g/dL (ref 30.0–36.0)
MCV: 95.4 fL (ref 78.0–100.0)
Platelets: 151 10*3/uL (ref 150–400)
RBC: 3.73 MIL/uL — AB (ref 4.22–5.81)
RDW: 14.2 % (ref 11.5–15.5)
WBC: 5.3 10*3/uL (ref 4.0–10.5)

## 2017-12-06 LAB — BASIC METABOLIC PANEL
Anion gap: 8 (ref 5–15)
BUN: 21 mg/dL (ref 8–23)
CALCIUM: 8.7 mg/dL — AB (ref 8.9–10.3)
CO2: 28 mmol/L (ref 22–32)
CREATININE: 0.83 mg/dL (ref 0.61–1.24)
Chloride: 109 mmol/L (ref 98–111)
GFR calc Af Amer: >60 mL/min (ref >60)
GFR calc non Af Amer: >60 mL/min (ref >60)
GLUCOSE: 100 mg/dL — AB (ref 70–99)
Potassium: 4 mmol/L (ref 3.5–5.1)
Sodium: 145 mmol/L (ref 135–145)

## 2017-12-06 MED ORDER — MORPHINE SULFATE (PF) 2 MG/ML IV SOLN
1.0000 mg | Freq: Once | INTRAVENOUS | Status: AC
  Administered 2017-12-07: 1 mg via INTRAVENOUS
  Filled 2017-12-06: qty 1

## 2017-12-06 NOTE — Progress Notes (Signed)
PROGRESS NOTE    Ethan Faulkner  WGN:[id number]  DOB: 1915/08/08  DOA: 12/05/2017 PCP: Kathyrn Drown, MD   Brief Admission Hx: Ethan Faulkner is a 84 y.o. male with a history of HTN, hypothyroidism, h/o stroke with aphasia, a fib, chronic anticoagulation. Patient seen for vomiting since Friday. Patient is intolerant of oral food or liquids. Emesis described as stomach contents. No fevers, chills, stomach pain. Symptoms are worsening. No palliating or provoking factors.  Did have some constipation last week, but now having regular stools.  MDM/Assessment & Plan:   1. Small bowel obstruction - Pt is relieved after the NG was placed and there has been consistent output.   Keep NPO, Continue supportive care with IVFs.  Follow.  I spoke with Dr. Constance Haw with surgery who is following him as well.   2. UTI - continue ceftriaxone and follow culture.  3. Atrial Fibrillation - chronic - continue metoprolol and anticoagulation.  4. History of recent large CVA - Pt is chronically anticoagulated, for now he is on lovenox while NPO.  5. Hypothyroidism - synthroid IV ordered while NPO.    DVT prophylaxis: lovenox Code Status: DNR Family Communication: daughter  Disposition Plan: home with 24/7 care is daughter's plan at this time when medically stabilized   Consultants:  surgery  Subjective: Pt sleeping.  Appears comfortable.   Objective: Vitals:   12/05/17 1630 12/05/17 1851 12/05/17 2108 12/06/17 0506  BP: 122/73 (!) 173/76 132/80 134/64  Pulse: 80 90 92 73  Resp: 16 18    Temp:  98.1 F (36.7 C) 98 F (36.7 C) 98 F (36.7 C)  TempSrc:   Oral Oral  SpO2: 95% 97% 91% 94%  Weight:  69.5 kg (153 lb 3.5 oz)    Height:  6' (1.829 m)      Intake/Output Summary (Last 24 hours) at 12/06/2017 1130 Last data filed at 12/06/2017 0900 Gross per 24 hour  Intake 1471.67 ml  Output 325 ml  Net 1146.67 ml   Filed Weights   12/05/17 0947 12/05/17 1851  Weight: 68 kg (150 lb) 69.5 kg  (153 lb 3.5 oz)     REVIEW OF SYSTEMS  As per history otherwise all reviewed and reported negative  Exam:  General exam: pt sleeping, NAD.  Respiratory system: Clear. No increased work of breathing. Cardiovascular system: S1 & S2 heard, RRR. No JVD, murmurs, gallops, clicks or pedal edema. Gastrointestinal system: Abdomen is nondistended, soft. Normal bowel sounds heard. Central nervous system: No focal neurological deficits. Extremities: no CCE.  Data Reviewed: Basic Metabolic Panel: Recent Labs  Lab 12/05/17 1003 12/06/17 0502  NA 139 145  K 3.8 4.0  CL 104 109  CO2 26 28  GLUCOSE 125* 100*  BUN 23 21  CREATININE 0.91 0.83  CALCIUM 9.3 8.7*   Liver Function Tests: Recent Labs  Lab 12/05/17 1003  AST 19  ALT 17  ALKPHOS 73  BILITOT 1.3*  PROT 7.1  ALBUMIN 3.8   Recent Labs  Lab 12/05/17 1003  LIPASE 32   No results for input(s): AMMONIA in the last 168 hours. CBC: Recent Labs  Lab 12/05/17 1003 12/06/17 0502  WBC 8.6 5.3  NEUTROABS 6.7  --   HGB 12.9* 11.2*  HCT 39.5 35.6*  MCV 93.4 95.4  PLT 189 151   Cardiac Enzymes: Recent Labs  Lab 12/05/17 1003  TROPONINI <0.03   CBG (last 3)  No results for input(s): GLUCAP in the last 72 hours. No results  found for this or any previous visit (from the past 240 hour(s)).   Studies: Ct Abdomen Pelvis W Contrast  Result Date: 12/05/2017 CLINICAL DATA:  Vomiting. EXAM: CT ABDOMEN AND PELVIS WITH CONTRAST TECHNIQUE: Multidetector CT imaging of the abdomen and pelvis was performed using the standard protocol following bolus administration of intravenous contrast. CONTRAST:  7mL OMNIPAQUE IOHEXOL 300 MG/ML  SOLN COMPARISON:  Abdominal x-rays from same day. CT abdomen pelvis dated February 07, 2017. FINDINGS: Lower chest: Bibasilar atelectasis.  Cardiomegaly. Hepatobiliary: Unchanged 1.3 cm simple cyst in the caudate lobe. Status post cholecystectomy. No biliary dilatation. Pancreas: Unremarkable. No  pancreatic ductal dilatation or surrounding inflammatory changes. Spleen: Normal in size without focal abnormality. Adrenals/Urinary Tract: The adrenal glands are unremarkable. Stable rim calcified bilateral complex renal cysts. Unchanged 5.6 x 4.8 cm heterogeneously enhancing lesion arising from the anterior left kidney. Unchanged hyperdense posterior interpolar mass measuring 2.4 x 1.4 cm. Slight interval increase in size of the simple cyst arising from the posterior lower pole of the left kidney. No renal or ureteral calculi. No hydronephrosis. Bladder is decompressed by Foley catheter. Stomach/Bowel: There are few mildly dilated loops of predominantly air-filled mid small bowel in the mid abdomen with gradual transition to nondilated small bowel in the central lower abdomen. The colon is unremarkable. Mild gastric distention. No bowel wall thickening or surrounding inflammatory changes. Normal appendix. Vascular/Lymphatic: Aortic atherosclerosis. No enlarged abdominal or pelvic lymph nodes. Reproductive: Prostate is unremarkable. Other: No free fluid or pneumoperitoneum. Musculoskeletal: New age-indeterminate moderate L2 compression fracture. No retropulsion. Prior left hip arthroplasty. IMPRESSION: 1. Few mildly dilated loops of predominantly air-filled mid small bowel with gradual transition to non-dilated small bowel in the central lower abdomen. Findings are favored to reflect ileus or partial small bowel obstruction. 2. Grossly unchanged complex left renal mass suspicious for renal cell carcinoma. 3. New age-indeterminate moderate L2 compression fracture. Correlate with point tenderness. 4.  Aortic atherosclerosis (ICD10-I70.0). Electronically Signed   By: Titus Dubin M.D.   On: 12/05/2017 16:24   Dg Abd Acute W/chest  Result Date: 12/05/2017 CLINICAL DATA:  Vomiting over the last 3 days. EXAM: DG ABDOMEN ACUTE W/ 1V CHEST COMPARISON:  02/16/2017. FINDINGS: There are dilated fluid and air-filled  loops of small intestine suggesting small bowel obstruction. No free air. Moderate amount of fecal matter in the colon. Vascular calcification is seen. There has been previous cholecystectomy. Calcifications associated with both kidneys as shown by CT. Partial compression fractures in the lumbar spine, possibly progressive since CT scan of September 2018. One-view chest shows cardiomegaly and aortic atherosclerosis. Patchy pulmonary scarring at the lung bases is a chronic finding. No sign of acute infiltrate, collapse or effusion. No free air. IMPRESSION: Findings suggesting small bowel obstruction.  No free air. Cardiomegaly and aortic atherosclerosis.  Pulmonary scarring. Electronically Signed   By: Nelson Chimes M.D.   On: 12/05/2017 13:47     Scheduled Meds: . chlorhexidine  15 mL Mouth Rinse BID  . enoxaparin (LOVENOX) injection  1 mg/kg Subcutaneous Q12H  . levothyroxine  12.5 mcg Intravenous Daily  . mouth rinse  15 mL Mouth Rinse q12n4p  . metoprolol tartrate  2.5 mg Intravenous Q6H   Continuous Infusions: . 0.9 % NaCl with KCl 20 mEq / L 65 mL/hr at 12/06/17 0649  . cefTRIAXone (ROCEPHIN)  IV Stopped (12/05/17 1900)  . famotidine (PEPCID) IV Stopped (12/06/17 5631)    Principal Problem:   SBO (small bowel obstruction) (HCC) Active Problems:   Atrial fibrillation (  [place])   Subclinical hypothyroidism   Long term current use of anticoagulant therapy   Cerebrovascular accident (CVA) due to embolism of left middle cerebral artery ([place])   Acute lower UTI  Time spent:   Irwin Brakeman, MD, FAAFP [facility] Pager [phone]  If 7PM-7AM, please contact night-coverage [email or web address] Password [email or web address] 12/06/2017, 11:30 AM    LOS: 1 day

## 2017-12-06 NOTE — Care Management Note (Signed)
Case Management Note  Patient Details  Name: NAWAF STRANGE MRN: [id number] Date of Birth: Feb 28, 1916  Subjective/Objective:    Admitted with SBO. Pt from home. Pt has 24/7 care. Pt not active with HH pta but has used [facility] in the past. Pt able to ambulate with walker and gait belt pta. Pt's sitter and daughter in law at bedside and provided information. Pt's daughter [name] would be primary contact. Pt has 7 children total; 5 of them live locally.                 Action/Plan: CM will cont to follow, anticipate pt will return home unless functional status declines to point aids are unable to care for him.   Expected Discharge Date:        12/10/2017          Expected Discharge Plan:  Home/Self Care  In-House Referral:  NA  Discharge planning Services  CM Consult  Post Acute Care Choice:    Choice offered to:     DME Arranged:    DME Agency:     HH Arranged:    HH Agency:     Status of Service:  In process, will continue to follow  If discussed at Long Length of Stay Meetings, dates discussed:    Additional Comments:  Sherald Barge, RN 12/06/2017, 2:54 PM

## 2017-12-06 NOTE — Consult Note (Signed)
[facility] Consult  Reason for Consult: pSBO versus ileus  Referring Physician:  Dr. Nehemiah Settle, Evalee Jefferson PA (ED)   Chief Complaint    Nausea      Ethan Faulkner is a 84 y.o. male.  HPI: Mr. Adamcik is a 84 yo who presented to the hospital with a 2  Day history of nausea/vomiting. He had been having some BMs as recently as Saturday, but was seen in the ED and found to have a UTI and concern on CT for a pSBO versus ileus.  Per report he had a cholecystectomy. He is aphasic at baseline from a stroke, and his care giver is in the room with him today.    He has a history of A fib, on anticoagulation, HTN, Hypothyroidism, stroke with aphasia. He has had no prior bowel obstructions.   Past Medical History:  Diagnosis Date  . Afib ([place])   . Atrial fibrillation ([place])   . Cerebral infarction ([place])   . DDD (degenerative disc disease), lumbar   . Dysphagia   . Hypertension   . Hypothyroidism   . Migraine    "maybe monthly" (11/21/2015)  . Prostate cancer ([place]) ~ 1995   S/P radiation tx  . Renal mass    "slow growing something; doctors just watching it right now" (11/21/2015)  . Stroke [facility])    TIA  . Thrombocytopenia ([place])   . Thyroid disease    hypothyroid    Past Surgical History:  Procedure Laterality Date  . COLONOSCOPY  1999  . ESOPHAGOGASTRODUODENOSCOPY    . LAPAROSCOPIC CHOLECYSTECTOMY  1980s  . PROSTATE BIOPSY  ~ 1995  . TOTAL HIP ARTHROPLASTY Left 11/22/2015   Procedure: LEFT HIP HEMIARTHROPLASTY ;  Surgeon: Leandrew Koyanagi, MD;  Location: [facility];  Service: Orthopedics;  Laterality: Left;    Family History  Problem Relation Age of Onset  . Stroke Sister        in her 69s  . CAD Neg Hx   . COPD Neg Hx   . Diabetes Neg Hx   . Heart disease Neg Hx     Social History   Tobacco Use  . Smoking status: Former Research scientist (life sciences)  . Smokeless tobacco: Never Used  . Tobacco comment: 11/21/2015 "might have smoked 2 packs of cigarettes in my lifetime"  Substance Use  Topics  . Alcohol use: Yes    Comment: 11/21/2015 "last beer I've had was in ~ 2007"  . Drug use: No    Medications:  I have reviewed the patient's current medications. Prior to Admission:  Medications Prior to Admission  Medication Sig Dispense Refill Last Dose  . acetaminophen (TYLENOL) 325 MG tablet Take 325-650 mg by mouth every 6 (six) hours as needed for mild pain or moderate pain.   Past Week at Unknown time  . amLODipine (NORVASC) 5 MG tablet Take 1 tablet (5 mg total) by mouth daily. 30 tablet 5 12/04/2017 at Unknown time  . atorvastatin (LIPITOR) 20 MG tablet TAKE 1 TABLET BY MOUTH ONCE DAILY AT 6:00 PM. 30 tablet 5 12/04/2017 at Unknown time  . cetirizine (ZYRTEC) 10 MG tablet Take 10 mg by mouth every evening.    12/04/2017 at Unknown time  . ELIQUIS 2.5 MG TABS tablet TAKE (1) TABLET BY MOUTH TWICE DAILY AT 9AM AND 9PM. 60 tablet 5 12/04/2017 at 1730  . ketoconazole (NIZORAL) 2 % cream Apply 1 application 2 (two) times daily topically. 45 g 4 12/04/2017 at Unknown time  . levothyroxine (  SYNTHROID, LEVOTHROID) 25 MCG tablet TAKE 1 TABLET EVERY MORNING ON AN EMPTY STOMACH FOR THYROID. 30 tablet 5 12/04/2017 at Unknown time  . metoprolol succinate (TOPROL-XL) 25 MG 24 hr tablet Take 1 tablet (25 mg total) by mouth every evening. 30 tablet 5 12/04/2017 at 1730  . polyethylene glycol powder (GLYCOLAX/MIRALAX) powder MIX 1 CAPFUL IN 8 OZ OF WATER AND DRINK twice weekly (Patient taking differently: MIX 1 CAPFUL IN 8 OZ OF WATER AND DRINK twice weekly as needed for constipation.) 527 g 0 Past Week at Unknown time   Scheduled: . chlorhexidine  15 mL Mouth Rinse BID  . enoxaparin (LOVENOX) injection  1 mg/kg Subcutaneous Q12H  . levothyroxine  12.5 mcg Intravenous Daily  . mouth rinse  15 mL Mouth Rinse q12n4p  . metoprolol tartrate  2.5 mg Intravenous Q6H   Continuous: . 0.9 % NaCl with KCl 20 mEq / L 65 mL/hr at 12/06/17 0649  . cefTRIAXone (ROCEPHIN)  IV Stopped (12/05/17 1900)  .  famotidine (PEPCID) IV Stopped (12/06/17 3244)   WNU:[id number] **OR** acetaminophen, ondansetron **OR** ondansetron (ZOFRAN) IV, phenol  Allergies  Allergen Reactions  . Xanax [Alprazolam] Other (See Comments)    Dizzy, Sedated  . Tape Rash    PATIENT'S SKIN WILL TEAR/PLEASE EITHER USE PAPER TAPE OR COBAN WRAP!!    ROS:  A comprehensive review of systems was negative except for: Gastrointestinal: positive for nausea and vomiting  Blood pressure 134/64, pulse 73, temperature 98 F (36.7 C), temperature source Oral, resp. rate 18, height 6' (1.829 m), weight 153 lb 3.5 oz (69.5 kg), SpO2 94 %. Physical Exam  Constitutional: He appears well-developed.  HENT:  Head: Normocephalic.  Eyes: Pupils are equal, round, and reactive to light.  Neck: Normal range of motion.  Cardiovascular: Normal rate.  Pulmonary/Chest: Effort normal.  Abdominal: Soft. He exhibits no distension. There is no tenderness.  Difficult to see any scars, ? Unsure if open or laparoscopic cholecystectomy  Musculoskeletal: Normal range of motion. He exhibits no edema.  Neurological: He is alert.  Skin: Skin is warm and dry.  Psychiatric: He has a normal mood and affect. His behavior is normal.  Vitals reviewed.   Results: Results for orders placed or performed during the hospital encounter of 12/05/17 (from the past 48 hour(s))  CBC with Differential     Status: Abnormal   Collection Time: 12/05/17 10:03 AM  Result Value Ref Range   WBC 8.6 4.0 - 10.5 K/uL   RBC 4.23 4.22 - 5.81 MIL/uL   Hemoglobin 12.9 (L) 13.0 - 17.0 g/dL   HCT 39.5 39.0 - 52.0 %   MCV 93.4 78.0 - 100.0 fL   MCH 30.5 26.0 - 34.0 pg   MCHC 32.7 30.0 - 36.0 g/dL   RDW 14.1 11.5 - 15.5 %   Platelets 189 150 - 400 K/uL   Neutrophils Relative % 78 %   Neutro Abs 6.7 1.7 - 7.7 K/uL   Lymphocytes Relative 12 %   Lymphs Abs 1.0 0.7 - 4.0 K/uL   Monocytes Relative 10 %   Monocytes Absolute 0.9 0.1 - 1.0 K/uL   Eosinophils Relative 0 %    Eosinophils Absolute 0.0 0.0 - 0.7 K/uL   Basophils Relative 0 %   Basophils Absolute 0.0 0.0 - 0.1 K/uL    Comment: Performed at [facility], [address] [place], [place] [address]  Comprehensive metabolic panel     Status: Abnormal   Collection Time: 12/05/17 10:03 AM  Result  Value Ref Range   Sodium 139 135 - 145 mmol/L   Potassium 3.8 3.5 - 5.1 mmol/L   Chloride 104 98 - 111 mmol/L    Comment: Please note change in reference range.   CO2 26 22 - 32 mmol/L   Glucose, Bld 125 (H) 70 - 99 mg/dL    Comment: Please note change in reference range.   BUN 23 8 - 23 mg/dL    Comment: Please note change in reference range.   Creatinine, Ser 0.91 0.61 - 1.24 mg/dL   Calcium 9.3 8.9 - 10.3 mg/dL   Total Protein 7.1 6.5 - 8.1 g/dL   Albumin 3.8 3.5 - 5.0 g/dL   AST 19 15 - 41 U/L   ALT 17 0 - 44 U/L    Comment: Please note change in reference range.   Alkaline Phosphatase 73 38 - 126 U/L   Total Bilirubin 1.3 (H) 0.3 - 1.2 mg/dL   GFR calc non Af Amer >60 >60 mL/min   GFR calc Af Amer >60 >60 mL/min    Comment: (NOTE) The eGFR has been calculated using the CKD EPI equation. This calculation has not been validated in all clinical situations. eGFR's persistently <60 mL/min signify possible Chronic Kidney Disease.    Anion gap 9 5 - 15    Comment: Performed at [facility], [address], [place], [place] [address]  Lipase, blood     Status: None   Collection Time: 12/05/17 10:03 AM  Result Value Ref Range   Lipase 32 11 - 51 U/L    Comment: Performed at [facility], [address], [place], [place] [address]  Troponin I     Status: None   Collection Time: 12/05/17 10:03 AM  Result Value Ref Range   Troponin I <0.03 <0.03 ng/mL    Comment: Performed at [facility], [address], [place], [place] [address]  Urinalysis, Routine w reflex microscopic     Status: Abnormal   Collection Time: 12/05/17  1:30 PM  Result Value Ref Range   Color, Urine YELLOW YELLOW   APPearance HAZY  (A) CLEAR   Specific Gravity, Urine >1.030 (H) 1.005 - 1.030   pH 6.0 5.0 - 8.0   Glucose, UA NEGATIVE NEGATIVE mg/dL   Hgb urine dipstick MODERATE (A) NEGATIVE   Bilirubin Urine NEGATIVE NEGATIVE   Ketones, ur NEGATIVE NEGATIVE mg/dL   Protein, ur 100 (A) NEGATIVE mg/dL   Nitrite POSITIVE (A) NEGATIVE   Leukocytes, UA MODERATE (A) NEGATIVE    Comment: Performed at [facility], [address] [place], [place] [address]  Urinalysis, Microscopic (reflex)     Status: Abnormal   Collection Time: 12/05/17  1:30 PM  Result Value Ref Range   RBC / HPF 0-5 0 - 5 RBC/hpf   WBC, UA 11-20 0 - 5 WBC/hpf   Bacteria, UA FEW (A) NONE SEEN   Squamous Epithelial / LPF NONE SEEN 0 - 5   Urine-Other MICROSCOPIC EXAM PERFORMED ON UNCONCENTRATED URINE     Comment: Performed at [facility], [address] [place], [place] [address]  CBC     Status: Abnormal   Collection Time: 12/06/17  5:02 AM  Result Value Ref Range   WBC 5.3 4.0 - 10.5 K/uL   RBC 3.73 (L) 4.22 - 5.81 MIL/uL   Hemoglobin 11.2 (L) 13.0 - 17.0 g/dL   HCT 35.6 (L) 39.0 - 52.0 %   MCV 95.4 78.0 - 100.0 fL   MCH 30.0 26.0 -  34.0 pg   MCHC 31.5 30.0 - 36.0 g/dL   RDW 14.2 11.5 - 15.5 %   Platelets 151 150 - 400 K/uL    Comment: Performed at [facility], [address] [place], [place] [address]  Basic metabolic panel     Status: Abnormal   Collection Time: 12/06/17  5:02 AM  Result Value Ref Range   Sodium 145 135 - 145 mmol/L   Potassium 4.0 3.5 - 5.1 mmol/L   Chloride 109 98 - 111 mmol/L    Comment: Please note change in reference range.   CO2 28 22 - 32 mmol/L   Glucose, Bld 100 (H) 70 - 99 mg/dL    Comment: Please note change in reference range.   BUN 21 8 - 23 mg/dL    Comment: Please note change in reference range.   Creatinine, Ser 0.83 0.61 - 1.24 mg/dL   Calcium 8.7 (L) 8.9 - 10.3 mg/dL   GFR calc non Af Amer >60 >60 mL/min   GFR calc Af Amer >60 >60 mL/min    Comment: (NOTE) The eGFR has been calculated using the  CKD EPI equation. This calculation has not been validated in all clinical situations. eGFR's persistently <60 mL/min signify possible Chronic Kidney Disease.    Anion gap 8 5 - 15    Comment: Performed at [facility], [address], [place], [place] [address]   Personally reviewed- small bowel in the pelvis, mid ileum with slow taper to decompressed bowel, proximal with dilated bowel, no free fluid or free air, stomach distended   Ct Abdomen Pelvis W Contrast  Result Date: 12/05/2017 CLINICAL DATA:  Vomiting. EXAM: CT ABDOMEN AND PELVIS WITH CONTRAST TECHNIQUE: Multidetector CT imaging of the abdomen and pelvis was performed using the standard protocol following bolus administration of intravenous contrast. CONTRAST:  25m OMNIPAQUE IOHEXOL 300 MG/ML  SOLN COMPARISON:  Abdominal x-rays from same day. CT abdomen pelvis dated February 07, 2017. FINDINGS: Lower chest: Bibasilar atelectasis.  Cardiomegaly. Hepatobiliary: Unchanged 1.3 cm simple cyst in the caudate lobe. Status post cholecystectomy. No biliary dilatation. Pancreas: Unremarkable. No pancreatic ductal dilatation or surrounding inflammatory changes. Spleen: Normal in size without focal abnormality. Adrenals/Urinary Tract: The adrenal glands are unremarkable. Stable rim calcified bilateral complex renal cysts. Unchanged 5.6 x 4.8 cm heterogeneously enhancing lesion arising from the anterior left kidney. Unchanged hyperdense posterior interpolar mass measuring 2.4 x 1.4 cm. Slight interval increase in size of the simple cyst arising from the posterior lower pole of the left kidney. No renal or ureteral calculi. No hydronephrosis. Bladder is decompressed by Foley catheter. Stomach/Bowel: There are few mildly dilated loops of predominantly air-filled mid small bowel in the mid abdomen with gradual transition to nondilated small bowel in the central lower abdomen. The colon is unremarkable. Mild gastric distention. No bowel wall thickening or  surrounding inflammatory changes. Normal appendix. Vascular/Lymphatic: Aortic atherosclerosis. No enlarged abdominal or pelvic lymph nodes. Reproductive: Prostate is unremarkable. Other: No free fluid or pneumoperitoneum. Musculoskeletal: New age-indeterminate moderate L2 compression fracture. No retropulsion. Prior left hip arthroplasty. IMPRESSION: 1. Few mildly dilated loops of predominantly air-filled mid small bowel with gradual transition to non-dilated small bowel in the central lower abdomen. Findings are favored to reflect ileus or partial small bowel obstruction. 2. Grossly unchanged complex left renal mass suspicious for renal cell carcinoma. 3. New age-indeterminate moderate L2 compression fracture. Correlate with point tenderness. 4.  Aortic atherosclerosis (ICD10-I70.0). Electronically Signed   By: WTitus DubinM.D.   On:  12/05/2017 16:24   Dg Abd Acute W/chest  Result Date: 12/05/2017 CLINICAL DATA:  Vomiting over the last 3 days. EXAM: DG ABDOMEN ACUTE W/ 1V CHEST COMPARISON:  02/16/2017. FINDINGS: There are dilated fluid and air-filled loops of small intestine suggesting small bowel obstruction. No free air. Moderate amount of fecal matter in the colon. Vascular calcification is seen. There has been previous cholecystectomy. Calcifications associated with both kidneys as shown by CT. Partial compression fractures in the lumbar spine, possibly progressive since CT scan of September 2018. One-view chest shows cardiomegaly and aortic atherosclerosis. Patchy pulmonary scarring at the lung bases is a chronic finding. No sign of acute infiltrate, collapse or effusion. No free air. IMPRESSION: Findings suggesting small bowel obstruction.  No free air. Cardiomegaly and aortic atherosclerosis.  Pulmonary scarring. Electronically Signed   By: Nelson Chimes M.D.   On: 12/05/2017 13:47     Assessment & Plan:  BARAK BIALECKI is a 84 y.o. male with what is likely an ileus given the location and the  taper to decompressed bowel in the setting of a UTI.  The patient has a benign abdomen and has been having BMs up until Saturday. NG output dark but not a large quantity.    -NPO, NG in place, will await bowel function  -Replace lytes to K 4, Mg 2, Phos 3  All questions were answered to the satisfaction of the patient and the care giver.     Virl Cagey 12/06/2017, 9:57 AM

## 2017-12-07 DIAGNOSIS — R112 Nausea with vomiting, unspecified: Secondary | ICD-10-CM

## 2017-12-07 LAB — CBC WITH DIFFERENTIAL/PLATELET
BASOS PCT: 0 %
Basophils Absolute: 0 10*3/uL (ref 0.0–0.1)
EOS PCT: 0 %
Eosinophils Absolute: 0 10*3/uL (ref 0.0–0.7)
HCT: 36.8 % — ABNORMAL LOW (ref 39.0–52.0)
Hemoglobin: 11.6 g/dL — ABNORMAL LOW (ref 13.0–17.0)
Lymphocytes Relative: 13 %
Lymphs Abs: 0.8 10*3/uL (ref 0.7–4.0)
MCH: 30.1 pg (ref 26.0–34.0)
MCHC: 31.5 g/dL (ref 30.0–36.0)
MCV: 95.3 fL (ref 78.0–100.0)
MONO ABS: 0.7 10*3/uL (ref 0.1–1.0)
Monocytes Relative: 12 %
NEUTROS ABS: 4.4 10*3/uL (ref 1.7–7.7)
Neutrophils Relative %: 75 %
PLATELETS: 153 10*3/uL (ref 150–400)
RBC: 3.86 MIL/uL — AB (ref 4.22–5.81)
RDW: 14 % (ref 11.5–15.5)
WBC: 6 10*3/uL (ref 4.0–10.5)

## 2017-12-07 LAB — BASIC METABOLIC PANEL
ANION GAP: 9 (ref 5–15)
BUN: 19 mg/dL (ref 8–23)
CALCIUM: 8.8 mg/dL — AB (ref 8.9–10.3)
CO2: 26 mmol/L (ref 22–32)
Chloride: 108 mmol/L (ref 98–111)
Creatinine, Ser: 0.79 mg/dL (ref 0.61–1.24)
GFR calc Af Amer: >60 mL/min (ref >60)
GLUCOSE: 98 mg/dL (ref 70–99)
POTASSIUM: 3.6 mmol/L (ref 3.5–5.1)
SODIUM: 143 mmol/L (ref 135–145)

## 2017-12-07 LAB — MAGNESIUM: Magnesium: 1.9 mg/dL (ref 1.7–2.4)

## 2017-12-07 MED ORDER — MORPHINE SULFATE (PF) 2 MG/ML IV SOLN
0.5000 mg | Freq: Once | INTRAVENOUS | Status: AC
  Administered 2017-12-07: 0.5 mg via INTRAVENOUS
  Filled 2017-12-07: qty 1

## 2017-12-07 MED ORDER — SODIUM CHLORIDE 0.9 % IV SOLN
100.0000 mg | Freq: Two times a day (BID) | INTRAVENOUS | Status: DC
  Administered 2017-12-07 – 2017-12-08 (×4): 100 mg via INTRAVENOUS
  Filled 2017-12-07 (×7): qty 100

## 2017-12-07 NOTE — Progress Notes (Signed)
Pt had 3 different episodes of Vtach on telemetry. Pt's VSS. Pt alert and talking at that time. Caregiver at bedside, shaving patient. Dr. Wynetta Emery paged and made aware. Will continue to monitor.

## 2017-12-07 NOTE — Progress Notes (Signed)
Pt OOB to chair. Pt tolerated transfer from bed to chair well. One assist provided. Pt resting comfortably in chair at this time. Caregiver at bedside.

## 2017-12-07 NOTE — Progress Notes (Signed)
[facility] Progress Note     Subjective: No major issues. BM recorded but caregiver denies any BM. The family was there all day/ night. Ng with some output.   Objective: Vital signs in last 24 hours: Temp:  [97.6 F (36.4 C)-98.4 F (36.9 C)] 98 F (36.7 C) (07/16 0700) Pulse Rate:  [70-93] 78 (07/16 0700) Resp:  [16-18] 16 (07/16 0700) BP: (128-172)/(70-91) 172/90 (07/16 0700) SpO2:  [92 %-94 %] 93 % (07/16 0214) Last BM Date: 12/03/17  Intake/Output from previous day: 07/15 0701 - 07/16 0700 In: 788.6 [I.V.:588.6; IV Piggyback:200] Out: 2250 [Urine:800; Emesis/NG output:950; Stool:500] Intake/Output this shift: Total I/O In: 1542.2 [I.V.:1323.8; IV Piggyback:218.3] Out: -   General appearance: alert, cooperative and no distress Resp: normal work breathing GI: soft, non-tender; bowel sounds normal; no masses,  no organomegaly  Lab Results:  Recent Labs    12/06/17 0502 12/07/17 0419  WBC 5.3 6.0  HGB 11.2* 11.6*  HCT 35.6* 36.8*  PLT 151 153   BMET Recent Labs    12/06/17 0502 12/07/17 0419  NA 145 143  K 4.0 3.6  CL 109 108  CO2 28 26  GLUCOSE 100* 98  BUN 21 19  CREATININE 0.83 0.79  CALCIUM 8.7* 8.8*   PT/INR No results for input(s): LABPROT, INR in the last 72 hours.  Studies/Results: Ct Abdomen Pelvis W Contrast  Result Date: 12/05/2017 CLINICAL DATA:  Vomiting. EXAM: CT ABDOMEN AND PELVIS WITH CONTRAST TECHNIQUE: Multidetector CT imaging of the abdomen and pelvis was performed using the standard protocol following bolus administration of intravenous contrast. CONTRAST:  35mL OMNIPAQUE IOHEXOL 300 MG/ML  SOLN COMPARISON:  Abdominal x-rays from same day. CT abdomen pelvis dated February 07, 2017. FINDINGS: Lower chest: Bibasilar atelectasis.  Cardiomegaly. Hepatobiliary: Unchanged 1.3 cm simple cyst in the caudate lobe. Status post cholecystectomy. No biliary dilatation. Pancreas: Unremarkable. No pancreatic ductal dilatation or  surrounding inflammatory changes. Spleen: Normal in size without focal abnormality. Adrenals/Urinary Tract: The adrenal glands are unremarkable. Stable rim calcified bilateral complex renal cysts. Unchanged 5.6 x 4.8 cm heterogeneously enhancing lesion arising from the anterior left kidney. Unchanged hyperdense posterior interpolar mass measuring 2.4 x 1.4 cm. Slight interval increase in size of the simple cyst arising from the posterior lower pole of the left kidney. No renal or ureteral calculi. No hydronephrosis. Bladder is decompressed by Foley catheter. Stomach/Bowel: There are few mildly dilated loops of predominantly air-filled mid small bowel in the mid abdomen with gradual transition to nondilated small bowel in the central lower abdomen. The colon is unremarkable. Mild gastric distention. No bowel wall thickening or surrounding inflammatory changes. Normal appendix. Vascular/Lymphatic: Aortic atherosclerosis. No enlarged abdominal or pelvic lymph nodes. Reproductive: Prostate is unremarkable. Other: No free fluid or pneumoperitoneum. Musculoskeletal: New age-indeterminate moderate L2 compression fracture. No retropulsion. Prior left hip arthroplasty. IMPRESSION: 1. Few mildly dilated loops of predominantly air-filled mid small bowel with gradual transition to non-dilated small bowel in the central lower abdomen. Findings are favored to reflect ileus or partial small bowel obstruction. 2. Grossly unchanged complex left renal mass suspicious for renal cell carcinoma. 3. New age-indeterminate moderate L2 compression fracture. Correlate with point tenderness. 4.  Aortic atherosclerosis (ICD10-I70.0). Electronically Signed   By: Titus Dubin M.D.   On: 12/05/2017 16:24   Dg Abd Acute W/chest  Result Date: 12/05/2017 CLINICAL DATA:  Vomiting over the last 3 days. EXAM: DG ABDOMEN ACUTE W/ 1V CHEST COMPARISON:  02/16/2017. FINDINGS: There are dilated fluid and air-filled loops  of small intestine suggesting  small bowel obstruction. No free air. Moderate amount of fecal matter in the colon. Vascular calcification is seen. There has been previous cholecystectomy. Calcifications associated with both kidneys as shown by CT. Partial compression fractures in the lumbar spine, possibly progressive since CT scan of September 2018. One-view chest shows cardiomegaly and aortic atherosclerosis. Patchy pulmonary scarring at the lung bases is a chronic finding. No sign of acute infiltrate, collapse or effusion. No free air. IMPRESSION: Findings suggesting small bowel obstruction.  No free air. Cardiomegaly and aortic atherosclerosis.  Pulmonary scarring. Electronically Signed   By: Nelson Chimes M.D.   On: 12/05/2017 13:47    Anti-infectives: Anti-infectives (From admission, onward)   Start     Dose/Rate Route Frequency Ordered Stop   12/07/17 1200  doxycycline (VIBRAMYCIN) 100 mg in sodium chloride 0.9 % 250 mL IVPB     100 mg 125 mL/hr over 120 Minutes Intravenous Every 12 hours 12/07/17 1103     12/05/17 1715  cefTRIAXone (ROCEPHIN) 1 g in sodium chloride 0.9 % 100 mL IVPB  Status:  Discontinued     1 g 200 mL/hr over 30 Minutes Intravenous Every 24 hours 12/05/17 1707 12/07/17 1103      Assessment/Plan: Ethan Faulkner is a 84 y.o. male with an ileus given the location and the taper to decompressed bowel in the setting of a UTI.    -NPO, NG in place, will await bowel function  -Replace lytes to K 4, Mg 2, Phos 3 -OOB and ambulate  Discussed with RN and Dr. Wynetta Emery. Patient uses walker at home. Dr. Wynetta Emery ordering Pt.     LOS: 2 days    Virl Cagey 12/07/2017

## 2017-12-07 NOTE — Progress Notes (Addendum)
PROGRESS NOTE    Ethan Faulkner  VFI:[id number]  DOB: 1916/04/22  DOA: 12/05/2017 PCP: Kathyrn Drown, MD   Brief Admission Hx: 84 y.o.malewith a history of HTN, hypothyroidism, h/o stroke with aphasia, a fib, chronic anticoagulation. Patient seen for vomiting since Friday. Patient is intolerant of oral food or liquids. Emesis described as stomach contents. No fevers, chills, stomach pain. Symptoms are worsening. No palliating or provoking factors.  Did have some constipation last week, but now having regular stools.   MDM/Assessment & Plan:   Hospital Problems: Principal Problem:   SBO (small bowel obstruction) (HCC) Active Problems:   Atrial fibrillation (HCC)   Subclinical hypothyroidism   Long term current use of anticoagulant therapy   Cerebrovascular accident (CVA) due to embolism of left middle cerebral artery ([place])   Acute lower UTI   Ileus (HCC)   Urinary tract infection associated with indwelling urethral catheter ([place])   1. SBO - Patient still has NG tube in place with dark, scant output. He is being kept NPO due to ileus obstruction. Patient is being followed by general surgery, Dr. Constance Haw. No BMs since Saturday. Will continue fluid repletion of potassium, magnesium and phos. 2. Ileus - ileus given the location and the taper to decompressed bowel in the setting of a UTI. Followed by General Surgery, Dr. Constance Haw. NPO until stabilized. 3. UTI - Preliminary urine culture results show greater than 100,000 count of Staff aureus. Will discontinue ceftriaxone IV and place on doxycycline IV. Urine culture sensitivity still pending. 4. Atrial fibrillation - Chronic problem. Metoprolol IV and anticoagulation with lovenox. 5. Cerebrovascular accident - Patient has chronic anticoagulation for previous CVA. Pt placed on lovonox while he is NPO. 6. Hypothyroidism - Synthroid IV ordered while NPO.   DVT prophylaxis: Lovenox Code Status: DNR Family Communication: Daughter and  Son at bedside. Disposition Plan: Discharge home with 24/7 care once medically cleared by general surgery.   Consultants:  General Surgery; Dr. Constance Haw   Antimicrobials:  Ceftriaxone Discontinued 12/07/2017  Doxycycline IV  Subjective: Patient states that he is hungry.  Objective: Vitals:   12/06/17 2033 12/06/17 2100 12/07/17 0214 12/07/17 0700  BP:  128/70 (!) 149/91 (!) 172/90  Pulse:  70 93 78  Resp:  18 17 16   Temp:  97.6 F (36.4 C) 98.4 F (36.9 C) 98 F (36.7 C)  TempSrc:  Oral Oral   SpO2: 92% 94% 93%   Weight:      Height:        Intake/Output Summary (Last 24 hours) at 12/07/2017 1026 Last data filed at 12/07/2017 4166 Gross per 24 hour  Intake 2265.74 ml  Output 2250 ml  Net 15.74 ml   Filed Weights   12/05/17 0947 12/05/17 1851  Weight: 68 kg (150 lb) 69.5 kg (153 lb 3.5 oz)     REVIEW OF SYSTEMS  As per history otherwise all reviewed and reported negative  Exam:  General exam: Calm, cooperative. NAD. Respiratory system: Clear. No increased work of breathing. Cardiovascular system: S1 & S2 heard, RRR. No JVD, murmurs, gallops, clicks or pedal edema. Gastrointestinal system: Abdomen is nondistended, soft and nontender. Normal bowel sounds heard. Central nervous system: Alert and oriented. No focal neurological deficits. Extremities: no CCE.  Data Reviewed: Basic Metabolic Panel: Recent Labs  Lab 12/05/17 1003 12/06/17 0502 12/07/17 0419  NA 139 145 143  K 3.8 4.0 3.6  CL 104 109 108  CO2 26 28 26   GLUCOSE 125* 100* 98  BUN 23 21  19  CREATININE 0.91 0.83 0.79  CALCIUM 9.3 8.7* 8.8*  MG  --   --  1.9   Liver Function Tests: Recent Labs  Lab 12/05/17 1003  AST 19  ALT 17  ALKPHOS 73  BILITOT 1.3*  PROT 7.1  ALBUMIN 3.8   Recent Labs  Lab 12/05/17 1003  LIPASE 32   No results for input(s): AMMONIA in the last 168 hours. CBC: Recent Labs  Lab 12/05/17 1003 12/06/17 0502 12/07/17 0419  WBC 8.6 5.3 6.0  NEUTROABS  6.7  --  4.4  HGB 12.9* 11.2* 11.6*  HCT 39.5 35.6* 36.8*  MCV 93.4 95.4 95.3  PLT 189 151 153   Cardiac Enzymes: Recent Labs  Lab 12/05/17 1003  TROPONINI <0.03   CBG (last 3)  No results for input(s): GLUCAP in the last 72 hours. Recent Results (from the past 240 hour(s))  Urine culture     Status: Abnormal (Preliminary result)   Collection Time: 12/05/17  1:43 PM  Result Value Ref Range Status   Specimen Description   Final    URINE, CATHETERIZED Performed at [facility], [address] [place], [place] [address]    Special Requests   Final    Immunocompromised Performed at [facility], [address], [place], [place] [address]    Culture (A)  Final    >=100,000 COLONIES/mL STAPHYLOCOCCUS AUREUS SUSCEPTIBILITIES TO FOLLOW Performed at [facility] [address] [place], [place] [address]    Report Status PENDING  Incomplete     Studies: Ct Abdomen Pelvis W Contrast  Result Date: 12/05/2017 CLINICAL DATA:  Vomiting. EXAM: CT ABDOMEN AND PELVIS WITH CONTRAST TECHNIQUE: Multidetector CT imaging of the abdomen and pelvis was performed using the standard protocol following bolus administration of intravenous contrast. CONTRAST:  25mL OMNIPAQUE IOHEXOL 300 MG/ML  SOLN COMPARISON:  Abdominal x-rays from same day. CT abdomen pelvis dated February 07, 2017. FINDINGS: Lower chest: Bibasilar atelectasis.  Cardiomegaly. Hepatobiliary: Unchanged 1.3 cm simple cyst in the caudate lobe. Status post cholecystectomy. No biliary dilatation. Pancreas: Unremarkable. No pancreatic ductal dilatation or surrounding inflammatory changes. Spleen: Normal in size without focal abnormality. Adrenals/Urinary Tract: The adrenal glands are unremarkable. Stable rim calcified bilateral complex renal cysts. Unchanged 5.6 x 4.8 cm heterogeneously enhancing lesion arising from the anterior left kidney. Unchanged hyperdense posterior interpolar mass measuring 2.4 x 1.4 cm. Slight interval increase in  size of the simple cyst arising from the posterior lower pole of the left kidney. No renal or ureteral calculi. No hydronephrosis. Bladder is decompressed by Foley catheter. Stomach/Bowel: There are few mildly dilated loops of predominantly air-filled mid small bowel in the mid abdomen with gradual transition to nondilated small bowel in the central lower abdomen. The colon is unremarkable. Mild gastric distention. No bowel wall thickening or surrounding inflammatory changes. Normal appendix. Vascular/Lymphatic: Aortic atherosclerosis. No enlarged abdominal or pelvic lymph nodes. Reproductive: Prostate is unremarkable. Other: No free fluid or pneumoperitoneum. Musculoskeletal: New age-indeterminate moderate L2 compression fracture. No retropulsion. Prior left hip arthroplasty. IMPRESSION: 1. Few mildly dilated loops of predominantly air-filled mid small bowel with gradual transition to non-dilated small bowel in the central lower abdomen. Findings are favored to reflect ileus or partial small bowel obstruction. 2. Grossly unchanged complex left renal mass suspicious for renal cell carcinoma. 3. New age-indeterminate moderate L2 compression fracture. Correlate with point tenderness. 4.  Aortic atherosclerosis (ICD10-I70.0). Electronically Signed   By: Titus Dubin M.D.   On: 12/05/2017 16:24   Dg  Abd Acute W/chest  Result Date: 12/05/2017 CLINICAL DATA:  Vomiting over the last 3 days. EXAM: DG ABDOMEN ACUTE W/ 1V CHEST COMPARISON:  02/16/2017. FINDINGS: There are dilated fluid and air-filled loops of small intestine suggesting small bowel obstruction. No free air. Moderate amount of fecal matter in the colon. Vascular calcification is seen. There has been previous cholecystectomy. Calcifications associated with both kidneys as shown by CT. Partial compression fractures in the lumbar spine, possibly progressive since CT scan of September 2018. One-view chest shows cardiomegaly and aortic atherosclerosis. Patchy  pulmonary scarring at the lung bases is a chronic finding. No sign of acute infiltrate, collapse or effusion. No free air. IMPRESSION: Findings suggesting small bowel obstruction.  No free air. Cardiomegaly and aortic atherosclerosis.  Pulmonary scarring. Electronically Signed   By: Nelson Chimes M.D.   On: 12/05/2017 13:47     Scheduled Meds: . chlorhexidine  15 mL Mouth Rinse BID  . enoxaparin (LOVENOX) injection  1 mg/kg Subcutaneous Q12H  . levothyroxine  12.5 mcg Intravenous Daily  . mouth rinse  15 mL Mouth Rinse q12n4p  . metoprolol tartrate  2.5 mg Intravenous Q6H   Continuous Infusions: . 0.9 % NaCl with KCl 20 mEq / L 65 mL/hr at 12/07/17 0952  . cefTRIAXone (ROCEPHIN)  IV Stopped (12/06/17 1651)  . famotidine (PEPCID) IV Stopped (12/07/17 7510)    Principal Problem:   SBO (small bowel obstruction) (HCC) Active Problems:   Atrial fibrillation (HCC)   Subclinical hypothyroidism   Long term current use of anticoagulant therapy   Cerebrovascular accident (CVA) due to embolism of left middle cerebral artery ([place])   Acute lower UTI   Ileus (HCC)   Urinary tract infection associated with indwelling urethral catheter ([place])   Time spent:   Ashok Norris, Student AGACNP  Attending:  Irwin Brakeman, MD, FAAFP [facility] Pager [phone]  If 7PM-7AM, please contact night-coverage [email or web address] Password [email or web address] 12/07/2017, 10:26 AM    LOS: 2 days

## 2017-12-08 ENCOUNTER — Inpatient Hospital Stay (HOSPITAL_COMMUNITY): Payer: Medicare Other

## 2017-12-08 DIAGNOSIS — E876 Hypokalemia: Secondary | ICD-10-CM

## 2017-12-08 LAB — CBC WITH DIFFERENTIAL/PLATELET
BASOS ABS: 0 10*3/uL (ref 0.0–0.1)
Basophils Relative: 0 %
Eosinophils Absolute: 0.1 10*3/uL (ref 0.0–0.7)
Eosinophils Relative: 1 %
HEMATOCRIT: 37.5 % — AB (ref 39.0–52.0)
HEMOGLOBIN: 12.2 g/dL — AB (ref 13.0–17.0)
Lymphocytes Relative: 16 %
Lymphs Abs: 0.9 10*3/uL (ref 0.7–4.0)
MCH: 30.6 pg (ref 26.0–34.0)
MCHC: 32.5 g/dL (ref 30.0–36.0)
MCV: 94 fL (ref 78.0–100.0)
Monocytes Absolute: 0.6 10*3/uL (ref 0.1–1.0)
Monocytes Relative: 11 %
NEUTROS ABS: 4 10*3/uL (ref 1.7–7.7)
Neutrophils Relative %: 72 %
Platelets: 141 10*3/uL — ABNORMAL LOW (ref 150–400)
RBC: 3.99 MIL/uL — AB (ref 4.22–5.81)
RDW: 13.9 % (ref 11.5–15.5)
WBC: 5.5 10*3/uL (ref 4.0–10.5)

## 2017-12-08 LAB — BASIC METABOLIC PANEL
ANION GAP: 9 (ref 5–15)
BUN: 17 mg/dL (ref 8–23)
CO2: 24 mmol/L (ref 22–32)
Calcium: 8.6 mg/dL — ABNORMAL LOW (ref 8.9–10.3)
Chloride: 108 mmol/L (ref 98–111)
Creatinine, Ser: 0.6 mg/dL — ABNORMAL LOW (ref 0.61–1.24)
GFR calc Af Amer: >60 mL/min (ref >60)
Glucose, Bld: 84 mg/dL (ref 70–99)
POTASSIUM: 3.2 mmol/L — AB (ref 3.5–5.1)
Sodium: 141 mmol/L (ref 135–145)

## 2017-12-08 MED ORDER — MAGNESIUM SULFATE 2 GM/50ML IV SOLN
2.0000 g | Freq: Once | INTRAVENOUS | Status: AC
  Administered 2017-12-08: 2 g via INTRAVENOUS
  Filled 2017-12-08: qty 50

## 2017-12-08 MED ORDER — POTASSIUM CHLORIDE 10 MEQ/100ML IV SOLN
INTRAVENOUS | Status: AC
  Administered 2017-12-08: 10 meq via INTRAVENOUS
  Filled 2017-12-08: qty 100

## 2017-12-08 MED ORDER — BISACODYL 10 MG RE SUPP
10.0000 mg | Freq: Every day | RECTAL | Status: DC
Start: 2017-12-08 — End: 2017-12-10
  Administered 2017-12-08 – 2017-12-10 (×2): 10 mg via RECTAL
  Filled 2017-12-08 (×2): qty 1

## 2017-12-08 MED ORDER — POTASSIUM CHLORIDE 10 MEQ/100ML IV SOLN
10.0000 meq | INTRAVENOUS | Status: AC
  Administered 2017-12-08 (×3): 10 meq via INTRAVENOUS
  Filled 2017-12-08 (×2): qty 100

## 2017-12-08 NOTE — Plan of Care (Signed)
Patient denies pain at this time.

## 2017-12-08 NOTE — Progress Notes (Signed)
Assisted to bed after being up in chair all afternoon at around 1815.  Given suppository and just now assisted to bathroom with urge for bm but did not have one.  No complaints of nausea and abd soft.   Son at bedside and to spend night

## 2017-12-08 NOTE — Plan of Care (Signed)
  Problem: Acute Rehab PT Goals(only PT should resolve) Goal: Pt Will Go Supine/Side To Sit Outcome: Progressing Flowsheets (Taken 12/08/2017 1116) Pt will go Supine/Side to Sit: with supervision Goal: Patient Will Transfer Sit To/From Stand Outcome: Progressing Flowsheets (Taken 12/08/2017 1116) Patient will transfer sit to/from stand: with min guard assist Goal: Pt Will Transfer Bed To Chair/Chair To Bed Outcome: Progressing Flowsheets (Taken 12/08/2017 1116) Pt will Transfer Bed to Chair/Chair to Bed: min guard assist Goal: Pt Will Ambulate Outcome: Progressing Flowsheets (Taken 12/08/2017 1116) Pt will Ambulate: > 125 feet;with min guard assist   11:17 AM, 12/08/17 Lonell Grandchild, MPT Physical Therapist with [facility] [phone] office [phone] mobile phone

## 2017-12-08 NOTE — Progress Notes (Addendum)
PROGRESS NOTE    Ethan Faulkner  ZHG:[id number]  DOB: 01-25-1916  DOA: 12/05/2017 PCP: Kathyrn Drown, MD   Brief Admission Hx: 84 y.o.malewith a history of HTN, hypothyroidism, h/o stroke with aphasia, a fib, chronic anticoagulation. Patient seen for vomiting since Friday. Patient is intolerant of oral food or liquids. Emesis described as stomach contents. No fevers, chills, stomach pain. Symptoms are worsening. No palliating or provoking factors. Did have some constipation last week, but now having regular stools.   MDM/Assessment & Plan:   Hospital Problems: Principal Problem:   Small bowel obstruction ([place]) Active Problems:   Atrial fibrillation ([place])   Subclinical hypothyroidism   Long term current use of anticoagulant therapy   Cerebrovascular accident (CVA) due to embolism of left middle cerebral artery ([place])   Acute lower UTI   Ileus (HCC)   Urinary tract infection associated with indwelling urethral catheter (HCC)   Nausea and vomiting   Hypokalemia  1. SBO - Patient still has NG tube in place with dark, scant output. He is being kept NPO due to ileus obstruction. Patient is being followed by general surgery, Dr. Constance Haw. No BMs since Saturday. Will continue fluid repletion of potassium, magnesium and phos. Patient's abdomen is soft. There is clear red fluid from NG tube this morning. Patient was seen by PT and recommends continued PT in hospital and Home health PT and 24 hour supervision. 2. Ileus - ileus given the location and the taper to decompressed bowel in the setting of a UTI. Followed by General Surgery, Dr. Constance Haw. NPO until stabilized. 3. UTI - Preliminary urine culture results show greater than 100,000 count of Staff aureus. Will discontinue ceftriaxone IV and place on doxycycline IV. Patient to continue IV doxycycline to treat UTI per pharmacy consult. Urine Culture sensitivity pending. 4. Atrial fibrillation - Chronic problem. Metoprolol IV and  anticoagulation with lovenox. 5. Cerebrovascular accident - Patient has chronic anticoagulation for previous CVA. Pt placed on lovonox while he is NPO. 6. Hypothyroidism - Synthroid IV ordered while NPO.  7. Nausea and vomiting - NG tube in place. Ondansetron 4 mg IV ordered as needed. 8. Hypokalemia - patient's potassium was decreased this morning from 3.6 to 3.2. Potassium IV ordered for repletion. Will repeat labs in the morning.    DVT prophylaxis: Lovenox Code Status: DNR Family Communication: Daughter at beside. Disposition Plan: Discharge home with 24 per day care once cleared by general surgey, Dr. Constance Haw.   Consultants:  General Surgery  PT eval and treat  Antimicrobials:  Doxycycline IV   Subjective: Patient spoke a few incomprehensible words this morning. Daughter state that he is always this way early in the morning.  Objective: Vitals:   12/07/17 1553 12/07/17 1952 12/07/17 2224 12/08/17 0620  BP: (!) 151/80  (!) 167/89 (!) 159/87  Pulse: (!) 101  86 100  Resp: 20  17 17   Temp: 98.6 F (37 C)  98.8 F (37.1 C) 98.9 F (37.2 C)  TempSrc: Oral  Oral Oral  SpO2: 94% 94% 95% 97%  Weight:      Height:        Intake/Output Summary (Last 24 hours) at 12/08/2017 1208 Last data filed at 12/08/2017 0709 Gross per 24 hour  Intake 1418.67 ml  Output 2000 ml  Net -581.33 ml   Filed Weights   12/05/17 0947 12/05/17 1851  Weight: 68 kg (150 lb) 69.5 kg (153 lb 3.5 oz)     REVIEW OF SYSTEMS  As per history otherwise  all reviewed and reported negative  Exam:  General exam: Alert, cooperative, elderly man. NAD. Respiratory system: Clear. No increased work of breathing. Cardiovascular system: S1 & S2 heard, RRR. No JVD, murmurs, gallops, clicks or pedal edema. Gastrointestinal system: NG tube in place and hooked up to the intermediate suction. Abdomen is nondistended, soft and nontender. Normal bowel sounds heard. Central nervous system: Alert and oriented.  No focal neurological deficits. Extremities: no CCE.  Data Reviewed: Basic Metabolic Panel: Recent Labs  Lab 12/05/17 1003 12/06/17 0502 12/07/17 0419 12/08/17 0413  NA 139 145 143 141  K 3.8 4.0 3.6 3.2*  CL 104 109 108 108  CO2 26 28 26 24   GLUCOSE 125* 100* 98 84  BUN 23 21 19 17   CREATININE 0.91 0.83 0.79 0.60*  CALCIUM 9.3 8.7* 8.8* 8.6*  MG  --   --  1.9  --    Liver Function Tests: Recent Labs  Lab 12/05/17 1003  AST 19  ALT 17  ALKPHOS 73  BILITOT 1.3*  PROT 7.1  ALBUMIN 3.8   Recent Labs  Lab 12/05/17 1003  LIPASE 32   No results for input(s): AMMONIA in the last 168 hours. CBC: Recent Labs  Lab 12/05/17 1003 12/06/17 0502 12/07/17 0419 12/08/17 0413  WBC 8.6 5.3 6.0 5.5  NEUTROABS 6.7  --  4.4 4.0  HGB 12.9* 11.2* 11.6* 12.2*  HCT 39.5 35.6* 36.8* 37.5*  MCV 93.4 95.4 95.3 94.0  PLT 189 151 153 141*   Cardiac Enzymes: Recent Labs  Lab 12/05/17 1003  TROPONINI <0.03   CBG (last 3)  No results for input(s): GLUCAP in the last 72 hours. Recent Results (from the past 240 hour(s))  Urine culture     Status: Abnormal (Preliminary result)   Collection Time: 12/05/17  1:43 PM  Result Value Ref Range Status   Specimen Description   Final    URINE, CATHETERIZED Performed at [facility], [address] [place], [place] [address]    Special Requests   Final    Immunocompromised Performed at [facility], [address] [place], [place] [address]    Culture (A)  Final    >=100,000 COLONIES/mL STAPHYLOCOCCUS AUREUS SUSCEPTIBILITIES TO FOLLOW Performed at [facility] [address], [place], [place] [address]    Report Status PENDING  Incomplete     Studies: Dg Abd 1 View  Result Date: 12/08/2017 CLINICAL DATA:  Vomiting since Friday EXAM: ABDOMEN - 1 VIEW COMPARISON:  12/05/2017, CT FINDINGS: NG tube with tip in the GE junction. No dilated loops of large or small bowel. Vascular calcifications noted. Basilar atelectasis  IMPRESSION: 1. NG tube at the GE junction.  Consider advancement by 10 cm. 2. No evidence of bowel obstruction Electronically Signed   By: Suzy Bouchard M.D.   On: 12/08/2017 09:39     Scheduled Meds: . bisacodyl  10 mg Rectal Daily  . chlorhexidine  15 mL Mouth Rinse BID  . enoxaparin (LOVENOX) injection  1 mg/kg Subcutaneous Q12H  . levothyroxine  12.5 mcg Intravenous Daily  . mouth rinse  15 mL Mouth Rinse q12n4p  . metoprolol tartrate  2.5 mg Intravenous Q6H   Continuous Infusions: . 0.9 % NaCl with KCl 20 mEq / L 65 mL/hr at 12/08/17 0940  . doxycycline (VIBRAMYCIN) IV Stopped (12/08/17 0148)  . famotidine (PEPCID) IV Stopped (12/08/17 1042)    Principal Problem:   Small bowel obstruction (HCC) Active Problems:   Atrial fibrillation ([place])  Subclinical hypothyroidism   Long term current use of anticoagulant therapy   Cerebrovascular accident (CVA) due to embolism of left middle cerebral artery (HCC)   Acute lower UTI   Ileus (HCC)   Urinary tract infection associated with indwelling urethral catheter (HCC)   Nausea and vomiting   Hypokalemia   Time spent:   Ashok Norris, Student AGACNP  Attending:  Irwin Brakeman, MD, FAAFP [facility] Pager [phone]  If 7PM-7AM, please contact night-coverage [email or web address] Password [email or web address] 12/08/2017, 12:08 PM    LOS: 3 days

## 2017-12-08 NOTE — Progress Notes (Signed)
Ambulated to bathroom again and had bm, one small formed ball

## 2017-12-08 NOTE — Progress Notes (Signed)
[facility] Progress Note     Subjective: No issues. Minimal out NG. No flatus or BM reported. KUB reviewed and normal bowel gas pattern, some stool in left colon.   Objective: Vital signs in last 24 hours: Temp:  [98.6 F (37 C)-98.9 F (37.2 C)] 98.9 F (37.2 C) (07/17 0620) Pulse Rate:  [86-101] 100 (07/17 0620) Resp:  [17-20] 17 (07/17 0620) BP: (151-167)/(80-89) 159/87 (07/17 0620) SpO2:  [94 %-97 %] 97 % (07/17 0620) Last BM Date: 12/03/17  Intake/Output from previous day: 07/16 0701 - 07/17 0700 In: 2960.8 [I.V.:2242.5; IV Piggyback:718.3] Out: 1800 [Urine:1700; Emesis/NG output:100] Intake/Output this shift: Total I/O In: -  Out: 200 [Emesis/NG output:200]  General appearance: alert, cooperative and no distress Resp: normal work breathing GI: soft, non-tender; bowel sounds normal; no masses,  no organomegaly  Lab Results:  Recent Labs    12/07/17 0419 12/08/17 0413  WBC 6.0 5.5  HGB 11.6* 12.2*  HCT 36.8* 37.5*  PLT 153 141*   BMET Recent Labs    12/07/17 0419 12/08/17 0413  NA 143 141  K 3.6 3.2*  CL 108 108  CO2 26 24  GLUCOSE 98 84  BUN 19 17  CREATININE 0.79 0.60*  CALCIUM 8.8* 8.6*   PT/INR No results for input(s): LABPROT, INR in the last 72 hours.  Studies/Results: Dg Abd 1 View  Result Date: 12/08/2017 CLINICAL DATA:  Vomiting since Friday EXAM: ABDOMEN - 1 VIEW COMPARISON:  12/05/2017, CT FINDINGS: NG tube with tip in the GE junction. No dilated loops of large or small bowel. Vascular calcifications noted. Basilar atelectasis IMPRESSION: 1. NG tube at the GE junction.  Consider advancement by 10 cm. 2. No evidence of bowel obstruction Electronically Signed   By: Suzy Bouchard M.D.   On: 12/08/2017 09:39    Anti-infectives: Anti-infectives (From admission, onward)   Start     Dose/Rate Route Frequency Ordered Stop   12/07/17 1200  doxycycline (VIBRAMYCIN) 100 mg in sodium chloride 0.9 % 250 mL IVPB     100  mg 125 mL/hr over 120 Minutes Intravenous Every 12 hours 12/07/17 1103     12/05/17 1715  cefTRIAXone (ROCEPHIN) 1 g in sodium chloride 0.9 % 100 mL IVPB  Status:  Discontinued     1 g 200 mL/hr over 30 Minutes Intravenous Every 24 hours 12/05/17 1707 12/07/17 1103      Assessment/Plan: Gwyndolyn Saxon L Higdonis a 84 y.o.male with an ileus given the location and the taper to decompressed bowel in the setting of a UTI.   -NG removed, clear liquid diet  -OOB and ambulate   LOS: 3 days    Virl Cagey 12/08/2017

## 2017-12-08 NOTE — Progress Notes (Signed)
Denies nausea since NG removal around 1030 this morning.  Has had around 300 ml intake.  Sitter at bedside.

## 2017-12-08 NOTE — Evaluation (Signed)
Physical Therapy Evaluation Patient Details Name: Ethan Faulkner MRN: [id number] DOB: 1916-02-16 Today's Date: 12/08/2017   History of Present Illness   Ethan Faulkner is a 84 y.o. male with a history of HTN, hypothyroidism, h/o stroke with aphasia, a fib, chronic anticoagulation. Patient seen for vomiting since Friday. Patient is intolerant of oral food or liquids. Emesis described as stomach contents. No fevers, chills, stomach pain. Symptoms are worsening. No palliating or provoking factors.    Clinical Impression  Patient functioning near baseline for functional mobility and gait, requires frequent VC's for safety due to impulsive behavior, ambulated in hallway without loss of balance, limited secondary to c/o fatigue and tolerated sitting up in chair with family present after therapy.  Patient will benefit from continued physical therapy in hospital and recommended venue below to increase strength, balance, endurance for safe ADLs and gait.    Follow Up Recommendations Home health PT;Supervision/Assistance - 24 hour    Equipment Recommendations  None recommended by PT    Recommendations for Other Services       Precautions / Restrictions Precautions Precautions: Fall Restrictions Weight Bearing Restrictions: No      Mobility  Bed Mobility Overal bed mobility: Needs Assistance Bed Mobility: Supine to Sit     Supine to sit: Min guard     General bed mobility comments: labored movement  Transfers Overall transfer level: Needs assistance Equipment used: Rolling walker (2 wheeled) Transfers: Sit to/from [facility] Sit to Stand: Min assist Stand pivot transfers: Min assist          Ambulation/Gait Ambulation/Gait assistance: Min assist Gait Distance (Feet): 100 Feet Assistive device: Rolling walker (2 wheeled) Gait Pattern/deviations: Decreased weight shift to right;Decreased step length - right;Decreased step length - left;Decreased stride  length Gait velocity: near normal   General Gait Details: slightly unsteady labored cadence without loss of balance, limited secondary to c/o fatigue  Stairs            Wheelchair Mobility    Modified Rankin (Stroke Patients Only)       Balance Overall balance assessment: Needs assistance Sitting-balance support: Feet supported;No upper extremity supported Sitting balance-Leahy Scale: Good     Standing balance support: Bilateral upper extremity supported;During functional activity Standing balance-Leahy Scale: Fair                               Pertinent Vitals/Pain Pain Assessment: No/denies pain    Home Living Family/patient expects to be discharged to:: Private residence Living Arrangements: Children;Other (Comment) Available Help at Discharge: Family Type of Home: House Home Access: Stairs to enter Entrance Stairs-Rails: None Entrance Stairs-Number of Steps: 1 Home Layout: One level Home Equipment: Cane - single point;Walker - 2 wheels;Wheelchair - manual;Shower seat;Bedside commode      Prior Function Level of Independence: Needs assistance   Gait / Transfers Assistance Needed: household ambulator with RW and assistance  ADL's / Homemaking Assistance Needed: family assist        Hand Dominance   Dominant Hand: Right    Extremity/Trunk Assessment   Upper Extremity Assessment Upper Extremity Assessment: Generalized weakness    Lower Extremity Assessment Lower Extremity Assessment: Generalized weakness    Cervical / Trunk Assessment Cervical / Trunk Assessment: Normal  Communication   Communication: Expressive difficulties  Cognition Arousal/Alertness: Awake/alert Behavior During Therapy: WFL for tasks assessed/performed Overall Cognitive Status: Within Functional Limits for tasks assessed  General Comments      Exercises     Assessment/Plan    PT Assessment  Patient needs continued PT services  PT Problem List Decreased strength;Decreased activity tolerance;Decreased balance;Decreased mobility       PT Treatment Interventions Gait training;Stair training;Functional mobility training;Therapeutic activities;Therapeutic exercise;Patient/family education    PT Goals (Current goals can be found in the Care Plan section)  Acute Rehab PT Goals Patient Stated Goal: return home with family to assist PT Goal Formulation: With patient/family Time For Goal Achievement: 12/15/17 Potential to Achieve Goals: Good    Frequency Min 3X/week   Barriers to discharge        Co-evaluation               AM-PAC PT "6 Clicks" Daily Activity  Outcome Measure Difficulty turning over in bed (including adjusting bedclothes, sheets and blankets)?: A Little Difficulty moving from lying on back to sitting on the side of the bed? : A Little Difficulty sitting down on and standing up from a chair with arms (e.g., wheelchair, bedside commode, etc,.)?: A Little Help needed moving to and from a bed to chair (including a wheelchair)?: A Little Help needed walking in hospital room?: A Little Help needed climbing 3-5 steps with a railing? : A Lot 6 Click Score: 17    End of Session   Activity Tolerance: Patient tolerated treatment well;Patient limited by fatigue Patient left: in chair;with call bell/phone within reach;with family/visitor present Nurse Communication: Mobility status PT Visit Diagnosis: Unsteadiness on feet (R26.81);Other abnormalities of gait and mobility (R26.89);Muscle weakness (generalized) (M62.81)    Time: 7948-0165 PT Time Calculation (min) (ACUTE ONLY): 31 min   Charges:     PT Treatments $Therapeutic Activity: 23-37 mins   PT G Codes:        11:14 AM, December 27, 2017 Lonell Grandchild, MPT Physical Therapist with [facility] [phone] office [phone] mobile phone

## 2017-12-09 ENCOUNTER — Encounter (HOSPITAL_COMMUNITY): Payer: Self-pay | Admitting: Family Medicine

## 2017-12-09 LAB — BASIC METABOLIC PANEL
Anion gap: 7 (ref 5–15)
BUN: 18 mg/dL (ref 8–23)
CHLORIDE: 107 mmol/L (ref 98–111)
CO2: 26 mmol/L (ref 22–32)
CREATININE: 0.66 mg/dL (ref 0.61–1.24)
Calcium: 8.4 mg/dL — ABNORMAL LOW (ref 8.9–10.3)
GFR calc Af Amer: >60 mL/min (ref >60)
GFR calc non Af Amer: >60 mL/min (ref >60)
GLUCOSE: 88 mg/dL (ref 70–99)
Potassium: 3.2 mmol/L — ABNORMAL LOW (ref 3.5–5.1)
SODIUM: 140 mmol/L (ref 135–145)

## 2017-12-09 LAB — CBC
HEMATOCRIT: 35.8 % — AB (ref 39.0–52.0)
HEMOGLOBIN: 11.7 g/dL — AB (ref 13.0–17.0)
MCH: 30.6 pg (ref 26.0–34.0)
MCHC: 32.7 g/dL (ref 30.0–36.0)
MCV: 93.7 fL (ref 78.0–100.0)
Platelets: 154 10*3/uL (ref 150–400)
RBC: 3.82 MIL/uL — ABNORMAL LOW (ref 4.22–5.81)
RDW: 14 % (ref 11.5–15.5)
WBC: 4.9 10*3/uL (ref 4.0–10.5)

## 2017-12-09 LAB — MAGNESIUM: MAGNESIUM: 1.9 mg/dL (ref 1.7–2.4)

## 2017-12-09 MED ORDER — METOPROLOL SUCCINATE ER 25 MG PO TB24
25.0000 mg | ORAL_TABLET | Freq: Every day | ORAL | Status: DC
  Administered 2017-12-09 – 2017-12-10 (×2): 25 mg via ORAL
  Filled 2017-12-09 (×2): qty 1

## 2017-12-09 MED ORDER — POTASSIUM CHLORIDE 10 MEQ/100ML IV SOLN
INTRAVENOUS | Status: AC
  Administered 2017-12-09: 10 meq via INTRAVENOUS
  Filled 2017-12-09: qty 200

## 2017-12-09 MED ORDER — APIXABAN 2.5 MG PO TABS
2.5000 mg | ORAL_TABLET | Freq: Two times a day (BID) | ORAL | Status: DC
  Administered 2017-12-10: 2.5 mg via ORAL
  Filled 2017-12-09: qty 1

## 2017-12-09 MED ORDER — ATORVASTATIN CALCIUM 20 MG PO TABS
20.0000 mg | ORAL_TABLET | Freq: Every day | ORAL | Status: DC
  Administered 2017-12-09: 20 mg via ORAL
  Filled 2017-12-09: qty 1

## 2017-12-09 MED ORDER — DOXYCYCLINE HYCLATE 100 MG PO TABS
100.0000 mg | ORAL_TABLET | Freq: Two times a day (BID) | ORAL | Status: DC
  Administered 2017-12-09 – 2017-12-10 (×2): 100 mg via ORAL
  Filled 2017-12-09 (×2): qty 1

## 2017-12-09 MED ORDER — QUETIAPINE FUMARATE 25 MG PO TABS: 50.0000 mg | ORAL_TABLET | Freq: Every evening | ORAL | Status: DC | PRN

## 2017-12-09 MED ORDER — AMLODIPINE BESYLATE 5 MG PO TABS
5.0000 mg | ORAL_TABLET | Freq: Every day | ORAL | Status: DC
  Administered 2017-12-09 – 2017-12-10 (×2): 5 mg via ORAL
  Filled 2017-12-09 (×3): qty 1

## 2017-12-09 MED ORDER — POTASSIUM CHLORIDE 10 MEQ/100ML IV SOLN
10.0000 meq | INTRAVENOUS | Status: AC
  Administered 2017-12-09 (×3): 10 meq via INTRAVENOUS
  Filled 2017-12-09 (×2): qty 100

## 2017-12-09 MED ORDER — LEVOTHYROXINE SODIUM 25 MCG PO TABS
25.0000 ug | ORAL_TABLET | Freq: Every day | ORAL | Status: DC
  Administered 2017-12-10: 25 ug via ORAL
  Filled 2017-12-09: qty 1

## 2017-12-09 NOTE — Progress Notes (Signed)
Continues to deny nausea.  Ate all of soup and some pudding.  Family at bedside.

## 2017-12-09 NOTE — Progress Notes (Signed)
Physical Therapy Treatment Patient Details Name: Ethan Faulkner MRN: [id number] DOB: June 29, 1915 Today's Date: 12/09/2017    History of Present Illness  Ethan Faulkner is a 84 y.o. male with a history of HTN, hypothyroidism, h/o stroke with aphasia, a fib, chronic anticoagulation. Patient seen for vomiting since Friday. Patient is intolerant of oral food or liquids. Emesis described as stomach contents. No fevers, chills, stomach pain. Symptoms are worsening. No palliating or provoking factors.    PT Comments    Patient present very lethargic requiring much time to become alert with family members encouraging/assisting.  After patient became alert, able to sit upright and limited to a few steps at bedside to transfer to chair.  Patient declined ambulation away from bedside due to fatigue and tolerated sitting up in chair with family members present after therapy.  Patient will benefit from continued physical therapy in hospital and recommended venue below to increase strength, balance, endurance for safe ADLs and gait.    Follow Up Recommendations  Home health PT;Supervision/Assistance - 24 hour     Equipment Recommendations  None recommended by PT    Recommendations for Other Services       Precautions / Restrictions Precautions Precautions: Fall Restrictions Weight Bearing Restrictions: No    Mobility  Bed Mobility Overal bed mobility: Needs Assistance Bed Mobility: Supine to Sit     Supine to sit: Mod assist     General bed mobility comments: mostly due to lethargy  Transfers Overall transfer level: Needs assistance Equipment used: Rolling walker (2 wheeled) Transfers: Sit to/from [facility] Sit to Stand: Min assist;Mod assist Stand pivot transfers: Min assist;Mod assist       General transfer comment: slow labored movement  Ambulation/Gait Ambulation/Gait assistance: Mod assist Gait Distance (Feet): 3 Feet Assistive device: Rolling walker  (2 wheeled) Gait Pattern/deviations: Decreased step length - right;Decreased step length - left;Decreased stride length Gait velocity: slow   General Gait Details: limited to 3-4 steps to tranfer to chair secondary to lethargy and c/o fatigue   Stairs             Wheelchair Mobility    Modified Rankin (Stroke Patients Only)       Balance Overall balance assessment: Needs assistance Sitting-balance support: Feet supported;No upper extremity supported Sitting balance-Leahy Scale: Fair     Standing balance support: Bilateral upper extremity supported;During functional activity Standing balance-Leahy Scale: Fair                              Cognition Arousal/Alertness: Awake/alert;Lethargic Behavior During Therapy: WFL for tasks assessed/performed Overall Cognitive Status: Within Functional Limits for tasks assessed                                 General Comments: took a long time to wake up      Exercises      General Comments        Pertinent Vitals/Pain Pain Assessment: Faces Faces Pain Scale: Hurts little more Pain Location: IV insertion dorsum of left hand Pain Descriptors / Indicators: Grimacing;Guarding Pain Intervention(s): Limited activity within patient's tolerance;Monitored during session    Home Living                      Prior Function            PT Goals (current goals can  now be found in the care plan section) Acute Rehab PT Goals Patient Stated Goal: return home with family to assist Time For Goal Achievement: 12/15/17 Potential to Achieve Goals: Good Progress towards PT goals: Progressing toward goals    Frequency    Min 3X/week      PT Plan Current plan remains appropriate    Co-evaluation              AM-PAC PT "6 Clicks" Daily Activity  Outcome Measure  Difficulty turning over in bed (including adjusting bedclothes, sheets and blankets)?: A Little Difficulty moving from lying  on back to sitting on the side of the bed? : A Lot Difficulty sitting down on and standing up from a chair with arms (e.g., wheelchair, bedside commode, etc,.)?: A Lot Help needed moving to and from a bed to chair (including a wheelchair)?: A Lot Help needed walking in hospital room?: A Lot Help needed climbing 3-5 steps with a railing? : A Lot 6 Click Score: 13    End of Session   Activity Tolerance: Patient limited by fatigue;Patient limited by lethargy Patient left: in chair;with call bell/phone within reach;with family/visitor present Nurse Communication: Mobility status PT Visit Diagnosis: Unsteadiness on feet (R26.81);Other abnormalities of gait and mobility (R26.89);Muscle weakness (generalized) (M62.81)     Time: 3235-5732 PT Time Calculation (min) (ACUTE ONLY): 25 min  Charges:  $Therapeutic Activity: 23-37 mins                    G Codes:       3:02 PM, 01-06-18 Lonell Grandchild, MPT Physical Therapist with [facility] [phone] office [phone] mobile phone

## 2017-12-09 NOTE — Progress Notes (Signed)
PROGRESS NOTE    Ethan Faulkner  VOZ:[id number]  DOB: [birth year]  DOA: 12/05/2017 PCP: Kathyrn Drown, MD  Brief Admission Hx: 84 y.o.malewith a history of HTN, hypothyroidism, h/o stroke with aphasia, a fib, chronic anticoagulation. Patient seen for vomiting since Friday. Patient is intolerant of oral food or liquids. Emesis described as stomach contents. No fevers, chills, stomach pain. Symptoms are worsening. No palliating or provoking factors. Did have some constipation last week, but now having regular stools.  MDM/Assessment & Plan:   Hospital Problems:  Principal Problem:   Small bowel obstruction ([place]) Active Problems:   Atrial fibrillation ([place])   Subclinical hypothyroidism   Long term current use of anticoagulant therapy   Cerebrovascular accident (CVA) due to embolism of left middle cerebral artery ([place])   Acute lower UTI   Ileus (HCC)   Urinary tract infection associated with indwelling urethral catheter (HCC)   Nausea and vomiting   Hypokalemia  1. SBO - Resolved now. NG removed and he is tolerating diet.  He has had some bowel movements.  Advancing diet today.  If he tolerates he could discharge home tomorrow.   2. Ileus - Resolved now.  He is having bowel movements and tolerating diet.  NG has been removed.  3. UTI - Preliminary urine culture results show greater than 100,000 count of Staff aureus.   Susceptibility testing still pending.  Continue doxycycline.  4. Sundowning - will order seroquel QHS to use as needed for acute symptoms. I spoke with son about it this morning because the patient had such a bad night.   5. Atrial fibrillation - Chronic problem. Metoprolol and anticoagulation with eliquis. 6. Cerebrovascular accident - Patient has chronic anticoagulation for previous CVA. Pt placed on lovonox while he is NPO but has been resumed on home eliquis. 7. Hypothyroidism - Synthroid IV ordered while NPO but now back on home oral dose.    8. Nausea and  vomiting - Resolved for now.  NG removed.  Ondansetron 4 mg IV ordered as needed. 9. Hypokalemia - Potassium IV ordered for repletion. Will repeat labs in the morning.  Magnesium within normal limits.   DVT prophylaxis: Eliquis Code Status: DNR Family Communication: son at beside. Disposition Plan: Discharge home.    Consultants:  General Surgery  PT eval and treat  Antimicrobials:  Doxycycline  Subjective: Patient resting comfortably this morning.  He had sundowning during the night.    Objective: Vitals:   12/08/17 1816 12/08/17 2032 12/08/17 2127 12/09/17 1508  BP: (!) 166/89  (!) 173/99 (!) 132/98  Pulse: 89  (!) 113 73  Resp: 16   16  Temp: 97.9 F (36.6 C)   98.3 F (36.8 C)  TempSrc: Oral   Oral  SpO2: 97% 94%  97%  Weight:      Height:        Intake/Output Summary (Last 24 hours) at 12/09/2017 1635 Last data filed at 12/09/2017 1500 Gross per 24 hour  Intake 1774 ml  Output 450 ml  Net 1324 ml   Filed Weights   12/05/17 0947 12/05/17 1851  Weight: 68 kg (150 lb) 69.5 kg (153 lb 3.5 oz)   REVIEW OF SYSTEMS  As per history otherwise all reviewed and reported negative  Exam:   General exam: Pt confused at times, cooperative, pleasant, elderly man appears younger than stated age. NAD. Respiratory system: Clear. No increased work of breathing. Cardiovascular system: S1 & S2 heard, RRR. No JVD, murmurs, gallops, clicks or pedal  edema. Gastrointestinal system: Abdomen is nondistended, soft and nontender. Normal bowel sounds heard. Central nervous system: Alert and oriented. No focal neurological deficits. Extremities: no CCE.  Data Reviewed: Basic Metabolic Panel: Recent Labs  Lab 12/05/17 1003 12/06/17 0502 12/07/17 0419 12/08/17 0413 12/09/17 0430  NA 139 145 143 141 140  K 3.8 4.0 3.6 3.2* 3.2*  CL 104 109 108 108 107  CO2 26 28 26 24 26   GLUCOSE 125* 100* 98 84 88  BUN 23 21 19 17 18   CREATININE 0.91 0.83 0.79 0.60* 0.66  CALCIUM 9.3 8.7*  8.8* 8.6* 8.4*  MG  --   --  1.9  --  1.9   Liver Function Tests: Recent Labs  Lab 12/05/17 1003  AST 19  ALT 17  ALKPHOS 73  BILITOT 1.3*  PROT 7.1  ALBUMIN 3.8   Recent Labs  Lab 12/05/17 1003  LIPASE 32   No results for input(s): AMMONIA in the last 168 hours. CBC: Recent Labs  Lab 12/05/17 1003 12/06/17 0502 12/07/17 0419 12/08/17 0413 12/09/17 0430  WBC 8.6 5.3 6.0 5.5 4.9  NEUTROABS 6.7  --  4.4 4.0  --   HGB 12.9* 11.2* 11.6* 12.2* 11.7*  HCT 39.5 35.6* 36.8* 37.5* 35.8*  MCV 93.4 95.4 95.3 94.0 93.7  PLT 189 151 153 141* 154   Cardiac Enzymes: Recent Labs  Lab 12/05/17 1003  TROPONINI <0.03   CBG (last 3)  No results for input(s): GLUCAP in the last 72 hours. Recent Results (from the past 240 hour(s))  Urine culture     Status: Abnormal (Preliminary result)   Collection Time: 12/05/17  1:43 PM  Result Value Ref Range Status   Specimen Description   Final    URINE, CATHETERIZED Performed at [facility], [address] [place], [place] [address]    Special Requests   Final    Immunocompromised Performed at [facility], [address], [place], [place] [address]    Culture (A)  Final    >=100,000 COLONIES/mL STAPHYLOCOCCUS AUREUS SUSCEPTIBILITIES TO FOLLOW Performed at [facility] [address] [place], [place] [address]    Report Status PENDING  Incomplete     Studies: Dg Abd 1 View  Result Date: 12/08/2017 CLINICAL DATA:  Vomiting since Friday EXAM: ABDOMEN - 1 VIEW COMPARISON:  12/05/2017, CT FINDINGS: NG tube with tip in the GE junction. No dilated loops of large or small bowel. Vascular calcifications noted. Basilar atelectasis IMPRESSION: 1. NG tube at the GE junction.  Consider advancement by 10 cm. 2. No evidence of bowel obstruction Electronically Signed   By: Suzy Bouchard M.D.   On: 12/08/2017 09:39     Scheduled Meds: . amLODipine  5 mg Oral Daily  . apixaban  2.5 mg Oral BID  . atorvastatin  20 mg Oral q1800  .  bisacodyl  10 mg Rectal Daily  . chlorhexidine  15 mL Mouth Rinse BID  . doxycycline  100 mg Oral Q12H  . [START ON 12/10/2017] levothyroxine  25 mcg Oral QAC breakfast  . mouth rinse  15 mL Mouth Rinse q12n4p  . metoprolol succinate  25 mg Oral Daily   Continuous Infusions: . 0.9 % NaCl with KCl 20 mEq / L 65 mL/hr at 12/09/17 0512  . famotidine (PEPCID) IV Stopped (12/09/17 1010)    Principal Problem:   Small bowel obstruction (HCC) Active Problems:   Atrial fibrillation ([place])   Subclinical hypothyroidism   Long term current use of  anticoagulant therapy   Cerebrovascular accident (CVA) due to embolism of left middle cerebral artery (HCC)   Acute lower UTI   Ileus (HCC)   Urinary tract infection associated with indwelling urethral catheter (HCC)   Nausea and vomiting   Hypokalemia   Time spent:   Irwin Brakeman, MD  Attending:  Irwin Brakeman, MD, FAAFP [facility] Pager [phone]  If 7PM-7AM, please contact night-coverage [email or web address] Password [email or web address] 12/09/2017, 4:35 PM    LOS: 4 days

## 2017-12-09 NOTE — Progress Notes (Signed)
[facility] Progress Note     Subjective: No major issues reported. Having Bms overnight. Tolerated clears.   Objective: Vital signs in last 24 hours: Temp:  [97.8 F (36.6 C)-97.9 F (36.6 C)] 97.9 F (36.6 C) (07/17 1816) Pulse Rate:  [89-113] 113 (07/17 2127) Resp:  [16-18] 16 (07/17 1816) BP: (140-173)/(69-99) 173/99 (07/17 2127) SpO2:  [94 %-97 %] 94 % (07/17 2032) Last BM Date: 12/08/17  Intake/Output from previous day: 07/17 0701 - 07/18 0700 In: 693.3 [P.O.:380; IV Piggyback:313.3] Out: 650 [Urine:450; Emesis/NG output:200] Intake/Output this shift: Total I/O In: 820 [P.O.:320; Other:500] Out: -   General appearance: alert, cooperative and no distress Resp: normal work breathing GI: soft, non-tender; bowel sounds normal; no masses,  no organomegaly  Lab Results:  Recent Labs    12/08/17 0413 12/09/17 0430  WBC 5.5 4.9  HGB 12.2* 11.7*  HCT 37.5* 35.8*  PLT 141* 154   BMET Recent Labs    12/08/17 0413 12/09/17 0430  NA 141 140  K 3.2* 3.2*  CL 108 107  CO2 24 26  GLUCOSE 84 88  BUN 17 18  CREATININE 0.60* 0.66  CALCIUM 8.6* 8.4*    Studies/Results: Dg Abd 1 View  Result Date: 12/08/2017 CLINICAL DATA:  Vomiting since Friday EXAM: ABDOMEN - 1 VIEW COMPARISON:  12/05/2017, CT FINDINGS: NG tube with tip in the GE junction. No dilated loops of large or small bowel. Vascular calcifications noted. Basilar atelectasis IMPRESSION: 1. NG tube at the GE junction.  Consider advancement by 10 cm. 2. No evidence of bowel obstruction Electronically Signed   By: Suzy Bouchard M.D.   On: 12/08/2017 09:39    Anti-infectives: Anti-infectives (From admission, onward)   Start     Dose/Rate Route Frequency Ordered Stop   12/09/17 1030  doxycycline (VIBRA-TABS) tablet 100 mg     100 mg Oral Every 12 hours 12/09/17 1020     12/07/17 1200  doxycycline (VIBRAMYCIN) 100 mg in sodium chloride 0.9 % 250 mL IVPB  Status:  Discontinued     100  mg 125 mL/hr over 120 Minutes Intravenous Every 12 hours 12/07/17 1103 12/09/17 1020   12/05/17 1715  cefTRIAXone (ROCEPHIN) 1 g in sodium chloride 0.9 % 100 mL IVPB  Status:  Discontinued     1 g 200 mL/hr over 30 Minutes Intravenous Every 24 hours 12/05/17 1707 12/07/17 1103      Assessment/Plan: Ethan Saxon L Higdonis a 84 y.o.malewith anileus given the location and the taper to decompressed bowel in the setting of a UTI.  -Diet adv as tolerated  -Can likely go home tomorrow -Family asking about changing foley due to UTI, will defer to Dr. Wynetta Emery, likely got the UTI with that change on 7/9  Discussed with Dr. Wynetta Emery. Will sign off. Please contact if issues or concerns.     LOS: 4 days    Ethan Faulkner 12/09/2017

## 2017-12-09 NOTE — Progress Notes (Signed)
Family stated patient difficult to wake when therapist began session.  Assisted to chair by therapist and patient declined walking.  Vitals checked and were good.  Patient able to follow commands and showed no stroke symptoms with equal grips, symmetrical smile, no drift.  Speech was somewhat unclear but family stated he talks that way at times since last stroke.  Family and sitter at bedside.

## 2017-12-09 NOTE — Progress Notes (Signed)
Son did not want his father disturbed for vitals or meds but will call us if and when he wakes.

## 2017-12-09 NOTE — Progress Notes (Signed)
Rounded on patient. Resting quietly in bed. Family in room. No distress noted. Will continue to monitor.

## 2017-12-10 ENCOUNTER — Other Ambulatory Visit: Payer: Self-pay | Admitting: Family Medicine

## 2017-12-10 DIAGNOSIS — T83511D Infection and inflammatory reaction due to indwelling urethral catheter, subsequent encounter: Secondary | ICD-10-CM

## 2017-12-10 LAB — BASIC METABOLIC PANEL
ANION GAP: 7 (ref 5–15)
BUN: 15 mg/dL (ref 8–23)
CHLORIDE: 108 mmol/L (ref 98–111)
CO2: 23 mmol/L (ref 22–32)
Calcium: 8.4 mg/dL — ABNORMAL LOW (ref 8.9–10.3)
Creatinine, Ser: 0.66 mg/dL (ref 0.61–1.24)
Glucose, Bld: 89 mg/dL (ref 70–99)
POTASSIUM: 3.6 mmol/L (ref 3.5–5.1)
SODIUM: 138 mmol/L (ref 135–145)

## 2017-12-10 LAB — URINE CULTURE: Culture: >=100000 — AB

## 2017-12-10 MED ORDER — DOXYCYCLINE HYCLATE 100 MG PO TABS: 100.0000 mg | ORAL_TABLET | Freq: Two times a day (BID) | ORAL | 0 refills | Status: AC

## 2017-12-10 NOTE — Care Management Note (Signed)
Case Management Note  Patient Details  Name: Ethan Faulkner MRN: [id number] Date of Birth: 04-02-1916  Expected Discharge Date:  12/10/17               Expected Discharge Plan:  [place]  In-House Referral:  NA  Discharge planning Services  CM Consult  Post Acute Care Choice:  Home Health Choice offered to:  Adult Children  HH Arranged:  PT HH Agency:  [facility]  Status of Service:  Completed, signed off  Additional Comments: DC home today. PT recommends HH PT. Pt's daughter [name]) agreeable, requests [facility] (used in the past). [name] aware HH has 48 hrs to make first visit. Juliann Pulse, [facility] rep, aware of DC today. Daughter has no concerns or questions about further DC needs.   Sherald Barge, RN 12/10/2017, 9:44 AM

## 2017-12-10 NOTE — Discharge Instructions (Signed)
Follow with Primary MD  Kathyrn Drown, MD  and other consultants as instructed your Hospitalist MD  Please get a complete blood count and chemistry panel checked by your Primary MD at your next visit, and again as instructed by your Primary MD.  Get Medicines reviewed and adjusted: Please take all your medications with you for your next visit with your Primary MD  Laboratory/radiological data: Please request your Primary MD to go over all hospital tests and procedure/radiological results at the follow up, please ask your Primary MD to get all Hospital records sent to his/her office.  In some cases, they will be blood work, cultures and biopsy results pending at the time of your discharge. Please request that your primary care M.D. follows up on these results.  Also Note the following: If you experience worsening of your admission symptoms, develop shortness of breath, life threatening emergency, suicidal or homicidal thoughts you must seek medical attention immediately by calling 911 or calling your MD immediately  if symptoms less severe.  You must read complete instructions/literature along with all the possible adverse reactions/side effects for all the Medicines you take and that have been prescribed to you. Take any new Medicines after you have completely understood and accpet all the possible adverse reactions/side effects.   Do not drive when taking Pain medications or sleeping medications (Benzodaizepines)  Do not take more than prescribed Pain, Sleep and Anxiety Medications. It is not advisable to combine anxiety,sleep and pain medications without talking with your primary care practitioner  Special Instructions: If you have smoked or chewed Tobacco  in the last 2 yrs please stop smoking, stop any regular Alcohol  and or any Recreational drug use.  Wear Seat belts while driving.  Please note: You were cared for by a hospitalist during your hospital stay. Once you are discharged,  your primary care physician will handle any further medical issues. Please note that NO REFILLS for any discharge medications will be authorized once you are discharged, as it is imperative that you return to your primary care physician (or establish a relationship with a primary care physician if you do not have one) for your post hospital discharge needs so that they can reassess your need for medications and monitor your lab values.     Catheter-Associated Urinary Tract Infection FAQs What is "catheter-associated urinary tract infection"? A urinary tract infection (also called UTI) is an infection in the urinary system, which includes the bladder (which stores the urine) and the kidneys (which filter the blood to make urine). Germs (for example, bacteria or yeasts) do not normally live in these areas; but if germs are introduced, an infection can occur. If you have a urinary catheter, germs can travel along the catheter and cause an infection in your bladder or your kidney; in that case it is called a catheter-associated urinary tract infection (or CA-UTI). What is a urinary catheter? A urinary catheter is a thin tube placed in the bladder to drain urine. Urine drains through the tube into a bag that collects the urine. A urinary catheter may be used:  If you are not able to urinate on your own  To measure the amount of urine that you make, for example, during intensive care  During and after some types of surgery  During some tests of the kidneys and bladder  People with urinary catheters have a much higher chance of getting a urinary tract infection than people who dont have a catheter. How do I  get a catheter-associated urinary tract infection (CA-UTI)? If germs enter the urinary tract, they may cause an infection. Many of the germs that cause a catheter-associated urinary tract infection are common germs found in your intestines that do not usually cause an infection there. Germs  can enter the urinary tract when the catheter is being put in or while the catheter remains in the bladder. What are the symptoms of a urinary tract infection? Some of the common symptoms of a urinary tract infection are:  Burning or pain in the lower abdomen (that is, below the stomach)  Fever  Bloody urine may be a sign of infection, but is also caused by other problems  Burning during urination or an increase in the frequency of urination after the catheter is removed.  Sometimes people with catheter-associated urinary tract infections do not have these symptoms of infection. Can catheter-associated urinary tract infections be treated? Yes, most catheter-associated urinary tract infections can be treated with antibiotics and removal or change of the catheter. Your doctor will determine which antibiotic is best for you. What are some of the things that hospitals are doing to prevent catheter-associated urinary tract infections? To prevent urinary tract infections, doctors and nurses take the following actions. Catheter insertion  Catheters are put in only when necessary and they are removed as soon as possible.  Only properly trained persons insert catheters using sterile (clean) technique.  The skin in the area where the catheter will be inserted is cleaned before inserting the catheter.  Other methods to drain the urine are sometimes used, such as: ? External catheters in men (these look like condoms and are placed over the penis rather than into the penis) ? Putting a temporary catheter in to drain the urine and removing it right away. This is called intermittent urethral catheterization.  Catheter care  Healthcare providers clean their hands by washing them with soap and water or using an alcohol-based hand rub before and after touching your catheter.  If you do not see your providers clean their hands, please ask them to do so.  Avoid disconnecting the catheter and drain  tube. This helps to prevent germs from getting into the catheter tube.  The catheter is secured to the leg to prevent pulling on the catheter.  Avoid twisting or kinking the catheter.  Keep the bag lower than the bladder to prevent urine from backflowing to the bladder.  Empty the bag regularly. The drainage spout should not touch anything while emptying the bag.  What can I do to help prevent catheter-associated urinary tract infections if I have a catheter?  Always clean your hands before and after doing catheter care.  Always keep your urine bag below the level of your bladder.  Do not tug or pull on the tubing.  Do not twist or kink the catheter tubing.  Ask your healthcare provider each day if you still need the catheter.  What do I need to do when I go home from the hospital?  If you will be going home with a catheter, your doctor or nurse should explain everything you need to know about taking care of the catheter. Make sure you understand how to care for it before you leave the hospital.  If you develop any of the symptoms of a urinary tract infection, such as burning or pain in the lower abdomen, fever, or an increase in the frequency of urination, contact your doctor or nurse immediately.  Before you go home, make sure  you know who to contact if you have questions or problems after you get home. If you have questions, please ask your doctor or nurse. Developed and co-sponsored by [employer] for [place] [phone]); Infectious Diseases Society of [place] (IDSA); [place]; Association for Professionals in Infection Control and Epidemiology (APIC); Centers for Disease Control and Prevention (CDC); and [employer]. This information is not intended to replace advice given to you by your health care provider. Make sure you discuss any questions you have with your health care provider. Document Released: 02/03/2012 Document  Revised: 10/23/2015 Document Reviewed: 07/25/2014 Elsevier Interactive Patient Education  2018 [employer] Bowel Obstruction A small bowel obstruction means that something is blocking the small bowel. The small bowel is also called the small intestine. It is the long tube that connects the stomach to the colon. An obstruction will stop food and fluids from passing through the small bowel. Treatment depends on what is causing the problem and how bad the problem is. Follow these instructions at home:  Get a lot of rest.  Follow your diet as told by your doctor. You may need to: ? Only drink clear liquids until you start to get better. ? Avoid solid foods as told by your doctor.  Take over-the-counter and prescription medicines only as told by your doctor.  Keep all follow-up visits as told by your doctor. This is important. Contact a doctor if:  You have a fever.  You have chills. Get help right away if:  You have pain or cramps that get worse.  You throw up (vomit) blood.  You have a feeling of being sick to your stomach (nausea) that does not go away.  You cannot stop throwing up.  You cannot drink fluids.  You feel confused.  You feel dry or thirsty (dehydrated).  Your belly gets more bloated.  You feel weak or you pass out (faint). This information is not intended to replace advice given to you by your health care provider. Make sure you discuss any questions you have with your health care provider. Document Released: 06/18/2004 Document Revised: 01/06/2016 Document Reviewed: 07/05/2014 Elsevier Interactive Patient Education  2018 [place] Meal Plan A soft-food meal plan includes foods that are safe and easy to swallow. This meal plan typically is used:  If you are having trouble chewing or swallowing foods.  As a transition meal plan after only having had liquid meals for a long period.  What do I need to know about the soft-food  meal plan? A soft-food meal plan includes tender foods that are soft and easy to chew and swallow. In most cases, bite-sized pieces of food are easier to swallow. A bite-sized piece is about  inch or smaller. Foods in this plan do not need to be ground or pureed. Foods that are very hard, crunchy, or sticky should be avoided. Also, breads, cereals, yogurts, and desserts with nuts, seeds, or fruits should be avoided. What foods can I eat? Grains Rice and wild rice. Moist bread, dressing, pasta, and noodles. Well-moistened dry or cooked cereals, such as farina (cooked wheat cereal), oatmeal, or grits. Biscuits, breads, muffins, pancakes, and waffles that have been well moistened. Vegetables Shredded lettuce. Cooked, tender vegetables, including potatoes without skins. Vegetable juices. Broths or creamed soups made with vegetables that are not stringy or chewy. Strained tomatoes (without seeds). Fruits Canned or well-cooked fruits. Soft (ripe), peeled fresh fruits, such as peaches,  nectarines, kiwi, cantaloupe, honeydew melon, and watermelon (without seeds). Soft berries with small seeds, such as strawberries. Fruit juices (without pulp). Meats and Other Protein Sources Moist, tender, lean beef. Mutton. Lamb. Veal. Chicken. [place]. Liver. Ham. Fish without bones. Eggs. Dairy Milk, milk drinks, and cream. Plain cream cheese and cottage cheese. Plain yogurt. Sweets/Desserts Flavored gelatin desserts. Custard. Plain ice cream, frozen yogurt, sherbet, milk shakes, and malts. Plain cakes and cookies. Plain hard candy. Other Butter, margarine (without trans fat), and cooking oils. Mayonnaise. Cream sauces. Mild spices, salt, and sugar. Syrup, molasses, honey, and jelly. The items listed above may not be a complete list of recommended foods or beverages. Contact your dietitian for more options. What foods are not recommended? Grains Dry bread, toast, crackers that have not been moistened. Coarse or dry  cereals, such as bran, granola, and shredded wheat. Tough or chewy crusty breads, such as [place] bread or baguettes. Vegetables Corn. Raw vegetables except shredded lettuce. Cooked vegetables that are tough or stringy. Tough, crisp, fried potatoes and potato skins. Fruits Fresh fruits with skins or seeds or both, such as apples, pears, or grapes. Stringy, high-pulp fruits, such as papaya, pineapple, coconut, or mango. Fruit leather, fruit roll-ups, and all dried fruits. Meats and Other Protein Sources Sausages and hot dogs. Meats with gristle. Fish with bones. Nuts, seeds, and chunky peanut or other nut butters. Sweets/Desserts Cakes or cookies that are very dry or chewy. The items listed above may not be a complete list of foods and beverages to avoid. Contact your dietitian for more information. This information is not intended to replace advice given to you by your health care provider. Make sure you discuss any questions you have with your health care provider. Document Released: 08/18/2007 Document Revised: 10/17/2015 Document Reviewed: 04/07/2013 Elsevier Interactive Patient Education  2017 [place].     Urinary Tract Infection, Adult A urinary tract infection (UTI) is an infection of any part of the urinary tract, which includes the kidneys, ureters, bladder, and urethra. These organs make, store, and get rid of urine in the body. UTI can be a bladder infection (cystitis) or kidney infection (pyelonephritis). What are the causes? This infection may be caused by fungi, viruses, or bacteria. Bacteria are the most common cause of UTIs. This condition can also be caused by repeated incomplete emptying of the bladder during urination. What increases the risk? This condition is more likely to develop if:  You ignore your need to urinate or hold urine for long periods of time.  You do not empty your bladder completely during urination.  You wipe back to front after urinating or  having a bowel movement, if you are male.  You are uncircumcised, if you are male.  You are constipated.  You have a urinary catheter that stays in place (indwelling).  You have a weak defense (immune) system.  You have a medical condition that affects your bowels, kidneys, or bladder.  You have diabetes.  You take antibiotic medicines frequently or for long periods of time, and the antibiotics no longer work well against certain types of infections (antibiotic resistance).  You take medicines that irritate your urinary tract.  You are exposed to chemicals that irritate your urinary tract.  You are male.  What are the signs or symptoms? Symptoms of this condition include:  Fever.  Frequent urination or passing small amounts of urine frequently.  Needing to urinate urgently.  Pain or burning with urination.  Urine that smells bad or unusual.  Cloudy urine.  Pain in the lower abdomen or back.  Trouble urinating.  Blood in the urine.  Vomiting or being less hungry than normal.  Diarrhea or abdominal pain.  Vaginal discharge, if you are male.  How is this diagnosed? This condition is diagnosed with a medical history and physical exam. You will also need to provide a urine sample to test your urine. Other tests may be done, including:  Blood tests.  Sexually transmitted disease (STD) testing.  If you have had more than one UTI, a cystoscopy or imaging studies may be done to determine the cause of the infections. How is this treated? Treatment for this condition often includes a combination of two or more of the following:  Antibiotic medicine.  Other medicines to treat less common causes of UTI.  Over-the-counter medicines to treat pain.  Drinking enough water to stay hydrated.  Follow these instructions at home:  Take over-the-counter and prescription medicines only as told by your health care provider.  If you were prescribed an antibiotic,  take it as told by your health care provider. Do not stop taking the antibiotic even if you start to feel better.  Avoid alcohol, caffeine, tea, and carbonated beverages. They can irritate your bladder.  Drink enough fluid to keep your urine clear or pale yellow.  Keep all follow-up visits as told by your health care provider. This is important.  Make sure to: ? Empty your bladder often and completely. Do not hold urine for long periods of time. ? Empty your bladder before and after sex. ? Wipe from front to back after a bowel movement if you are male. Use each tissue one time when you wipe. Contact a health care provider if:  You have back pain.  You have a fever.  You feel nauseous or vomit.  Your symptoms do not get better after 3 days.  Your symptoms go away and then return. Get help right away if:  You have severe back pain or lower abdominal pain.  You are vomiting and cannot keep down any medicines or water. This information is not intended to replace advice given to you by your health care provider. Make sure you discuss any questions you have with your health care provider. Document Released: 02/18/2005 Document Revised: 10/23/2015 Document Reviewed: 04/01/2015 Elsevier Interactive Patient Education  [employer].

## 2017-12-10 NOTE — Progress Notes (Signed)
Removed IV-lean, dry, intact. Reviewed d/c paperwork with daughter. Answered all questions. Wheeled stable patient and belongings to main entrance where he was picked up by daughter.

## 2017-12-10 NOTE — Discharge Summary (Addendum)
Physician Discharge Summary  Ethan Faulkner DJT:[id number] DOB: Sep 17, 1915 DOA: 12/05/2017  PCP: Ethan Drown, MD  Admit date: 12/05/2017 Discharge date: 12/10/2017  Admitted From: Home  Disposition: Home with HHPT and 24/7 supervision  Recommendations for Outpatient Follow-up:  1. Follow up with PCP in 1 weeks 2. Please obtain BMP/CBC in one week 3. Please follow up on the following pending results: Final culture data  Home Health: PT  Discharge Condition: STABLE  CODE STATUS: DNR    Brief Hospitalization Summary: Please see all hospital notes, images, labs for full details of the hospitalization.   HPI: Ethan Faulkner is a 84 y.o. male with a history of HTN, hypothyroidism, h/o stroke with aphasia, a fib, chronic anticoagulation. Patient seen for vomiting since Friday. Patient is intolerant of oral food or liquids. Emesis described as stomach contents. No fevers, chills, stomach pain. Symptoms are worsening. No palliating or provoking factors.  Did have some constipation last week, but now having regular stools.  Emergency Department Course: UA shows UTI. WBC elevated to 12. CT shows ileus or partial SBO   1. SBO - Resolved now. NG removed and he is tolerating diet.  He has had some bowel movements. He is tolerating soft diet. DC home today.    2. Ileus - Resolved now.  He is having bowel movements and tolerating diet.  NG has been removed.  3. UTI -Preliminary urine culture results show greater than 100,000 count of Staph aureus.  Susceptibility testing preliminary reports that is it sensitive to doxycycline.  Continue doxycycline x 5 more days.  4. Sundowning - Pt had a much better night, rested well and required no medications.    5. Atrial fibrillation - Chronic problem. Metoprolol and anticoagulation with eliquis. 6. Cerebrovascular accident - Patient has chronic anticoagulation for previous CVA. Pt placed on lovonox while he is NPO but has been resumed on home  eliquis. 7. Hypothyroidism - Synthroid IV ordered while NPO but now back on home oral dose.   8. Nausea and vomiting - Resolved for now.  NG removed.  Ondansetron 4 mg IV ordered as needed. 9. Hypokalemia - Repleted.  Magnesium within normal limits.   DVT prophylaxis: Eliquis Code Status: DNR Family Communication: son at beside. Disposition Plan: Discharge home.    Consultants:  General Surgery  Discharge Diagnoses:  Principal Problem:   Small bowel obstruction ([place]) Active Problems:   Atrial fibrillation (HCC)   Subclinical hypothyroidism   Long term current use of anticoagulant therapy   Cerebrovascular accident (CVA) due to embolism of left middle cerebral artery (HCC)   Acute lower UTI   Ileus (HCC)   Urinary tract infection associated with indwelling urethral catheter (HCC)   Nausea and vomiting   Hypokalemia  Discharge Instructions: Discharge Instructions    Call MD for:  difficulty breathing, headache or visual disturbances   Complete by:  As directed    Call MD for:  extreme fatigue   Complete by:  As directed    Call MD for:  persistant dizziness or light-headedness   Complete by:  As directed    Call MD for:  persistant nausea and vomiting   Complete by:  As directed    Call MD for:  severe uncontrolled pain   Complete by:  As directed    Diet - low sodium heart healthy   Complete by:  As directed    Discharge instructions   Complete by:  As directed    Soft Foods Diet Recommended  Increase activity slowly   Complete by:  As directed      Allergies as of 12/10/2017      Reactions   Xanax [alprazolam] Other (See Comments)   Dizzy, Sedated   Tape Rash   PATIENT'S SKIN WILL TEAR/PLEASE EITHER USE PAPER TAPE OR COBAN WRAP!!      Medication List    TAKE these medications   acetaminophen 325 MG tablet Commonly known as:  TYLENOL Take 325-650 mg by mouth every 6 (six) hours as needed for mild pain or moderate pain.   amLODipine 5 MG  tablet Commonly known as:  NORVASC Take 1 tablet (5 mg total) by mouth daily.   atorvastatin 20 MG tablet Commonly known as:  LIPITOR TAKE 1 TABLET BY MOUTH ONCE DAILY AT 6:00 PM.   cetirizine 10 MG tablet Commonly known as:  ZYRTEC Take 10 mg by mouth every evening.   doxycycline 100 MG tablet Commonly known as:  VIBRA-TABS Take 1 tablet (100 mg total) by mouth every 12 (twelve) hours for 5 days.   ELIQUIS 2.5 MG Tabs tablet Generic drug:  apixaban TAKE (1) TABLET BY MOUTH TWICE DAILY AT 9AM AND 9PM.   ketoconazole 2 % cream Commonly known as:  NIZORAL Apply 1 application 2 (two) times daily topically.   levothyroxine 25 MCG tablet Commonly known as:  SYNTHROID, LEVOTHROID TAKE 1 TABLET EVERY MORNING ON AN EMPTY STOMACH FOR THYROID.   metoprolol succinate 25 MG 24 hr tablet Commonly known as:  TOPROL-XL Take 1 tablet (25 mg total) by mouth every evening.   polyethylene glycol powder powder Commonly known as:  GLYCOLAX/MIRALAX MIX 1 CAPFUL IN 8 OZ OF WATER AND DRINK twice weekly What changed:  additional instructions      Follow-up Information    Luking, Elayne Snare, MD. Schedule an appointment as soon as possible for a visit in 1 week(s).   Specialty:  Family Medicine Contact information: [place] [address] [phone]          Allergies  Allergen Reactions  . Xanax [Alprazolam] Other (See Comments)    Dizzy, Sedated  . Tape Rash    PATIENT'S SKIN WILL TEAR/PLEASE EITHER USE PAPER TAPE OR COBAN WRAP!!   Allergies as of 12/10/2017      Reactions   Xanax [alprazolam] Other (See Comments)   Dizzy, Sedated   Tape Rash   PATIENT'S SKIN WILL TEAR/PLEASE EITHER USE PAPER TAPE OR COBAN WRAP!!      Medication List    TAKE these medications   acetaminophen 325 MG tablet Commonly known as:  TYLENOL Take 325-650 mg by mouth every 6 (six) hours as needed for mild pain or moderate pain.   amLODipine 5 MG tablet Commonly known as:   NORVASC Take 1 tablet (5 mg total) by mouth daily.   atorvastatin 20 MG tablet Commonly known as:  LIPITOR TAKE 1 TABLET BY MOUTH ONCE DAILY AT 6:00 PM.   cetirizine 10 MG tablet Commonly known as:  ZYRTEC Take 10 mg by mouth every evening.   doxycycline 100 MG tablet Commonly known as:  VIBRA-TABS Take 1 tablet (100 mg total) by mouth every 12 (twelve) hours for 5 days.   ELIQUIS 2.5 MG Tabs tablet Generic drug:  apixaban TAKE (1) TABLET BY MOUTH TWICE DAILY AT 9AM AND 9PM.   ketoconazole 2 % cream Commonly known as:  NIZORAL Apply 1 application 2 (two) times daily topically.   levothyroxine 25 MCG tablet Commonly known as:  SYNTHROID, LEVOTHROID TAKE 1 TABLET EVERY MORNING ON AN EMPTY STOMACH FOR THYROID.   metoprolol succinate 25 MG 24 hr tablet Commonly known as:  TOPROL-XL Take 1 tablet (25 mg total) by mouth every evening.   polyethylene glycol powder powder Commonly known as:  GLYCOLAX/MIRALAX MIX 1 CAPFUL IN 8 OZ OF WATER AND DRINK twice weekly What changed:  additional instructions       Procedures/Studies: Dg Abd 1 View  Result Date: 12/08/2017 CLINICAL DATA:  Vomiting since Friday EXAM: ABDOMEN - 1 VIEW COMPARISON:  12/05/2017, CT FINDINGS: NG tube with tip in the GE junction. No dilated loops of large or small bowel. Vascular calcifications noted. Basilar atelectasis IMPRESSION: 1. NG tube at the GE junction.  Consider advancement by 10 cm. 2. No evidence of bowel obstruction Electronically Signed   By: Suzy Bouchard M.D.   On: 12/08/2017 09:39   Ct Abdomen Pelvis W Contrast  Result Date: 12/05/2017 CLINICAL DATA:  Vomiting. EXAM: CT ABDOMEN AND PELVIS WITH CONTRAST TECHNIQUE: Multidetector CT imaging of the abdomen and pelvis was performed using the standard protocol following bolus administration of intravenous contrast. CONTRAST:  51mL OMNIPAQUE IOHEXOL 300 MG/ML  SOLN COMPARISON:  Abdominal x-rays from same day. CT abdomen pelvis dated February 07, 2017. FINDINGS: Lower chest: Bibasilar atelectasis.  Cardiomegaly. Hepatobiliary: Unchanged 1.3 cm simple cyst in the caudate lobe. Status post cholecystectomy. No biliary dilatation. Pancreas: Unremarkable. No pancreatic ductal dilatation or surrounding inflammatory changes. Spleen: Normal in size without focal abnormality. Adrenals/Urinary Tract: The adrenal glands are unremarkable. Stable rim calcified bilateral complex renal cysts. Unchanged 5.6 x 4.8 cm heterogeneously enhancing lesion arising from the anterior left kidney. Unchanged hyperdense posterior interpolar mass measuring 2.4 x 1.4 cm. Slight interval increase in size of the simple cyst arising from the posterior lower pole of the left kidney. No renal or ureteral calculi. No hydronephrosis. Bladder is decompressed by Foley catheter. Stomach/Bowel: There are few mildly dilated loops of predominantly air-filled mid small bowel in the mid abdomen with gradual transition to nondilated small bowel in the central lower abdomen. The colon is unremarkable. Mild gastric distention. No bowel wall thickening or surrounding inflammatory changes. Normal appendix. Vascular/Lymphatic: Aortic atherosclerosis. No enlarged abdominal or pelvic lymph nodes. Reproductive: Prostate is unremarkable. Other: No free fluid or pneumoperitoneum. Musculoskeletal: New age-indeterminate moderate L2 compression fracture. No retropulsion. Prior left hip arthroplasty. IMPRESSION: 1. Few mildly dilated loops of predominantly air-filled mid small bowel with gradual transition to non-dilated small bowel in the central lower abdomen. Findings are favored to reflect ileus or partial small bowel obstruction. 2. Grossly unchanged complex left renal mass suspicious for renal cell carcinoma. 3. New age-indeterminate moderate L2 compression fracture. Correlate with point tenderness. 4.  Aortic atherosclerosis (ICD10-I70.0). Electronically Signed   By: Titus Dubin M.D.   On: 12/05/2017 16:24    Dg Abd Acute W/chest  Result Date: 12/05/2017 CLINICAL DATA:  Vomiting over the last 3 days. EXAM: DG ABDOMEN ACUTE W/ 1V CHEST COMPARISON:  02/16/2017. FINDINGS: There are dilated fluid and air-filled loops of small intestine suggesting small bowel obstruction. No free air. Moderate amount of fecal matter in the colon. Vascular calcification is seen. There has been previous cholecystectomy. Calcifications associated with both kidneys as shown by CT. Partial compression fractures in the lumbar spine, possibly progressive since CT scan of September 2018. One-view chest shows cardiomegaly and aortic atherosclerosis. Patchy pulmonary scarring at the lung bases is a chronic finding. No sign of acute infiltrate, collapse or effusion. No  free air. IMPRESSION: Findings suggesting small bowel obstruction.  No free air. Cardiomegaly and aortic atherosclerosis.  Pulmonary scarring. Electronically Signed   By: Nelson Chimes M.D.   On: 12/05/2017 13:47      Subjective: Pt had a much better night, rested well, eating much better and having bowel movements  Discharge Exam: Vitals:   12/09/17 1508 12/10/17 0648  BP: (!) 132/98 136/65  Pulse: 73 70  Resp: 16   Temp: 98.3 F (36.8 C) 97.8 F (36.6 C)  SpO2: 97% 94%   Vitals:   12/08/17 2032 12/08/17 2127 12/09/17 1508 12/10/17 0648  BP:  (!) 173/99 (!) 132/98 136/65  Pulse:  (!) 113 73 70  Resp:   16   Temp:   98.3 F (36.8 C) 97.8 F (36.6 C)  TempSrc:   Oral Oral  SpO2: 94%  97% 94%  Weight:      Height:       General exam: Pt confused at times, cooperative, pleasant, elderly man appears younger than stated age. NAD. Respiratory system: Clear. No increased work of breathing. Cardiovascular system: S1 & S2 heard. No JVD, murmurs, gallops, clicks or pedal edema. Gastrointestinal system: Abdomen is nondistended, soft and nontender. Normal bowel sounds heard. Central nervous system: Alert and oriented. No focal neurological  deficits. Extremities: no CCE.   The results of significant diagnostics from this hospitalization (including imaging, microbiology, ancillary and laboratory) are listed below for reference.     Microbiology: Recent Results (from the past 240 hour(s))  Urine culture     Status: Abnormal (Preliminary result)   Collection Time: 12/05/17  1:43 PM  Result Value Ref Range Status   Specimen Description   Final    URINE, CATHETERIZED Performed at [facility], [address] [place], [place] [address]    Special Requests   Final    Immunocompromised Performed at [facility], [address], [place], [place] [address]    Culture (A)  Final    >=100,000 COLONIES/mL STAPHYLOCOCCUS AUREUS SUSCEPTIBILITIES TO FOLLOW Performed at [facility] [address], [place],  [address]    Report Status PENDING  Incomplete     Labs: BNP (last 3 results) No results for input(s): BNP in the last 8760 hours. Basic Metabolic Panel: Recent Labs  Lab 12/06/17 0502 12/07/17 0419 12/08/17 0413 12/09/17 0430 12/10/17 0535  NA 145 143 141 140 138  K 4.0 3.6 3.2* 3.2* 3.6  CL 109 108 108 107 108  CO2 28 26 24 26 23   GLUCOSE 100* 98 84 88 89  BUN 21 19 17 18 15   CREATININE 0.83 0.79 0.60* 0.66 0.66  CALCIUM 8.7* 8.8* 8.6* 8.4* 8.4*  MG  --  1.9  --  1.9  --    Liver Function Tests: Recent Labs  Lab 12/05/17 1003  AST 19  ALT 17  ALKPHOS 73  BILITOT 1.3*  PROT 7.1  ALBUMIN 3.8   Recent Labs  Lab 12/05/17 1003  LIPASE 32   No results for input(s): AMMONIA in the last 168 hours. CBC: Recent Labs  Lab 12/05/17 1003 12/06/17 0502 12/07/17 0419 12/08/17 0413 12/09/17 0430  WBC 8.6 5.3 6.0 5.5 4.9  NEUTROABS 6.7  --  4.4 4.0  --   HGB 12.9* 11.2* 11.6* 12.2* 11.7*  HCT 39.5 35.6* 36.8* 37.5* 35.8*  MCV 93.4 95.4 95.3 94.0 93.7  PLT 189 151 153 141* 154   Cardiac Enzymes: Recent Labs  Lab 12/05/17 1003  TROPONINI <0.03  BNP: Invalid input(s):  POCBNP CBG: No results for input(s): GLUCAP in the last 168 hours. D-Dimer No results for input(s): DDIMER in the last 72 hours. Hgb A1c No results for input(s): HGBA1C in the last 72 hours. Lipid Profile No results for input(s): CHOL, HDL, LDLCALC, TRIG, CHOLHDL, LDLDIRECT in the last 72 hours. Thyroid function studies No results for input(s): TSH, T4TOTAL, T3FREE, THYROIDAB in the last 72 hours.  Invalid input(s): FREET3 Anemia work up No results for input(s): VITAMINB12, FOLATE, FERRITIN, TIBC, IRON, RETICCTPCT in the last 72 hours. Urinalysis    Component Value Date/Time   COLORURINE YELLOW 12/05/2017 1330   APPEARANCEUR HAZY (A) 12/05/2017 1330   LABSPEC >1.030 (H) 12/05/2017 1330   PHURINE 6.0 12/05/2017 1330   GLUCOSEU NEGATIVE 12/05/2017 1330   HGBUR MODERATE (A) 12/05/2017 1330   BILIRUBINUR NEGATIVE 12/05/2017 1330   BILIRUBINUR ++ 10/26/2016 1121   KETONESUR NEGATIVE 12/05/2017 1330   PROTEINUR 100 (A) 12/05/2017 1330   UROBILINOGEN 0.2 05/11/2016 1117   NITRITE POSITIVE (A) 12/05/2017 1330   LEUKOCYTESUR MODERATE (A) 12/05/2017 1330   Sepsis Labs Invalid input(s): PROCALCITONIN,  WBC,  LACTICIDVEN Microbiology Recent Results (from the past 240 hour(s))  Urine culture     Status: Abnormal (Preliminary result)   Collection Time: 12/05/17  1:43 PM  Result Value Ref Range Status   Specimen Description   Final    URINE, CATHETERIZED Performed at [facility], [address] [place], [place] [address]    Special Requests   Final    Immunocompromised Performed at [facility], [address], [place], [place] [address]    Culture (A)  Final    >=100,000 COLONIES/mL STAPHYLOCOCCUS AUREUS SUSCEPTIBILITIES TO FOLLOW Performed at [facility], [place] [address], [place], [place] [address]    Report Status PENDING  Incomplete   Time coordinating discharge: 35 minutes  SIGNED:  Irwin Brakeman, MD  [facility] 12/10/2017, 9:25 AM Pager [phone]  [phone]  If 7PM-7AM, please contact night-coverage [email or web address] Password [email or web address]

## 2017-12-10 NOTE — Care Management Important Message (Signed)
Important Message  Patient Details  Name: Ethan Faulkner MRN: [id number] Date of Birth: Jul 11, 1915   Medicare Important Message Given:  Yes    Sherald Barge, RN 12/10/2017, 9:46 AM

## 2017-12-11 ENCOUNTER — Telehealth: Payer: Self-pay | Admitting: Family Medicine

## 2017-12-11 NOTE — Telephone Encounter (Signed)
Nurse's-patient recently discharged from the hospital. Please call patient, let them know that we are aware that they were discharged from the hospital. Please schedule them to follow-up with [place] within the next 7 to 14 days. Advised the patient to bring all of their medications with him to the visit. Please inquire if they are having any acute issues currently and documented accordingly.  Patient was in the hospital for partial small bowel obstruction.  He was under the care of hospitalist and Dr. Blake Divine surgeon.  The patient was advised by hospitalist to follow-up with primary care within 7 to 14 days.  If they are following up with Dr. Constance Haw no need to follow-up here otherwise I recommend follow-up here within 7 to 14 days.

## 2017-12-13 ENCOUNTER — Other Ambulatory Visit: Payer: Self-pay | Admitting: Family Medicine

## 2017-12-13 NOTE — Telephone Encounter (Signed)
Pt already had appt scheduled for tomorrow for a hospital follow up. Does not have appt with dr bridges scheduled. Does not need anything at this time. Daughter [name] states he is doing well.

## 2017-12-14 ENCOUNTER — Encounter: Payer: Self-pay | Admitting: Family Medicine

## 2017-12-14 ENCOUNTER — Ambulatory Visit (INDEPENDENT_AMBULATORY_CARE_PROVIDER_SITE_OTHER): Payer: Medicare Other | Admitting: Family Medicine

## 2017-12-14 ENCOUNTER — Telehealth: Payer: Self-pay | Admitting: Family Medicine

## 2017-12-14 VITALS — BP 130/60 | Ht 69.0 in

## 2017-12-14 DIAGNOSIS — Z960 Presence of urogenital implants: Secondary | ICD-10-CM | POA: Diagnosis not present

## 2017-12-14 DIAGNOSIS — Z978 Presence of other specified devices: Secondary | ICD-10-CM

## 2017-12-14 DIAGNOSIS — I482 Chronic atrial fibrillation, unspecified: Secondary | ICD-10-CM

## 2017-12-14 NOTE — Telephone Encounter (Signed)
Patient was seen today.

## 2017-12-14 NOTE — Telephone Encounter (Signed)
Patient has an appointment today for a hospital follow up.  Ethan Faulkner doesn't know if she should bring him out today.  She wants to know if Dr. Nicki Reaper can call her before his appointment today at 1:00 pm.  If so, she will cancel unless Dr. Nicki Reaper wants her to bring him out.

## 2017-12-14 NOTE — Telephone Encounter (Signed)
Discussed with daughter(DPR) Dr Nicki Reaper does think it is a good idea for the patient to follow-up this week if they would like to reschedule for late Wednesday morning or Friday morning that would be fine or for mid afternoon on Wednesday or Friday-Dr Nicki Reaper does not blame them if they do not want to have [name] come out on a rainy day. Daughter stated she will discuss with siblings and call back if they would like to reschedule the appointment.

## 2017-12-14 NOTE — Progress Notes (Signed)
   Subjective:    Patient ID: Ethan Faulkner, male    DOB: 08/14/1915, 84 y.o.   MRN: [id number]  HPI Patient is here today to follow up on hospitalization from Friday a week ago. He had been vomiting and they took him to the hospital and they told them his intestines was twisted. No problems as of now,on an antibx due to a UTI.  Time was spent with the patient regarding hospital stay, results of lab work, results of CAT scan, also time spent going over gastroenterology/surgical consultation  More than likely partial small bowel obstruction related to adhesions Has been eating fairly well recently urinating with the catheter also bowel movements are going well no abdominal bloating no vomiting no pain  Review of Systems  Constitutional: Negative for diaphoresis and fatigue.  HENT: Negative for congestion and rhinorrhea.   Respiratory: Negative for cough and shortness of breath.   Cardiovascular: Negative for chest pain and leg swelling.  Gastrointestinal: Negative for abdominal pain and diarrhea.  Skin: Negative for color change and rash.  Neurological: Negative for dizziness and headaches.  Psychiatric/Behavioral: Negative for behavioral problems and confusion.   Review of systems was hindered by the fact the patient had a stroke with aphasia    Objective:   Physical Exam  Constitutional: He appears well-nourished. No distress.  HENT:  Head: Normocephalic and atraumatic.  Eyes: Right eye exhibits no discharge. Left eye exhibits no discharge.  Neck: No tracheal deviation present.  Cardiovascular: Normal rate and normal heart sounds.  No murmur heard. Pulmonary/Chest: Effort normal and breath sounds normal. No respiratory distress.  Musculoskeletal: He exhibits no edema.  Lymphadenopathy:    He has no cervical adenopathy.  Neurological: He is alert. Coordination normal.  Skin: Skin is warm and dry.  Psychiatric: He has a normal mood and affect. His behavior is normal.  Vitals  reviewed.   Patient has atrial fib rate is controlled his bottom has a reddened area from sitting but no breakdown in the skin we talked about proper cushioning  Patient has her probable renal cell cancer but this was deemed not to be treated by family which I agree with    Atrial fib under good control Assessment & Plan:  UTI-treated with doxycycline Has indwelling catheter I do not recommend further antibiotics currently Partial small bowel obstruction resolved Warning signs were discussed in detail Atrial fibrillation rate is controlled No bleeding issues

## 2017-12-14 NOTE — Telephone Encounter (Signed)
I do think it is a good idea for the patient to follow-up this week if they would like to reschedule for late Wednesday morning or Friday morning that would be fine or for mid afternoon on Wednesday or Friday-I do not blame them if they do not want to have Bill come out on a rainy day

## 2017-12-14 NOTE — Patient Instructions (Signed)
May add colace one daily  Call if problems

## 2017-12-15 ENCOUNTER — Telehealth: Payer: Self-pay | Admitting: Family Medicine

## 2017-12-15 DIAGNOSIS — B9561 Methicillin susceptible Staphylococcus aureus infection as the cause of diseases classified elsewhere: Secondary | ICD-10-CM | POA: Diagnosis not present

## 2017-12-15 DIAGNOSIS — Z8546 Personal history of malignant neoplasm of prostate: Secondary | ICD-10-CM | POA: Diagnosis not present

## 2017-12-15 DIAGNOSIS — Z87891 Personal history of nicotine dependence: Secondary | ICD-10-CM | POA: Diagnosis not present

## 2017-12-15 DIAGNOSIS — I10 Essential (primary) hypertension: Secondary | ICD-10-CM | POA: Diagnosis not present

## 2017-12-15 DIAGNOSIS — G43909 Migraine, unspecified, not intractable, without status migrainosus: Secondary | ICD-10-CM | POA: Diagnosis not present

## 2017-12-15 DIAGNOSIS — E039 Hypothyroidism, unspecified: Secondary | ICD-10-CM | POA: Diagnosis not present

## 2017-12-15 DIAGNOSIS — T83511D Infection and inflammatory reaction due to indwelling urethral catheter, subsequent encounter: Secondary | ICD-10-CM | POA: Diagnosis not present

## 2017-12-15 DIAGNOSIS — Z96642 Presence of left artificial hip joint: Secondary | ICD-10-CM | POA: Diagnosis not present

## 2017-12-15 DIAGNOSIS — M5136 Other intervertebral disc degeneration, lumbar region: Secondary | ICD-10-CM | POA: Diagnosis not present

## 2017-12-15 DIAGNOSIS — I69320 Aphasia following cerebral infarction: Secondary | ICD-10-CM | POA: Diagnosis not present

## 2017-12-15 DIAGNOSIS — I4891 Unspecified atrial fibrillation: Secondary | ICD-10-CM | POA: Diagnosis not present

## 2017-12-15 DIAGNOSIS — Z7901 Long term (current) use of anticoagulants: Secondary | ICD-10-CM | POA: Diagnosis not present

## 2017-12-15 DIAGNOSIS — F05 Delirium due to known physiological condition: Secondary | ICD-10-CM | POA: Diagnosis not present

## 2017-12-15 DIAGNOSIS — D696 Thrombocytopenia, unspecified: Secondary | ICD-10-CM | POA: Diagnosis not present

## 2017-12-15 NOTE — Telephone Encounter (Signed)
Debbie PT with [facility] calling for verbal orders after doing evaluation today. Frequency would be 1 x 1 week and 2 x 3 weeks. CB# [phone].

## 2017-12-15 NOTE — Telephone Encounter (Signed)
It is fine to give verbal order regarding this

## 2017-12-16 NOTE — Telephone Encounter (Signed)
Ethan Faulkner is aware.

## 2017-12-20 DIAGNOSIS — M5136 Other intervertebral disc degeneration, lumbar region: Secondary | ICD-10-CM | POA: Diagnosis not present

## 2017-12-20 DIAGNOSIS — N390 Urinary tract infection, site not specified: Secondary | ICD-10-CM | POA: Diagnosis not present

## 2017-12-20 DIAGNOSIS — I10 Essential (primary) hypertension: Secondary | ICD-10-CM | POA: Diagnosis not present

## 2017-12-20 DIAGNOSIS — I69320 Aphasia following cerebral infarction: Secondary | ICD-10-CM | POA: Diagnosis not present

## 2017-12-20 DIAGNOSIS — F05 Delirium due to known physiological condition: Secondary | ICD-10-CM | POA: Diagnosis not present

## 2017-12-20 DIAGNOSIS — B9561 Methicillin susceptible Staphylococcus aureus infection as the cause of diseases classified elsewhere: Secondary | ICD-10-CM | POA: Diagnosis not present

## 2017-12-21 DIAGNOSIS — I10 Essential (primary) hypertension: Secondary | ICD-10-CM | POA: Diagnosis not present

## 2017-12-21 DIAGNOSIS — M5136 Other intervertebral disc degeneration, lumbar region: Secondary | ICD-10-CM | POA: Diagnosis not present

## 2017-12-21 DIAGNOSIS — T83511D Infection and inflammatory reaction due to indwelling urethral catheter, subsequent encounter: Secondary | ICD-10-CM | POA: Diagnosis not present

## 2017-12-21 DIAGNOSIS — F05 Delirium due to known physiological condition: Secondary | ICD-10-CM | POA: Diagnosis not present

## 2017-12-21 DIAGNOSIS — B9561 Methicillin susceptible Staphylococcus aureus infection as the cause of diseases classified elsewhere: Secondary | ICD-10-CM | POA: Diagnosis not present

## 2017-12-21 DIAGNOSIS — I69320 Aphasia following cerebral infarction: Secondary | ICD-10-CM | POA: Diagnosis not present

## 2017-12-23 ENCOUNTER — Other Ambulatory Visit: Payer: Self-pay

## 2017-12-23 DIAGNOSIS — I10 Essential (primary) hypertension: Secondary | ICD-10-CM | POA: Diagnosis not present

## 2017-12-23 DIAGNOSIS — F05 Delirium due to known physiological condition: Secondary | ICD-10-CM | POA: Diagnosis not present

## 2017-12-23 DIAGNOSIS — M5136 Other intervertebral disc degeneration, lumbar region: Secondary | ICD-10-CM | POA: Diagnosis not present

## 2017-12-23 DIAGNOSIS — T83511D Infection and inflammatory reaction due to indwelling urethral catheter, subsequent encounter: Secondary | ICD-10-CM | POA: Diagnosis not present

## 2017-12-23 DIAGNOSIS — B9561 Methicillin susceptible Staphylococcus aureus infection as the cause of diseases classified elsewhere: Secondary | ICD-10-CM | POA: Diagnosis not present

## 2017-12-23 DIAGNOSIS — I69320 Aphasia following cerebral infarction: Secondary | ICD-10-CM | POA: Diagnosis not present

## 2017-12-27 ENCOUNTER — Other Ambulatory Visit: Payer: Self-pay | Admitting: Family Medicine

## 2017-12-27 DIAGNOSIS — T83511D Infection and inflammatory reaction due to indwelling urethral catheter, subsequent encounter: Secondary | ICD-10-CM | POA: Diagnosis not present

## 2017-12-27 DIAGNOSIS — M5136 Other intervertebral disc degeneration, lumbar region: Secondary | ICD-10-CM | POA: Diagnosis not present

## 2017-12-27 DIAGNOSIS — I69320 Aphasia following cerebral infarction: Secondary | ICD-10-CM | POA: Diagnosis not present

## 2017-12-27 DIAGNOSIS — F05 Delirium due to known physiological condition: Secondary | ICD-10-CM | POA: Diagnosis not present

## 2017-12-27 DIAGNOSIS — B9561 Methicillin susceptible Staphylococcus aureus infection as the cause of diseases classified elsewhere: Secondary | ICD-10-CM | POA: Diagnosis not present

## 2017-12-27 DIAGNOSIS — I10 Essential (primary) hypertension: Secondary | ICD-10-CM | POA: Diagnosis not present

## 2017-12-28 ENCOUNTER — Ambulatory Visit (INDEPENDENT_AMBULATORY_CARE_PROVIDER_SITE_OTHER): Payer: Medicare Other | Admitting: Urology

## 2017-12-28 DIAGNOSIS — R310 Gross hematuria: Secondary | ICD-10-CM

## 2017-12-30 ENCOUNTER — Encounter: Payer: Self-pay | Admitting: Gastroenterology

## 2017-12-30 DIAGNOSIS — B9561 Methicillin susceptible Staphylococcus aureus infection as the cause of diseases classified elsewhere: Secondary | ICD-10-CM | POA: Diagnosis not present

## 2017-12-30 DIAGNOSIS — T83511D Infection and inflammatory reaction due to indwelling urethral catheter, subsequent encounter: Secondary | ICD-10-CM | POA: Diagnosis not present

## 2017-12-30 DIAGNOSIS — F05 Delirium due to known physiological condition: Secondary | ICD-10-CM | POA: Diagnosis not present

## 2017-12-30 DIAGNOSIS — M5136 Other intervertebral disc degeneration, lumbar region: Secondary | ICD-10-CM | POA: Diagnosis not present

## 2017-12-30 DIAGNOSIS — I69320 Aphasia following cerebral infarction: Secondary | ICD-10-CM | POA: Diagnosis not present

## 2017-12-30 DIAGNOSIS — I10 Essential (primary) hypertension: Secondary | ICD-10-CM | POA: Diagnosis not present

## 2018-01-04 DIAGNOSIS — I69320 Aphasia following cerebral infarction: Secondary | ICD-10-CM | POA: Diagnosis not present

## 2018-01-04 DIAGNOSIS — B9561 Methicillin susceptible Staphylococcus aureus infection as the cause of diseases classified elsewhere: Secondary | ICD-10-CM | POA: Diagnosis not present

## 2018-01-04 DIAGNOSIS — M5136 Other intervertebral disc degeneration, lumbar region: Secondary | ICD-10-CM | POA: Diagnosis not present

## 2018-01-04 DIAGNOSIS — F05 Delirium due to known physiological condition: Secondary | ICD-10-CM | POA: Diagnosis not present

## 2018-01-04 DIAGNOSIS — T83511D Infection and inflammatory reaction due to indwelling urethral catheter, subsequent encounter: Secondary | ICD-10-CM | POA: Diagnosis not present

## 2018-01-04 DIAGNOSIS — I10 Essential (primary) hypertension: Secondary | ICD-10-CM | POA: Diagnosis not present

## 2018-01-06 DIAGNOSIS — M5136 Other intervertebral disc degeneration, lumbar region: Secondary | ICD-10-CM | POA: Diagnosis not present

## 2018-01-06 DIAGNOSIS — I10 Essential (primary) hypertension: Secondary | ICD-10-CM | POA: Diagnosis not present

## 2018-01-06 DIAGNOSIS — B9561 Methicillin susceptible Staphylococcus aureus infection as the cause of diseases classified elsewhere: Secondary | ICD-10-CM | POA: Diagnosis not present

## 2018-01-06 DIAGNOSIS — T83511D Infection and inflammatory reaction due to indwelling urethral catheter, subsequent encounter: Secondary | ICD-10-CM | POA: Diagnosis not present

## 2018-01-06 DIAGNOSIS — F05 Delirium due to known physiological condition: Secondary | ICD-10-CM | POA: Diagnosis not present

## 2018-01-06 DIAGNOSIS — I69320 Aphasia following cerebral infarction: Secondary | ICD-10-CM | POA: Diagnosis not present

## 2018-01-10 ENCOUNTER — Other Ambulatory Visit: Payer: Self-pay | Admitting: Family Medicine

## 2018-01-17 ENCOUNTER — Ambulatory Visit (INDEPENDENT_AMBULATORY_CARE_PROVIDER_SITE_OTHER): Payer: Medicare Other | Admitting: Family Medicine

## 2018-01-17 ENCOUNTER — Encounter: Payer: Self-pay | Admitting: Family Medicine

## 2018-01-17 VITALS — Temp 97.7°F | Ht 69.0 in

## 2018-01-17 DIAGNOSIS — J019 Acute sinusitis, unspecified: Secondary | ICD-10-CM | POA: Diagnosis not present

## 2018-01-17 MED ORDER — AMOXICILLIN 500 MG PO TABS: 500.0000 mg | ORAL_TABLET | Freq: Three times a day (TID) | ORAL | 0 refills | Status: DC

## 2018-01-17 NOTE — Progress Notes (Signed)
   Subjective:    Patient ID: Ethan Faulkner, male    DOB: Dec 07, 1915, 84 y.o.   MRN: [id number]  HPI    Review of Systems     Objective:   Physical Exam        Assessment & Plan:

## 2018-01-17 NOTE — Progress Notes (Signed)
   Subjective:    Patient ID: Ethan Faulkner, male    DOB: 21-Jan-1916, 84 y.o.   MRN: [id number]  Cough  This is a new problem. The current episode started in the past 7 days. Associated symptoms include nasal congestion, rhinorrhea and a sore throat. Pertinent negatives include no chest pain, chills, ear pain, fever or wheezing.    Present over the past 5 days head congestion drainage coughing patient is 84 years old has debilitated health family concerned about the possibility of pneumonia  Review of Systems  Constitutional: Negative for activity change, chills and fever.  HENT: Positive for congestion, rhinorrhea and sore throat. Negative for ear pain.   Eyes: Negative for discharge.  Respiratory: Positive for cough. Negative for wheezing.   Cardiovascular: Negative for chest pain.  Gastrointestinal: Negative for nausea and vomiting.  Musculoskeletal: Negative for arthralgias.       Objective:   Physical Exam  Constitutional: He appears well-developed.  HENT:  Head: Normocephalic.  Mouth/Throat: Oropharynx is clear and moist. No oropharyngeal exudate.  Neck: Normal range of motion.  Cardiovascular: Normal rate, regular rhythm and normal heart sounds.  No murmur heard. Pulmonary/Chest: Effort normal and breath sounds normal. He has no wheezes.  Lymphadenopathy:    He has no cervical adenopathy.  Neurological: He exhibits normal muscle tone.  Skin: Skin is warm and dry.  Nursing note and vitals reviewed. I do not hear any pneumonia no tachypnea no crackles no fever        Assessment & Plan:  Acute rhinosinusitis Allergies unlikely Mucoid drainage from sinusitis Amoxil 3 times daily 10 days Warnings discussed

## 2018-01-25 ENCOUNTER — Ambulatory Visit (INDEPENDENT_AMBULATORY_CARE_PROVIDER_SITE_OTHER): Payer: Medicare Other | Admitting: Urology

## 2018-01-25 DIAGNOSIS — R338 Other retention of urine: Secondary | ICD-10-CM | POA: Diagnosis not present

## 2018-01-31 DIAGNOSIS — D509 Iron deficiency anemia, unspecified: Secondary | ICD-10-CM | POA: Diagnosis not present

## 2018-01-31 DIAGNOSIS — H524 Presbyopia: Secondary | ICD-10-CM | POA: Diagnosis not present

## 2018-01-31 DIAGNOSIS — H52223 Regular astigmatism, bilateral: Secondary | ICD-10-CM | POA: Diagnosis not present

## 2018-01-31 DIAGNOSIS — H25813 Combined forms of age-related cataract, bilateral: Secondary | ICD-10-CM | POA: Diagnosis not present

## 2018-01-31 DIAGNOSIS — I482 Chronic atrial fibrillation: Secondary | ICD-10-CM | POA: Diagnosis not present

## 2018-01-31 DIAGNOSIS — E039 Hypothyroidism, unspecified: Secondary | ICD-10-CM | POA: Diagnosis not present

## 2018-01-31 DIAGNOSIS — J301 Allergic rhinitis due to pollen: Secondary | ICD-10-CM | POA: Diagnosis not present

## 2018-01-31 DIAGNOSIS — E7849 Other hyperlipidemia: Secondary | ICD-10-CM | POA: Diagnosis not present

## 2018-01-31 DIAGNOSIS — H5203 Hypermetropia, bilateral: Secondary | ICD-10-CM | POA: Diagnosis not present

## 2018-02-01 LAB — LIPID PANEL
CHOLESTEROL TOTAL: 110 mg/dL (ref 100–199)
Chol/HDL Ratio: 2.6 ratio (ref 0.0–5.0)
HDL: 43 mg/dL (ref >39)
LDL Calculated: 48 mg/dL (ref 0–99)
Triglycerides: 97 mg/dL (ref 0–149)
VLDL CHOLESTEROL CAL: 19 mg/dL (ref 5–40)

## 2018-02-01 LAB — CBC WITH DIFFERENTIAL/PLATELET
BASOS: 0 %
Basophils Absolute: 0 10*3/uL (ref 0.0–0.2)
EOS (ABSOLUTE): 0.3 10*3/uL (ref 0.0–0.4)
EOS: 6 %
HEMATOCRIT: 39.3 % (ref 37.5–51.0)
Hemoglobin: 13.2 g/dL (ref 13.0–17.7)
IMMATURE GRANS (ABS): 0 10*3/uL (ref 0.0–0.1)
IMMATURE GRANULOCYTES: 0 %
LYMPHS: 23 %
Lymphocytes Absolute: 1.1 10*3/uL (ref 0.7–3.1)
MCH: 31.1 pg (ref 26.6–33.0)
MCHC: 33.6 g/dL (ref 31.5–35.7)
MCV: 93 fL (ref 79–97)
Monocytes Absolute: 0.6 10*3/uL (ref 0.1–0.9)
Monocytes: 13 %
NEUTROS PCT: 58 %
Neutrophils Absolute: 2.7 10*3/uL (ref 1.4–7.0)
PLATELETS: 188 10*3/uL (ref 150–450)
RBC: 4.25 x10E6/uL (ref 4.14–5.80)
RDW: 14.2 % (ref 12.3–15.4)
WBC: 4.7 10*3/uL (ref 3.4–10.8)

## 2018-02-01 LAB — BASIC METABOLIC PANEL
BUN/Creatinine Ratio: 16 (ref 10–24)
BUN: 16 mg/dL (ref 10–36)
CALCIUM: 8.5 mg/dL — AB (ref 8.6–10.2)
CHLORIDE: 103 mmol/L (ref 96–106)
CO2: 21 mmol/L (ref 20–29)
Creatinine, Ser: 1.01 mg/dL (ref 0.76–1.27)
GFR, EST AFRICAN AMERICAN: 71 mL/min/{1.73_m2} (ref >59)
GFR, EST NON AFRICAN AMERICAN: 61 mL/min/{1.73_m2} (ref >59)
Glucose: 93 mg/dL (ref 65–99)
POTASSIUM: 4.5 mmol/L (ref 3.5–5.2)
Sodium: 139 mmol/L (ref 134–144)

## 2018-02-01 LAB — HEPATIC FUNCTION PANEL
ALBUMIN: 3.8 g/dL (ref 3.2–4.6)
ALT: 6 IU/L (ref 0–44)
AST: 13 IU/L (ref 0–40)
Alkaline Phosphatase: 64 IU/L (ref 39–117)
Bilirubin Total: 0.6 mg/dL (ref 0.0–1.2)
Bilirubin, Direct: 0.19 mg/dL (ref 0.00–0.40)
TOTAL PROTEIN: 6.3 g/dL (ref 6.0–8.5)

## 2018-02-01 LAB — TSH: TSH: 3.83 u[IU]/mL (ref 0.450–4.500)

## 2018-02-09 ENCOUNTER — Encounter: Payer: Self-pay | Admitting: Family Medicine

## 2018-02-09 ENCOUNTER — Ambulatory Visit (INDEPENDENT_AMBULATORY_CARE_PROVIDER_SITE_OTHER): Payer: Medicare Other | Admitting: Family Medicine

## 2018-02-09 VITALS — BP 100/80 | Ht 69.0 in

## 2018-02-09 DIAGNOSIS — I482 Chronic atrial fibrillation, unspecified: Secondary | ICD-10-CM

## 2018-02-09 DIAGNOSIS — Z23 Encounter for immunization: Secondary | ICD-10-CM

## 2018-02-09 DIAGNOSIS — I10 Essential (primary) hypertension: Secondary | ICD-10-CM

## 2018-02-09 DIAGNOSIS — R4701 Aphasia: Secondary | ICD-10-CM

## 2018-02-09 MED ORDER — ZOSTER VAC RECOMB ADJUVANTED 50 MCG/0.5ML IM SUSR
0.5000 mL | Freq: Once | INTRAMUSCULAR | 1 refills | Status: AC
Start: 2018-02-09 — End: 2018-02-09

## 2018-02-09 NOTE — Patient Instructions (Signed)
Results for orders placed or performed during the hospital encounter of 12/05/17  Urine culture  Result Value Ref Range   Specimen Description      URINE, CATHETERIZED Performed at [facility], [address], [place], [place] [address]    Special Requests      Immunocompromised Performed at [facility], [address] [place], [place] [address]    Culture (A)     >=100,000 COLONIES/mL METHICILLIN RESISTANT STAPHYLOCOCCUS AUREUS   Report Status 12/10/2017 FINAL    Organism ID, Bacteria METHICILLIN RESISTANT STAPHYLOCOCCUS AUREUS (A)       Susceptibility   Methicillin resistant staphylococcus aureus - MIC*    CIPROFLOXACIN <=0.5 SENSITIVE Sensitive     GENTAMICIN <=0.5 SENSITIVE Sensitive     NITROFURANTOIN <=16 SENSITIVE Sensitive     OXACILLIN RESISTANT Resistant     TETRACYCLINE <=1 SENSITIVE Sensitive     VANCOMYCIN <=0.5 SENSITIVE Sensitive     TRIMETH/SULFA <=10 SENSITIVE Sensitive     CLINDAMYCIN <=0.25 SENSITIVE Sensitive     RIFAMPIN <=0.5 SENSITIVE Sensitive     Inducible Clindamycin NEGATIVE Sensitive     * >=100,000 COLONIES/mL METHICILLIN RESISTANT STAPHYLOCOCCUS AUREUS  CBC with Differential  Result Value Ref Range   WBC 8.6 4.0 - 10.5 K/uL   RBC 4.23 4.22 - 5.81 MIL/uL   Hemoglobin 12.9 (L) 13.0 - 17.0 g/dL   HCT 39.5 39.0 - 52.0 %   MCV 93.4 78.0 - 100.0 fL   MCH 30.5 26.0 - 34.0 pg   MCHC 32.7 30.0 - 36.0 g/dL   RDW 14.1 11.5 - 15.5 %   Platelets 189 150 - 400 K/uL   Neutrophils Relative % 78 %   Neutro Abs 6.7 1.7 - 7.7 K/uL   Lymphocytes Relative 12 %   Lymphs Abs 1.0 0.7 - 4.0 K/uL   Monocytes Relative 10 %   Monocytes Absolute 0.9 0.1 - 1.0 K/uL   Eosinophils Relative 0 %   Eosinophils Absolute 0.0 0.0 - 0.7 K/uL   Basophils Relative 0 %   Basophils Absolute 0.0 0.0 - 0.1 K/uL  Comprehensive metabolic panel  Result Value Ref Range   Sodium 139 135 - 145 mmol/L   Potassium 3.8 3.5 - 5.1 mmol/L   Chloride 104 98 - 111 mmol/L   CO2 26 22 - 32  mmol/L   Glucose, Bld 125 (H) 70 - 99 mg/dL   BUN 23 8 - 23 mg/dL   Creatinine, Ser 0.91 0.61 - 1.24 mg/dL   Calcium 9.3 8.9 - 10.3 mg/dL   Total Protein 7.1 6.5 - 8.1 g/dL   Albumin 3.8 3.5 - 5.0 g/dL   AST 19 15 - 41 U/L   ALT 17 0 - 44 U/L   Alkaline Phosphatase 73 38 - 126 U/L   Total Bilirubin 1.3 (H) 0.3 - 1.2 mg/dL   GFR calc non Af Amer >60 >60 mL/min   GFR calc Af Amer >60 >60 mL/min   Anion gap 9 5 - 15  Lipase, blood  Result Value Ref Range   Lipase 32 11 - 51 U/L  Urinalysis, Routine w reflex microscopic  Result Value Ref Range   Color, Urine YELLOW YELLOW   APPearance HAZY (A) CLEAR   Specific Gravity, Urine >1.030 (H) 1.005 - 1.030   pH 6.0 5.0 - 8.0   Glucose, UA NEGATIVE NEGATIVE mg/dL   Hgb urine dipstick MODERATE (A) NEGATIVE   Bilirubin Urine NEGATIVE NEGATIVE   Ketones, ur NEGATIVE NEGATIVE mg/dL  Protein, ur 100 (A) NEGATIVE mg/dL   Nitrite POSITIVE (A) NEGATIVE   Leukocytes, UA MODERATE (A) NEGATIVE  Troponin I  Result Value Ref Range   Troponin I <0.03 <0.03 ng/mL  Urinalysis, Microscopic (reflex)  Result Value Ref Range   RBC / HPF 0-5 0 - 5 RBC/hpf   WBC, UA 11-20 0 - 5 WBC/hpf   Bacteria, UA FEW (A) NONE SEEN   Squamous Epithelial / LPF NONE SEEN 0 - 5   Urine-Other MICROSCOPIC EXAM PERFORMED ON UNCONCENTRATED URINE   CBC  Result Value Ref Range   WBC 5.3 4.0 - 10.5 K/uL   RBC 3.73 (L) 4.22 - 5.81 MIL/uL   Hemoglobin 11.2 (L) 13.0 - 17.0 g/dL   HCT 35.6 (L) 39.0 - 52.0 %   MCV 95.4 78.0 - 100.0 fL   MCH 30.0 26.0 - 34.0 pg   MCHC 31.5 30.0 - 36.0 g/dL   RDW 14.2 11.5 - 15.5 %   Platelets 151 150 - 400 K/uL  Basic metabolic panel  Result Value Ref Range   Sodium 145 135 - 145 mmol/L   Potassium 4.0 3.5 - 5.1 mmol/L   Chloride 109 98 - 111 mmol/L   CO2 28 22 - 32 mmol/L   Glucose, Bld 100 (H) 70 - 99 mg/dL   BUN 21 8 - 23 mg/dL   Creatinine, Ser 0.83 0.61 - 1.24 mg/dL   Calcium 8.7 (L) 8.9 - 10.3 mg/dL   GFR calc non Af Amer >60  >60 mL/min   GFR calc Af Amer >60 >60 mL/min   Anion gap 8 5 - 15  Basic metabolic panel  Result Value Ref Range   Sodium 143 135 - 145 mmol/L   Potassium 3.6 3.5 - 5.1 mmol/L   Chloride 108 98 - 111 mmol/L   CO2 26 22 - 32 mmol/L   Glucose, Bld 98 70 - 99 mg/dL   BUN 19 8 - 23 mg/dL   Creatinine, Ser 0.79 0.61 - 1.24 mg/dL   Calcium 8.8 (L) 8.9 - 10.3 mg/dL   GFR calc non Af Amer >60 >60 mL/min   GFR calc Af Amer >60 >60 mL/min   Anion gap 9 5 - 15  CBC with Differential/Platelet  Result Value Ref Range   WBC 6.0 4.0 - 10.5 K/uL   RBC 3.86 (L) 4.22 - 5.81 MIL/uL   Hemoglobin 11.6 (L) 13.0 - 17.0 g/dL   HCT 36.8 (L) 39.0 - 52.0 %   MCV 95.3 78.0 - 100.0 fL   MCH 30.1 26.0 - 34.0 pg   MCHC 31.5 30.0 - 36.0 g/dL   RDW 14.0 11.5 - 15.5 %   Platelets 153 150 - 400 K/uL   Neutrophils Relative % 75 %   Neutro Abs 4.4 1.7 - 7.7 K/uL   Lymphocytes Relative 13 %   Lymphs Abs 0.8 0.7 - 4.0 K/uL   Monocytes Relative 12 %   Monocytes Absolute 0.7 0.1 - 1.0 K/uL   Eosinophils Relative 0 %   Eosinophils Absolute 0.0 0.0 - 0.7 K/uL   Basophils Relative 0 %   Basophils Absolute 0.0 0.0 - 0.1 K/uL  Magnesium  Result Value Ref Range   Magnesium 1.9 1.7 - 2.4 mg/dL  Basic metabolic panel  Result Value Ref Range   Sodium 141 135 - 145 mmol/L   Potassium 3.2 (L) 3.5 - 5.1 mmol/L   Chloride 108 98 - 111 mmol/L   CO2 24 22 - 32 mmol/L  Glucose, Bld 84 70 - 99 mg/dL   BUN 17 8 - 23 mg/dL   Creatinine, Ser 0.60 (L) 0.61 - 1.24 mg/dL   Calcium 8.6 (L) 8.9 - 10.3 mg/dL   GFR calc non Af Amer >60 >60 mL/min   GFR calc Af Amer >60 >60 mL/min   Anion gap 9 5 - 15  CBC with Differential/Platelet  Result Value Ref Range   WBC 5.5 4.0 - 10.5 K/uL   RBC 3.99 (L) 4.22 - 5.81 MIL/uL   Hemoglobin 12.2 (L) 13.0 - 17.0 g/dL   HCT 37.5 (L) 39.0 - 52.0 %   MCV 94.0 78.0 - 100.0 fL   MCH 30.6 26.0 - 34.0 pg   MCHC 32.5 30.0 - 36.0 g/dL   RDW 13.9 11.5 - 15.5 %   Platelets 141 (L) 150 - 400 K/uL    Neutrophils Relative % 72 %   Neutro Abs 4.0 1.7 - 7.7 K/uL   Lymphocytes Relative 16 %   Lymphs Abs 0.9 0.7 - 4.0 K/uL   Monocytes Relative 11 %   Monocytes Absolute 0.6 0.1 - 1.0 K/uL   Eosinophils Relative 1 %   Eosinophils Absolute 0.1 0.0 - 0.7 K/uL   Basophils Relative 0 %   Basophils Absolute 0.0 0.0 - 0.1 K/uL  Basic metabolic panel  Result Value Ref Range   Sodium 140 135 - 145 mmol/L   Potassium 3.2 (L) 3.5 - 5.1 mmol/L   Chloride 107 98 - 111 mmol/L   CO2 26 22 - 32 mmol/L   Glucose, Bld 88 70 - 99 mg/dL   BUN 18 8 - 23 mg/dL   Creatinine, Ser 0.66 0.61 - 1.24 mg/dL   Calcium 8.4 (L) 8.9 - 10.3 mg/dL   GFR calc non Af Amer >60 >60 mL/min   GFR calc Af Amer >60 >60 mL/min   Anion gap 7 5 - 15  Magnesium  Result Value Ref Range   Magnesium 1.9 1.7 - 2.4 mg/dL  CBC  Result Value Ref Range   WBC 4.9 4.0 - 10.5 K/uL   RBC 3.82 (L) 4.22 - 5.81 MIL/uL   Hemoglobin 11.7 (L) 13.0 - 17.0 g/dL   HCT 35.8 (L) 39.0 - 52.0 %   MCV 93.7 78.0 - 100.0 fL   MCH 30.6 26.0 - 34.0 pg   MCHC 32.7 30.0 - 36.0 g/dL   RDW 14.0 11.5 - 15.5 %   Platelets 154 150 - 400 K/uL  Basic metabolic panel  Result Value Ref Range   Sodium 138 135 - 145 mmol/L   Potassium 3.6 3.5 - 5.1 mmol/L   Chloride 108 98 - 111 mmol/L   CO2 23 22 - 32 mmol/L   Glucose, Bld 89 70 - 99 mg/dL   BUN 15 8 - 23 mg/dL   Creatinine, Ser 0.66 0.61 - 1.24 mg/dL   Calcium 8.4 (L) 8.9 - 10.3 mg/dL   GFR calc non Af Amer >60 >60 mL/min   GFR calc Af Amer >60 >60 mL/min   Anion gap 7 5 - 15

## 2018-02-09 NOTE — Progress Notes (Signed)
   Subjective:    Patient ID: Ethan Faulkner, male    DOB: Mar 12, 1916, 84 y.o.   MRN: [id number]  HPI Patient is here today to discuss chronic health issues.Per family pt is here today to discuss blood work from last week. This patient has had a stroke in the past He has chronic atrial fibrillation He has had hematuria issues as well as bleeding issues He is currently on a lower dose of Eliquis it is giving some benefit but not a full benefit but every time he was on the larger dose of Eliquis he had significant hematuria He has a indwelling catheter He does have aphasia issues He also has hypothyroidism and takes his medicine on a regular basis   Review of Systems  Constitutional: Negative for activity change, appetite change and fatigue.  HENT: Negative for congestion and rhinorrhea.   Respiratory: Negative for cough and shortness of breath.   Cardiovascular: Negative for chest pain and leg swelling.  Gastrointestinal: Negative for abdominal pain, nausea and vomiting.  Neurological: Negative for dizziness and headaches.  Psychiatric/Behavioral: Negative for agitation and behavioral problems.       Objective:   Physical Exam  Constitutional: He appears well-nourished. No distress.  HENT:  Head: Normocephalic and atraumatic.  Eyes: Right eye exhibits no discharge. Left eye exhibits no discharge.  Neck: No tracheal deviation present.  Cardiovascular: Normal rate and normal heart sounds.  No murmur heard. Pulmonary/Chest: Effort normal and breath sounds normal. No respiratory distress.  Musculoskeletal: He exhibits no edema.  Lymphadenopathy:    He has no cervical adenopathy.  Neurological: He is alert. Coordination normal.  Skin: Skin is warm and dry.  Psychiatric: He has a normal mood and affect. His behavior is normal.  Vitals reviewed.         Assessment & Plan:  Patient doing exceptionally well Post stroke Eliquis low-dose No bleeding issues Hypothyroidism  taking medication Lab work looks very good Follow-up again in 6 months sooner problems Flu shot today Pneumonia vaccine today Chronic atrial fibrillation heart rate reasonable

## 2018-02-22 ENCOUNTER — Ambulatory Visit (INDEPENDENT_AMBULATORY_CARE_PROVIDER_SITE_OTHER): Payer: Medicare Other | Admitting: Urology

## 2018-02-22 DIAGNOSIS — R338 Other retention of urine: Secondary | ICD-10-CM | POA: Diagnosis not present

## 2018-03-29 ENCOUNTER — Ambulatory Visit (INDEPENDENT_AMBULATORY_CARE_PROVIDER_SITE_OTHER): Payer: Medicare Other | Admitting: Urology

## 2018-03-29 DIAGNOSIS — R310 Gross hematuria: Secondary | ICD-10-CM | POA: Diagnosis not present

## 2018-04-11 ENCOUNTER — Other Ambulatory Visit: Payer: Self-pay | Admitting: Family Medicine

## 2018-04-19 ENCOUNTER — Other Ambulatory Visit: Payer: Self-pay | Admitting: Family Medicine

## 2018-04-26 ENCOUNTER — Ambulatory Visit (INDEPENDENT_AMBULATORY_CARE_PROVIDER_SITE_OTHER): Payer: Medicare Other | Admitting: Urology

## 2018-04-26 DIAGNOSIS — R310 Gross hematuria: Secondary | ICD-10-CM | POA: Diagnosis not present

## 2018-05-01 ENCOUNTER — Other Ambulatory Visit: Payer: Self-pay | Admitting: Family Medicine

## 2018-05-01 MED ORDER — AZITHROMYCIN 250 MG PO TABS: ORAL_TABLET | ORAL | 0 refills | Status: DC

## 2018-05-02 ENCOUNTER — Ambulatory Visit (INDEPENDENT_AMBULATORY_CARE_PROVIDER_SITE_OTHER): Payer: Medicare Other | Admitting: Family Medicine

## 2018-05-02 VITALS — BP 112/80 | Temp 93.0°F

## 2018-05-02 DIAGNOSIS — J019 Acute sinusitis, unspecified: Secondary | ICD-10-CM

## 2018-05-02 DIAGNOSIS — B9689 Other specified bacterial agents as the cause of diseases classified elsewhere: Secondary | ICD-10-CM

## 2018-05-02 NOTE — Progress Notes (Signed)
   Subjective:    Patient ID: Ethan Faulkner, male    DOB: 02-Mar-1916, 84 y.o.   MRN: [id number]  HPI  Patient comes in today with his daughters. They report that he had a wet cough and chest congestion. He also had a chills. Also has blood in urine, he had a cath replaced last week and is now in pain also. Per Daughter she spoke with Dr.Stoy Fenn yesterday and he gave an antibx, seems to have helped some.Patient is coughing up blood here in the office. Per daughter he had some blood in sputum a few weeks ago and it had cleared up.  When questioned specifically apparently it was phlegm with a small amount of blood in it this is not been going on recently.  Had a lot of coughing yesterday some shivering and chills I spoke with them yesterday and started the patient on Zithromax Review of Systems  Constitutional: Negative for activity change, chills and fever.  HENT: Positive for congestion and rhinorrhea. Negative for ear pain.   Eyes: Negative for discharge.  Respiratory: Positive for cough. Negative for wheezing.   Cardiovascular: Negative for chest pain.  Gastrointestinal: Negative for nausea and vomiting.  Musculoskeletal: Negative for arthralgias.       Objective:   Physical Exam  Constitutional: He appears well-developed.  HENT:  Head: Normocephalic.  Mouth/Throat: Oropharynx is clear and moist. No oropharyngeal exudate.  Neck: Normal range of motion.  Cardiovascular: Normal rate, regular rhythm and normal heart sounds.  No murmur heard. Pulmonary/Chest: Effort normal and breath sounds normal. He has no wheezes.  Lymphadenopathy:    He has no cervical adenopathy.  Neurological: He exhibits normal muscle tone.  Skin: Skin is warm and dry.  Nursing note and vitals reviewed.         Assessment & Plan:  Probable acute rhinosinusitis the lungs sound clear no sign of pneumonia finish out the Z-Pak  Indwelling catheter family states it is a little more cloudy if it does not  clear up we may need to send in a different antibiotic Warning signs for progressive infection was discussed in detail

## 2018-05-03 ENCOUNTER — Other Ambulatory Visit: Payer: Self-pay | Admitting: Family Medicine

## 2018-05-06 ENCOUNTER — Inpatient Hospital Stay (HOSPITAL_COMMUNITY)
Admission: EM | Admit: 2018-05-06 | Discharge: 2018-05-08 | DRG: 871 | Disposition: A | Discharge status: Home / Self Care | Payer: Medicare Other | Attending: Family Medicine | Admitting: Family Medicine

## 2018-05-06 ENCOUNTER — Emergency Department (HOSPITAL_COMMUNITY): Payer: Medicare Other

## 2018-05-06 ENCOUNTER — Ambulatory Visit: Payer: Medicare Other | Admitting: Family Medicine

## 2018-05-06 ENCOUNTER — Other Ambulatory Visit: Payer: Self-pay

## 2018-05-06 ENCOUNTER — Encounter (HOSPITAL_COMMUNITY): Payer: Self-pay

## 2018-05-06 DIAGNOSIS — I482 Chronic atrial fibrillation, unspecified: Secondary | ICD-10-CM | POA: Diagnosis not present

## 2018-05-06 DIAGNOSIS — I493 Ventricular premature depolarization: Secondary | ICD-10-CM | POA: Diagnosis not present

## 2018-05-06 DIAGNOSIS — I10 Essential (primary) hypertension: Secondary | ICD-10-CM

## 2018-05-06 DIAGNOSIS — D649 Anemia, unspecified: Secondary | ICD-10-CM

## 2018-05-06 DIAGNOSIS — Z79899 Other long term (current) drug therapy: Secondary | ICD-10-CM

## 2018-05-06 DIAGNOSIS — Z79890 Hormone replacement therapy (postmenopausal): Secondary | ICD-10-CM

## 2018-05-06 DIAGNOSIS — Z8546 Personal history of malignant neoplasm of prostate: Secondary | ICD-10-CM

## 2018-05-06 DIAGNOSIS — I4891 Unspecified atrial fibrillation: Secondary | ICD-10-CM | POA: Diagnosis present

## 2018-05-06 DIAGNOSIS — Z91048 Other nonmedicinal substance allergy status: Secondary | ICD-10-CM

## 2018-05-06 DIAGNOSIS — R5381 Other malaise: Secondary | ICD-10-CM

## 2018-05-06 DIAGNOSIS — A419 Sepsis, unspecified organism: Secondary | ICD-10-CM | POA: Diagnosis not present

## 2018-05-06 DIAGNOSIS — R059 Cough, unspecified: Secondary | ICD-10-CM

## 2018-05-06 DIAGNOSIS — J189 Pneumonia, unspecified organism: Secondary | ICD-10-CM | POA: Diagnosis not present

## 2018-05-06 DIAGNOSIS — I69320 Aphasia following cerebral infarction: Secondary | ICD-10-CM | POA: Diagnosis not present

## 2018-05-06 DIAGNOSIS — E871 Hypo-osmolality and hyponatremia: Secondary | ICD-10-CM | POA: Diagnosis not present

## 2018-05-06 DIAGNOSIS — Z888 Allergy status to other drugs, medicaments and biological substances status: Secondary | ICD-10-CM

## 2018-05-06 DIAGNOSIS — E039 Hypothyroidism, unspecified: Secondary | ICD-10-CM | POA: Diagnosis not present

## 2018-05-06 DIAGNOSIS — E785 Hyperlipidemia, unspecified: Secondary | ICD-10-CM

## 2018-05-06 DIAGNOSIS — R05 Cough: Secondary | ICD-10-CM

## 2018-05-06 DIAGNOSIS — R531 Weakness: Secondary | ICD-10-CM | POA: Diagnosis present

## 2018-05-06 DIAGNOSIS — D638 Anemia in other chronic diseases classified elsewhere: Secondary | ICD-10-CM | POA: Diagnosis present

## 2018-05-06 DIAGNOSIS — Z66 Do not resuscitate: Secondary | ICD-10-CM | POA: Diagnosis present

## 2018-05-06 DIAGNOSIS — Z923 Personal history of irradiation: Secondary | ICD-10-CM

## 2018-05-06 DIAGNOSIS — Z7901 Long term (current) use of anticoagulants: Secondary | ICD-10-CM

## 2018-05-06 DIAGNOSIS — Z96642 Presence of left artificial hip joint: Secondary | ICD-10-CM | POA: Diagnosis present

## 2018-05-06 LAB — URINALYSIS, ROUTINE W REFLEX MICROSCOPIC
Bilirubin Urine: NEGATIVE
GLUCOSE, UA: NEGATIVE mg/dL
Ketones, ur: NEGATIVE mg/dL
NITRITE: NEGATIVE
PH: 6 (ref 5.0–8.0)
Protein, ur: 100 mg/dL — AB
Specific Gravity, Urine: 1.024 (ref 1.005–1.030)
WBC, UA: >50 WBC/hpf — ABNORMAL HIGH (ref 0–5)

## 2018-05-06 LAB — CBC WITH DIFFERENTIAL/PLATELET
Abs Immature Granulocytes: 0.03 10*3/uL (ref 0.00–0.07)
Basophils Absolute: 0 10*3/uL (ref 0.0–0.1)
Basophils Relative: 0 %
Eosinophils Absolute: 0 10*3/uL (ref 0.0–0.5)
Eosinophils Relative: 0 %
HCT: 36.2 % — ABNORMAL LOW (ref 39.0–52.0)
Hemoglobin: 11.7 g/dL — ABNORMAL LOW (ref 13.0–17.0)
Immature Granulocytes: 1 %
Lymphocytes Relative: 11 %
Lymphs Abs: 0.6 10*3/uL — ABNORMAL LOW (ref 0.7–4.0)
MCH: 30.4 pg (ref 26.0–34.0)
MCHC: 32.3 g/dL (ref 30.0–36.0)
MCV: 94 fL (ref 80.0–100.0)
Monocytes Absolute: 0.7 10*3/uL (ref 0.1–1.0)
Monocytes Relative: 12 %
NEUTROS PCT: 76 %
Neutro Abs: 4.3 10*3/uL (ref 1.7–7.7)
PLATELETS: 182 10*3/uL (ref 150–400)
RBC: 3.85 MIL/uL — ABNORMAL LOW (ref 4.22–5.81)
RDW: 13.7 % (ref 11.5–15.5)
WBC: 5.7 10*3/uL (ref 4.0–10.5)
nRBC: 0 % (ref 0.0–0.2)

## 2018-05-06 LAB — COMPREHENSIVE METABOLIC PANEL
ALT: 14 U/L (ref 0–44)
ANION GAP: 10 (ref 5–15)
AST: 15 U/L (ref 15–41)
Albumin: 3.5 g/dL (ref 3.5–5.0)
Alkaline Phosphatase: 58 U/L (ref 38–126)
BUN: 16 mg/dL (ref 8–23)
CO2: 21 mmol/L — ABNORMAL LOW (ref 22–32)
Calcium: 8.8 mg/dL — ABNORMAL LOW (ref 8.9–10.3)
Chloride: 102 mmol/L (ref 98–111)
Creatinine, Ser: 0.89 mg/dL (ref 0.61–1.24)
GFR calc Af Amer: >60 mL/min (ref >60)
GFR calc non Af Amer: >60 mL/min (ref >60)
Glucose, Bld: 122 mg/dL — ABNORMAL HIGH (ref 70–99)
Potassium: 3.8 mmol/L (ref 3.5–5.1)
Sodium: 133 mmol/L — ABNORMAL LOW (ref 135–145)
Total Bilirubin: 1.8 mg/dL — ABNORMAL HIGH (ref 0.3–1.2)
Total Protein: 7 g/dL (ref 6.5–8.1)

## 2018-05-06 LAB — LACTIC ACID, PLASMA
Lactic Acid, Venous: 1.4 mmol/L (ref 0.5–1.9)
Lactic Acid, Venous: 1.3 mmol/L (ref 0.5–1.9)

## 2018-05-06 LAB — PROCALCITONIN: Procalcitonin: 0.17 ng/mL

## 2018-05-06 LAB — TROPONIN I: Troponin I: <0.03 ng/mL (ref <0.03)

## 2018-05-06 LAB — BRAIN NATRIURETIC PEPTIDE: B Natriuretic Peptide: 470 pg/mL — ABNORMAL HIGH (ref 0.0–100.0)

## 2018-05-06 LAB — PROTIME-INR
INR: 1.29
Prothrombin Time: 15.9 seconds — ABNORMAL HIGH (ref 11.4–15.2)

## 2018-05-06 MED ORDER — SODIUM CHLORIDE 0.9 % IV SOLN
INTRAVENOUS | Status: DC | PRN
  Administered 2018-05-06: 16:00:00 via INTRAVENOUS

## 2018-05-06 MED ORDER — PIPERACILLIN-TAZOBACTAM 3.375 G IVPB
3.3750 g | Freq: Three times a day (TID) | INTRAVENOUS | Status: DC
  Administered 2018-05-07 – 2018-05-08 (×5): 3.375 g via INTRAVENOUS
  Filled 2018-05-06 (×5): qty 50

## 2018-05-06 MED ORDER — METOPROLOL TARTRATE 5 MG/5ML IV SOLN
5.0000 mg | INTRAVENOUS | Status: DC | PRN
  Administered 2018-05-06: 5 mg via INTRAVENOUS
  Filled 2018-05-06: qty 5

## 2018-05-06 MED ORDER — ATORVASTATIN CALCIUM 10 MG PO TABS
20.0000 mg | ORAL_TABLET | Freq: Every day | ORAL | Status: DC
  Administered 2018-05-06 – 2018-05-07 (×2): 20 mg via ORAL
  Filled 2018-05-06 (×2): qty 2

## 2018-05-06 MED ORDER — LEVOTHYROXINE SODIUM 50 MCG PO TABS
25.0000 ug | ORAL_TABLET | Freq: Every day | ORAL | Status: DC
  Administered 2018-05-07 – 2018-05-08 (×2): 25 ug via ORAL
  Filled 2018-05-06 (×2): qty 1

## 2018-05-06 MED ORDER — SODIUM CHLORIDE 0.9 % IV SOLN
INTRAVENOUS | Status: DC
  Administered 2018-05-06: 18:00:00 via INTRAVENOUS

## 2018-05-06 MED ORDER — DM-GUAIFENESIN ER 30-600 MG PO TB12
1.0000 | ORAL_TABLET | Freq: Two times a day (BID) | ORAL | Status: DC
  Administered 2018-05-06 – 2018-05-08 (×4): 1 via ORAL
  Filled 2018-05-06 (×3): qty 1

## 2018-05-06 MED ORDER — ACETAMINOPHEN 325 MG PO TABS: 650.0000 mg | ORAL_TABLET | Freq: Four times a day (QID) | ORAL | Status: DC | PRN

## 2018-05-06 MED ORDER — DOCUSATE SODIUM 100 MG PO CAPS
100.0000 mg | ORAL_CAPSULE | Freq: Every day | ORAL | Status: DC
  Administered 2018-05-07 – 2018-05-08 (×2): 100 mg via ORAL
  Filled 2018-05-06 (×2): qty 1

## 2018-05-06 MED ORDER — LEVOFLOXACIN IN D5W 750 MG/150ML IV SOLN
750.0000 mg | INTRAVENOUS | Status: DC
  Administered 2018-05-06: 750 mg via INTRAVENOUS
  Filled 2018-05-06: qty 150

## 2018-05-06 MED ORDER — APIXABAN 2.5 MG PO TABS
2.5000 mg | ORAL_TABLET | Freq: Two times a day (BID) | ORAL | Status: DC
  Administered 2018-05-06 – 2018-05-08 (×4): 2.5 mg via ORAL
  Filled 2018-05-06 (×4): qty 1

## 2018-05-06 MED ORDER — LORATADINE 10 MG PO TABS
10.0000 mg | ORAL_TABLET | Freq: Every day | ORAL | Status: DC
  Administered 2018-05-06 – 2018-05-08 (×3): 10 mg via ORAL
  Filled 2018-05-06 (×3): qty 1

## 2018-05-06 MED ORDER — IPRATROPIUM-ALBUTEROL 0.5-2.5 (3) MG/3ML IN SOLN
3.0000 mL | Freq: Once | RESPIRATORY_TRACT | Status: AC
  Administered 2018-05-06: 3 mL via RESPIRATORY_TRACT
  Filled 2018-05-06: qty 3

## 2018-05-06 MED ORDER — PIPERACILLIN-TAZOBACTAM 3.375 G IVPB
3.3750 g | Freq: Once | INTRAVENOUS | Status: AC
  Administered 2018-05-06: 3.375 g via INTRAVENOUS
  Filled 2018-05-06: qty 50

## 2018-05-06 MED ORDER — METOPROLOL SUCCINATE ER 25 MG PO TB24: 25.0000 mg | ORAL_TABLET | Freq: Every day | ORAL | Status: DC

## 2018-05-06 MED ORDER — ACETAMINOPHEN 650 MG RE SUPP: 650.0000 mg | Freq: Four times a day (QID) | RECTAL | Status: DC | PRN

## 2018-05-06 NOTE — ED Provider Notes (Signed)
[facility] EMERGENCY DEPARTMENT Provider Note   CSN: [id number] Arrival date & time: 05/06/18  1334     History   Chief Complaint Chief Complaint  Patient presents with  . Cough    HPI Ethan Faulkner is a 84 y.o. male.  The history is provided by the patient, a relative and a caregiver. The history is limited by the condition of the patient (speech difficulty after stroke).  Cough     Pt was seen at 1400. Per pt's family: Pt has been coughing for the past 2 weeks. Describes the cough as productive of "brown" sputum. Has been associated with poor PO intake and concentrated urin. Pt was evaluated by his PMD, was told his "lungs had crackles," O2 Sats "were lower than usual," and he was concerned regarding needing hospital admission for possible pneumonia. Denies fevers, no CP, no abd pain, no N/V/D, no rash.      Past Medical History:  Diagnosis Date  . Afib ([place])   . Atrial fibrillation ([place])   . Cerebral infarction ([place])   . DDD (degenerative disc disease), lumbar   . Dysphagia   . Hypertension   . Hypothyroidism   . Migraine    "maybe monthly" (11/21/2015)  . Prostate cancer ([place]) ~ 1995   S/P radiation tx  . Renal mass    "slow growing something; doctors just watching it right now" (11/21/2015)  . Stroke [facility])    TIA  . Thrombocytopenia ([place])   . Thyroid disease    hypothyroid    Patient Active Problem List   Diagnosis Date Noted  . Hypokalemia 12/08/2017  . Nausea and vomiting   . Ileus ([place])   . Urinary tract infection associated with indwelling urethral catheter ([place])   . Small bowel obstruction ([place]) 12/05/2017  . Acute lower UTI 12/05/2017  . Other hyperlipidemia 11/09/2017  . Gross hematuria 04/06/2017  . History of stroke 04/06/2017  . Hematuria, unspecified 02/19/2017  . Cerebrovascular accident (CVA) due to embolism of left middle cerebral artery ([place]) 02/14/2017  . 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 TIA  (transient ischemic attack) 05/21/2016  . Leukopenia   . Thrombocytopenia ([place])   . Fall   . Acute blood loss anemia   . Femoral neck fracture ([place]) 11/20/2015  . Renal malignant tumor ([place]) 06/06/2015  . Long term current use of anticoagulant therapy 04/29/2015  . Subclinical hypothyroidism 03/01/2014  . Atrial fibrillation ([place]) 06/09/2013  . STRESS FRACTURE, FOOT 12/12/2008  . CLOSED FRACTURE OF METATARSAL BONE 12/12/2008    Past Surgical History:  Procedure Laterality Date  . COLONOSCOPY  1999  . ESOPHAGOGASTRODUODENOSCOPY    . LAPAROSCOPIC CHOLECYSTECTOMY  1980s  . PROSTATE BIOPSY  ~ 1995  . TOTAL HIP ARTHROPLASTY Left 11/22/2015   Procedure: LEFT HIP HEMIARTHROPLASTY ;  Surgeon: Leandrew Koyanagi, MD;  Location: [place];  Service: Orthopedics;  Laterality: Left;        Home Medications    Prior to Admission medications   Medication Sig Start Date End Date Taking? Authorizing Provider  acetaminophen (TYLENOL) 325 MG tablet Take 325-650 mg by mouth every 6 (six) hours as needed for mild pain or moderate pain.    [provider]  amLODipine (NORVASC) 5 MG tablet TAKE (1) TABLET BY MOUTH ONCE DAILY. 05/03/18   Mikey Kirschner, MD  amoxicillin (AMOXIL) 500 MG tablet Take 1 tablet (500 mg total) by mouth 3 (three) times daily. 01/17/18   Kathyrn Drown, MD  atorvastatin (LIPITOR) 20 MG tablet TAKE 1 TABLET BY MOUTH ONCE DAILY AT 6:00 PM. 04/19/18   Luking, Elayne Snare, MD  azithromycin (ZITHROMAX Z-PAK) 250 MG tablet Take 2 tablets (500 mg) on  Day 1,  followed by 1 tablet (250 mg) once daily on Days 2 through 5. 05/01/18   Luking, Elayne Snare, MD  cetirizine (ZYRTEC) 10 MG tablet Take 10 mg by mouth every evening.     [provider]  ELIQUIS 2.5 MG TABS tablet TAKE (1) TABLET BY MOUTH TWICE DAILY AT 9AM AND 9PM. 12/28/17   Kathyrn Drown, MD  ketoconazole (NIZORAL) 2 % cream Apply 1 application 2 (two) times daily topically. 04/12/17   Kathyrn Drown, MD  levothyroxine  (SYNTHROID, LEVOTHROID) 25 MCG tablet TAKE 1 TABLET EVERY MORNING ON AN EMPTY STOMACH FOR THYROID. 04/11/18   Mikey Kirschner, MD  metoprolol succinate (TOPROL-XL) 25 MG 24 hr tablet TAKE (1) TABLET BY MOUTH ONCE DAILY. 01/10/18   Kathyrn Drown, MD  polyethylene glycol powder (GLYCOLAX/MIRALAX) powder MIX 1 CAPFUL IN 8 OZ OF WATER AND DRINK twice weekly Patient taking differently: MIX 1 CAPFUL IN 8 OZ OF WATER AND DRINK twice weekly as needed for constipation. 02/19/17   Patteson, Arlan Organ, NP    Family History Family History  Problem Relation Age of Onset  . Stroke Sister        in her 34s  . CAD Neg Hx   . COPD Neg Hx   . Diabetes Neg Hx   . Heart disease Neg Hx     Social History Social History   Tobacco Use  . Smoking status: Former Research scientist (life sciences)  . Smokeless tobacco: Never Used  . Tobacco comment: 11/21/2015 "might have smoked 2 packs of cigarettes in my lifetime"  Substance Use Topics  . Alcohol use: Yes    Comment: 11/21/2015 "last beer I've had was in ~ 2007"  . Drug use: No     Allergies   Xanax [alprazolam] and Tape   Review of Systems Review of Systems  Unable to perform ROS: Patient nonverbal  Respiratory: Positive for cough.      Physical Exam Updated Vital Signs BP (!) 154/81 (BP Location: Right Arm)   Pulse (!) 125   Temp 98.4 F (36.9 C) (Oral)   Resp (!) 28   Ht 5\' 8"  (1.727 m)   Wt 70.3 kg   SpO2 94%   BMI 23.57 kg/m    Patient Vitals for the past 24 hrs:  BP Temp Temp src Pulse Resp SpO2 Height Weight  05/06/18 1354 (!) 154/81 98.4 F (36.9 C) Oral (!) 125 (!) 28 94 % - -  05/06/18 1352 - - - - - - 5\' 8"  (1.727 m) 70.3 kg  05/06/18 1351 - - - - - 92 % - -     Physical Exam 1405: Physical examination:  Nursing notes reviewed; Vital signs and O2 SAT reviewed;  Constitutional: Well developed, Well nourished, In no acute distress; Head:  Normocephalic, atraumatic; Eyes: EOMI, PERRL, No scleral icterus; ENMT: Mouth and pharynx normal, Mucous  membranes dry; Neck: Supple, Full range of motion, No lymphadenopathy; Cardiovascular: Irregular rate and rhythm, No gallop; Respiratory: Breath sounds coarse & equal bilaterally, No wheezes. +moist cough during exam.  Normal respiratory effort/excursion; Chest: Nontender, Movement normal; Abdomen: Soft, Nontender, Nondistended, Normal bowel sounds; Genitourinary: No CVA tenderness; Extremities: Peripheral pulses normal, No tenderness, No edema, No calf edema or asymmetry.; Neuro: Awake, alert. Speech difficulty per  baseline. No facial droop. No gross focal motor deficits in extremities.; Skin: Color normal, Warm, Dry.   ED Treatments / Results  Labs (all labs ordered are listed, but only abnormal results are displayed)   EKG EKG Interpretation  Date/Time:  Friday May 06 2018 13:53:15 EST Ventricular Rate:  118 PR Interval:    QRS Duration: 128 QT Interval:  328 QTC Calculation: 460 R Axis:   -63 Text Interpretation:  Atrial fibrillation Left bundle branch block Left axis deviation When compared with ECG of 12/05/2017 Rate faster Otherwise no significant change Confirmed by Francine Graven [phone]) on 05/06/2018 2:09:04 PM   Radiology   Procedures Procedures (including critical care time)  Medications Ordered in ED Medications - No data to display   Initial Impression / Assessment and Plan / ED Course  I have reviewed the triage vital signs and the nursing notes.  Pertinent labs & imaging results that were available during my care of the patient were reviewed by me and considered in my medical decision making (see chart for details).  MDM Reviewed: previous chart, nursing note and vitals Reviewed previous: labs and ECG Interpretation: labs, ECG and x-ray    Results for orders placed or performed during the hospital encounter of 05/06/18  Culture, blood (Routine x 2)  Result Value Ref Range   Specimen Description BLOOD RIGHT ARM    Special Requests      BOTTLES  DRAWN AEROBIC AND ANAEROBIC Blood Culture results may not be optimal due to an excessive volume of blood received in culture bottles Performed at [facility], [address] [place], [place] [address]    Culture PENDING    Report Status PENDING   Culture, blood (Routine x 2)  Result Value Ref Range   Specimen Description BLOOD LEFT ANTECUBITAL    Special Requests      BOTTLES DRAWN AEROBIC AND ANAEROBIC Blood Culture adequate volume Performed at [facility], [address] [place], [place] [address]    Culture PENDING    Report Status PENDING   Comprehensive metabolic panel  Result Value Ref Range   Sodium 133 (L) 135 - 145 mmol/L   Potassium 3.8 3.5 - 5.1 mmol/L   Chloride 102 98 - 111 mmol/L   CO2 21 (L) 22 - 32 mmol/L   Glucose, Bld 122 (H) 70 - 99 mg/dL   BUN 16 8 - 23 mg/dL   Creatinine, Ser 0.89 0.61 - 1.24 mg/dL   Calcium 8.8 (L) 8.9 - 10.3 mg/dL   Total Protein 7.0 6.5 - 8.1 g/dL   Albumin 3.5 3.5 - 5.0 g/dL   AST 15 15 - 41 U/L   ALT 14 0 - 44 U/L   Alkaline Phosphatase 58 38 - 126 U/L   Total Bilirubin 1.8 (H) 0.3 - 1.2 mg/dL   GFR calc non Af Amer >60 >60 mL/min   GFR calc Af Amer >60 >60 mL/min   Anion gap 10 5 - 15  CBC with Differential  Result Value Ref Range   WBC 5.7 4.0 - 10.5 K/uL   RBC 3.85 (L) 4.22 - 5.81 MIL/uL   Hemoglobin 11.7 (L) 13.0 - 17.0 g/dL   HCT 36.2 (L) 39.0 - 52.0 %   MCV 94.0 80.0 - 100.0 fL   MCH 30.4 26.0 - 34.0 pg   MCHC 32.3 30.0 - 36.0 g/dL   RDW 13.7 11.5 - 15.5 %   Platelets 182 150 - 400 K/uL   nRBC 0.0 0.0 - 0.2 %  Neutrophils Relative % 76 %   Neutro Abs 4.3 1.7 - 7.7 K/uL   Lymphocytes Relative 11 %   Lymphs Abs 0.6 (L) 0.7 - 4.0 K/uL   Monocytes Relative 12 %   Monocytes Absolute 0.7 0.1 - 1.0 K/uL   Eosinophils Relative 0 %   Eosinophils Absolute 0.0 0.0 - 0.5 K/uL   Basophils Relative 0 %   Basophils Absolute 0.0 0.0 - 0.1 K/uL   Immature Granulocytes 1 %   Abs Immature Granulocytes 0.03 0.00 - 0.07 K/uL    Protime-INR  Result Value Ref Range   Prothrombin Time 15.9 (H) 11.4 - 15.2 seconds   INR 1.29   Lactic acid, plasma  Result Value Ref Range   Lactic Acid, Venous 1.4 0.5 - 1.9 mmol/L  Lactic acid, plasma  Result Value Ref Range   Lactic Acid, Venous 1.3 0.5 - 1.9 mmol/L  Brain natriuretic peptide  Result Value Ref Range   B Natriuretic Peptide 470.0 (H) 0.0 - 100.0 pg/mL  Troponin I - Once  Result Value Ref Range   Troponin I <0.03 <0.03 ng/mL   Dg Chest 2 View Result Date: 05/06/2018 CLINICAL DATA:  Initial evaluation for acute cough, crackles in lung bases. EXAM: CHEST - 2 VIEW COMPARISON:  Prior radiograph from 12/05/2017. FINDINGS: Cardiomegaly, stable from previous. Mediastinal silhouette within normal limits. Aortic atherosclerosis. Lungs are hypoinflated. Diffuse bronchitic changes present. Superimposed patchy bibasilar opacities may reflect atelectasis or infiltrates. Perihilar vascular congestion without overt pulmonary edema. No pneumothorax. No acute osseous abnormality. Prominent gas-filled loop of bowel noted within the left upper quadrant subjacent to the left hemidiaphragm. IMPRESSION: 1. Shallow lung inflation with patchy bibasilar opacities, which may reflect atelectasis or infiltrates. 2. Underlying diffuse bronchitic changes. 3. Stable cardiomegaly with mild perihilar vascular congestion without overt pulmonary edema. 4. Aortic atherosclerosis. Electronically Signed   By: Jeannine Boga M.D.   On: 05/06/2018 15:12     1600:  Short neb given for O2 Sats in low 90's. No fever while in the ED. IV abx started for CAP. BNP mildly elevated, no old to compare and CXR without overt pulmonary edema. H/H per baseline. T/C returned from [facility] Dr. Marlowe Sax, case discussed, including:  HPI, pertinent PM/SHx, VS/PE, dx testing, ED course and treatment:  Agreeable to admit.     Final Clinical Impressions(s) / ED Diagnoses   Final diagnoses:  None    ED Discharge Orders     None       Francine Graven, DO 05/08/18 1936

## 2018-05-06 NOTE — ED Triage Notes (Signed)
Family reports that Ethan Faulkner has been coughing for 2 weeks. Seen by Dr Wolfgang Phoenix Monday and lungs were clear at that time. Checked today by Dr Wolfgang Phoenix today and he heard crackles in bases and advised to send to ED. Ethan Faulkner coughing up brown mucous. Daughters concerned also about his catheter and since it was changed it has been bloody

## 2018-05-06 NOTE — ED Notes (Signed)
Notified Hillary in lab that we need second set of cultures on patient.

## 2018-05-06 NOTE — Progress Notes (Signed)
Home visit  This patient was seen at his home at the request of his family  Patient is an established patient of our practice who is had respiratory illness for approximately 8 to 9 days.  Was seen this previous Monday and at that time diagnosed more with a upper respiratory illness and was placed on azithromycin for the possibility of a sinus infection.  At that time his O2 saturation was good and his lungs were clear.  Family had noted an increase amount of congestion with coughing with thick phlegm.  In addition to this the past 3 days he has not been drinking well his urine is much more concentrated.  The past couple days he has not been eating as well.  There have been sometimes during the initial illness that he was having chills but no documented high fever  Past Medical History:  Diagnosis Date  . Afib ([place])   . Atrial fibrillation ([place])   . Cerebral infarction ([place])   . DDD (degenerative disc disease), lumbar   . Dysphagia   . Hypertension   . Hypothyroidism   . Migraine    "maybe monthly" (11/21/2015)  . Prostate cancer ([place]) ~ 1995   S/P radiation tx  . Renal mass    "slow growing something; doctors just watching it right now" (11/21/2015)  . Stroke [facility])    TIA  . Thrombocytopenia ([place])   . 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 intake and concentrated urine Plus also the patient's advanced age  He is at risk of sepsis/outpatient failure of antibiotics for pneumonia/progressive illness  I advised the patient to be seen in the ER I called the ER doctor and gave update to the charge nurse They will see the patient for evaluation  Assessment and plan probable pneumonia with developing hypoxia and possible dehydration ER evaluation warranted

## 2018-05-06 NOTE — Progress Notes (Signed)
Patient's HR in 140's. Informed by central telemetry that HR was 175 but was not sustained. MD has been notified for increasing HR. Metoprolol (Lopressor) injection 5 mg Intravenous administered to patient.  Will continue to monitor patient

## 2018-05-06 NOTE — Progress Notes (Signed)
Pharmacy Antibiotic Note  Ethan Faulkner is a 84 y.o. male admitted on 05/06/2018 with sepsis.  Pharmacy has been consulted for Zosyn dosing.  Plan: Zosyn 3.375g IV q8h (4 hour infusion). Monitor labs, c/s, and patient improvement.  Height: 5\' 8"  (172.7 cm) Weight: 155 lb (70.3 kg) IBW/kg (Calculated) : 68.4  Temp (24hrs), Avg:98.3 F (36.8 C), Min:98.2 F (36.8 C), Max:98.4 F (36.9 C)  Recent Labs  Lab 05/06/18 1356 05/06/18 1357 05/06/18 1411  WBC 5.7  --   --   CREATININE 0.89  --   --   LATICACIDVEN  --  1.4 1.3    Estimated Creatinine Clearance: 43.8 mL/min (by C-G formula based on SCr of 0.89 mg/dL).    Allergies  Allergen Reactions  . Xanax [Alprazolam] Other (See Comments)    Dizzy, Sedated  . Tape Rash    PATIENT'S SKIN WILL TEAR/PLEASE EITHER USE PAPER TAPE OR COBAN WRAP!!    Antimicrobials this admission: Zosyn  12/13 >>  Levaquin 12/13 x 1 dose   Microbiology results: 12/13 BCx: pending 12/13 UCx: pending    Thank you for allowing pharmacy to be a part of this patient's care.  Ramond Craver 05/06/2018 5:38 PM

## 2018-05-06 NOTE — ED Notes (Signed)
Respiratory notified of Neb treatment.

## 2018-05-06 NOTE — H&P (Addendum)
History and Physical    Ethan Faulkner IDP:[id number] DOB: [birth year] DOA: 05/06/2018  PCP: Kathyrn Drown, MD Patient coming from: Home  Chief Complaint: Cough  HPI: Ethan Faulkner is a 84 y.o. male with medical history significant of A. fib, hypertension, hypothyroidism presenting to the hospital for evaluation of a cough.  Per note from patient's PCP, he was recently diagnosed with a upper respiratory illness and placed on a azithromycin for the possibility of a sinus infection.  At that time his oxygen saturation was good and his lungs were clear.  Patient is unable to communicate due to history of stroke.  History provided by daughters at bedside.  State patient has been having cough and congestion this past week.  He has not been eating much.  He has not had any fevers at home.  States his PCP gave him an antibiotic 4 days ago which she has been taking with no improvement.  Daughters believe that his urine looks more concentrated.  Patient has an indwelling Foley catheter since January.  He is seen by urology (Dr. Jeffie Pollock) on a monthly basis for catheter changes.  Last visit was this past Tuesday.  Daughters state that patient has not complained of any chest pain or abdominal pain.    ED Course: Afebrile, tachycardic, tachypneic, blood pressure stable, oxygen saturation 92 to 94% on room air.  No leukocytosis.  Lactic acid x2 normal.  T bili 1.8, was normal 3 months ago.  Remainder of LFTs normal.  I-STAT troponin negative.  BNP 470, no prior baseline.  Chest x-ray showing bibasilar opacities which may reflect atelectasis or infiltrates, diffuse bronchitic changes, and stable cardiomegaly with mild perihilar vascular congestion without overt pulmonary edema.  Review of Systems: As per HPI otherwise 10 point review of systems negative.  Past Medical History:  Diagnosis Date  . Afib ([place])   . Atrial fibrillation ([place])   . Cerebral infarction ([place])   . DDD (degenerative disc disease),  lumbar   . Dysphagia   . Hypertension   . Hypothyroidism   . Migraine    "maybe monthly" (11/21/2015)  . Prostate cancer ([place]) ~ 1995   S/P radiation tx  . Renal mass    "slow growing something; doctors just watching it right now" (11/21/2015)  . Stroke [facility])    TIA  . Thrombocytopenia ([place])   . Thyroid disease    hypothyroid    Past Surgical History:  Procedure Laterality Date  . COLONOSCOPY  1999  . ESOPHAGOGASTRODUODENOSCOPY    . LAPAROSCOPIC CHOLECYSTECTOMY  1980s  . PROSTATE BIOPSY  ~ 1995  . TOTAL HIP ARTHROPLASTY Left 11/22/2015   Procedure: LEFT HIP HEMIARTHROPLASTY ;  Surgeon: Leandrew Koyanagi, MD;  Location: [facility];  Service: Orthopedics;  Laterality: Left;     reports that he has quit smoking. He has never used smokeless tobacco. He reports current alcohol use. He reports that he does not use drugs.  Allergies  Allergen Reactions  . Xanax [Alprazolam] Other (See Comments)    Dizzy, Sedated  . Tape Rash    PATIENT'S SKIN WILL TEAR/PLEASE EITHER USE PAPER TAPE OR COBAN WRAP!!    Family History  Problem Relation Age of Onset  . Stroke Sister        in her 82s  . CAD Neg Hx   . COPD Neg Hx   . Diabetes Neg Hx   . Heart disease Neg Hx     Prior to Admission medications   Medication  Sig Start Date End Date Taking? Authorizing Provider  amLODipine (NORVASC) 5 MG tablet TAKE (1) TABLET BY MOUTH ONCE DAILY. 05/03/18  Yes Mikey Kirschner, MD  atorvastatin (LIPITOR) 20 MG tablet TAKE 1 TABLET BY MOUTH ONCE DAILY AT 6:00 PM. 04/19/18  Yes Luking, Elayne Snare, MD  cetirizine (ZYRTEC) 10 MG tablet Take 10 mg by mouth every evening.    Yes [provider]  docusate sodium (COLACE) 100 MG capsule Take 100 mg by mouth daily.   Yes [provider]  ELIQUIS 2.5 MG TABS tablet TAKE (1) TABLET BY MOUTH TWICE DAILY AT 9AM AND 9PM. 12/28/17  Yes Luking, Elayne Snare, MD  levothyroxine (SYNTHROID, LEVOTHROID) 25 MCG tablet TAKE 1 TABLET EVERY MORNING ON AN EMPTY STOMACH FOR  THYROID. 04/11/18  Yes Mikey Kirschner, MD  metoprolol succinate (TOPROL-XL) 25 MG 24 hr tablet TAKE (1) TABLET BY MOUTH ONCE DAILY. 01/10/18  Yes Kathyrn Drown, MD  polyethylene glycol powder (GLYCOLAX/MIRALAX) powder MIX 1 CAPFUL IN 8 OZ OF WATER AND DRINK twice weekly 02/19/17  Yes Patteson, Arlan Organ, NP  amoxicillin (AMOXIL) 500 MG tablet Take 1 tablet (500 mg total) by mouth 3 (three) times daily. Patient not taking: Reported on 05/06/2018 01/17/18   Kathyrn Drown, MD  azithromycin (ZITHROMAX Z-PAK) 250 MG tablet Take 2 tablets (500 mg) on  Day 1,  followed by 1 tablet (250 mg) once daily on Days 2 through 5. Patient not taking: Reported on 05/06/2018 05/01/18   Kathyrn Drown, MD  ketoconazole (NIZORAL) 2 % cream Apply 1 application 2 (two) times daily topically. Patient not taking: Reported on 05/06/2018 04/12/17   Kathyrn Drown, MD    Physical Exam: Vitals:   05/06/18 1611 05/06/18 1630 05/06/18 1645 05/06/18 1714  BP:  117/67  138/77  Pulse:   (!) 134 (!) 126  Resp:   (!) 26 20  Temp:    98.2 F (36.8 C)  TempSrc:    Oral  SpO2: 94%  92% 97%  Weight:      Height:        Physical Exam  Constitutional:  Frail elderly male  HENT:  Head: Normocephalic.  Eyes: Right eye exhibits no discharge. Left eye exhibits no discharge.  Neck: Neck supple.  Cardiovascular:  Tachycardic Irregularly irregular rhythm  Pulmonary/Chest: Effort normal. He has no wheezes.  Coarse breath sounds Coughing  Abdominal: Soft. Bowel sounds are normal. He exhibits no distension. There is no abdominal tenderness. There is no guarding.  Musculoskeletal:        General: No edema.  Neurological:  Awake and alert  Skin: Skin is warm and dry. He is not diaphoretic.     Labs on Admission: I have personally reviewed following labs and imaging studies  CBC: Recent Labs  Lab 05/06/18 1356  WBC 5.7  NEUTROABS 4.3  HGB 11.7*  HCT 36.2*  MCV 94.0  PLT 119   Basic Metabolic Panel: Recent  Labs  Lab 05/06/18 1356  NA 133*  K 3.8  CL 102  CO2 21*  GLUCOSE 122*  BUN 16  CREATININE 0.89  CALCIUM 8.8*   GFR: Estimated Creatinine Clearance: 43.8 mL/min (by C-G formula based on SCr of 0.89 mg/dL). Liver Function Tests: Recent Labs  Lab 05/06/18 1356  AST 15  ALT 14  ALKPHOS 58  BILITOT 1.8*  PROT 7.0  ALBUMIN 3.5   No results for input(s): LIPASE, AMYLASE in the last 168 hours. No results for input(s): AMMONIA  in the last 168 hours. Coagulation Profile: Recent Labs  Lab 05/06/18 1356  INR 1.29   Cardiac Enzymes: Recent Labs  Lab 05/06/18 1407  TROPONINI <0.03   BNP (last 3 results) No results for input(s): PROBNP in the last 8760 hours. HbA1C: No results for input(s): HGBA1C in the last 72 hours. CBG: No results for input(s): GLUCAP in the last 168 hours. Lipid Profile: No results for input(s): CHOL, HDL, LDLCALC, TRIG, CHOLHDL, LDLDIRECT in the last 72 hours. Thyroid Function Tests: No results for input(s): TSH, T4TOTAL, FREET4, T3FREE, THYROIDAB in the last 72 hours. Anemia Panel: No results for input(s): VITAMINB12, FOLATE, FERRITIN, TIBC, IRON, RETICCTPCT in the last 72 hours. Urine analysis:    Component Value Date/Time   COLORURINE AMBER (A) 05/06/2018 1411   APPEARANCEUR CLOUDY (A) 05/06/2018 1411   LABSPEC 1.024 05/06/2018 1411   PHURINE 6.0 05/06/2018 1411   GLUCOSEU NEGATIVE 05/06/2018 1411   HGBUR MODERATE (A) 05/06/2018 1411   BILIRUBINUR NEGATIVE 05/06/2018 1411   BILIRUBINUR ++ 10/26/2016 1121   KETONESUR NEGATIVE 05/06/2018 1411   PROTEINUR 100 (A) 05/06/2018 1411   UROBILINOGEN 0.2 05/11/2016 1117   NITRITE NEGATIVE 05/06/2018 1411   LEUKOCYTESUR MODERATE (A) 05/06/2018 1411    Radiological Exams on Admission: Dg Chest 2 View  Result Date: 05/06/2018 CLINICAL DATA:  Initial evaluation for acute cough, crackles in lung bases. EXAM: CHEST - 2 VIEW COMPARISON:  Prior radiograph from 12/05/2017. FINDINGS: Cardiomegaly,  stable from previous. Mediastinal silhouette within normal limits. Aortic atherosclerosis. Lungs are hypoinflated. Diffuse bronchitic changes present. Superimposed patchy bibasilar opacities may reflect atelectasis or infiltrates. Perihilar vascular congestion without overt pulmonary edema. No pneumothorax. No acute osseous abnormality. Prominent gas-filled loop of bowel noted within the left upper quadrant subjacent to the left hemidiaphragm. IMPRESSION: 1. Shallow lung inflation with patchy bibasilar opacities, which may reflect atelectasis or infiltrates. 2. Underlying diffuse bronchitic changes. 3. Stable cardiomegaly with mild perihilar vascular congestion without overt pulmonary edema. 4. Aortic atherosclerosis. Electronically Signed   By: Jeannine Boga M.D.   On: 05/06/2018 15:12    EKG: Independently reviewed.  Atrial fibrillation (heart rate 118), left bundle branch block.  Similar to prior tracing.  Assessment/Plan Principal Problem:   Sepsis ([place]) Active Problems:   Atrial fibrillation ([facility])   Hypothyroidism   Hyperlipidemia   Hyperbilirubinemia   Chronic anemia   Hyponatremia   Physical deconditioning   HTN (hypertension)   Sepsis secondary to suspected CAP Afebrile, tachycardic, tachypneic, blood pressure stable, oxygen saturation 92 to 94% on room air.  No leukocytosis.  Lactic acid x2 normal.  I-STAT troponin negative and EKG not suggestive of ACS.  BNP 470, no prior baseline.  Patient does not appear volume overloaded on exam.  Chest x-ray showing bibasilar opacities which may reflect atelectasis or infiltrates, diffuse bronchitic changes, and stable cardiomegaly with mild perihilar vascular congestion without overt pulmonary edema.  Echo done in September 2018 showing normal EF (55 to 60%).  She was recently treated on an outpatient basis with a course of azithromycin with no improvement. -IV Zosyn.  Will hold vancomycin at this time as MRSA PCR previously  negative. -Gentle IV fluid hydration -Mucinex DM for cough -Supplemental oxygen if needed -Blood culture x2 pending -There is also suspicion for possible UTI as daughters reported change in urine color.  UA, urine culture pending.  -Check procalcitonin level  Hyper bilirubinemia T bili 1.8, was normal 3 months ago.  Remainder of LFTs normal.  Patient is not complaining of  abdominal pain and abdominal exam benign. I discussed this lab finding with the daughters and recommended getting a right upper quadrant ultrasound for further evaluation.  Daughters declined stating they are aware that he has a "spot" on his liver and would not like to pursue any further work-up. -Outpatient follow-up with PCP  Chronic anemia -Stable.  Hemoglobin 11.7, at baseline.  Mild hyponatremia Likely secondary to decreased p.o. intake -Gentle IV fluid hydration -Repeat BMP in a.m.  Physical deconditioning -PT evaluation  Atrial fibrillation -CHA2DS2VASc 6. Currently in A. Fib with RVR (rate in 140s).  Blood pressure stable. Give IV Lopressor prn. Continue Eliquis.  Continue to monitor on telemetry.  Hyperlipidemia -Continue home Lipitor  Hypertension -Continue home metoprolol  Hypothyroidism TSH normal in September 2019. -Continue home Synthroid  DVT prophylaxis: Eliquis Code Status: DNR.  Discussed with daughters at bedside. Family Communication: Daughters at bedside. Disposition Plan: Anticipate discharge after clinical improvement. Consults called: None Admission status: Observation   Shela Leff MD [facility] Pager [phone]  If 7PM-7AM, please contact night-coverage [email or web address] Password [facility]  05/06/2018, 5:36 PM

## 2018-05-07 DIAGNOSIS — R5381 Other malaise: Secondary | ICD-10-CM | POA: Diagnosis not present

## 2018-05-07 DIAGNOSIS — I4891 Unspecified atrial fibrillation: Secondary | ICD-10-CM | POA: Diagnosis not present

## 2018-05-07 DIAGNOSIS — E785 Hyperlipidemia, unspecified: Secondary | ICD-10-CM

## 2018-05-07 DIAGNOSIS — Z79890 Hormone replacement therapy (postmenopausal): Secondary | ICD-10-CM | POA: Diagnosis not present

## 2018-05-07 DIAGNOSIS — Z888 Allergy status to other drugs, medicaments and biological substances status: Secondary | ICD-10-CM | POA: Diagnosis not present

## 2018-05-07 DIAGNOSIS — A419 Sepsis, unspecified organism: Secondary | ICD-10-CM | POA: Diagnosis present

## 2018-05-07 DIAGNOSIS — I493 Ventricular premature depolarization: Secondary | ICD-10-CM | POA: Diagnosis not present

## 2018-05-07 DIAGNOSIS — E871 Hypo-osmolality and hyponatremia: Secondary | ICD-10-CM | POA: Diagnosis present

## 2018-05-07 DIAGNOSIS — D649 Anemia, unspecified: Secondary | ICD-10-CM

## 2018-05-07 DIAGNOSIS — D638 Anemia in other chronic diseases classified elsewhere: Secondary | ICD-10-CM | POA: Diagnosis present

## 2018-05-07 DIAGNOSIS — Z7901 Long term (current) use of anticoagulants: Secondary | ICD-10-CM | POA: Diagnosis not present

## 2018-05-07 DIAGNOSIS — J189 Pneumonia, unspecified organism: Secondary | ICD-10-CM | POA: Diagnosis not present

## 2018-05-07 DIAGNOSIS — Z79899 Other long term (current) drug therapy: Secondary | ICD-10-CM | POA: Diagnosis not present

## 2018-05-07 DIAGNOSIS — I10 Essential (primary) hypertension: Secondary | ICD-10-CM

## 2018-05-07 DIAGNOSIS — E039 Hypothyroidism, unspecified: Secondary | ICD-10-CM

## 2018-05-07 DIAGNOSIS — Z66 Do not resuscitate: Secondary | ICD-10-CM | POA: Diagnosis present

## 2018-05-07 DIAGNOSIS — Z8546 Personal history of malignant neoplasm of prostate: Secondary | ICD-10-CM | POA: Diagnosis not present

## 2018-05-07 DIAGNOSIS — Z923 Personal history of irradiation: Secondary | ICD-10-CM | POA: Diagnosis not present

## 2018-05-07 DIAGNOSIS — Z96642 Presence of left artificial hip joint: Secondary | ICD-10-CM | POA: Diagnosis present

## 2018-05-07 DIAGNOSIS — Z91048 Other nonmedicinal substance allergy status: Secondary | ICD-10-CM | POA: Diagnosis not present

## 2018-05-07 DIAGNOSIS — I482 Chronic atrial fibrillation, unspecified: Secondary | ICD-10-CM | POA: Diagnosis present

## 2018-05-07 DIAGNOSIS — I69320 Aphasia following cerebral infarction: Secondary | ICD-10-CM | POA: Diagnosis not present

## 2018-05-07 DIAGNOSIS — R531 Weakness: Secondary | ICD-10-CM | POA: Diagnosis present

## 2018-05-07 DIAGNOSIS — R05 Cough: Secondary | ICD-10-CM | POA: Diagnosis not present

## 2018-05-07 LAB — BASIC METABOLIC PANEL
Anion gap: 7 (ref 5–15)
BUN: 15 mg/dL (ref 8–23)
CO2: 22 mmol/L (ref 22–32)
Calcium: 8.5 mg/dL — ABNORMAL LOW (ref 8.9–10.3)
Chloride: 107 mmol/L (ref 98–111)
Creatinine, Ser: 0.86 mg/dL (ref 0.61–1.24)
GFR calc non Af Amer: >60 mL/min (ref >60)
Glucose, Bld: 94 mg/dL (ref 70–99)
Potassium: 3.9 mmol/L (ref 3.5–5.1)
Sodium: 136 mmol/L (ref 135–145)

## 2018-05-07 MED ORDER — METOPROLOL TARTRATE 25 MG PO TABS
25.0000 mg | ORAL_TABLET | Freq: Two times a day (BID) | ORAL | Status: DC
  Administered 2018-05-07 – 2018-05-08 (×3): 25 mg via ORAL
  Filled 2018-05-07 (×3): qty 1

## 2018-05-07 MED ORDER — SODIUM CHLORIDE 0.9 % IV SOLN
INTRAVENOUS | Status: DC | PRN
  Administered 2018-05-07: 250 mL via INTRAVENOUS

## 2018-05-07 NOTE — Progress Notes (Signed)
Patient ambulated in room and in hallway. Patient ambulated with use of front wheel walker and gait belt. Patient tolerated ambulation well and denied any discomfort or distress.

## 2018-05-07 NOTE — Progress Notes (Addendum)
PROGRESS NOTE  Ethan Faulkner  UUV:[id number] DOB: 1916/02/11 DOA: 05/06/2018 PCP: Kathyrn Drown, MD   Brief Narrative: Ethan Faulkner is a 84 y.o. male with a history of stroke with residual aphasia, AFib, HTN, hypothyroidism, hyperlipidemia, prostate CA s/p XRT and chronic foley catheterization who presented to the ED on the advice of his PCP for dyspnea and low oxygen saturation and new crackles during a home visit. He had been treated with azithromycin for rhinosinusitis for the previous week but failed to improve and in fact seemed to be worsening with increasing cough productive of thick sputum and a few days of decreased po intake and chills. His PCP made a home visit and heard crackles which were new and found SpO2 of 90% which was decreased from prior, recommended ED evaluation. In the ED he was tachycardic and tachypneic. CXR demonstrated bibasilar opacities which could represent atelectasis, though favored to represent infiltrate due to clinical presentation. Urinalysis was suggestive of infection as well. Antibiotics were given and IV fluids started due to decreased po intake. Total bilirubin was elevated but the patient's family declined further work up for this. He was admitted and overnight developed AFib with RVR responsive to IV metoprolol. HR remains elevated from baseline and the patient appears very weak. PT evaluation has been requested.   Assessment & Plan: Principal Problem:   Sepsis ([place]) Active Problems:   Atrial fibrillation (HCC)   Hypothyroidism   Hyperlipidemia   Hyperbilirubinemia   Chronic anemia   Hyponatremia   Physical deconditioning   HTN (hypertension)  Sepsis due to CAP: Though leukocytosis, lactic acid are normal, it is possible that there was partial Tx of infection as outpatient, though with progressive symptoms and baseline frailty, this constitutes failure of outpatient management.  - Continue zosyn for now pending cultures. I agree that this is  not consistent with MRSA pneumonia and has hx of negative MRSA screen.  - Monitor blood cultures.   Chronic indwelling foley catheterization with pyuria and bacteriuria:  - Monitor urine culture.   Chronic atrial fibrillation with acute rapid ventricular response: In setting of sepsis as above.  - Continue prn IV metoprolol and continue po metoprolol tartrate, could dose TID if needed while acutely ill.  - Continue eliquis for CHA2DS2-VASc of 6.  - Continue telemetry monitoring. On my personal review today, there is sustained irregular HR, AFib with rate 140-150bpm, still having frequent PVCs as well.   Hyperbilirubinemia: Likely related to sepsis, though primary hepatic disorder not ruled out. Pt's family requests no further work up, and mild elevation does not require further management at this time.  - PCP follow up as indicated.   Presumed anemia of chronic disease: Very mild, no evidence of bleeding.  - Monitor prn  Physical deconditioning:  - PT evaluation  Mild hyponatremia: Decreased solute intake. Resolved with isotonic saline.  - Will let IV fluids expire due to elevated BNP, vascular congestion on CXR.   Hyperlipidemia: - Continue statin  Hypertension:  - Continue metoprolol, may increase dose if RVR recurs, though BP is low-normal so would need monitoring.  Hypothyroidism: TSH normal in September 2019. - Continue stable dose synthroid  DVT prophylaxis: Eliquis Code Status: DNR confirmed Family Communication: Son at bedside Disposition Plan: Continue inpatient management. Due to patient's advanced age (would turn 84 years old on Christmas Eve in 10 days) he is at significant risk of decompensation as evidenced by development of rapid ventricular response in the setting of sepsis requiring IV therapy.  He will require 2 midnights of inpatient care, and so it is reasonable and appropriate to convert his status to inpatient.  Consultants:   None  Procedures:    None  Antimicrobials:  Zosyn   Subjective: Pt aphasic but limited verbal report conveys poor sleep last night, feeling weak when getting up to restroom. Son at bedside states he continues to have a wet cough which is no better and is breathing faster than usual.  Objective: Vitals:   05/06/18 1645 05/06/18 1714 05/06/18 2123 05/07/18 0515  BP:  138/77 122/69 119/67  Pulse: (!) 134 (!) 126 (!) 107 94  Resp: (!) 26 20 20 18   Temp:  98.2 F (36.8 C) 98.3 F (36.8 C) 98.4 F (36.9 C)  TempSrc:  Oral Oral   SpO2: 92% 97% 95% 94%  Weight:      Height:        Intake/Output Summary (Last 24 hours) at 05/07/2018 1230 Last data filed at 05/07/2018 0900 Gross per 24 hour  Intake 1280.11 ml  Output 500 ml  Net 780.11 ml   Filed Weights   05/06/18 1352  Weight: 70.3 kg    Gen: Elderly male in no acute distress. Pulm: Tachypneic with bibasilar crackles L > R. No wheezing.  CV: Irreg irreg with 113 beats counted in 60 seconds. No murmur, rub, or gallop. No JVD, no significant pedal edema. GI: Abdomen soft, non-tender, non-distended, with normoactive bowel sounds. No organomegaly or masses felt. Ext: Warm, no deformities Skin: No new rashes, lesions or ulcers Neuro: Alert, appears to be oriented, though nearly nonverbal so this is unclear. Moving all extremities Psych: Normal behavior. Mood & affect appear appropriate.   Data Reviewed: I have personally reviewed following labs and imaging studies  CBC: Recent Labs  Lab 05/06/18 1356  WBC 5.7  NEUTROABS 4.3  HGB 11.7*  HCT 36.2*  MCV 94.0  PLT 591   Basic Metabolic Panel: Recent Labs  Lab 05/06/18 1356 05/07/18 0613  NA 133* 136  K 3.8 3.9  CL 102 107  CO2 21* 22  GLUCOSE 122* 94  BUN 16 15  CREATININE 0.89 0.86  CALCIUM 8.8* 8.5*   GFR: Estimated Creatinine Clearance: 45.3 mL/min (by C-G formula based on SCr of 0.86 mg/dL). Liver Function Tests: Recent Labs  Lab 05/06/18 1356  AST 15  ALT 14   ALKPHOS 58  BILITOT 1.8*  PROT 7.0  ALBUMIN 3.5   No results for input(s): LIPASE, AMYLASE in the last 168 hours. No results for input(s): AMMONIA in the last 168 hours. Coagulation Profile: Recent Labs  Lab 05/06/18 1356  INR 1.29   Cardiac Enzymes: Recent Labs  Lab 05/06/18 1407  TROPONINI <0.03   BNP (last 3 results) No results for input(s): PROBNP in the last 8760 hours. HbA1C: No results for input(s): HGBA1C in the last 72 hours. CBG: No results for input(s): GLUCAP in the last 168 hours. Lipid Profile: No results for input(s): CHOL, HDL, LDLCALC, TRIG, CHOLHDL, LDLDIRECT in the last 72 hours. Thyroid Function Tests: No results for input(s): TSH, T4TOTAL, FREET4, T3FREE, THYROIDAB in the last 72 hours. Anemia Panel: No results for input(s): VITAMINB12, FOLATE, FERRITIN, TIBC, IRON, RETICCTPCT in the last 72 hours. Urine analysis:    Component Value Date/Time   COLORURINE AMBER (A) 05/06/2018 1411   APPEARANCEUR CLOUDY (A) 05/06/2018 1411   LABSPEC 1.024 05/06/2018 1411   PHURINE 6.0 05/06/2018 1411   GLUCOSEU NEGATIVE 05/06/2018 1411   HGBUR MODERATE (A) 05/06/2018 1411  BILIRUBINUR NEGATIVE 05/06/2018 1411   BILIRUBINUR ++ 10/26/2016 1121   KETONESUR NEGATIVE 05/06/2018 1411   PROTEINUR 100 (A) 05/06/2018 1411   UROBILINOGEN 0.2 05/11/2016 1117   NITRITE NEGATIVE 05/06/2018 1411   LEUKOCYTESUR MODERATE (A) 05/06/2018 1411   Recent Results (from the past 240 hour(s))  Culture, blood (Routine x 2)     Status: None (Preliminary result)   Collection Time: 05/06/18  1:56 PM  Result Value Ref Range Status   Specimen Description BLOOD RIGHT ARM  Final   Special Requests   Final    BOTTLES DRAWN AEROBIC AND ANAEROBIC Blood Culture results may not be optimal due to an excessive volume of blood received in culture bottles   Culture   Final    NO GROWTH < 24 HOURS Performed at [facility], [address], [place], [place] [address]    Report Status PENDING   Incomplete  Culture, blood (Routine x 2)     Status: None (Preliminary result)   Collection Time: 05/06/18  2:41 PM  Result Value Ref Range Status   Specimen Description BLOOD LEFT ANTECUBITAL  Final   Special Requests   Final    BOTTLES DRAWN AEROBIC AND ANAEROBIC Blood Culture adequate volume   Culture   Final    NO GROWTH < 24 HOURS Performed at [facility], [address], [place], [place] [address]    Report Status PENDING  Incomplete      Radiology Studies: Dg Chest 2 View  Result Date: 05/06/2018 CLINICAL DATA:  Initial evaluation for acute cough, crackles in lung bases. EXAM: CHEST - 2 VIEW COMPARISON:  Prior radiograph from 12/05/2017. FINDINGS: Cardiomegaly, stable from previous. Mediastinal silhouette within normal limits. Aortic atherosclerosis. Lungs are hypoinflated. Diffuse bronchitic changes present. Superimposed patchy bibasilar opacities may reflect atelectasis or infiltrates. Perihilar vascular congestion without overt pulmonary edema. No pneumothorax. No acute osseous abnormality. Prominent gas-filled loop of bowel noted within the left upper quadrant subjacent to the left hemidiaphragm. IMPRESSION: 1. Shallow lung inflation with patchy bibasilar opacities, which may reflect atelectasis or infiltrates. 2. Underlying diffuse bronchitic changes. 3. Stable cardiomegaly with mild perihilar vascular congestion without overt pulmonary edema. 4. Aortic atherosclerosis. Electronically Signed   By: Jeannine Boga M.D.   On: 05/06/2018 15:12    Scheduled Meds: . apixaban  2.5 mg Oral BID  . atorvastatin  20 mg Oral q1800  . dextromethorphan-guaiFENesin  1 tablet Oral BID  . docusate sodium  100 mg Oral Daily  . levothyroxine  25 mcg Oral Q0600  . loratadine  10 mg Oral Daily   Continuous Infusions: . piperacillin-tazobactam (ZOSYN)  IV 3.375 g (05/07/18 0839)     LOS: 0 days   Time spent: 25 minutes.  Patrecia Pour, MD [facility] [email or web address] Password  [email or web address] 05/07/2018, 12:30 PM

## 2018-05-08 LAB — URINE CULTURE: Culture: >=100000 — AB

## 2018-05-08 LAB — PROCALCITONIN

## 2018-05-08 MED ORDER — AMOXICILLIN-POT CLAVULANATE 500-125 MG PO TABS: 1.0000 | ORAL_TABLET | Freq: Three times a day (TID) | ORAL | 0 refills | Status: DC

## 2018-05-08 NOTE — Discharge Summary (Signed)
Physician Discharge Summary  Ethan Faulkner GEZ:[id number] DOB: July 27, 1915 DOA: 05/06/2018  PCP: Kathyrn Drown, MD  Admit date: 05/06/2018 Discharge date: 05/08/2018  Admitted From: Home Disposition: Home   Recommendations for Outpatient Follow-up:  Follow up with PCP in the next week. Discharged with 5 more days of augmentin for possible pneumonia.  Home Health: None  Equipment/Devices: None new Discharge Condition: Stable CODE STATUS: DNR Diet recommendation: Heart healthy  Brief/Interim Summary: Ethan Faulkner is a 84 y.o. male with a history of stroke with residual aphasia, AFib, HTN, hypothyroidism, hyperlipidemia, prostate CA s/p XRT and chronic foley catheterization who presented to the ED on the advice of his PCP for dyspnea and low oxygen saturation and new crackles during a home visit. He had been treated with azithromycin for rhinosinusitis for the previous week but failed to improve and in fact seemed to be worsening with increasing cough productive of thick sputum and a few days of decreased po intake and chills. His PCP made a home visit and heard crackles which were new and found SpO2 of 90% which was decreased from prior, recommended ED evaluation. In the ED he was tachycardic and tachypneic. CXR demonstrated bibasilar opacities which could represent atelectasis, though favored to represent infiltrate due to clinical presentation. Urinalysis was suggestive of infection as well. Antibiotics were given and IV fluids started due to decreased po intake. Total bilirubin was elevated but the patient's family declined further work up for this. He was admitted and overnight developed AFib with RVR responsive to IV metoprolol. With continued clinical improvement he has returned to his baseline functional status regarding mobility, having walked with RN frequently during hospitalization. He continues to have a cough and some crackles on lung exam but is euvolemic, afebrile with no  leukocytosis and improved, now undetectable, procalcitonin assay. HR has stabilized and he is taking adequate po intake prior to discharge.    Discharge Diagnoses:  Principal Problem:   Sepsis ([place]) Active Problems:   Atrial fibrillation (HCC)   Hypothyroidism   Hyperlipidemia   Hyperbilirubinemia   Chronic anemia   Hyponatremia   Physical deconditioning   HTN (hypertension)  Sepsis due to CAP: Though leukocytosis, lactic acid are normal, it is possible that there was partial Tx of infection as outpatient, though with progressive symptoms and baseline frailty, this constitutes failure of outpatient management.  - Continued zosyn while admitted. Will continue with augmentin given clinical improvement. If symptoms recur, would consider speech therapy evaluation.   - Monitor final result of blood cultures, NGTD x>2 days at discharge.   Chronic indwelling foley catheterization with pyuria and bacteriuria: Nonclonal urine culture and negative blood cultures at discharge, so will not pursue target treatment.  - Urology follow up as scheduled.   Chronic atrial fibrillation with acute rapid ventricular response: In setting of sepsis as above RVR since resolved.  - Return to home dose metoprolol.  - Continue eliquis for CHA2DS2-VASc of 6.   Hyperbilirubinemia: Likely related to sepsis, though primary hepatic disorder not ruled out. Pt's family requests no further work up, and mild elevation does not require further management at this time.  - PCP follow up as indicated.   Presumed anemia of chronic disease: Very mild, no evidence of bleeding.  - Monitor prn  Physical deconditioning:  - Has gotten up with nursing staff regularly during hospitalization and is showing improvement in functional mobility. Has significant home supports in place.   Mild hyponatremia: Decreased solute intake. Resolved with isotonic saline.  Hyperlipidemia: - Continue statin  Hypertension:  - Continue  metoprolol. HR improved with treatment of underlying infection.   Hypothyroidism: TSH normal in September 2019. - Continue stable dose synthroid  Discharge Instructions Discharge Instructions    Diet - low sodium heart healthy   Complete by:  As directed    Discharge instructions   Complete by:  As directed    You were treated for pneumonia which seems to have improved. It is important that you take the antibiotic, augmentin, three times a day for 5 more days and follow up with Dr. Wolfgang Phoenix closely. This prescription was printed. If symptoms worsen, seek medical attention right away.     Allergies as of 05/08/2018      Reactions   Xanax [alprazolam] Other (See Comments)   Dizzy, Sedated   Tape Rash   PATIENT'S SKIN WILL TEAR/PLEASE EITHER USE PAPER TAPE OR COBAN WRAP!!      Medication List    STOP taking these medications   amoxicillin 500 MG tablet Commonly known as:  AMOXIL   azithromycin 250 MG tablet Commonly known as:  ZITHROMAX Z-PAK   ketoconazole 2 % cream Commonly known as:  NIZORAL     TAKE these medications   amLODipine 5 MG tablet Commonly known as:  NORVASC TAKE (1) TABLET BY MOUTH ONCE DAILY.   amoxicillin-clavulanate 500-125 MG tablet Commonly known as:  AUGMENTIN Take 1 tablet (500 mg total) by mouth 3 (three) times daily.   atorvastatin 20 MG tablet Commonly known as:  LIPITOR TAKE 1 TABLET BY MOUTH ONCE DAILY AT 6:00 PM.   cetirizine 10 MG tablet Commonly known as:  ZYRTEC Take 10 mg by mouth every evening.   docusate sodium 100 MG capsule Commonly known as:  COLACE Take 100 mg by mouth daily.   ELIQUIS 2.5 MG Tabs tablet Generic drug:  apixaban TAKE (1) TABLET BY MOUTH TWICE DAILY AT 9AM AND 9PM.   levothyroxine 25 MCG tablet Commonly known as:  SYNTHROID, LEVOTHROID TAKE 1 TABLET EVERY MORNING ON AN EMPTY STOMACH FOR THYROID.   metoprolol succinate 25 MG 24 hr tablet Commonly known as:  TOPROL-XL TAKE (1) TABLET BY MOUTH ONCE  DAILY.   polyethylene glycol powder powder Commonly known as:  GLYCOLAX/MIRALAX MIX 1 CAPFUL IN 8 OZ OF WATER AND DRINK twice weekly      Follow-up Information    Luking, Elayne Snare, MD. Schedule an appointment as soon as possible for a visit.   Specialty:  Family Medicine Contact information: [place] [address] [phone]          Allergies  Allergen Reactions  . Xanax [Alprazolam] Other (See Comments)    Dizzy, Sedated  . Tape Rash    PATIENT'S SKIN WILL TEAR/PLEASE EITHER USE PAPER TAPE OR COBAN WRAP!!    Consultations:  None  Procedures/Studies: Dg Chest 2 View  Result Date: 05/06/2018 CLINICAL DATA:  Initial evaluation for acute cough, crackles in lung bases. EXAM: CHEST - 2 VIEW COMPARISON:  Prior radiograph from 12/05/2017. FINDINGS: Cardiomegaly, stable from previous. Mediastinal silhouette within normal limits. Aortic atherosclerosis. Lungs are hypoinflated. Diffuse bronchitic changes present. Superimposed patchy bibasilar opacities may reflect atelectasis or infiltrates. Perihilar vascular congestion without overt pulmonary edema. No pneumothorax. No acute osseous abnormality. Prominent gas-filled loop of bowel noted within the left upper quadrant subjacent to the left hemidiaphragm. IMPRESSION: 1. Shallow lung inflation with patchy bibasilar opacities, which may reflect atelectasis or infiltrates. 2. Underlying diffuse bronchitic changes. 3. Stable  cardiomegaly with mild perihilar vascular congestion without overt pulmonary edema. 4. Aortic atherosclerosis. Electronically Signed   By: Jeannine Boga M.D.   On: 05/06/2018 15:12    Subjective: Aphasic but able to communicate that he feels better, ready to go home. Daughter at bedside states he has a stable cough, does not seem short of breath when getting up and walking. No fever, HR controlled for past 24 hours.  Discharge Exam: Vitals:   05/08/18 0500 05/08/18 0955  BP: 118/65  115/64  Pulse: 79 75  Resp: 18   Temp: 97.8 F (36.6 C)   SpO2: 94%    General: Pt is alert, awake, not in acute distress Cardiovascular: RRR, S1/S2 +, no rubs, no gallops Respiratory: Nonlabored, 96% on room air. Bibasilar crackles are improved, but remain.  Abdominal: Soft, NT, ND, bowel sounds + Extremities: No edema, no cyanosis  Labs: BNP (last 3 results) Recent Labs    05/06/18 1407  BNP 295.1*   Basic Metabolic Panel: Recent Labs  Lab 05/06/18 1356 05/07/18 0613  NA 133* 136  K 3.8 3.9  CL 102 107  CO2 21* 22  GLUCOSE 122* 94  BUN 16 15  CREATININE 0.89 0.86  CALCIUM 8.8* 8.5*   Liver Function Tests: Recent Labs  Lab 05/06/18 1356  AST 15  ALT 14  ALKPHOS 58  BILITOT 1.8*  PROT 7.0  ALBUMIN 3.5   No results for input(s): LIPASE, AMYLASE in the last 168 hours. No results for input(s): AMMONIA in the last 168 hours. CBC: Recent Labs  Lab 05/06/18 1356  WBC 5.7  NEUTROABS 4.3  HGB 11.7*  HCT 36.2*  MCV 94.0  PLT 182   Cardiac Enzymes: Recent Labs  Lab 05/06/18 1407  TROPONINI <0.03   BNP: Invalid input(s): POCBNP CBG: No results for input(s): GLUCAP in the last 168 hours. D-Dimer No results for input(s): DDIMER in the last 72 hours. Hgb A1c No results for input(s): HGBA1C in the last 72 hours. Lipid Profile No results for input(s): CHOL, HDL, LDLCALC, TRIG, CHOLHDL, LDLDIRECT in the last 72 hours. Thyroid function studies No results for input(s): TSH, T4TOTAL, T3FREE, THYROIDAB in the last 72 hours.  Invalid input(s): FREET3 Anemia work up No results for input(s): VITAMINB12, FOLATE, FERRITIN, TIBC, IRON, RETICCTPCT in the last 72 hours. Urinalysis    Component Value Date/Time   COLORURINE AMBER (A) 05/06/2018 1411   APPEARANCEUR CLOUDY (A) 05/06/2018 1411   LABSPEC 1.024 05/06/2018 1411   PHURINE 6.0 05/06/2018 1411   GLUCOSEU NEGATIVE 05/06/2018 1411   HGBUR MODERATE (A) 05/06/2018 1411   BILIRUBINUR NEGATIVE 05/06/2018  1411   BILIRUBINUR ++ 10/26/2016 1121   KETONESUR NEGATIVE 05/06/2018 1411   PROTEINUR 100 (A) 05/06/2018 1411   UROBILINOGEN 0.2 05/11/2016 1117   NITRITE NEGATIVE 05/06/2018 1411   LEUKOCYTESUR MODERATE (A) 05/06/2018 1411    Microbiology Recent Results (from the past 240 hour(s))  Culture, blood (Routine x 2)     Status: None (Preliminary result)   Collection Time: 05/06/18  1:56 PM  Result Value Ref Range Status   Specimen Description BLOOD RIGHT ARM  Final   Special Requests   Final    BOTTLES DRAWN AEROBIC AND ANAEROBIC Blood Culture results may not be optimal due to an excessive volume of blood received in culture bottles   Culture   Final    NO GROWTH 2 DAYS Performed at [facility], [address] [place], [place] [address]    Report Status PENDING  Incomplete  Urine culture     Status: Abnormal   Collection Time: 05/06/18  2:11 PM  Result Value Ref Range Status   Specimen Description   Final    URINE, CLEAN CATCH Performed at [facility], [address] [place], [place] [address]    Special Requests   Final    NONE Performed at [facility], [address], [place], [place] [address]    Culture (A)  Final    >=100,000 COLONIES/mL MULTIPLE SPECIES PRESENT, SUGGEST RECOLLECTION   Report Status 05/08/2018 FINAL  Final  Culture, blood (Routine x 2)     Status: None (Preliminary result)   Collection Time: 05/06/18  2:41 PM  Result Value Ref Range Status   Specimen Description BLOOD LEFT ANTECUBITAL  Final   Special Requests   Final    BOTTLES DRAWN AEROBIC AND ANAEROBIC Blood Culture adequate volume   Culture   Final    NO GROWTH 2 DAYS Performed at [facility], [address], [place], [place] [address]    Report Status PENDING  Incomplete    Time coordinating discharge: Approximately 40 minutes  Patrecia Pour, MD  [facility] 05/08/2018, 11:23 AM Pager [phone]

## 2018-05-08 NOTE — Progress Notes (Signed)
Patient's daughters given discharge instructions and antibiotic prescription. All questions answered and they stated they were filling antibiotic today. PIV x 1 removed, and patient assisted with dressing. Discharged home via wheelchair with daughters.

## 2018-05-09 ENCOUNTER — Telehealth: Payer: Self-pay | Admitting: Family Medicine

## 2018-05-09 NOTE — Telephone Encounter (Signed)
Nurse's-patient recently discharged from the hospital. Please call patient, let them know that we are aware that they were discharged from the hospital. Please schedule them to follow-up with [place] within the next 7 days. Advised the patient to bring all of their medications with him to the visit. Please inquire if they are having any acute issues currently and documented accordingly.  Nurses- given this patient's age and current situation I will do a home visit With my schedule what works is Tuesday after work I will plan to drop by his home at that time approximately 530-6 PM Please notify family Please also have the front place him on my schedule for 5 PM on Tuesday

## 2018-05-09 NOTE — Telephone Encounter (Signed)
Left message to return call 

## 2018-05-10 ENCOUNTER — Ambulatory Visit: Payer: Medicare Other | Admitting: Family Medicine

## 2018-05-10 NOTE — Telephone Encounter (Signed)
Daughter sherri notified. She states today would be great. Pt was already added to the schedule.

## 2018-05-11 ENCOUNTER — Telehealth: Payer: Self-pay | Admitting: Family Medicine

## 2018-05-11 LAB — CULTURE, BLOOD (ROUTINE X 2)
Culture: NO GROWTH
Culture: NO GROWTH
Special Requests: ADEQUATE

## 2018-05-11 NOTE — Telephone Encounter (Signed)
I noticed that the wrong patient had been scheduled for this home visit so I have fixed everything, deleted the (no charge) charge encounter and scheduled the correct patient so that you can document your home visit on 12/17 and enter charges for Ethan Faulkner.

## 2018-05-12 ENCOUNTER — Telehealth: Payer: Self-pay | Admitting: Family Medicine

## 2018-05-12 NOTE — Telephone Encounter (Signed)
Nice job! You anticipated the issue very nicely thank you

## 2018-05-12 NOTE — Telephone Encounter (Signed)
FYI-just in case you get a phone call Patient recently in the hospital with pneumonia.  Doing better.  I did a home visit earlier this week. He is finishing up Augmentin-had some diarrhea but not severe. I told the family if the diarrhea gets out of hand we can change antibiotics You should not have to see him here in the office this week unless there is any type of severe issues Thanks

## 2018-05-13 ENCOUNTER — Telehealth: Payer: Self-pay | Admitting: Family Medicine

## 2018-05-13 MED ORDER — POLYETHYLENE GLYCOL 3350 17 GM/SCOOP PO POWD: ORAL | 3 refills | Status: DC

## 2018-05-13 NOTE — Telephone Encounter (Signed)
Yes plus three ref

## 2018-05-13 NOTE — Telephone Encounter (Signed)
Requesting refill for polyethylene glycol powder (GLYCOLAX/MIRALAX) powder    Pharmacy:  [facility], [place]

## 2018-05-13 NOTE — Telephone Encounter (Signed)
Medication sent in and daughter is aware.

## 2018-05-13 NOTE — Telephone Encounter (Signed)
Last filled by Balinda Quails. Please advise. Thank you

## 2018-05-16 ENCOUNTER — Ambulatory Visit: Payer: Medicare Other | Admitting: Family Medicine

## 2018-05-23 ENCOUNTER — Other Ambulatory Visit: Payer: Self-pay | Admitting: Family Medicine

## 2018-06-03 ENCOUNTER — Ambulatory Visit (INDEPENDENT_AMBULATORY_CARE_PROVIDER_SITE_OTHER): Payer: Medicare Other | Admitting: Urology

## 2018-06-03 DIAGNOSIS — B3749 Other urogenital candidiasis: Secondary | ICD-10-CM | POA: Diagnosis not present

## 2018-06-03 DIAGNOSIS — R338 Other retention of urine: Secondary | ICD-10-CM

## 2018-06-08 ENCOUNTER — Other Ambulatory Visit: Payer: Self-pay | Admitting: Family Medicine

## 2018-06-20 ENCOUNTER — Telehealth: Payer: Self-pay | Admitting: Family Medicine

## 2018-06-20 NOTE — Telephone Encounter (Signed)
Please advise 

## 2018-06-20 NOTE — Telephone Encounter (Signed)
I would not recommend Zyrtec because there is a approximately 30% chance that can cause drowsiness Most older people who have a runny nose it is caused by vasomotor rhinorrhea and allergy tablets really do not help that much Atrovent nasal spray can be beneficial to try If they are interested we can send this to the pharmacy

## 2018-06-20 NOTE — Telephone Encounter (Signed)
Pt's daughter [name] (on [place]) calling to see if Dr. Nicki Reaper would recommend an allergy medication for pts nasal drip. Currently he is taking a generic Claritin. [name] would like to switch him to something name brand like zyrtec but would like to make sure that is ok with Dr. Nicki Reaper.  CB# [phone].

## 2018-06-20 NOTE — Telephone Encounter (Signed)
Attempted to contact [place]. Voicemail is full;unable to leave message.

## 2018-06-21 NOTE — Telephone Encounter (Signed)
Daughter [name]([facility]) notified Dr Nicki Reaper  would not recommend Zyrtec because there is a approximately 30% chance that can cause drowsiness Most older people who have a runny nose it is caused by vasomotor rhinorrhea and allergy tablets really do not help that much Atrovent nasal spray can be beneficial to try If they are interested we can send this to the pharmacy Daughter verbalized understanding and stated she will check with her sister and call back if they want the nasal spray sent in.

## 2018-06-28 ENCOUNTER — Ambulatory Visit (INDEPENDENT_AMBULATORY_CARE_PROVIDER_SITE_OTHER): Payer: Medicare Other | Admitting: Urology

## 2018-06-28 DIAGNOSIS — R338 Other retention of urine: Secondary | ICD-10-CM | POA: Diagnosis not present

## 2018-07-05 ENCOUNTER — Other Ambulatory Visit: Payer: Self-pay | Admitting: Family Medicine

## 2018-07-20 ENCOUNTER — Other Ambulatory Visit: Payer: Self-pay | Admitting: Family Medicine

## 2018-07-26 ENCOUNTER — Ambulatory Visit (INDEPENDENT_AMBULATORY_CARE_PROVIDER_SITE_OTHER): Payer: Medicare Other | Admitting: Urology

## 2018-07-26 DIAGNOSIS — R338 Other retention of urine: Secondary | ICD-10-CM

## 2018-08-05 ENCOUNTER — Other Ambulatory Visit: Payer: Self-pay | Admitting: Family Medicine

## 2018-08-09 IMAGING — DX DG CHEST 1V PORT
1 series · 1 of 1 positions shown · non-contrast
Comparison: 06/02/2016

CLINICAL DATA: CHF

EXAM:
PORTABLE CHEST 1 VIEW

[chest ap]
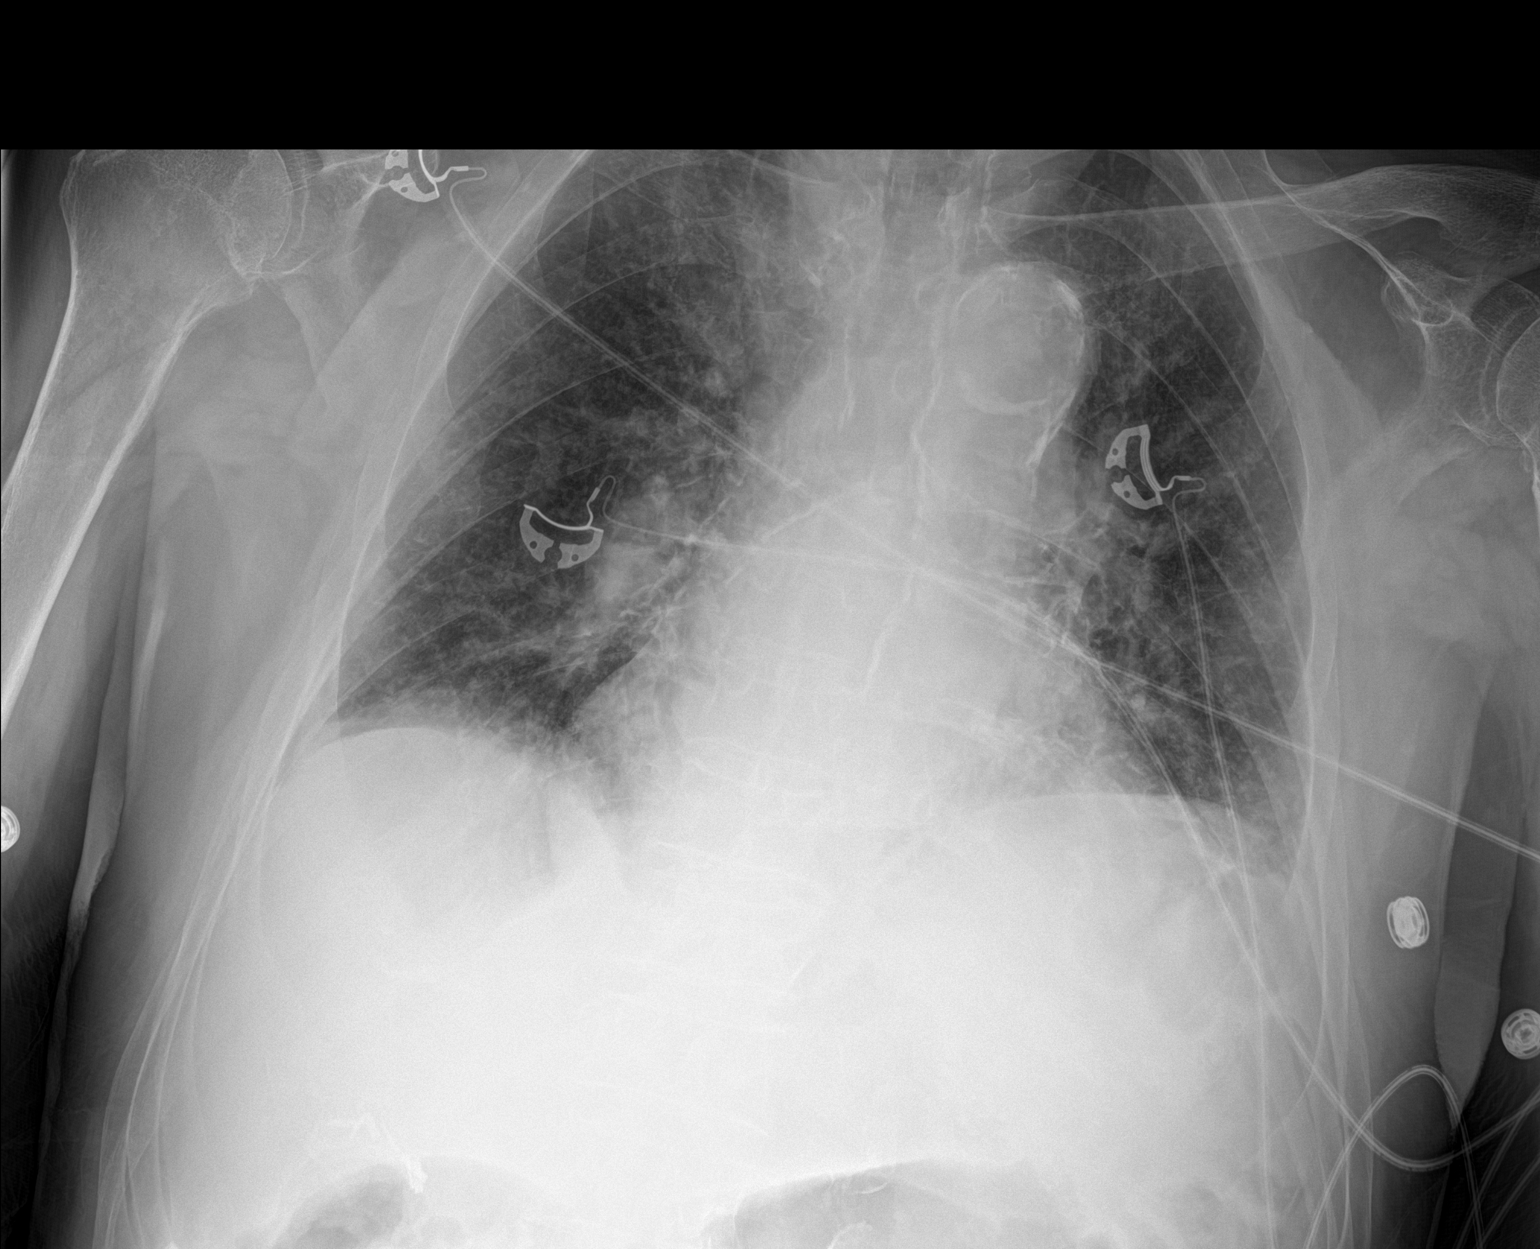

[1 of 1 positions shown; findings below may reference images not displayed]

FINDINGS: Borderline cardiomegaly, accentuated by technique. Stable appearance
of the hila. Low volume chest with interstitial crowding. Suspect
streaky atelectasis. No pneumothorax or suspected edema.
IMPRESSION: 1. Negative for pulmonary edema.
2. Low volume chest with mild atelectasis.

## 2018-08-22 ENCOUNTER — Other Ambulatory Visit: Payer: Self-pay | Admitting: Family Medicine

## 2018-09-13 ENCOUNTER — Ambulatory Visit (INDEPENDENT_AMBULATORY_CARE_PROVIDER_SITE_OTHER): Payer: Medicare Other | Admitting: Family Medicine

## 2018-09-13 ENCOUNTER — Telehealth: Payer: Self-pay | Admitting: Family Medicine

## 2018-09-13 ENCOUNTER — Other Ambulatory Visit: Payer: Self-pay

## 2018-09-13 DIAGNOSIS — J22 Unspecified acute lower respiratory infection: Secondary | ICD-10-CM | POA: Diagnosis not present

## 2018-09-13 MED ORDER — AZITHROMYCIN 250 MG PO TABS: ORAL_TABLET | ORAL | 0 refills | Status: DC

## 2018-09-13 NOTE — Telephone Encounter (Signed)
Patient is having a cough Please put on the schedule for myself He would be fine to list this as 4 PM and as arrived (The patient has a aphasia and will be not possible to talk with him I will be talking with his daughter [name]) This is already been arranged I will be talking with her on later around 6 PM

## 2018-09-13 NOTE — Telephone Encounter (Signed)
Yes sir. Pt has been placed on schedule and arrived.

## 2018-09-14 NOTE — Progress Notes (Signed)
Virtual Visit via Video Note Video and audio patient at home I was present at the office I connected with Darl Pikes on 09/14/18 at  4:10 PM EDT by a video enabled telemedicine application and verified that I am speaking with the correct person using two identifiers.   I discussed the limitations of evaluation and management by telemedicine and the availability of in person appointments. The patient expressed understanding and agreed to proceed.  History of Present Illness:    Observations/Objective:   Assessment and Plan: It is felt the patient has more than likely lower lung infection recommend Zithromax next 5 days warning signs discussed follow-up if progressive troubles or worse  Follow Up Instructions:    I discussed the assessment and treatment plan with the patient. The patient was provided an opportunity to ask questions and all were answered. The patient agreed with the plan and demonstrated an understanding of the instructions.   The patient was advised to call back or seek an in-person evaluation if the symptoms worsen or if the condition fails to improve as anticipated.  I provided 17 minutes of non-face-to-face time during this encounter.   Sallee Lange, MD  Based upon his symptomatology I doubt coronavirus but education was given to the family verbally

## 2018-09-20 ENCOUNTER — Ambulatory Visit (INDEPENDENT_AMBULATORY_CARE_PROVIDER_SITE_OTHER): Payer: Medicare Other | Admitting: Urology

## 2018-09-20 DIAGNOSIS — B3749 Other urogenital candidiasis: Secondary | ICD-10-CM

## 2018-10-19 ENCOUNTER — Other Ambulatory Visit: Payer: Self-pay | Admitting: Family Medicine

## 2018-10-19 NOTE — Telephone Encounter (Signed)
May grant each with 2 refills Needs virtual visit in June

## 2018-10-21 ENCOUNTER — Ambulatory Visit (INDEPENDENT_AMBULATORY_CARE_PROVIDER_SITE_OTHER): Payer: Medicare Other | Admitting: Urology

## 2018-10-21 DIAGNOSIS — R338 Other retention of urine: Secondary | ICD-10-CM | POA: Diagnosis not present

## 2018-11-07 ENCOUNTER — Other Ambulatory Visit: Payer: Self-pay | Admitting: Family Medicine

## 2018-11-07 NOTE — Telephone Encounter (Signed)
May have this +1 additional refill will need to do virtual visit in July

## 2018-11-29 ENCOUNTER — Other Ambulatory Visit: Payer: Self-pay | Admitting: Family Medicine

## 2018-11-29 ENCOUNTER — Ambulatory Visit (INDEPENDENT_AMBULATORY_CARE_PROVIDER_SITE_OTHER): Payer: Medicare Other | Admitting: Urology

## 2018-11-29 DIAGNOSIS — R338 Other retention of urine: Secondary | ICD-10-CM | POA: Diagnosis not present

## 2018-11-29 NOTE — Telephone Encounter (Signed)
Please schedule virtual visit within the next 30 days May have 1 refill

## 2018-12-02 NOTE — Telephone Encounter (Signed)
Lvm with Ethan Faulkner (daughter) to schedule virtual visit

## 2018-12-06 ENCOUNTER — Other Ambulatory Visit: Payer: Self-pay | Admitting: Family Medicine

## 2018-12-14 ENCOUNTER — Other Ambulatory Visit: Payer: Self-pay | Admitting: Family Medicine

## 2018-12-14 NOTE — Telephone Encounter (Signed)
lvm with Luna Fuse (daughter) to call us back to schedule virtual visit

## 2018-12-14 NOTE — Telephone Encounter (Signed)
Please contact patient and have pt set up appt; then route to nurses. Thank you

## 2018-12-14 NOTE — Telephone Encounter (Signed)
lvm with Luna Fuse (daughter) to schedule virtual visit

## 2018-12-14 NOTE — Telephone Encounter (Signed)
Patient daughter scheduled appointment for 8/3

## 2018-12-15 NOTE — Telephone Encounter (Signed)
Pt has appt on 8/3

## 2018-12-19 ENCOUNTER — Other Ambulatory Visit: Payer: Self-pay | Admitting: Family Medicine

## 2018-12-26 ENCOUNTER — Other Ambulatory Visit: Payer: Self-pay

## 2018-12-26 ENCOUNTER — Ambulatory Visit (INDEPENDENT_AMBULATORY_CARE_PROVIDER_SITE_OTHER): Payer: Medicare Other | Admitting: Family Medicine

## 2018-12-26 DIAGNOSIS — F0150 Vascular dementia without behavioral disturbance: Secondary | ICD-10-CM | POA: Diagnosis not present

## 2018-12-26 DIAGNOSIS — I7090 Unspecified atherosclerosis: Secondary | ICD-10-CM | POA: Diagnosis not present

## 2018-12-26 DIAGNOSIS — E785 Hyperlipidemia, unspecified: Secondary | ICD-10-CM | POA: Diagnosis not present

## 2018-12-26 DIAGNOSIS — I4891 Unspecified atrial fibrillation: Secondary | ICD-10-CM

## 2018-12-26 DIAGNOSIS — R5381 Other malaise: Secondary | ICD-10-CM | POA: Diagnosis not present

## 2018-12-26 NOTE — Progress Notes (Signed)
Subjective:    Patient ID: Ethan Faulkner, male    DOB: [birth year], 85 y.o.   MRN: [id number]  Hypertension This is a chronic problem. Pertinent negatives include no chest pain, headaches or shortness of breath. Treatments tried: norvasc, metoprolol. There are no compliance problems.     Daughter [name] states he is doing well. No concerns. Takes meds everyday, eating well.  No bleeding issues.  Taking medicines well.  Getting up with a walker.  Still has the effects of stroke.  Overall family is pitching in together to help him from day-to-day.  No setbacks recently.  PMH stroke, atrial fibrillation will need flu vaccine later this fall  This patient does have atrial fibrillation.  Reasonably under good control.  Family states heart rate not running fast blood pressure is been good   Because of atrial fibrillation he is taking Eliquis but he is not having any bleeding problems.  Patient also on Lipitor tolerating well no body aches previous labs look good currently right now family does not want to do lab work because of coronavirus  Indwelling Foley catheter this is followed by urology recently patient with dark urine with increased smell to it taking liquids well but no fevers no pain Virtual Visit via Telephone Note  I connected with Darl Pikes on 12/26/18 at  1:10 PM EDT by telephone and verified that I am speaking with the correct person using two identifiers.  Location: Patient: home Provider: office   I discussed the limitations, risks, security and privacy concerns of performing an evaluation and management service by telephone and the availability of in person appointments. I also discussed with the patient that there may be a patient responsible charge related to this service. The patient expressed understanding and agreed to proceed.   History of Present Illness:    Observations/Objective:   Assessment and Plan:   Follow Up Instructions:    I discussed  the assessment and treatment plan with the patient. The patient was provided an opportunity to ask questions and all were answered. The patient agreed with the plan and demonstrated an understanding of the instructions.   The patient was advised to call back or seek an in-person evaluation if the symptoms worsen or if the condition fails to improve as anticipated.  I provided 25 minutes of non-face-to-face time during this encounter.      Review of Systems  Constitutional: Negative for diaphoresis and fatigue.  HENT: Negative for congestion and rhinorrhea.   Respiratory: Negative for cough and shortness of breath.   Cardiovascular: Negative for chest pain and leg swelling.  Gastrointestinal: Negative for abdominal pain and diarrhea.  Genitourinary: Negative for difficulty urinating, dysuria and hematuria.  Skin: Negative for color change and rash.  Neurological: Negative for dizziness and headaches.  Psychiatric/Behavioral: Negative for behavioral problems and confusion.   Patient is able to understand what is said to him but cannot speak well because of previous stroke patient does have some difficulties comprehending what was said to him because of the stroke    Objective:   Physical Exam  Patient had virtual visit Appears to be in no distress Atraumatic Neuro able to relate and oriented No apparent resp distress Color normal       Assessment & Plan:  1. Atrial fibrillation, unspecified type ([place]) Seemingly under good control no rapid ventricular response continue current medications  2. 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 Hyperlipidemia, unspecified hyperlipidemia type Mild hyperlipidemia but under good control with medication family prefers to continue the medicine but we will hold off on any lab work currently  4. Physical deconditioning Mild frailty but he does get up with his walker  4 times daily and walk within the house which is pretty amazing for 85 years old  Hypothyroidism previous thyroid test looks good family will hold off on lab work currently and possibly due later this year or springtime  Flu shot this fall follow-up in 6 months

## 2018-12-27 ENCOUNTER — Ambulatory Visit (INDEPENDENT_AMBULATORY_CARE_PROVIDER_SITE_OTHER): Payer: Medicare Other | Admitting: Urology

## 2018-12-27 ENCOUNTER — Other Ambulatory Visit (HOSPITAL_COMMUNITY)
Admission: RE | Admit: 2018-12-27 | Discharge: 2018-12-27 | Disposition: A | Discharge status: Home / Self Care | Payer: Medicare Other | Source: Ambulatory Visit | Attending: Urology | Admitting: Urology

## 2018-12-27 DIAGNOSIS — N3000 Acute cystitis without hematuria: Secondary | ICD-10-CM | POA: Insufficient documentation

## 2018-12-27 DIAGNOSIS — R338 Other retention of urine: Secondary | ICD-10-CM | POA: Diagnosis not present

## 2018-12-27 LAB — URINALYSIS, ROUTINE W REFLEX MICROSCOPIC
Bilirubin Urine: NEGATIVE
Glucose, UA: NEGATIVE mg/dL
Ketones, ur: NEGATIVE mg/dL
Nitrite: NEGATIVE
Protein, ur: 30 mg/dL — AB
Specific Gravity, Urine: 1.01 (ref 1.005–1.030)
WBC, UA: >50 WBC/hpf — ABNORMAL HIGH (ref 0–5)
pH: 6 (ref 5.0–8.0)

## 2018-12-28 LAB — URINE CULTURE

## 2019-01-10 ENCOUNTER — Other Ambulatory Visit: Payer: Self-pay | Admitting: Family Medicine

## 2019-01-13 ENCOUNTER — Other Ambulatory Visit: Payer: Self-pay | Admitting: *Deleted

## 2019-01-13 ENCOUNTER — Telehealth: Payer: Self-pay | Admitting: Family Medicine

## 2019-01-13 MED ORDER — CEFPROZIL 500 MG PO TABS: 500.0000 mg | ORAL_TABLET | Freq: Two times a day (BID) | ORAL | 0 refills | Status: DC

## 2019-01-13 NOTE — Telephone Encounter (Signed)
cefzil 500 bid 7 d

## 2019-01-13 NOTE — Telephone Encounter (Signed)
Please clarify directions because I do not see this med on med list or med history. Called daughter she did not have bottle anymore. Is it bid for 5 days. I called pharm but there phone is down so I will print script and drop off at [facility] for pt.

## 2019-01-13 NOTE — Telephone Encounter (Signed)
Pt was given Cefzil 500 mgs for UTI on 8/9. Pt has finished the medication and was doing better but now his urine is back to being a dark color and pt is having some pain. Pt does have a catheter in. She is wanting to know if another round of antibiotics can be called in.   Please call [name] daughter [phone]  [facility] - [place], [place] - [place]

## 2019-01-13 NOTE — Telephone Encounter (Signed)
Script printed and taken to pharm for pt and sherri notified.

## 2019-01-13 NOTE — Telephone Encounter (Signed)
Repeat same , good antibi for probable uti

## 2019-01-24 ENCOUNTER — Ambulatory Visit (INDEPENDENT_AMBULATORY_CARE_PROVIDER_SITE_OTHER): Payer: Medicare Other | Admitting: Urology

## 2019-01-24 DIAGNOSIS — R338 Other retention of urine: Secondary | ICD-10-CM

## 2019-01-31 ENCOUNTER — Other Ambulatory Visit: Payer: Self-pay | Admitting: Family Medicine

## 2019-01-31 ENCOUNTER — Other Ambulatory Visit (HOSPITAL_COMMUNITY)
Admission: RE | Admit: 2019-01-31 | Discharge: 2019-01-31 | Disposition: A | Discharge status: Home / Self Care | Payer: Medicare Other | Source: Ambulatory Visit | Attending: Urology | Admitting: Urology

## 2019-01-31 DIAGNOSIS — R338 Other retention of urine: Secondary | ICD-10-CM | POA: Diagnosis not present

## 2019-02-02 LAB — URINE CULTURE: Culture: >=100000 — AB

## 2019-02-08 ENCOUNTER — Other Ambulatory Visit: Payer: Self-pay | Admitting: Family Medicine

## 2019-02-09 NOTE — Telephone Encounter (Signed)
5 refills each 

## 2019-02-28 ENCOUNTER — Ambulatory Visit (INDEPENDENT_AMBULATORY_CARE_PROVIDER_SITE_OTHER): Payer: Medicare Other | Admitting: Urology

## 2019-02-28 ENCOUNTER — Other Ambulatory Visit: Payer: Self-pay | Admitting: Family Medicine

## 2019-02-28 DIAGNOSIS — R338 Other retention of urine: Secondary | ICD-10-CM | POA: Diagnosis not present

## 2019-03-01 ENCOUNTER — Other Ambulatory Visit: Payer: Self-pay

## 2019-03-01 ENCOUNTER — Other Ambulatory Visit: Payer: Self-pay | Admitting: *Deleted

## 2019-03-01 ENCOUNTER — Ambulatory Visit (INDEPENDENT_AMBULATORY_CARE_PROVIDER_SITE_OTHER): Payer: Medicare Other | Admitting: Family Medicine

## 2019-03-01 DIAGNOSIS — J019 Acute sinusitis, unspecified: Secondary | ICD-10-CM

## 2019-03-01 DIAGNOSIS — Z20828 Contact with and (suspected) exposure to other viral communicable diseases: Secondary | ICD-10-CM | POA: Diagnosis not present

## 2019-03-01 DIAGNOSIS — Z20822 Contact with and (suspected) exposure to covid-19: Secondary | ICD-10-CM

## 2019-03-01 MED ORDER — AZITHROMYCIN 250 MG PO TABS: ORAL_TABLET | ORAL | 0 refills | Status: DC

## 2019-03-01 NOTE — Progress Notes (Signed)
  Subjective:     Patient ID: Ethan Faulkner, male   DOB: 01-Mar-1916, 85 y.o.   MRN: [id number]  HPI Video visit failed Phone visit only 85 year old gentleman with off and on coughing for the past 4 days some phlegm sometimes clear sometimes discolored denies high fever chills denies vomiting very difficult for the patient communicate because of previous stroke but family is able to communicate for him.  They do not feel he is in respiratory difficulty.  They have limited number of people who do come around him but at least 4 different households do come around him PMH benign he does have atrial fib he is on blood thinners no bleeding issues the phlegm occasionally has some red tinge to it but no acute bleeding issues  Review of Systems  Constitutional: Negative for activity change, chills and fever.  HENT: Positive for congestion and rhinorrhea. Negative for ear pain.   Eyes: Negative for discharge.  Respiratory: Positive for cough. Negative for wheezing.   Cardiovascular: Negative for chest pain.  Gastrointestinal: Negative for nausea and vomiting.  Musculoskeletal: Negative for arthralgias.       Objective:   Physical Exam Today's visit was via telephone Physical exam was not possible for this visit     Assessment:     Upper respiratory illness possible acute rhinosinusitis also possible bronchitis I cannot rule out the possibility of COVID    Plan:     COVID testing recommended, antibiotics, if progressive symptoms or progressive breathing issues or shortness of breath go to ER right away follow-up sooner if any problems

## 2019-03-03 LAB — NOVEL CORONAVIRUS, NAA: SARS-CoV-2, NAA: NOT DETECTED

## 2019-03-08 ENCOUNTER — Telehealth: Payer: Self-pay | Admitting: Family Medicine

## 2019-03-08 NOTE — Telephone Encounter (Signed)
Please advise. Thank you

## 2019-03-08 NOTE — Telephone Encounter (Signed)
Negative If having any problems please let us know

## 2019-03-08 NOTE — Telephone Encounter (Signed)
[name] notified and states he is doing well and will call if any problems

## 2019-03-08 NOTE — Telephone Encounter (Signed)
Never got Covid test results  Please advise & call [name] [phone]

## 2019-03-17 ENCOUNTER — Emergency Department (HOSPITAL_COMMUNITY): Payer: Medicare Other

## 2019-03-17 ENCOUNTER — Other Ambulatory Visit: Payer: Self-pay

## 2019-03-17 ENCOUNTER — Encounter (HOSPITAL_COMMUNITY): Payer: Self-pay

## 2019-03-17 ENCOUNTER — Inpatient Hospital Stay (HOSPITAL_COMMUNITY)
Admission: EM | Admit: 2019-03-17 | Discharge: 2019-03-21 | DRG: 065 | Disposition: A | Discharge status: Home Health | Payer: Medicare Other | Attending: Internal Medicine | Admitting: Internal Medicine

## 2019-03-17 DIAGNOSIS — I517 Cardiomegaly: Secondary | ICD-10-CM | POA: Diagnosis not present

## 2019-03-17 DIAGNOSIS — Z03818 Encounter for observation for suspected exposure to other biological agents ruled out: Secondary | ICD-10-CM | POA: Diagnosis not present

## 2019-03-17 DIAGNOSIS — E039 Hypothyroidism, unspecified: Secondary | ICD-10-CM | POA: Diagnosis present

## 2019-03-17 DIAGNOSIS — Y846 Urinary catheterization as the cause of abnormal reaction of the patient, or of later complication, without mention of misadventure at the time of the procedure: Secondary | ICD-10-CM | POA: Diagnosis present

## 2019-03-17 DIAGNOSIS — I4891 Unspecified atrial fibrillation: Secondary | ICD-10-CM | POA: Diagnosis present

## 2019-03-17 DIAGNOSIS — N3001 Acute cystitis with hematuria: Secondary | ICD-10-CM | POA: Diagnosis not present

## 2019-03-17 DIAGNOSIS — Z20828 Contact with and (suspected) exposure to other viral communicable diseases: Secondary | ICD-10-CM | POA: Diagnosis not present

## 2019-03-17 DIAGNOSIS — Z8673 Personal history of transient ischemic attack (TIA), and cerebral infarction without residual deficits: Secondary | ICD-10-CM

## 2019-03-17 DIAGNOSIS — G8194 Hemiplegia, unspecified affecting left nondominant side: Secondary | ICD-10-CM | POA: Diagnosis present

## 2019-03-17 DIAGNOSIS — Z823 Family history of stroke: Secondary | ICD-10-CM

## 2019-03-17 DIAGNOSIS — R29704 NIHSS score 4: Secondary | ICD-10-CM | POA: Diagnosis present

## 2019-03-17 DIAGNOSIS — Z7901 Long term (current) use of anticoagulants: Secondary | ICD-10-CM

## 2019-03-17 DIAGNOSIS — Z66 Do not resuscitate: Secondary | ICD-10-CM | POA: Diagnosis not present

## 2019-03-17 DIAGNOSIS — B965 Pseudomonas (aeruginosa) (mallei) (pseudomallei) as the cause of diseases classified elsewhere: Secondary | ICD-10-CM | POA: Diagnosis present

## 2019-03-17 DIAGNOSIS — I10 Essential (primary) hypertension: Secondary | ICD-10-CM | POA: Diagnosis present

## 2019-03-17 DIAGNOSIS — D696 Thrombocytopenia, unspecified: Secondary | ICD-10-CM | POA: Diagnosis present

## 2019-03-17 DIAGNOSIS — Z8546 Personal history of malignant neoplasm of prostate: Secondary | ICD-10-CM

## 2019-03-17 DIAGNOSIS — Z79899 Other long term (current) drug therapy: Secondary | ICD-10-CM

## 2019-03-17 DIAGNOSIS — Z96642 Presence of left artificial hip joint: Secondary | ICD-10-CM | POA: Diagnosis present

## 2019-03-17 DIAGNOSIS — G459 Transient cerebral ischemic attack, unspecified: Secondary | ICD-10-CM

## 2019-03-17 DIAGNOSIS — E785 Hyperlipidemia, unspecified: Secondary | ICD-10-CM | POA: Diagnosis present

## 2019-03-17 DIAGNOSIS — N390 Urinary tract infection, site not specified: Secondary | ICD-10-CM | POA: Diagnosis not present

## 2019-03-17 DIAGNOSIS — Z79890 Hormone replacement therapy (postmenopausal): Secondary | ICD-10-CM

## 2019-03-17 DIAGNOSIS — T83518A Infection and inflammatory reaction due to other urinary catheter, initial encounter: Secondary | ICD-10-CM | POA: Diagnosis present

## 2019-03-17 DIAGNOSIS — I69322 Dysarthria following cerebral infarction: Secondary | ICD-10-CM

## 2019-03-17 DIAGNOSIS — F0150 Vascular dementia without behavioral disturbance: Secondary | ICD-10-CM | POA: Diagnosis present

## 2019-03-17 DIAGNOSIS — R404 Transient alteration of awareness: Secondary | ICD-10-CM | POA: Diagnosis not present

## 2019-03-17 DIAGNOSIS — R410 Disorientation, unspecified: Secondary | ICD-10-CM | POA: Diagnosis not present

## 2019-03-17 DIAGNOSIS — I6340 Cerebral infarction due to embolism of unspecified cerebral artery: Principal | ICD-10-CM | POA: Diagnosis present

## 2019-03-17 DIAGNOSIS — I63412 Cerebral infarction due to embolism of left middle cerebral artery: Secondary | ICD-10-CM

## 2019-03-17 DIAGNOSIS — Z923 Personal history of irradiation: Secondary | ICD-10-CM

## 2019-03-17 DIAGNOSIS — R4182 Altered mental status, unspecified: Secondary | ICD-10-CM | POA: Diagnosis not present

## 2019-03-17 DIAGNOSIS — I480 Paroxysmal atrial fibrillation: Secondary | ICD-10-CM | POA: Diagnosis present

## 2019-03-17 DIAGNOSIS — Z87891 Personal history of nicotine dependence: Secondary | ICD-10-CM

## 2019-03-17 DIAGNOSIS — F0280 Dementia in other diseases classified elsewhere without behavioral disturbance: Secondary | ICD-10-CM | POA: Diagnosis present

## 2019-03-17 DIAGNOSIS — I739 Peripheral vascular disease, unspecified: Secondary | ICD-10-CM | POA: Diagnosis present

## 2019-03-17 DIAGNOSIS — R531 Weakness: Secondary | ICD-10-CM | POA: Diagnosis not present

## 2019-03-17 LAB — URINALYSIS, ROUTINE W REFLEX MICROSCOPIC
Bilirubin Urine: NEGATIVE
Glucose, UA: NEGATIVE mg/dL
Ketones, ur: NEGATIVE mg/dL
Nitrite: POSITIVE — AB
Protein, ur: NEGATIVE mg/dL
Specific Gravity, Urine: 1.017 (ref 1.005–1.030)
WBC, UA: >50 WBC/hpf — ABNORMAL HIGH (ref 0–5)
pH: 5 (ref 5.0–8.0)

## 2019-03-17 LAB — COMPREHENSIVE METABOLIC PANEL
ALT: 16 U/L (ref 0–44)
AST: 19 U/L (ref 15–41)
Albumin: 4.1 g/dL (ref 3.5–5.0)
Alkaline Phosphatase: 57 U/L (ref 38–126)
Anion gap: 8 (ref 5–15)
BUN: 28 mg/dL — ABNORMAL HIGH (ref 8–23)
CO2: 22 mmol/L (ref 22–32)
Calcium: 9 mg/dL (ref 8.9–10.3)
Chloride: 107 mmol/L (ref 98–111)
Creatinine, Ser: 0.98 mg/dL (ref 0.61–1.24)
GFR calc Af Amer: >60 mL/min (ref >60)
GFR calc non Af Amer: >60 mL/min (ref >60)
Glucose, Bld: 106 mg/dL — ABNORMAL HIGH (ref 70–99)
Potassium: 4.4 mmol/L (ref 3.5–5.1)
Sodium: 137 mmol/L (ref 135–145)
Total Bilirubin: 1.4 mg/dL — ABNORMAL HIGH (ref 0.3–1.2)
Total Protein: 7.1 g/dL (ref 6.5–8.1)

## 2019-03-17 LAB — CBC WITH DIFFERENTIAL/PLATELET
Abs Immature Granulocytes: 0.02 10*3/uL (ref 0.00–0.07)
Basophils Absolute: 0 10*3/uL (ref 0.0–0.1)
Basophils Relative: 0 %
Eosinophils Absolute: 0.1 10*3/uL (ref 0.0–0.5)
Eosinophils Relative: 3 %
HCT: 41.6 % (ref 39.0–52.0)
Hemoglobin: 13.1 g/dL (ref 13.0–17.0)
Immature Granulocytes: 0 %
Lymphocytes Relative: 27 %
Lymphs Abs: 1.3 10*3/uL (ref 0.7–4.0)
MCH: 30.8 pg (ref 26.0–34.0)
MCHC: 31.5 g/dL (ref 30.0–36.0)
MCV: 97.9 fL (ref 80.0–100.0)
Monocytes Absolute: 0.6 10*3/uL (ref 0.1–1.0)
Monocytes Relative: 12 %
Neutro Abs: 2.6 10*3/uL (ref 1.7–7.7)
Neutrophils Relative %: 58 %
Platelets: 164 10*3/uL (ref 150–400)
RBC: 4.25 MIL/uL (ref 4.22–5.81)
RDW: 13.9 % (ref 11.5–15.5)
WBC: 4.6 10*3/uL (ref 4.0–10.5)
nRBC: 0 % (ref 0.0–0.2)

## 2019-03-17 MED ORDER — ASPIRIN 300 MG RE SUPP: 300.0000 mg | Freq: Every day | RECTAL | Status: DC

## 2019-03-17 MED ORDER — METOPROLOL SUCCINATE ER 25 MG PO TB24
25.0000 mg | ORAL_TABLET | Freq: Every day | ORAL | Status: DC
  Administered 2019-03-17 – 2019-03-21 (×5): 25 mg via ORAL
  Filled 2019-03-17 (×5): qty 1

## 2019-03-17 MED ORDER — ATORVASTATIN CALCIUM 10 MG PO TABS
20.0000 mg | ORAL_TABLET | Freq: Every day | ORAL | Status: DC
  Administered 2019-03-18 – 2019-03-19 (×2): 20 mg via ORAL
  Filled 2019-03-17 (×2): qty 2

## 2019-03-17 MED ORDER — APIXABAN 2.5 MG PO TABS
2.5000 mg | ORAL_TABLET | Freq: Two times a day (BID) | ORAL | Status: DC
  Filled 2019-03-17 (×5): qty 1

## 2019-03-17 MED ORDER — STROKE: EARLY STAGES OF RECOVERY BOOK
Freq: Once | Status: DC
  Filled 2019-03-17: qty 1

## 2019-03-17 MED ORDER — LEVOTHYROXINE SODIUM 25 MCG PO TABS
25.0000 ug | ORAL_TABLET | Freq: Every day | ORAL | Status: DC
  Administered 2019-03-19 – 2019-03-21 (×3): 25 ug via ORAL
  Filled 2019-03-17 (×4): qty 1

## 2019-03-17 MED ORDER — SODIUM CHLORIDE 0.9 % IV SOLN
1.0000 g | INTRAVENOUS | Status: DC
  Administered 2019-03-17 – 2019-03-19 (×3): 1 g via INTRAVENOUS
  Filled 2019-03-17 (×3): qty 10

## 2019-03-17 MED ORDER — DOCUSATE SODIUM 100 MG PO CAPS
100.0000 mg | ORAL_CAPSULE | Freq: Every day | ORAL | Status: DC
  Administered 2019-03-17 – 2019-03-21 (×5): 100 mg via ORAL
  Filled 2019-03-17 (×6): qty 1

## 2019-03-17 MED ORDER — SODIUM CHLORIDE 0.9 % IV SOLN
INTRAVENOUS | Status: AC
  Administered 2019-03-17: 22:00:00 via INTRAVENOUS

## 2019-03-17 MED ORDER — LORATADINE 10 MG PO TABS
10.0000 mg | ORAL_TABLET | Freq: Every day | ORAL | Status: DC
  Administered 2019-03-17 – 2019-03-21 (×5): 10 mg via ORAL
  Filled 2019-03-17 (×5): qty 1

## 2019-03-17 MED ORDER — AMLODIPINE BESYLATE 5 MG PO TABS
5.0000 mg | ORAL_TABLET | Freq: Every day | ORAL | Status: DC
  Filled 2019-03-17: qty 1

## 2019-03-17 MED ORDER — ACETAMINOPHEN 650 MG RE SUPP: 650.0000 mg | RECTAL | Status: DC | PRN

## 2019-03-17 MED ORDER — CIPROFLOXACIN IN D5W 400 MG/200ML IV SOLN
400.0000 mg | Freq: Once | INTRAVENOUS | Status: AC
  Administered 2019-03-17: 400 mg via INTRAVENOUS
  Filled 2019-03-17: qty 200

## 2019-03-17 MED ORDER — TRAMADOL HCL 50 MG PO TABS
50.0000 mg | ORAL_TABLET | Freq: Three times a day (TID) | ORAL | Status: DC | PRN
  Administered 2019-03-17: 22:00:00 50 mg via ORAL
  Filled 2019-03-17 (×2): qty 1

## 2019-03-17 MED ORDER — ACETAMINOPHEN 325 MG PO TABS
650.0000 mg | ORAL_TABLET | ORAL | Status: DC | PRN
  Administered 2019-03-18: 650 mg via ORAL
  Filled 2019-03-17: qty 2

## 2019-03-17 MED ORDER — ASPIRIN 325 MG PO TABS
325.0000 mg | ORAL_TABLET | Freq: Every day | ORAL | Status: DC
  Administered 2019-03-18: 325 mg via ORAL
  Filled 2019-03-17 (×2): qty 1

## 2019-03-17 MED ORDER — ACETAMINOPHEN 160 MG/5ML PO SOLN: 650.0000 mg | ORAL | Status: DC | PRN

## 2019-03-17 NOTE — Progress Notes (Signed)
ANTICOAGULATION CONSULT NOTE - Initial Consult  Pharmacy Consult for Apixaban Indication: atrial fibrillation  Allergies  Allergen Reactions  . Xanax [Alprazolam] Other (See Comments)    Dizzy, Sedated  . Tape Rash    PATIENT'S SKIN WILL TEAR/PLEASE EITHER USE PAPER TAPE OR COBAN WRAP!!    Patient Measurements:  70.3kg on 05/06/2018   Vital Signs: BP: 155/80 (10/23 1900) Pulse Rate: 80 (10/23 1900)  Labs: Recent Labs    03/17/19 1511  HGB 13.1  HCT 41.6  PLT 164  CREATININE 0.98    CrCl cannot be calculated (Unknown ideal weight.).   Medical History: Past Medical History:  Diagnosis Date  . Afib ([place])   . Atrial fibrillation ([place])   . Cerebral infarction ([place])   . DDD (degenerative disc disease), lumbar   . Dysphagia   . Hypertension   . Hypothyroidism   . Migraine    "maybe monthly" (11/21/2015)  . Prostate cancer ([place]) ~ 1995   S/P radiation tx  . Renal mass    "slow growing something; doctors just watching it right now" (11/21/2015)  . Stroke [facility])    TIA  . Thrombocytopenia ([place])   . Thyroid disease    hypothyroid    Medications:  Scheduled:  .  stroke: mapping our early stages of recovery book   Does not apply Once  . aspirin  300 mg Rectal Daily   Or  . aspirin  325 mg Oral Daily   Infusions:  . sodium chloride    . cefTRIAXone (ROCEPHIN)  IV    . ciprofloxacin 400 mg (03/17/19 1929)   PRN: acetaminophen **OR** acetaminophen (TYLENOL) oral liquid 160 mg/5 mL **OR** acetaminophen  Assessment: 85 yo male on chronic apixaban for hx afib.  He is admitted for weakness in his legs and confusion. CT head negative for acute stroke.  CBC and SCr wnl.  Goal of Therapy:  Prevention of VTE/stroke Monitor platelets by anticoagulation protocol: Yes   Plan:  Continue with home dose of apixaban 2.5mg  PO bid No further dose adjustment needed, Pharmacy will sign-off  Peggyann Juba, PharmD, BCPS 03/17/2019,8:11 PM

## 2019-03-17 NOTE — H&P (Signed)
TRH H&P    Patient Demographics:    Ethan Faulkner, is a 85 y.o. male  MRN: [id number]  DOB - 14-May-1916  Admit Date - 03/17/2019  Referring MD/NP/PA:   Catalina Pizza   Outpatient Primary MD for the patient is Kathyrn Drown, MD  Patient coming from:   home  Chief complaint-  Left arm weakness, bilateral leg weakness   HPI:    [name]  is a 85 y.o. male, w hypothyroidism, hypertension, hyperlipidemia, Pafib, h/o stroke w baseline slurred speech, presents with weakness of the left arm starting at 9am this morning per family.  They were concerned because he was not able to walk with his walker as he typically does at baseline. Concern was for stroke.  Pt presented outside the TPA window.   In ED,  T afebrile, P 74  R 18, Bp 138/67,  Pox 96% on RA  Wbc 4.6, Hgb 13.1, Plt 164 Na 137, K 4.4, Bun 28, Creatinine0.98  Ast 19, Alt 16 Urinalysis wbc >50, Rbc 21-50  Pt give cipro 400mg  iv x1 in the Ed,   Pt will be admitted for left arm weakness, and bilateral leg weakness, r/o TIA/ CVA.  And acute lower UTI.         Review of systems:    In addition to the HPI above,  No Fever-chills, No Headache, No changes with Vision or hearing, No problems swallowing food or Liquids, No Chest pain, Cough or Shortness of Breath, No Abdominal pain, No Nausea or Vomiting, bowel movements are regular, No Blood in stool or Urine, + dysuria No new skin rashes or bruises, No new joints pains-aches,   No recent weight gain or loss, No polyuria, polydypsia or polyphagia, No significant Mental Stressors.  All other systems reviewed and are negative.    Past History of the following :    Past Medical History:  Diagnosis Date   Afib ([place])    Atrial fibrillation ([place])    Cerebral infarction ([place])    DDD (degenerative disc disease), lumbar    Dysphagia    Hypertension    Hypothyroidism     Migraine    "maybe monthly" (11/21/2015)   Prostate cancer ([place]) ~ 1995   S/P radiation tx   Renal mass    "slow growing something; doctors just watching it right now" (11/21/2015)   Stroke ([place])    TIA   Thrombocytopenia ([place])    Thyroid disease    hypothyroid      Past Surgical History:  Procedure Laterality Date   COLONOSCOPY  1999   ESOPHAGOGASTRODUODENOSCOPY     LAPAROSCOPIC CHOLECYSTECTOMY  1980s   PROSTATE BIOPSY  ~ [place] Left 11/22/2015   Procedure: LEFT HIP HEMIARTHROPLASTY ;  Surgeon: Leandrew Koyanagi, MD;  Location: [facility];  Service: Orthopedics;  Laterality: Left;      Social History:      Social History   Tobacco Use   Smoking status: Former Smoker   Smokeless tobacco: Never Used   Tobacco  comment: 11/21/2015 "might have smoked 2 packs of cigarettes in my lifetime"  Substance Use Topics   Alcohol use: Yes    Comment: 11/21/2015 "last beer I've had was in ~ 2007"       Family History :     Family History  Problem Relation Age of Onset   Stroke Sister        in her 47s   CAD Neg Hx    COPD Neg Hx    Diabetes Neg Hx    Heart disease Neg Hx       Home Medications:   Prior to Admission medications   Medication Sig Start Date End Date Taking? Authorizing Provider  amLODipine (NORVASC) 5 MG tablet TAKE (1) TABLET BY MOUTH ONCE DAILY. 02/28/19  Yes Luking, Elayne Snare, MD  ampicillin (PRINCIPEN) 500 MG capsule Take 500 mg by mouth daily. 03/07/19  Yes [provider]  atorvastatin (LIPITOR) 20 MG tablet TAKE 1 TABLET BY MOUTH ONCE DAILY AT 6:00 PM. 02/09/19  Yes Luking, Elayne Snare, MD  cetirizine (ZYRTEC) 10 MG tablet Take 10 mg by mouth every evening.    Yes [provider]  ELIQUIS 2.5 MG TABS tablet TAKE (1) TABLET BY MOUTH TWICE DAILY 9AM AND 9PM. 02/09/19  Yes Luking, Elayne Snare, MD  levothyroxine (SYNTHROID) 25 MCG tablet TAKE 1 TABLET EVERY MORNING ON AN EMPTY STOMACH FOR THYROID. 02/09/19  Yes Luking,  Scott A, MD  metoprolol succinate (TOPROL-XL) 25 MG 24 hr tablet TAKE (1) TABLET BY MOUTH ONCE DAILY. 02/28/19  Yes Luking, Elayne Snare, MD  QC NATURA-LAX 17 GM/SCOOP powder MIX 1 CAPFUL IN 8 OZ OF WATER AND DRINK TWICE WEEKLY. 12/06/18  Yes Luking, Elayne Snare, MD  traMADol (ULTRAM) 50 MG tablet TAKE ONE TABLET 3 TIMES DAILY AS NEEDED FOR SEVERE PAIN. 02/28/19  Yes Luking, Elayne Snare, MD  azithromycin (ZITHROMAX Z-PAK) 250 MG tablet Take 2 tablets (500 mg) on  Day 1,  followed by 1 tablet (250 mg) once daily on Days 2 through 5. 03/01/19   Luking, Scott A, MD  docusate sodium (COLACE) 100 MG capsule Take 100 mg by mouth daily.    [provider]  ketoconazole (NIZORAL) 2 % cream APPLY TO AFFECTED AREAS TWICE DAILY. Patient not taking: Reported on 03/17/2019 10/19/18   Kathyrn Drown, MD     Allergies:     Allergies  Allergen Reactions   Xanax [Alprazolam] Other (See Comments)    Dizzy, Sedated   Tape Rash    PATIENT'S SKIN WILL TEAR/PLEASE EITHER USE PAPER TAPE OR COBAN WRAP!!     Physical Exam:   Vitals  Blood pressure (!) 155/80, pulse 80, resp. rate 15, SpO2 96 %.  1.  General: axox3  2. Psychiatric: euthymic  3. Neurologic: cn2-12 intact, reflexes 2+ symmetric, diffuse with no clonus, motor 5/5 in all 4 ext,  Left hand is slightly weaker grip Speech slurred, might have some slight expressive and receptive aphasia.   4. HEENMT:  Anicteric, pupils 1.65mm , symmetric, direct, consensual intact ? Slight left facial droop Tongue midline Neck: no jvd, no bruit  5. Respiratory : CTAB  6. Cardiovascular : Irr, irr s1, s2, 1/6 sem apex  7. Gastrointestinal:  ABd: soft, nt, nd, +bs  8. Skin:  Ext: no c/c/e, no rash  9.Musculoskeletal:  Good ROM    Data Review:    CBC Recent Labs  Lab 03/17/19 1511  WBC 4.6  HGB 13.1  HCT 41.6  PLT 164  MCV 97.9  MCH 30.8  MCHC 31.5  RDW 13.9  LYMPHSABS 1.3  MONOABS 0.6  EOSABS 0.1  BASOSABS 0.0    ------------------------------------------------------------------------------------------------------------------  Results for orders placed or performed during the hospital encounter of 03/17/19 (from the past 48 hour(s))  CBC with Differential/Platelet     Status: None   Collection Time: 03/17/19  3:11 PM  Result Value Ref Range   WBC 4.6 4.0 - 10.5 K/uL   RBC 4.25 4.22 - 5.81 MIL/uL   Hemoglobin 13.1 13.0 - 17.0 g/dL   HCT 41.6 39.0 - 52.0 %   MCV 97.9 80.0 - 100.0 fL   MCH 30.8 26.0 - 34.0 pg   MCHC 31.5 30.0 - 36.0 g/dL   RDW 13.9 11.5 - 15.5 %   Platelets 164 150 - 400 K/uL   nRBC 0.0 0.0 - 0.2 %   Neutrophils Relative % 58 %   Neutro Abs 2.6 1.7 - 7.7 K/uL   Lymphocytes Relative 27 %   Lymphs Abs 1.3 0.7 - 4.0 K/uL   Monocytes Relative 12 %   Monocytes Absolute 0.6 0.1 - 1.0 K/uL   Eosinophils Relative 3 %   Eosinophils Absolute 0.1 0.0 - 0.5 K/uL   Basophils Relative 0 %   Basophils Absolute 0.0 0.0 - 0.1 K/uL   Immature Granulocytes 0 %   Abs Immature Granulocytes 0.02 0.00 - 0.07 K/uL    Comment: Performed at [facility], [address] [place], [place] [address]  Comprehensive metabolic panel     Status: Abnormal   Collection Time: 03/17/19  3:11 PM  Result Value Ref Range   Sodium 137 135 - 145 mmol/L   Potassium 4.4 3.5 - 5.1 mmol/L   Chloride 107 98 - 111 mmol/L   CO2 22 22 - 32 mmol/L   Glucose, Bld 106 (H) 70 - 99 mg/dL   BUN 28 (H) 8 - 23 mg/dL   Creatinine, Ser 0.98 0.61 - 1.24 mg/dL   Calcium 9.0 8.9 - 10.3 mg/dL   Total Protein 7.1 6.5 - 8.1 g/dL   Albumin 4.1 3.5 - 5.0 g/dL   AST 19 15 - 41 U/L   ALT 16 0 - 44 U/L   Alkaline Phosphatase 57 38 - 126 U/L   Total Bilirubin 1.4 (H) 0.3 - 1.2 mg/dL   GFR calc non Af Amer >60 >60 mL/min   GFR calc Af Amer >60 >60 mL/min   Anion gap 8 5 - 15    Comment: Performed at [facility], [address] [place], [place] [address]  Urinalysis, Routine w reflex microscopic     Status: Abnormal   Collection  Time: 03/17/19  5:38 PM  Result Value Ref Range   Color, Urine YELLOW YELLOW   APPearance HAZY (A) CLEAR   Specific Gravity, Urine 1.017 1.005 - 1.030   pH 5.0 5.0 - 8.0   Glucose, UA NEGATIVE NEGATIVE mg/dL   Hgb urine dipstick MODERATE (A) NEGATIVE   Bilirubin Urine NEGATIVE NEGATIVE   Ketones, ur NEGATIVE NEGATIVE mg/dL   Protein, ur NEGATIVE NEGATIVE mg/dL   Nitrite POSITIVE (A) NEGATIVE   Leukocytes,Ua LARGE (A) NEGATIVE   RBC / HPF 21-50 0 - 5 RBC/hpf   WBC, UA >50 (H) 0 - 5 WBC/hpf   Bacteria, UA RARE (A) NONE SEEN   WBC Clumps PRESENT    Mucus PRESENT     Comment: Performed at [facility], [address] [place], [place] [address]    Chemistries  Recent Labs  Lab 03/17/19 1511  NA 137  K 4.4  CL 107  CO2 22  GLUCOSE 106*  BUN 28*  CREATININE 0.98  CALCIUM 9.0  AST 19  ALT 16  ALKPHOS 57  BILITOT 1.4*   ------------------------------------------------------------------------------------------------------------------  ------------------------------------------------------------------------------------------------------------------ GFR: CrCl cannot be calculated (Unknown ideal weight.). Liver Function Tests: Recent Labs  Lab 03/17/19 1511  AST 19  ALT 16  ALKPHOS 57  BILITOT 1.4*  PROT 7.1  ALBUMIN 4.1   No results for input(s): LIPASE, AMYLASE in the last 168 hours. No results for input(s): AMMONIA in the last 168 hours. Coagulation Profile: No results for input(s): INR, PROTIME in the last 168 hours. Cardiac Enzymes: No results for input(s): CKTOTAL, CKMB, CKMBINDEX, TROPONINI in the last 168 hours. BNP (last 3 results) No results for input(s): PROBNP in the last 8760 hours. HbA1C: No results for input(s): HGBA1C in the last 72 hours. CBG: No results for input(s): GLUCAP in the last 168 hours. Lipid Profile: No results for input(s): CHOL, HDL, LDLCALC, TRIG, CHOLHDL, LDLDIRECT in the last 72 hours. Thyroid Function Tests: No results for  input(s): TSH, T4TOTAL, FREET4, T3FREE, THYROIDAB in the last 72 hours. Anemia Panel: No results for input(s): VITAMINB12, FOLATE, FERRITIN, TIBC, IRON, RETICCTPCT in the last 72 hours.  --------------------------------------------------------------------------------------------------------------- Urine analysis:    Component Value Date/Time   COLORURINE YELLOW 03/17/2019 1738   APPEARANCEUR HAZY (A) 03/17/2019 1738   LABSPEC 1.017 03/17/2019 1738   PHURINE 5.0 03/17/2019 1738   GLUCOSEU NEGATIVE 03/17/2019 1738   HGBUR MODERATE (A) 03/17/2019 1738   BILIRUBINUR NEGATIVE 03/17/2019 1738   BILIRUBINUR ++ 10/26/2016 1121   KETONESUR NEGATIVE 03/17/2019 1738   PROTEINUR NEGATIVE 03/17/2019 1738   UROBILINOGEN 0.2 05/11/2016 1117   NITRITE POSITIVE (A) 03/17/2019 1738   LEUKOCYTESUR LARGE (A) 03/17/2019 1738      Imaging Results:    Ct Head Wo Contrast  Result Date: 03/17/2019 CLINICAL DATA:  Altered mental status EXAM: CT HEAD WITHOUT CONTRAST TECHNIQUE: Contiguous axial images were obtained from the base of the skull through the vertex without intravenous contrast. COMPARISON:  None February 15, 2017 FINDINGS: Brain: There is moderate diffuse atrophy, stable. There is no appreciable mass, hemorrhage, extra-axial fluid collection, or midline shift. There is evidence of prior infarct at the left frontal-parietal junction which shows evolution and mild extension compared to the previous study. There is evidence of a prior infarct in the right internal capsule/anterior superior thalamus junction. There is patchy small vessel disease in the centra semiovale bilaterally. No acute appearing infarct is demonstrable on this study. Vascular: No hyperdense vessel evident. There is extensive vascular calcification in the distal vertebral arteries, basilar artery, carotid siphon regions, and proximal right middle cerebral arteries. Skull: The bony calvarium appears intact. Sinuses/Orbits: There is  mild mucosal thickening in several ethmoid air cells. Other visualized paranasal sinuses are clear. Visualized orbits appear symmetric bilaterally. Other: Visualized mastoid air cells are clear. IMPRESSION: Stable atrophy. Prior infarct at the left frontal-parietal junction superiorly with evolution and slight extension of infarction in this area compared to the prior study from 2018. Prior infarct at the junction of the right internal capsule and anterior, superior right thalamus. Periventricular small vessel disease evident. No acute infarct is demonstrable. No mass or hemorrhage evident. Extensive arterial vascular calcification noted. Mild mucosal thickening in several ethmoid air cells. Electronically Signed   By: Lowella Grip III M.D.   On: 03/17/2019 16:34   Dg Chest Portable 1 View  Result  Date: 03/17/2019 CLINICAL DATA:  EMS called out to pt's house because pt was "stumbling around, not himself." Family called pcp and they were concerned about possible TIA. History of stroke. EMS says pt talking much but will follow commands. Family says pt talks some days but some days he doesn't. Reports pt has trouble communicating since last stroke. Pt has DNR but family couldn't find paperwork. CBG 122. bp 146/69, hr 70 and 94% on room air. HISTORY OF CANCER, STROKE, CEREBRAL INFARCTION, HTN EXAM: PORTABLE CHEST 1 VIEW COMPARISON:  05/06/2018 FINDINGS: Cardiac silhouette mildly enlarged.  No mediastinal or hilar masses. Lungs are clear.  No convincing pleural effusion.  No pneumothorax. Skeletal structures are demineralized but grossly intact. IMPRESSION: 1. No acute cardiopulmonary disease. 2. Stable cardiomegaly. Electronically Signed   By: Lajean Manes M.D.   On: 03/17/2019 16:01    ekg Afib at 32, LAD, LBBB, no st-t changes c/w ischemia   Assessment & Plan:    Principal Problem:   TIA (transient ischemic attack) Active Problems:   Atrial fibrillation ([place])   Hypothyroidism   History of  stroke   Acute lower UTI   L upper extremity and bilateral lower extremity weakness Secondary to acute lower UTI, r/o TIA/CVA MRI brain Check carotid ultrasound Check cardiac echo Check hga1c, lipid PT/OT/ speech therapy consult NPO til passes RN swallow Aspirin  Cont Eliquis pharmacy to dose Cont Lipitor 20mg  po qhs Please consult neurology in am  Acute lower uti Urine culture Blood culture x2 Rocephin 1gm iv qday  Pafib/hypertension Cont Eliquis pharmacy to dose Cont Toprol XL 25mg  po qday Cont Amlodipine 5mg  po qday  Hypothyroidism Cont Levothyroxine 25 micrograms po qday   DVT Prophylaxis-   Eliquis  AM Labs Ordered, also please review Full Orders  Family Communication: Admission, patients condition and plan of care including tests being ordered have been discussed with the patient  who indicate understanding and agree with the plan and Code Status.  Code Status:  DNR per patient as well as daughter. Notified daughter that patient will be admitted to [facility]  Admission status: Observation: Based on patients clinical presentation and evaluation of above clinical data, I have made determination that patient meets observation criteria at this time.   Time spent in minutes : 70 minutes   Jani Gravel M.D on 03/17/2019 at 8:11 PM

## 2019-03-17 NOTE — ED Triage Notes (Signed)
EMS reports was called out to pt's house because pt was "stumbling around, not himself."  Family called pcp and they were concerned about possible TIA.  History of stroke.  EMS says pt talking much but will follow commands.  Family says pt talks some days but some days he doesn't.  Reports pt has trouble communicating since last stroke.  Pt has DNR but family couldn't find paperwork.  CBG 122.  bp 146/69, hr 70 and 94% on room air.

## 2019-03-17 NOTE — ED Provider Notes (Signed)
[facility] EMERGENCY DEPARTMENT Provider Note   CSN: [id number] Arrival date & time: 03/17/19  1439     History   Chief Complaint Chief Complaint  Patient presents with  . Altered Mental Status    HPI Ethan Faulkner is a 85 y.o. male.     Patient brought in for weakness in his legs and confusion.  Family was concerned that the patient may have had another stroke  The history is provided by a relative. No language interpreter was used.  Altered Mental Status Presenting symptoms: behavior changes   Severity:  Moderate Most recent episode:  Today Episode history:  Continuous Timing:  Constant Progression:  Unchanged Chronicity:  New Context: dementia   Context: not alcohol use     Past Medical History:  Diagnosis Date  . Afib ([place])   . Atrial fibrillation ([place])   . Cerebral infarction ([place])   . DDD (degenerative disc disease), lumbar   . Dysphagia   . Hypertension   . Hypothyroidism   . Migraine    "maybe monthly" (11/21/2015)  . Prostate cancer ([place]) ~ 1995   S/P radiation tx  . Renal mass    "slow growing something; doctors just watching it right now" (11/21/2015)  . Stroke [facility])    TIA  . Thrombocytopenia ([place])   . Thyroid disease    hypothyroid    Patient Active Problem List   Diagnosis Date Noted  . Sepsis ([place]) 05/06/2018  . Hyperbilirubinemia 05/06/2018  . Chronic anemia 05/06/2018  . Hyponatremia 05/06/2018  . Physical deconditioning 05/06/2018  . HTN (hypertension) 05/06/2018  . Hypokalemia 12/08/2017  . Nausea and vomiting   . Ileus ([place])   . Urinary tract infection associated with indwelling urethral catheter ([place])   . Small bowel obstruction ([place]) 12/05/2017  . Acute lower UTI 12/05/2017  . Hyperlipidemia 11/09/2017  . Gross hematuria 04/06/2017  . History of stroke 04/06/2017  . Hematuria, unspecified 02/19/2017  . Cerebrovascular accident (CVA) due to embolism of left middle cerebral artery ([place]) 02/14/2017  . 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 TIA (transient ischemic attack) 05/21/2016  . Leukopenia   . Thrombocytopenia ([place])   . Fall   . Acute blood loss anemia   . Femoral neck fracture ([place]) 11/20/2015  . Renal malignant tumor ([place]) 06/06/2015  . Long term current use of anticoagulant therapy 04/29/2015  . Hypothyroidism 03/01/2014  . Atrial fibrillation ([place]) 06/09/2013  . STRESS FRACTURE, FOOT 12/12/2008  . CLOSED FRACTURE OF METATARSAL BONE 12/12/2008    Past Surgical History:  Procedure Laterality Date  . COLONOSCOPY  1999  . ESOPHAGOGASTRODUODENOSCOPY    . LAPAROSCOPIC CHOLECYSTECTOMY  1980s  . PROSTATE BIOPSY  ~ 1995  . TOTAL HIP ARTHROPLASTY Left 11/22/2015   Procedure: LEFT HIP HEMIARTHROPLASTY ;  Surgeon: Leandrew Koyanagi, MD;  Location: [place];  Service: Orthopedics;  Laterality: Left;        Home Medications    Prior to Admission medications   Medication Sig Start Date End Date Taking? Authorizing Provider  amLODipine (NORVASC) 5 MG tablet TAKE (1) TABLET BY MOUTH ONCE DAILY. 02/28/19  Yes Luking, Elayne Snare, MD  ampicillin (PRINCIPEN) 500 MG capsule Take 500 mg by mouth daily. 03/07/19  Yes [provider]  atorvastatin (LIPITOR) 20 MG tablet TAKE 1 TABLET BY MOUTH ONCE DAILY AT 6:00 PM. 02/09/19  Yes Luking, Elayne Snare, MD  cetirizine (ZYRTEC) 10 MG tablet Take 10 mg by mouth every evening.  Yes [provider]  ELIQUIS 2.5 MG TABS tablet TAKE (1) TABLET BY MOUTH TWICE DAILY 9AM AND 9PM. 02/09/19  Yes Luking, Elayne Snare, MD  levothyroxine (SYNTHROID) 25 MCG tablet TAKE 1 TABLET EVERY MORNING ON AN EMPTY STOMACH FOR THYROID. 02/09/19  Yes Luking, Scott A, MD  metoprolol succinate (TOPROL-XL) 25 MG 24 hr tablet TAKE (1) TABLET BY MOUTH ONCE DAILY. 02/28/19  Yes Luking, Elayne Snare, MD  QC NATURA-LAX 17 GM/SCOOP powder MIX 1 CAPFUL IN 8 OZ OF WATER AND DRINK TWICE WEEKLY. 12/06/18  Yes Luking, Elayne Snare, MD  traMADol (ULTRAM) 50 MG tablet TAKE ONE TABLET 3  TIMES DAILY AS NEEDED FOR SEVERE PAIN. 02/28/19  Yes Luking, Elayne Snare, MD  azithromycin (ZITHROMAX Z-PAK) 250 MG tablet Take 2 tablets (500 mg) on  Day 1,  followed by 1 tablet (250 mg) once daily on Days 2 through 5. 03/01/19   Luking, Scott A, MD  docusate sodium (COLACE) 100 MG capsule Take 100 mg by mouth daily.    [provider]  ketoconazole (NIZORAL) 2 % cream APPLY TO AFFECTED AREAS TWICE DAILY. Patient not taking: Reported on 03/17/2019 10/19/18   Kathyrn Drown, MD    Family History Family History  Problem Relation Age of Onset  . Stroke Sister        in her 69s  . CAD Neg Hx   . COPD Neg Hx   . Diabetes Neg Hx   . Heart disease Neg Hx     Social History Social History   Tobacco Use  . Smoking status: Former Research scientist (life sciences)  . Smokeless tobacco: Never Used  . Tobacco comment: 11/21/2015 "might have smoked 2 packs of cigarettes in my lifetime"  Substance Use Topics  . Alcohol use: Yes    Comment: 11/21/2015 "last beer I've had was in ~ 2007"  . Drug use: No     Allergies   Xanax [alprazolam] and Tape   Review of Systems Review of Systems  Unable to perform ROS: Mental status change     Physical Exam Updated Vital Signs BP (!) 145/74   Pulse 78   Resp 17   SpO2 95%   Physical Exam Vitals signs and nursing note reviewed.  Constitutional:      Appearance: He is well-developed.  HENT:     Head: Normocephalic.     Nose: Nose normal.  Eyes:     General: No scleral icterus.    Conjunctiva/sclera: Conjunctivae normal.  Neck:     Musculoskeletal: Neck supple.     Thyroid: No thyromegaly.  Cardiovascular:     Rate and Rhythm: Normal rate and regular rhythm.     Heart sounds: No murmur. No friction rub. No gallop.   Pulmonary:     Breath sounds: No stridor. No wheezing or rales.  Chest:     Chest wall: No tenderness.  Abdominal:     General: There is no distension.     Tenderness: There is no abdominal tenderness. There is no rebound.  Genitourinary:     Comments: Patient has a Foley catheter Musculoskeletal: Normal range of motion.     Comments: Moderate weakness in both legs.  Lymphadenopathy:     Cervical: No cervical adenopathy.  Skin:    Findings: No erythema or rash.  Neurological:     Mental Status: He is alert.     Motor: No abnormal muscle tone.     Coordination: Coordination normal.     Comments: Oriented to person  Psychiatric:        Behavior: Behavior normal.      ED Treatments / Results  Labs (all labs ordered are listed, but only abnormal results are displayed) Labs Reviewed  COMPREHENSIVE METABOLIC PANEL - Abnormal; Notable for the following components:      Result Value   Glucose, Bld 106 (*)    BUN 28 (*)    Total Bilirubin 1.4 (*)    All other components within normal limits  URINALYSIS, ROUTINE W REFLEX MICROSCOPIC - Abnormal; Notable for the following components:   APPearance HAZY (*)    Hgb urine dipstick MODERATE (*)    Nitrite POSITIVE (*)    Leukocytes,Ua LARGE (*)    WBC, UA >50 (*)    Bacteria, UA RARE (*)    All other components within normal limits  URINE CULTURE  SARS CORONAVIRUS 2 (TAT 6-24 HRS)  CBC WITH DIFFERENTIAL/PLATELET    EKG None  Radiology Ct Head Wo Contrast  Result Date: 03/17/2019 CLINICAL DATA:  Altered mental status EXAM: CT HEAD WITHOUT CONTRAST TECHNIQUE: Contiguous axial images were obtained from the base of the skull through the vertex without intravenous contrast. COMPARISON:  None February 15, 2017 FINDINGS: Brain: There is moderate diffuse atrophy, stable. There is no appreciable mass, hemorrhage, extra-axial fluid collection, or midline shift. There is evidence of prior infarct at the left frontal-parietal junction which shows evolution and mild extension compared to the previous study. There is evidence of a prior infarct in the right internal capsule/anterior superior thalamus junction. There is patchy small vessel disease in the centra semiovale bilaterally.  No acute appearing infarct is demonstrable on this study. Vascular: No hyperdense vessel evident. There is extensive vascular calcification in the distal vertebral arteries, basilar artery, carotid siphon regions, and proximal right middle cerebral arteries. Skull: The bony calvarium appears intact. Sinuses/Orbits: There is mild mucosal thickening in several ethmoid air cells. Other visualized paranasal sinuses are clear. Visualized orbits appear symmetric bilaterally. Other: Visualized mastoid air cells are clear. IMPRESSION: Stable atrophy. Prior infarct at the left frontal-parietal junction superiorly with evolution and slight extension of infarction in this area compared to the prior study from 2018. Prior infarct at the junction of the right internal capsule and anterior, superior right thalamus. Periventricular small vessel disease evident. No acute infarct is demonstrable. No mass or hemorrhage evident. Extensive arterial vascular calcification noted. Mild mucosal thickening in several ethmoid air cells. Electronically Signed   By: Lowella Grip III M.D.   On: 03/17/2019 16:34   Dg Chest Portable 1 View  Result Date: 03/17/2019 CLINICAL DATA:  EMS called out to pt's house because pt was "stumbling around, not himself." Family called pcp and they were concerned about possible TIA. History of stroke. EMS says pt talking much but will follow commands. Family says pt talks some days but some days he doesn't. Reports pt has trouble communicating since last stroke. Pt has DNR but family couldn't find paperwork. CBG 122. bp 146/69, hr 70 and 94% on room air. HISTORY OF CANCER, STROKE, CEREBRAL INFARCTION, HTN EXAM: PORTABLE CHEST 1 VIEW COMPARISON:  05/06/2018 FINDINGS: Cardiac silhouette mildly enlarged.  No mediastinal or hilar masses. Lungs are clear.  No convincing pleural effusion.  No pneumothorax. Skeletal structures are demineralized but grossly intact. IMPRESSION: 1. No acute cardiopulmonary  disease. 2. Stable cardiomegaly. Electronically Signed   By: Lajean Manes M.D.   On: 03/17/2019 16:01    Procedures Procedures (including critical care time)  Medications Ordered in ED  Medications  ciprofloxacin (CIPRO) IVPB 400 mg (400 mg Intravenous New Bag/Given 03/17/19 1929)     Initial Impression / Assessment and Plan / ED Course  I have reviewed the triage vital signs and the nursing notes.  Pertinent labs & imaging results that were available during my care of the patient were reviewed by me and considered in my medical decision making (see chart for details).        Patient with increased confusion and increase weakness in both legs.  CT scan does not show an acute stroke.  Urinalysis shows UTI.  Patient's weakness and confusion could be related to urinary tract infection.  He will be admitted and placed on antibiotics.  It is also possible he had another stroke that has not shown up on CT scan yet Final Clinical Impressions(s) / ED Diagnoses   Final diagnoses:  Acute cystitis with hematuria    ED Discharge Orders    None       Milton Ferguson, MD 03/17/19 1933

## 2019-03-18 ENCOUNTER — Observation Stay (HOSPITAL_COMMUNITY): Payer: Medicare Other

## 2019-03-18 ENCOUNTER — Inpatient Hospital Stay (HOSPITAL_COMMUNITY): Payer: Medicare Other

## 2019-03-18 DIAGNOSIS — E785 Hyperlipidemia, unspecified: Secondary | ICD-10-CM | POA: Diagnosis present

## 2019-03-18 DIAGNOSIS — I480 Paroxysmal atrial fibrillation: Secondary | ICD-10-CM

## 2019-03-18 DIAGNOSIS — N3001 Acute cystitis with hematuria: Secondary | ICD-10-CM | POA: Diagnosis not present

## 2019-03-18 DIAGNOSIS — Z20828 Contact with and (suspected) exposure to other viral communicable diseases: Secondary | ICD-10-CM | POA: Diagnosis present

## 2019-03-18 DIAGNOSIS — Z96642 Presence of left artificial hip joint: Secondary | ICD-10-CM | POA: Diagnosis present

## 2019-03-18 DIAGNOSIS — R29704 NIHSS score 4: Secondary | ICD-10-CM | POA: Diagnosis present

## 2019-03-18 DIAGNOSIS — I6340 Cerebral infarction due to embolism of unspecified cerebral artery: Secondary | ICD-10-CM | POA: Diagnosis present

## 2019-03-18 DIAGNOSIS — I340 Nonrheumatic mitral (valve) insufficiency: Secondary | ICD-10-CM

## 2019-03-18 DIAGNOSIS — Z79899 Other long term (current) drug therapy: Secondary | ICD-10-CM | POA: Diagnosis not present

## 2019-03-18 DIAGNOSIS — D696 Thrombocytopenia, unspecified: Secondary | ICD-10-CM

## 2019-03-18 DIAGNOSIS — Z7901 Long term (current) use of anticoagulants: Secondary | ICD-10-CM | POA: Diagnosis not present

## 2019-03-18 DIAGNOSIS — Z79890 Hormone replacement therapy (postmenopausal): Secondary | ICD-10-CM | POA: Diagnosis not present

## 2019-03-18 DIAGNOSIS — Z823 Family history of stroke: Secondary | ICD-10-CM | POA: Diagnosis not present

## 2019-03-18 DIAGNOSIS — Z66 Do not resuscitate: Secondary | ICD-10-CM | POA: Diagnosis present

## 2019-03-18 DIAGNOSIS — I69322 Dysarthria following cerebral infarction: Secondary | ICD-10-CM | POA: Diagnosis not present

## 2019-03-18 DIAGNOSIS — I361 Nonrheumatic tricuspid (valve) insufficiency: Secondary | ICD-10-CM

## 2019-03-18 DIAGNOSIS — I10 Essential (primary) hypertension: Secondary | ICD-10-CM | POA: Diagnosis not present

## 2019-03-18 DIAGNOSIS — Y846 Urinary catheterization as the cause of abnormal reaction of the patient, or of later complication, without mention of misadventure at the time of the procedure: Secondary | ICD-10-CM | POA: Diagnosis present

## 2019-03-18 DIAGNOSIS — I6623 Occlusion and stenosis of bilateral posterior cerebral arteries: Secondary | ICD-10-CM | POA: Diagnosis not present

## 2019-03-18 DIAGNOSIS — Z87891 Personal history of nicotine dependence: Secondary | ICD-10-CM | POA: Diagnosis not present

## 2019-03-18 DIAGNOSIS — T83518A Infection and inflammatory reaction due to other urinary catheter, initial encounter: Secondary | ICD-10-CM | POA: Diagnosis present

## 2019-03-18 DIAGNOSIS — E039 Hypothyroidism, unspecified: Secondary | ICD-10-CM | POA: Diagnosis present

## 2019-03-18 DIAGNOSIS — Z8546 Personal history of malignant neoplasm of prostate: Secondary | ICD-10-CM | POA: Diagnosis not present

## 2019-03-18 DIAGNOSIS — I739 Peripheral vascular disease, unspecified: Secondary | ICD-10-CM | POA: Diagnosis present

## 2019-03-18 DIAGNOSIS — G8194 Hemiplegia, unspecified affecting left nondominant side: Secondary | ICD-10-CM | POA: Diagnosis not present

## 2019-03-18 DIAGNOSIS — Z923 Personal history of irradiation: Secondary | ICD-10-CM | POA: Diagnosis not present

## 2019-03-18 DIAGNOSIS — R4020 Unspecified coma: Secondary | ICD-10-CM | POA: Diagnosis not present

## 2019-03-18 DIAGNOSIS — R4182 Altered mental status, unspecified: Secondary | ICD-10-CM | POA: Diagnosis not present

## 2019-03-18 DIAGNOSIS — F0150 Vascular dementia without behavioral disturbance: Secondary | ICD-10-CM | POA: Diagnosis present

## 2019-03-18 DIAGNOSIS — N390 Urinary tract infection, site not specified: Secondary | ICD-10-CM | POA: Diagnosis present

## 2019-03-18 DIAGNOSIS — I6523 Occlusion and stenosis of bilateral carotid arteries: Secondary | ICD-10-CM | POA: Diagnosis not present

## 2019-03-18 DIAGNOSIS — I639 Cerebral infarction, unspecified: Secondary | ICD-10-CM | POA: Diagnosis not present

## 2019-03-18 DIAGNOSIS — F0280 Dementia in other diseases classified elsewhere without behavioral disturbance: Secondary | ICD-10-CM | POA: Diagnosis present

## 2019-03-18 LAB — CBC
HCT: 40.4 % (ref 39.0–52.0)
Hemoglobin: 12.9 g/dL — ABNORMAL LOW (ref 13.0–17.0)
MCH: 31.1 pg (ref 26.0–34.0)
MCHC: 31.9 g/dL (ref 30.0–36.0)
MCV: 97.3 fL (ref 80.0–100.0)
Platelets: 144 10*3/uL — ABNORMAL LOW (ref 150–400)
RBC: 4.15 MIL/uL — ABNORMAL LOW (ref 4.22–5.81)
RDW: 13.8 % (ref 11.5–15.5)
WBC: 4.9 10*3/uL (ref 4.0–10.5)
nRBC: 0 % (ref 0.0–0.2)

## 2019-03-18 LAB — LIPID PANEL
Cholesterol: 103 mg/dL (ref 0–200)
HDL: 43 mg/dL (ref >40)
LDL Cholesterol: 50 mg/dL (ref 0–99)
Total CHOL/HDL Ratio: 2.4 RATIO
Triglycerides: 51 mg/dL (ref <150)
VLDL: 10 mg/dL (ref 0–40)

## 2019-03-18 LAB — COMPREHENSIVE METABOLIC PANEL
ALT: 14 U/L (ref 0–44)
AST: 16 U/L (ref 15–41)
Albumin: 3.8 g/dL (ref 3.5–5.0)
Alkaline Phosphatase: 54 U/L (ref 38–126)
Anion gap: 10 (ref 5–15)
BUN: 25 mg/dL — ABNORMAL HIGH (ref 8–23)
CO2: 22 mmol/L (ref 22–32)
Calcium: 9 mg/dL (ref 8.9–10.3)
Chloride: 107 mmol/L (ref 98–111)
Creatinine, Ser: 0.74 mg/dL (ref 0.61–1.24)
GFR calc Af Amer: >60 mL/min (ref >60)
GFR calc non Af Amer: >60 mL/min (ref >60)
Glucose, Bld: 94 mg/dL (ref 70–99)
Potassium: 3.9 mmol/L (ref 3.5–5.1)
Sodium: 139 mmol/L (ref 135–145)
Total Bilirubin: 1.5 mg/dL — ABNORMAL HIGH (ref 0.3–1.2)
Total Protein: 6.5 g/dL (ref 6.5–8.1)

## 2019-03-18 LAB — HEMOGLOBIN A1C
Hgb A1c MFr Bld: 5.6 % (ref 4.8–5.6)
Mean Plasma Glucose: 114.02 mg/dL

## 2019-03-18 LAB — SARS CORONAVIRUS 2 (TAT 6-24 HRS): SARS Coronavirus 2: NEGATIVE

## 2019-03-18 LAB — ECHOCARDIOGRAM COMPLETE

## 2019-03-18 MED ORDER — PROMETHAZINE HCL 25 MG/ML IJ SOLN: 12.5000 mg | Freq: Four times a day (QID) | INTRAMUSCULAR | Status: DC | PRN

## 2019-03-18 MED ORDER — CHLORHEXIDINE GLUCONATE CLOTH 2 % EX PADS
6.0000 | MEDICATED_PAD | Freq: Every day | CUTANEOUS | Status: DC
  Administered 2019-03-19 – 2019-03-21 (×3): 6 via TOPICAL

## 2019-03-18 MED ORDER — ONDANSETRON HCL 4 MG/2ML IJ SOLN
4.0000 mg | Freq: Four times a day (QID) | INTRAMUSCULAR | Status: DC | PRN
  Administered 2019-03-18: 4 mg via INTRAVENOUS
  Filled 2019-03-18: qty 2

## 2019-03-18 NOTE — Progress Notes (Signed)
New Admission Note:  Arrival Method: [facility] via stretcher from [facility] ED Mental Orientation: patient is alert and oriented Telemetry: Yes Assessment: Completed Skin: Bilateral bruising on upper extremities IV: Left forearm that is saline locked Pain: No complaints of pain Safety Measures: Safety Fall Prevention Plan was given, discussed. Admission: Completed 3W: Patient has been orientated to the room, unit and the staff. Family: patient's niece is at the bedside and staying the night with the patient.  Orders have been reviewed and implemented. Will continue to monitor the patient. Call light has been placed within reach and bed alarm has been activated.   Janus Molder ,RN

## 2019-03-18 NOTE — Plan of Care (Signed)
  Problem: Acute Rehab PT Goals(only PT should resolve) Goal: Pt Will Go Supine/Side To Sit Outcome: Progressing Flowsheets (Taken 03/18/2019 1419) Pt will go Supine/Side to Sit: with min guard assist Goal: Patient Will Transfer Sit To/From Stand Outcome: Progressing Flowsheets (Taken 03/18/2019 1419) Patient will transfer sit to/from stand: with min guard assist Goal: Pt Will Transfer Bed To Chair/Chair To Bed Outcome: Progressing Flowsheets (Taken 03/18/2019 1419) Pt will Transfer Bed to Chair/Chair to Bed: min guard assist Goal: Pt Will Ambulate Outcome: Progressing Flowsheets (Taken 03/18/2019 1419) Pt will Ambulate:  100 feet  with min guard assist  with minimal assist  with rolling walker   2:19 PM, 03/18/19 Lonell Grandchild, MPT Physical Therapist with [facility] [phone] office [phone] mobile phone

## 2019-03-18 NOTE — Progress Notes (Signed)
*  PRELIMINARY RESULTS* Echocardiogram 2D Echocardiogram has been performed.  Leavy Cella 03/18/2019, 9:03 AM

## 2019-03-18 NOTE — ED Notes (Signed)
Report called to Levada Dy, RN at [facility]. Will call [phone] when EMS arrives for transport to let them know pt has left our facility.

## 2019-03-18 NOTE — Progress Notes (Addendum)
PROGRESS NOTE  Ethan Faulkner [id number] DOB: 1916/01/10 DOA: 03/17/2019 PCP: Kathyrn Drown, MD  Brief History:  85 year old male with a history of hypothyroidism, hypertension, hyperlipidemia, paroxysmal atrial fibrillation, previous stroke with dysarthria, prostate cancer presenting with left upper extremity weakness, leg weakness, and gait instability.  The patient has some difficulty providing history secondary to his previous stroke.  History is obtained from speaking with the patient's daughters at the bedside.  When the patient woke up in the a.m. on 03/17/2019, the patient had much difficulty getting out of bed due to increasing weakness.  This was unusual as he normally does not require maximal assistance.  In addition, he required maximal assistance getting to the bathroom using his walker.  Once again this is not his usual baseline.  As the day progressed, his caretakers noticed that he may have been neglecting his left side and not using his left arm/hand as much.  There is been no complaints of fevers, chills, chest pain, shortness breath, coughing, hemoptysis, nausea, vomiting, diarrhea.  In the past week, daughter states that he has not been eating as good as usual without any agitation or worsening confusion.  Notably, the patient has an indwelling Foley catheter.  It was last changed approximately 2-1/2 weeks prior to this admission. In the emergency department, the patient was afebrile hemodynamically stable with oxygen saturation 95-96% on room air.  CT of the brain was negative for acute findings per chest x-ray was negative.  BMP and CBC were essentially unremarkable except for some thrombocytopenia which is chronic for the patient.  UA showed > 50 WBC.  Because of concerns for another stroke, transfer to [facility] was being advised for MRI and neurologic evaluation.  Assessment/Plan: Leg weakness/left upper extremity weakness/neglect -Concern for possible stroke  --Neurology Consult -PT/OT evaluation -Speech therapy eval -CT brain--neg for acute findings -MRI brain-- -MRA brain-- -Carotid Duplex--neg for hemodynamically significant stenosis -Echo-- -LDL--50 -HbA1C--5.6 -Antiplatelet--ASA 325 mg -remains significantly weak with poor po intake and inability to care for self.  Family now with difficulty caring for patient's ADLs as his function state has significantly declined from his baseline.  Pyuria -Concerning for CAUTI -Continue ceftriaxone pending culture data  Paroxysmal atrial fibrillation -Rate controlled -Continue metoprolol succinate -Holding apixaban pending MRI results and neurology evaluation  Hyperlipidemia -Continue statin -LDL 50  Essential hypertension -Holding amlodipine to allow for permissive hypertension -Start metoprolol succinate for now to avoid atrial fibrillation with RVR  Thrombocytopenia -chronic -serial CBC -monitor for bleeding      Disposition Plan:   Transfer to [facility] Family Communication:   Daughters updated at bedside 10/24  Consultants:  neurology  Code Status:  FULL   DVT Prophylaxis:  apixaban on hold   Procedures: As Listed in Progress Note Above  Antibiotics: Ceftriaxone 10/24>>> cipro 10/23    Total time spent 35 minutes.  Greater than 50% spent face to face counseling and coordinating care.    Subjective: Patient denies fevers, chills, headache, chest pain, dyspnea, nausea, vomiting, diarrhea, abdominal pain, dysuria,    Objective: Vitals:   03/18/19 0400 03/18/19 0430 03/18/19 0500 03/18/19 0600  BP: (!) 135/94 131/88 138/70 (!) 142/66  Pulse: 74 74 74 78  Resp: 18 (!) 22 16 18   SpO2: 94% 92% 94% 94%    Intake/Output Summary (Last 24 hours) at 03/18/2019 0749 Last data filed at 03/17/2019 2112 Gross per 24 hour  Intake 250.71 ml  Output 400 ml  Net -149.29 ml   Weight change:  Exam:   General:  Pt is alert, follows commands appropriately, not in acute  distress  HEENT: No icterus, No thrush, No neck mass, [place]/AT  Cardiovascular: RRR, S1/S2, no rubs, no gallops  Respiratory: bibasilar crackle  Abdomen: Soft/+BS, non tender, non distended, no guarding  Extremities: No edema, No lymphangitis, No petechiae, No rashes, no synovitis   Data Reviewed: I have personally reviewed following labs and imaging studies Basic Metabolic Panel: Recent Labs  Lab 03/17/19 1511 03/18/19 0538  NA 137 139  K 4.4 3.9  CL 107 107  CO2 22 22  GLUCOSE 106* 94  BUN 28* 25*  CREATININE 0.98 0.74  CALCIUM 9.0 9.0   Liver Function Tests: Recent Labs  Lab 03/17/19 1511 03/18/19 0538  AST 19 16  ALT 16 14  ALKPHOS 57 54  BILITOT 1.4* 1.5*  PROT 7.1 6.5  ALBUMIN 4.1 3.8   No results for input(s): LIPASE, AMYLASE in the last 168 hours. No results for input(s): AMMONIA in the last 168 hours. Coagulation Profile: No results for input(s): INR, PROTIME in the last 168 hours. CBC: Recent Labs  Lab 03/17/19 1511 03/18/19 0538  WBC 4.6 4.9  NEUTROABS 2.6  --   HGB 13.1 12.9*  HCT 41.6 40.4  MCV 97.9 97.3  PLT 164 144*   Cardiac Enzymes: No results for input(s): CKTOTAL, CKMB, CKMBINDEX, TROPONINI in the last 168 hours. BNP: Invalid input(s): POCBNP CBG: No results for input(s): GLUCAP in the last 168 hours. HbA1C: No results for input(s): HGBA1C in the last 72 hours. Urine analysis:    Component Value Date/Time   COLORURINE YELLOW 03/17/2019 1738   APPEARANCEUR HAZY (A) 03/17/2019 1738   LABSPEC 1.017 03/17/2019 1738   PHURINE 5.0 03/17/2019 1738   GLUCOSEU NEGATIVE 03/17/2019 1738   HGBUR MODERATE (A) 03/17/2019 1738   BILIRUBINUR NEGATIVE 03/17/2019 1738   BILIRUBINUR ++ 10/26/2016 1121   KETONESUR NEGATIVE 03/17/2019 1738   PROTEINUR NEGATIVE 03/17/2019 1738   UROBILINOGEN 0.2 05/11/2016 1117   NITRITE POSITIVE (A) 03/17/2019 1738   LEUKOCYTESUR LARGE (A) 03/17/2019 1738   Sepsis Labs:  @LABRCNTIP (procalcitonin:4,lacticidven:4) ) Recent Results (from the past 240 hour(s))  Culture, blood (Routine X 2) w Reflex to ID Panel     Status: None (Preliminary result)   Collection Time: 03/17/19  8:14 PM   Specimen: Right Antecubital; Blood  Result Value Ref Range Status   Specimen Description RIGHT ANTECUBITAL  Final   Special Requests   Final    BOTTLES DRAWN AEROBIC AND ANAEROBIC Blood Culture adequate volume   Culture   Final    NO GROWTH < 12 HOURS Performed at [facility], [address], [place], [place] [address]    Report Status PENDING  Incomplete  Culture, blood (Routine X 2) w Reflex to ID Panel     Status: None (Preliminary result)   Collection Time: 03/17/19  8:15 PM   Specimen: Left Antecubital; Blood  Result Value Ref Range Status   Specimen Description LEFT ANTECUBITAL  Final   Special Requests   Final    BOTTLES DRAWN AEROBIC AND ANAEROBIC Blood Culture adequate volume   Culture   Final    NO GROWTH < 12 HOURS Performed at [facility], [address], [place], [place] [address]    Report Status PENDING  Incomplete     Scheduled Meds: .  stroke: mapping our early stages of recovery book   Does not apply Once  .  amLODipine  5 mg Oral Daily  . apixaban  2.5 mg Oral BID  . aspirin  300 mg Rectal Daily   Or  . aspirin  325 mg Oral Daily  . atorvastatin  20 mg Oral q1800  . docusate sodium  100 mg Oral Daily  . levothyroxine  25 mcg Oral Q0600  . loratadine  10 mg Oral Daily  . metoprolol succinate  25 mg Oral Daily   Continuous Infusions: . cefTRIAXone (ROCEPHIN)  IV Stopped (03/17/19 2206)    Procedures/Studies: Ct Head Wo Contrast  Result Date: 03/17/2019 CLINICAL DATA:  Altered mental status EXAM: CT HEAD WITHOUT CONTRAST TECHNIQUE: Contiguous axial images were obtained from the base of the skull through the vertex without intravenous contrast. COMPARISON:  None February 15, 2017 FINDINGS: Brain: There is moderate diffuse atrophy, stable.  There is no appreciable mass, hemorrhage, extra-axial fluid collection, or midline shift. There is evidence of prior infarct at the left frontal-parietal junction which shows evolution and mild extension compared to the previous study. There is evidence of a prior infarct in the right internal capsule/anterior superior thalamus junction. There is patchy small vessel disease in the centra semiovale bilaterally. No acute appearing infarct is demonstrable on this study. Vascular: No hyperdense vessel evident. There is extensive vascular calcification in the distal vertebral arteries, basilar artery, carotid siphon regions, and proximal right middle cerebral arteries. Skull: The bony calvarium appears intact. Sinuses/Orbits: There is mild mucosal thickening in several ethmoid air cells. Other visualized paranasal sinuses are clear. Visualized orbits appear symmetric bilaterally. Other: Visualized mastoid air cells are clear. IMPRESSION: Stable atrophy. Prior infarct at the left frontal-parietal junction superiorly with evolution and slight extension of infarction in this area compared to the prior study from 2018. Prior infarct at the junction of the right internal capsule and anterior, superior right thalamus. Periventricular small vessel disease evident. No acute infarct is demonstrable. No mass or hemorrhage evident. Extensive arterial vascular calcification noted. Mild mucosal thickening in several ethmoid air cells. Electronically Signed   By: Lowella Grip III M.D.   On: 03/17/2019 16:34   Dg Chest Portable 1 View  Result Date: 03/17/2019 CLINICAL DATA:  EMS called out to pt's house because pt was "stumbling around, not himself." Family called pcp and they were concerned about possible TIA. History of stroke. EMS says pt talking much but will follow commands. Family says pt talks some days but some days he doesn't. Reports pt has trouble communicating since last stroke. Pt has DNR but family couldn't find  paperwork. CBG 122. bp 146/69, hr 70 and 94% on room air. HISTORY OF CANCER, STROKE, CEREBRAL INFARCTION, HTN EXAM: PORTABLE CHEST 1 VIEW COMPARISON:  05/06/2018 FINDINGS: Cardiac silhouette mildly enlarged.  No mediastinal or hilar masses. Lungs are clear.  No convincing pleural effusion.  No pneumothorax. Skeletal structures are demineralized but grossly intact. IMPRESSION: 1. No acute cardiopulmonary disease. 2. Stable cardiomegaly. Electronically Signed   By: Lajean Manes M.D.   On: 03/17/2019 16:01    Orson Eva, DO  [facility] Pager [phone]  If 7PM-7AM, please contact night-coverage [email or web address] Password [email or web address] 03/18/2019, 7:49 AM   LOS: 0 days

## 2019-03-18 NOTE — ED Notes (Signed)
Patient given meal tray.

## 2019-03-18 NOTE — Evaluation (Signed)
Physical Therapy Evaluation Patient Details Name: Ethan Faulkner MRN: [id number] DOB: 01/10/1916 Today's Date: 03/18/2019   History of Present Illness  Ethan Faulkner  is a 85 y.o. male, w hypothyroidism, hypertension, hyperlipidemia, Pafib, h/o stroke w baseline slurred speech, presents with weakness of the left arm starting at 9am this morning per family.  They were concerned because he was not able to walk with his walker as he typically does at baseline. Concern was for stroke.  Pt presented outside the TPA window.     Clinical Impression  Patient functioning near/at baseline for functional mobility and gait, other than new weakness LLE resulting in limited ankle dorsiflexion and excessive internal rotation with occasional scissoring of LLE.  Patient able to ambulate in hallway without loss of balance, but had to take a sitting rest break before returning to room and tolerated sitting up in chair with his caregiver present.  Patient will benefit from continued physical therapy in hospital and recommended venue below to increase strength, balance, endurance for safe ADLs and gait.  HOSPITAL BED RECOMMENDATION: Patient suffers from history of CVA with limited ability to perform daily activities like prolonged walking, standing and getting in/out of a regular bed in the home.  Patient will have difficulty sitting up from and getting into/out of and sleeping in a regular bed due residual weakness from CVA.  Patient will benefit from a bed that can have height adjusted from floor to allow patient to complete sit to stands safely and has side rails to increase safety when rolling, and adjustable head and foot positions in order for patient to sleep in most comfortable position possible.      Follow Up Recommendations Home health PT;Supervision/Assistance - 24 hour;Supervision for mobility/OOB    Equipment Recommendations  Hospital bed    Recommendations for Other Services       Precautions  / Restrictions Precautions Precautions: Fall Restrictions Weight Bearing Restrictions: No      Mobility  Bed Mobility Overal bed mobility: Needs Assistance Bed Mobility: Supine to Sit     Supine to sit: Min assist     General bed mobility comments: slow labored movement  Transfers Overall transfer level: Needs assistance Equipment used: Rolling walker (2 wheeled) Transfers: Sit to/from [facility] Sit to Stand: Min assist Stand pivot transfers: Min assist       General transfer comment: increased time, labored movement  Ambulation/Gait Ambulation/Gait assistance: Min assist Gait Distance (Feet): 80 Feet Assistive device: Rolling walker (2 wheeled) Gait Pattern/deviations: Decreased step length - right;Decreased step length - left;Decreased stance time - left;Decreased dorsiflexion - left;Scissoring Gait velocity: decreased   General Gait Details: slow labored cadence with excessive internal rotation LLE with decreased dorsilflexion, limited secondary to fatigue  Stairs            Wheelchair Mobility    Modified Rankin (Stroke Patients Only)       Balance Overall balance assessment: Needs assistance Sitting-balance support: No upper extremity supported;Feet supported Sitting balance-Leahy Scale: Fair Sitting balance - Comments: seated at bedside   Standing balance support: Bilateral upper extremity supported;During functional activity Standing balance-Leahy Scale: Fair Standing balance comment: using RW                             Pertinent Vitals/Pain Pain Assessment: No/denies pain    Home Living Family/patient expects to be discharged to:: Private residence Living Arrangements: Non-relatives/Friends Available Help at Discharge: Personal care attendant;Available  24 hours/day Type of Home: House Home Access: Stairs to enter Entrance Stairs-Rails: None Entrance Stairs-Number of Steps: 1 Home Layout: One level Home  Equipment: Walker - 2 wheels;Bedside commode;Shower seat;Wheelchair - manual      Prior Function Level of Independence: Needs assistance   Gait / Transfers Assistance Needed: household ambulator with assistance  ADL's / Homemaking Assistance Needed: assisted by paid attendents        Hand Dominance        Extremity/Trunk Assessment   Upper Extremity Assessment Upper Extremity Assessment: Defer to OT evaluation    Lower Extremity Assessment Lower Extremity Assessment: Generalized weakness;RLE deficits/detail;LLE deficits/detail RLE Deficits / Details: grossly 4+/5 RLE Sensation: WNL RLE Coordination: WNL LLE Deficits / Details: grossly -4/5 except ankle dorsiflexion -3/5 LLE Sensation: WNL LLE Coordination: WNL    Cervical / Trunk Assessment Cervical / Trunk Assessment: Normal  Communication   Communication: Receptive difficulties  Cognition Arousal/Alertness: Awake/alert Behavior During Therapy: WFL for tasks assessed/performed Overall Cognitive Status: History of cognitive impairments - at baseline                                        General Comments      Exercises     Assessment/Plan    PT Assessment Patient needs continued PT services  PT Problem List         PT Treatment Interventions Gait training;Stair training;Functional mobility training;Therapeutic activities;Therapeutic exercise;Neuromuscular re-education;Patient/family education    PT Goals (Current goals can be found in the Care Plan section)  Acute Rehab PT Goals Patient Stated Goal: return home PT Goal Formulation: With patient(caregiver present) Time For Goal Achievement: 03/21/19 Potential to Achieve Goals: Good    Frequency Min 5X/week   Barriers to discharge        Co-evaluation               AM-PAC PT "6 Clicks" Mobility  Outcome Measure Help needed turning from your back to your side while in a flat bed without using bedrails?: A Little Help needed  moving from lying on your back to sitting on the side of a flat bed without using bedrails?: A Little Help needed moving to and from a bed to a chair (including a wheelchair)?: A Little Help needed standing up from a chair using your arms (e.g., wheelchair or bedside chair)?: A Little Help needed to walk in hospital room?: A Lot Help needed climbing 3-5 steps with a railing? : A Lot 6 Click Score: 16    End of Session   Activity Tolerance: Patient tolerated treatment well;Patient limited by fatigue Patient left: in chair;with call bell/phone within reach;with family/visitor present Nurse Communication: Mobility status PT Visit Diagnosis: Unsteadiness on feet (R26.81);Other abnormalities of gait and mobility (R26.89);Muscle weakness (generalized) (M62.81)    Time: [id number] PT Time Calculation (min) (ACUTE ONLY): 29 min   Charges:   PT Evaluation $PT Eval Moderate Complexity: 1 Mod PT Treatments $Therapeutic Activity: 23-37 mins        2:17 PM, 03/18/19 Lonell Grandchild, MPT Physical Therapist with [facility] [phone] office [phone] mobile phone

## 2019-03-19 ENCOUNTER — Other Ambulatory Visit: Payer: Self-pay

## 2019-03-19 DIAGNOSIS — G8194 Hemiplegia, unspecified affecting left nondominant side: Secondary | ICD-10-CM

## 2019-03-19 DIAGNOSIS — I639 Cerebral infarction, unspecified: Secondary | ICD-10-CM

## 2019-03-19 DIAGNOSIS — N3001 Acute cystitis with hematuria: Secondary | ICD-10-CM

## 2019-03-19 MED ORDER — APIXABAN 2.5 MG PO TABS
2.5000 mg | ORAL_TABLET | Freq: Two times a day (BID) | ORAL | Status: DC
  Administered 2019-03-19 – 2019-03-21 (×5): 2.5 mg via ORAL
  Filled 2019-03-19 (×5): qty 1

## 2019-03-19 MED ORDER — ASPIRIN EC 81 MG PO TBEC: 81.0000 mg | DELAYED_RELEASE_TABLET | Freq: Every day | ORAL | Status: DC

## 2019-03-19 MED ORDER — SODIUM CHLORIDE 0.9 % IV SOLN
INTRAVENOUS | Status: DC
  Administered 2019-03-19 (×2): via INTRAVENOUS

## 2019-03-19 NOTE — Progress Notes (Signed)
Occupational Therapy Evaluation Patient Details Name: Ethan Faulkner MRN: [id number] DOB: 07-06-1915 Today's Date: 03/19/2019    History of Present Illness Ethan Faulkner  is a 85 y.o. male, w hypothyroidism, hypertension, hyperlipidemia, Pafib, h/o stroke w baseline slurred speech, presents with weakness of the left arm. Imaging revealed several small acute cortical/subcortical infarcts within the right front lobe and right postcentral gyrus.   Clinical Impression   PTA, pt lived in one story home, son/daughter stays with him at night, and he has home health aides that stay with him during the day and provides assist for bathing and dressing. Pt currently presents with decreased strength, balance, and activity tolerance. Pt performed functional transfers with min A and self feeding with supervision. Pt requires mod A for UB ADLS and max A for LB ADLs. Pt would benefit from receiving OT acutely to increase safe performance of ADLs and facilitate safe dc. Recommend dc home with no OT follow up once medically stable per MD.     Follow Up Recommendations  Supervision/Assistance - 24 hour;No OT follow up    Equipment Recommendations  Hospital bed    Recommendations for Other Services PT consult     Precautions / Restrictions Precautions Precautions: Fall Restrictions Weight Bearing Restrictions: No      Mobility Bed Mobility Overal bed mobility: Needs Assistance Bed Mobility: Supine to Sit     Supine to sit: Mod assist;HOB elevated     General bed mobility comments: pt able to bring legs to EOB and reach for bed rail to come to EOB. Required mod A to shift hips to sit EOB.   Transfers Overall transfer level: Needs assistance Equipment used: Rolling walker (2 wheeled) Transfers: Sit to/from Stand Sit to Stand: Min assist         General transfer comment: required min A to power up and maintain balance while standing    Balance Overall balance assessment: Needs  assistance Sitting-balance support: No upper extremity supported;Feet supported Sitting balance-Leahy Scale: Fair Sitting balance - Comments: sitting EOB   Standing balance support: Bilateral upper extremity supported;During functional activity Standing balance-Leahy Scale: Poor Standing balance comment: required BUE support on RUE and min A for standing balance and functional mobility                           ADL either performed or assessed with clinical judgement   ADL Overall ADL's : Needs assistance/impaired Eating/Feeding: Supervision/ safety;With caregiver independent assisting;Sitting;Bed level Eating/Feeding Details (indicate cue type and reason): Pt was being assisted to eat at bed level upon arrival. Pt moved to chair and was able to feed self with supervision for safety Grooming: Sitting;Minimal assistance   Upper Body Bathing: Bed level;Moderate assistance   Lower Body Bathing: Sit to/from stand;Maximal assistance   Upper Body Dressing : Sitting;Moderate assistance   Lower Body Dressing: Maximal assistance;Sit to/from stand   Toilet Transfer: Minimal assistance;Ambulation;RW(simulated to recliner) Toilet Transfer Details (indicate cue type and reason): required min A to power up into standing and to maintain balance Toileting- Clothing Manipulation and Hygiene: Maximal assistance;Sit to/from stand       Functional mobility during ADLs: Minimal assistance;Rolling walker General ADL Comments: Pt required min A for functional transfers, mod A for UB ADLs, supervision/set up for eating in chair. Pt receives max assistance for LB ADLs at baseline; feel pt is near baseline     Vision Baseline Vision/History: (Pt nearsighted at baseline per caregiver report) Vision Assessment?:  No apparent visual deficits     Perception     Praxis      Pertinent Vitals/Pain Pain Assessment: Faces Faces Pain Scale: Hurts a little bit Pain Location: "heart" Pain  Descriptors / Indicators: Discomfort;Other (Comment)(not stated) Pain Intervention(s): Limited activity within patient's tolerance;Monitored during session;Repositioned     Hand Dominance Right   Extremity/Trunk Assessment Upper Extremity Assessment Upper Extremity Assessment: Generalized weakness   Lower Extremity Assessment Lower Extremity Assessment: Defer to PT evaluation   Cervical / Trunk Assessment Cervical / Trunk Assessment: Kyphotic   Communication Communication Communication: Receptive difficulties;Expressive difficulties;HOH   Cognition Arousal/Alertness: Awake/alert Behavior During Therapy: WFL for tasks assessed/performed Overall Cognitive Status: History of cognitive impairments - at baseline                                 General Comments: caregiver reports cognition is near baseline   General Comments  Personal sitting present throughout session    Exercises     Shoulder Instructions      Home Living Family/patient expects to be discharged to:: Private residence Living Arrangements: Children Available Help at Discharge: Personal care attendant;Available 24 hours/day;Family Type of Home: House Home Access: Stairs to enter [employer] of Steps: 1 Entrance Stairs-Rails: None Home Layout: One level     Bathroom Shower/Tub: Other (comment)(receives bed level bathing per PCA Simultaneous filing. User may not have seen previous data.)   Bathroom Toilet: Standard Bathroom Accessibility: Yes   Home Equipment: Walker - 2 wheels;Shower seat;Toilet riser;Wheelchair - manual          Prior Functioning/Environment Level of Independence: Needs assistance  Gait / Transfers Assistance Needed: requires assist from caregiver to walk with RW ADL's / Homemaking Assistance Needed: Given bed baths, requires assist from aides for dressing; able to feed self   Comments: Pt lives in his own house, son/daughter will come at night to stay with  him. Pt has aides that stay with him and provide assist during the day        OT Problem List: Decreased strength;Decreased range of motion;Decreased activity tolerance;Impaired balance (sitting and/or standing);Decreased cognition;Decreased safety awareness;Decreased knowledge of use of DME or AE;Decreased knowledge of precautions;Pain      OT Treatment/Interventions: Self-care/ADL training;DME and/or AE instruction;Therapeutic activities;Cognitive remediation/compensation;Patient/family education;Balance training    OT Goals(Current goals can be found in the care plan section) Acute Rehab OT Goals Patient Stated Goal: return home OT Goal Formulation: With patient Time For Goal Achievement: 04/02/19 Potential to Achieve Goals: Good ADL Goals Pt Will Perform Eating: with set-up;sitting Pt Will Perform Grooming: with min assist;sitting Pt Will Perform Upper Body Dressing: with min assist;sitting Pt Will Perform Lower Body Dressing: with mod assist;sit to/from stand Pt Will Transfer to Toilet: with min assist;ambulating;regular height toilet Pt Will Perform Toileting - Clothing Manipulation and hygiene: with mod assist;sit to/from stand  OT Frequency: Min 3X/week   Barriers to D/C:            Co-evaluation              AM-PAC OT "6 Clicks" Daily Activity     Outcome Measure Help from another person eating meals?: None Help from another person taking care of personal grooming?: A Lot Help from another person toileting, which includes using toliet, bedpan, or urinal?: A Lot Help from another person bathing (including washing, rinsing, drying)?: A Lot Help from another person to put on and taking off  regular upper body clothing?: A Lot Help from another person to put on and taking off regular lower body clothing?: A Lot 6 Click Score: 14   End of Session Equipment Utilized During Treatment: Rolling walker;Gait belt Nurse Communication: Mobility status  Activity Tolerance:  Patient tolerated treatment well Patient left: in chair;with call bell/phone within reach;with chair alarm set;with family/visitor present  OT Visit Diagnosis: Unsteadiness on feet (R26.81);Other abnormalities of gait and mobility (R26.89);Muscle weakness (generalized) (M62.81);Other symptoms and signs involving the nervous system (R29.898);Other symptoms and signs involving cognitive function;Pain Pain - part of body: ("heart")                Time: 1045-1110 OT Time Calculation (min): 25 min Charges:  OT General Charges $OT Visit: 1 Visit OT Evaluation $OT Eval Moderate Complexity: 1 Mod OT Treatments $Self Care/Home Management : 8-22 mins  Gus Rankin, OT Student  Di Kindle Laelah Siravo 03/19/2019, 12:02 PM

## 2019-03-19 NOTE — Consult Note (Addendum)
Referring Physician: Dr. Maylene Roes    Chief Complaint: Left arm weakness, bilateral leg weakness  HPI: Ethan Faulkner is an 85 y.o. male with a PMHx of HTN, hypothyroidism, HLD, PAF (on Eliquis) and prior stroke with dysarthria as well as prostate cancer who presented to the hospital with LUE weakness, bilateral leg weakness and gait instability.   On 10/23 patient presented to [facility] with left arm weakness that started at 9 AM. He was unable to walk using his walker that he uses at baseline. Patient is on Eliquis and denies missing any doses. He denies any headache, CP, SOB, EtOH, cigarettes, or drug use.   NIHSS: 4 dysarthria, right leg drift, LOC  Previous stroke hx: 02/16/2017: Left parietal lobe infarct (inability to speak, right side weakness) 04/2016 Right hemispheric stroke involving the internal capsule and BG. (acute onset of left sided weakness)  MRI brain: 1. Motion degraded examination. 2. Several small acute cortical/subcortical infarcts within the right frontal lobe motor strip and right postcentral gyrus. 3. Redemonstrated chronic infarcts within the left frontoparietal and right parietal lobes as well as right internal capsule. 4. Generalized parenchymal atrophy with chronic small vessel ischemic disease. 5. Multifocal chronic microhemorrhage most prominent within the cerebellum. Dolichoectasia of the intracranial arteries. Findings are suggestive of chronic hypertension.  Carotid ultrasound: Moderate calcific atherosclerosis without hemodynamically significant stenosis. Degree of narrowing estimated less than 50% bilaterally by ultrasound criteria. Nonvisualized right vertebral artery suggesting occlusion. Patent antegrade left vertebral artery.  Echocardiogram: 1. Left ventricular ejection fraction, by visual estimation, is 55%. The left ventricle has normal function. Normal left ventricular size. There is mildly increased left ventricular hypertrophy.  2. Left ventricular  diastolic Doppler parameters are indeterminate pattern of LV diastolic filling.  3. Global right ventricle has low normal systolic function.The right ventricular size is normal. No increase in right ventricular wall thickness.  4. Left atrial size was moderately dilated.  5. Right atrial size was severely dilated.  6. Mild mitral annular calcification.  7. Moderate aortic valve annular calcification.  8. The mitral valve is degenerative. Mild mitral valve regurgitation.  9. The tricuspid valve is grossly normal. Tricuspid valve regurgitation mild-moderate. 10. The aortic valve is tricuspid Aortic valve regurgitation is mild to moderate by color flow Doppler. Mild aortic valve stenosis. 11. The pulmonic valve was grossly normal. Pulmonic valve regurgitation is not visualized by color flow Doppler. 12. Aortic dilatation noted. 13. There is mild dilatation of the aortic root measuring 37 mm. 14. Moderately elevated pulmonary artery systolic pressure. 15. The inferior vena cava is normal in size with greater than 50% respiratory variability, suggesting right atrial pressure of 3 mmHg.   Past Medical History:  Diagnosis Date  . Afib ([place])   . Atrial fibrillation ([place])   . Cerebral infarction ([place])   . DDD (degenerative disc disease), lumbar   . Dysphagia   . Hypertension   . Hypothyroidism   . Migraine    "maybe monthly" (11/21/2015)  . Prostate cancer ([place]) ~ 1995   S/P radiation tx  . Renal mass    "slow growing something; doctors just watching it right now" (11/21/2015)  . Stroke [facility])    TIA  . Thrombocytopenia ([place])   . Thyroid disease    hypothyroid    Past Surgical History:  Procedure Laterality Date  . COLONOSCOPY  1999  . ESOPHAGOGASTRODUODENOSCOPY    . LAPAROSCOPIC CHOLECYSTECTOMY  1980s  . PROSTATE BIOPSY  ~ 1995  . TOTAL HIP ARTHROPLASTY Left 11/22/2015  Procedure: LEFT HIP HEMIARTHROPLASTY ;  Surgeon: Leandrew Koyanagi, MD;  Location: [facility];  Service: Orthopedics;   Laterality: Left;    Family History  Problem Relation Age of Onset  . Stroke Sister        in her 65s  . CAD Neg Hx   . COPD Neg Hx   . Diabetes Neg Hx   . Heart disease Neg Hx    Social History:  reports that he has quit smoking. He has never used smokeless tobacco. He reports current alcohol use. He reports that he does not use drugs.  Allergies:  Allergies  Allergen Reactions  . Xanax [Alprazolam] Other (See Comments)    Dizzy, Sedated  . Tape Rash    PATIENT'S SKIN WILL TEAR/PLEASE EITHER USE PAPER TAPE OR COBAN WRAP!!    Medications:  Scheduled: .  stroke: mapping our early stages of recovery book   Does not apply Once  . apixaban  2.5 mg Oral BID  . atorvastatin  20 mg Oral q1800  . Chlorhexidine Gluconate Cloth  6 each Topical Daily  . docusate sodium  100 mg Oral Daily  . levothyroxine  25 mcg Oral Q0600  . loratadine  10 mg Oral Daily  . metoprolol succinate  25 mg Oral Daily   Continuous: . sodium chloride    . cefTRIAXone (ROCEPHIN)  IV 1 g (03/18/19 1936)   [id number] **OR** acetaminophen (TYLENOL) oral liquid 160 mg/5 mL **OR** acetaminophen, ondansetron (ZOFRAN) IV, promethazine, traMADol  ROS: ROS was performed and is negative except as noted in HPI  Physical Examination: Blood pressure 121/65, pulse 74, temperature 98.6 F (37 C), temperature source Oral, resp. rate 18, height 5\' 7"  (1.702 m), weight 72.6 kg, SpO2 93 %.  General Examination:                                                                                                      HEENT-  Normocephalic, no lesions, without obvious abnormality.  Normal external eye and conjunctiva. Cardiovascular-  pulses palpable throughout  Lungs no excessive working breathing.  Saturations within normal limits on RA Extremities- Warm, dry and intact Musculoskeletal-no joint tenderness, deformity or swelling Skin-warm and dry, no hyperpigmentation, vitiligo, or suspicious lesions  Neurological  Examination  Mental Status: Alert, oriented to name and age. Speech hypophonic with short, dysarthric sentences. Unable to assess for errors of grammar or syntax given paucity of speech output in response to questions. Able to follow simple commands without difficulty. Cranial Nerves: II: Visual fields grossly normal, PERRL III,IV, VI: ptosis not present, EOMI V,VII: smile symmetric, facial light touch sensation normal bilaterally VIII: hearing normal bilaterally IX,X: Mild hypophonia XI: Symmetric XII: midline tongue extension Motor: Right : Upper extremity   5/5  Left:     Upper extremity   5/5  Lower extremity   5/5   Lower extremity   5/5 Tone and bulk:normal tone throughout; no atrophy noted Sensory:  light touch intact throughout, bilaterally Deep Tendon Reflexes: 2+ and symmetric biceps and hypoactive patellae Cerebellar: No gross ataxia noted Gait: deferred  Results for orders placed or performed during the hospital encounter of 03/17/19 (from the past 48 hour(s))  CBC with Differential/Platelet     Status: None   Collection Time: 03/17/19  3:11 PM  Result Value Ref Range   WBC 4.6 4.0 - 10.5 K/uL   RBC 4.25 4.22 - 5.81 MIL/uL   Hemoglobin 13.1 13.0 - 17.0 g/dL   HCT 41.6 39.0 - 52.0 %   MCV 97.9 80.0 - 100.0 fL   MCH 30.8 26.0 - 34.0 pg   MCHC 31.5 30.0 - 36.0 g/dL   RDW 13.9 11.5 - 15.5 %   Platelets 164 150 - 400 K/uL   nRBC 0.0 0.0 - 0.2 %   Neutrophils Relative % 58 %   Neutro Abs 2.6 1.7 - 7.7 K/uL   Lymphocytes Relative 27 %   Lymphs Abs 1.3 0.7 - 4.0 K/uL   Monocytes Relative 12 %   Monocytes Absolute 0.6 0.1 - 1.0 K/uL   Eosinophils Relative 3 %   Eosinophils Absolute 0.1 0.0 - 0.5 K/uL   Basophils Relative 0 %   Basophils Absolute 0.0 0.0 - 0.1 K/uL   Immature Granulocytes 0 %   Abs Immature Granulocytes 0.02 0.00 - 0.07 K/uL    Comment: Performed at [facility], [address], [place], [place] [address]  Comprehensive metabolic panel     Status:  Abnormal   Collection Time: 03/17/19  3:11 PM  Result Value Ref Range   Sodium 137 135 - 145 mmol/L   Potassium 4.4 3.5 - 5.1 mmol/L   Chloride 107 98 - 111 mmol/L   CO2 22 22 - 32 mmol/L   Glucose, Bld 106 (H) 70 - 99 mg/dL   BUN 28 (H) 8 - 23 mg/dL   Creatinine, Ser 0.98 0.61 - 1.24 mg/dL   Calcium 9.0 8.9 - 10.3 mg/dL   Total Protein 7.1 6.5 - 8.1 g/dL   Albumin 4.1 3.5 - 5.0 g/dL   AST 19 15 - 41 U/L   ALT 16 0 - 44 U/L   Alkaline Phosphatase 57 38 - 126 U/L   Total Bilirubin 1.4 (H) 0.3 - 1.2 mg/dL   GFR calc non Af Amer >60 >60 mL/min   GFR calc Af Amer >60 >60 mL/min   Anion gap 8 5 - 15    Comment: Performed at [facility], [address], [place], [place] [address]  Urinalysis, Routine w reflex microscopic     Status: Abnormal   Collection Time: 03/17/19  5:38 PM  Result Value Ref Range   Color, Urine YELLOW YELLOW   APPearance HAZY (A) CLEAR   Specific Gravity, Urine 1.017 1.005 - 1.030   pH 5.0 5.0 - 8.0   Glucose, UA NEGATIVE NEGATIVE mg/dL   Hgb urine dipstick MODERATE (A) NEGATIVE   Bilirubin Urine NEGATIVE NEGATIVE   Ketones, ur NEGATIVE NEGATIVE mg/dL   Protein, ur NEGATIVE NEGATIVE mg/dL   Nitrite POSITIVE (A) NEGATIVE   Leukocytes,Ua LARGE (A) NEGATIVE   RBC / HPF 21-50 0 - 5 RBC/hpf   WBC, UA >50 (H) 0 - 5 WBC/hpf   Bacteria, UA RARE (A) NONE SEEN   WBC Clumps PRESENT    Mucus PRESENT     Comment: Performed at [facility], [address] [place], [place] [address]  SARS CORONAVIRUS 2 (TAT 6-24 HRS) Nasopharyngeal Urine, Catheterized     Status: None   Collection Time: 03/17/19  8:00 PM   Specimen: Urine, Catheterized; Nasopharyngeal  Result Value  Ref Range   SARS Coronavirus 2 NEGATIVE NEGATIVE    Comment: (NOTE) SARS-CoV-2 target nucleic acids are NOT DETECTED. The SARS-CoV-2 RNA is generally detectable in upper and lower respiratory specimens during the acute phase of infection. Negative results do not preclude SARS-CoV-2 infection, do not rule  out co-infections with other pathogens, and should not be used as the sole basis for treatment or other patient management decisions. Negative results must be combined with clinical observations, patient history, and epidemiological information. The expected result is Negative. Fact Sheet for Patients: SugarRoll.be Fact Sheet for Healthcare Providers: https://www.woods-mathews.com/ This test is not yet approved or cleared by the [place] FDA and  has been authorized for detection and/or diagnosis of SARS-CoV-2 by FDA under an Emergency Use Authorization (EUA). This EUA will remain  in effect (meaning this test can be used) for the duration of the COVID-19 declaration under Section 56 4(b)(1) of the Act, 21 U.S.C. section 360bbb-3(b)(1), unless the authorization is terminated or revoked sooner. Performed at [facility], [place] [address], [place], [place] [address]   Culture, blood (Routine X 2) w Reflex to ID Panel     Status: None (Preliminary result)   Collection Time: 03/17/19  8:14 PM   Specimen: Right Antecubital; Blood  Result Value Ref Range   Specimen Description RIGHT ANTECUBITAL    Special Requests      BOTTLES DRAWN AEROBIC AND ANAEROBIC Blood Culture adequate volume   Culture      NO GROWTH 2 DAYS Performed at [facility], [address], [place], [place] [address]    Report Status PENDING   Culture, blood (Routine X 2) w Reflex to ID Panel     Status: None (Preliminary result)   Collection Time: 03/17/19  8:15 PM   Specimen: Left Antecubital; Blood  Result Value Ref Range   Specimen Description LEFT ANTECUBITAL    Special Requests      BOTTLES DRAWN AEROBIC AND ANAEROBIC Blood Culture adequate volume   Culture      NO GROWTH 2 DAYS Performed at [facility], [address], [place], [place] [address]    Report Status PENDING   Hemoglobin A1c     Status: None   Collection Time: 03/18/19  5:37 AM  Result Value  Ref Range   Hgb A1c MFr Bld 5.6 4.8 - 5.6 %    Comment: (NOTE) Pre diabetes:          5.7%-6.4% Diabetes:              >6.4% Glycemic control for   <7.0% adults with diabetes    Mean Plasma Glucose 114.02 mg/dL    Comment: Performed at [facility] [address], [place],  [address]  Lipid panel     Status: None   Collection Time: 03/18/19  5:37 AM  Result Value Ref Range   Cholesterol 103 0 - 200 mg/dL   Triglycerides 51 <150 mg/dL   HDL 43 >40 mg/dL   Total CHOL/HDL Ratio 2.4 RATIO   VLDL 10 0 - 40 mg/dL   LDL Cholesterol 50 0 - 99 mg/dL    Comment:        Total Cholesterol/HDL:CHD Risk Coronary Heart Disease Risk Table                     Men   Women  1/2 Average Risk   3.4   3.3  Average Risk       5.0  4.4  2 X Average Risk   9.6   7.1  3 X Average Risk  23.4   11.0        Use the calculated Patient Ratio above and the CHD Risk Table to determine the patient's CHD Risk.        ATP III CLASSIFICATION (LDL):  <100     mg/dL   Optimal  100-129  mg/dL   Near or Above                    Optimal  130-159  mg/dL   Borderline  160-189  mg/dL   High  >190     mg/dL   Very High Performed at [facility], [address] [place], [place] [address]   CBC     Status: Abnormal   Collection Time: 03/18/19  5:38 AM  Result Value Ref Range   WBC 4.9 4.0 - 10.5 K/uL   RBC 4.15 (L) 4.22 - 5.81 MIL/uL   Hemoglobin 12.9 (L) 13.0 - 17.0 g/dL   HCT 40.4 39.0 - 52.0 %   MCV 97.3 80.0 - 100.0 fL   MCH 31.1 26.0 - 34.0 pg   MCHC 31.9 30.0 - 36.0 g/dL   RDW 13.8 11.5 - 15.5 %   Platelets 144 (L) 150 - 400 K/uL   nRBC 0.0 0.0 - 0.2 %    Comment: Performed at [facility], [address] [place], [place] [address]  Comprehensive metabolic panel     Status: Abnormal   Collection Time: 03/18/19  5:38 AM  Result Value Ref Range   Sodium 139 135 - 145 mmol/L   Potassium 3.9 3.5 - 5.1 mmol/L   Chloride 107 98 - 111 mmol/L   CO2 22 22 - 32 mmol/L   Glucose, Bld 94 70 -  99 mg/dL   BUN 25 (H) 8 - 23 mg/dL   Creatinine, Ser 0.74 0.61 - 1.24 mg/dL   Calcium 9.0 8.9 - 10.3 mg/dL   Total Protein 6.5 6.5 - 8.1 g/dL   Albumin 3.8 3.5 - 5.0 g/dL   AST 16 15 - 41 U/L   ALT 14 0 - 44 U/L   Alkaline Phosphatase 54 38 - 126 U/L   Total Bilirubin 1.5 (H) 0.3 - 1.2 mg/dL   GFR calc non Af Amer >60 >60 mL/min   GFR calc Af Amer >60 >60 mL/min   Anion gap 10 5 - 15    Comment: Performed at [facility], [address], [place], [place] [address]   Ct Head Wo Contrast  Result Date: 03/17/2019 CLINICAL DATA:  Altered mental status EXAM: CT HEAD WITHOUT CONTRAST TECHNIQUE: Contiguous axial images were obtained from the base of the skull through the vertex without intravenous contrast. COMPARISON:  None February 15, 2017 FINDINGS: Brain: There is moderate diffuse atrophy, stable. There is no appreciable mass, hemorrhage, extra-axial fluid collection, or midline shift. There is evidence of prior infarct at the left frontal-parietal junction which shows evolution and mild extension compared to the previous study. There is evidence of a prior infarct in the right internal capsule/anterior superior thalamus junction. There is patchy small vessel disease in the centra semiovale bilaterally. No acute appearing infarct is demonstrable on this study. Vascular: No hyperdense vessel evident. There is extensive vascular calcification in the distal vertebral arteries, basilar artery, carotid siphon regions, and proximal right middle cerebral arteries. Skull: The bony calvarium appears intact. Sinuses/Orbits: There is mild mucosal thickening in several ethmoid  air cells. Other visualized paranasal sinuses are clear. Visualized orbits appear symmetric bilaterally. Other: Visualized mastoid air cells are clear. IMPRESSION: Stable atrophy. Prior infarct at the left frontal-parietal junction superiorly with evolution and slight extension of infarction in this area compared to the prior study from  2018. Prior infarct at the junction of the right internal capsule and anterior, superior right thalamus. Periventricular small vessel disease evident. No acute infarct is demonstrable. No mass or hemorrhage evident. Extensive arterial vascular calcification noted. Mild mucosal thickening in several ethmoid air cells. Electronically Signed   By: Lowella Grip III M.D.   On: 03/17/2019 16:34   Mr Brain Wo Contrast  Result Date: 03/18/2019 CLINICAL DATA:  Altered level of consciousness (LOC), unexplained altered mental status, rule out CVA. EXAM: MRI HEAD WITHOUT CONTRAST TECHNIQUE: Multiplanar, multiecho pulse sequences of the brain and surrounding structures were obtained without intravenous contrast. COMPARISON:  Head CT 08/12/2018, brain MRI 02/16/2017, brain MRI 05/22/2016 FINDINGS: Brain: Multiple sequences are motion degraded, limiting evaluation. This includes moderate/severe motion degradation of the axial FLAIR sequence. There are multiple small acute cortical/subcortical infarcts along the right central sulcus, involving the right frontal lobe motor strip and right postcentral gyrus. No evidence of intracranial mass. No midline shift or extra-axial fluid collection. Redemonstrated chronic infarcts within the left frontoparietal lobes and right parietal lobe. Redemonstrated chronic lacunar infarct within the right internal capsule. There is a background of moderate/advanced chronic small vessel ischemic disease. As before, there are multiple supratentorial and infratentorial chronic microhemorrhages which are most numerous within the cerebellum. Generalized parenchymal atrophy. Vascular: Flow voids maintained within the proximal large arterial vessels. Dolichoectasia of the intracranial arteries. Skull and upper cervical spine: No focal marrow lesion. C3-C4 grade 1 anterolisthesis. Sinuses/Orbits: Visualized orbits demonstrate no acute abnormality. Minimal ethmoid sinus mucosal thickening. Trace  fluid within bilateral mastoid air cells. These results were called by telephone at the time of interpretation on 03/18/2019 at 8:16 pm to provider Dr. Kennon Holter, Who verbally acknowledged these results. IMPRESSION: 1. Motion degraded examination. 2. Several small acute cortical/subcortical infarcts within the right frontal lobe motor strip and right postcentral gyrus. 3. Redemonstrated chronic infarcts within the left frontoparietal and right parietal lobes as well as right internal capsule. 4. Generalized parenchymal atrophy with chronic small vessel ischemic disease. 5. Multifocal chronic microhemorrhage most prominent within the cerebellum. Dolichoectasia of the intracranial arteries. Findings are suggestive of chronic hypertension. Electronically Signed   By: Kellie Simmering DO   On: 03/18/2019 20:16   US Carotid Bilateral (at [facility] Only)  Result Date: 03/18/2019 CLINICAL DATA:  TIA symptoms EXAM: BILATERAL CAROTID DUPLEX ULTRASOUND TECHNIQUE: Pearline Cables scale imaging, color Doppler and duplex ultrasound were performed of bilateral carotid and vertebral arteries in the neck. COMPARISON:  None. FINDINGS: Criteria: Quantification of carotid stenosis is based on velocity parameters that correlate the residual internal carotid diameter with NASCET-based stenosis levels, using the diameter of the distal internal carotid lumen as the denominator for stenosis measurement. The following velocity measurements were obtained: RIGHT ICA: 63/8 cm/sec CCA: 0000000 cm/sec SYSTOLIC ICA/CCA RATIO:  0000000 ECA: 88 cm/sec LEFT ICA: 79/11 cm/sec CCA: [id number] cm/sec SYSTOLIC ICA/CCA RATIO:  1.0 ECA: 57 cm/sec RIGHT CAROTID ARTERY: Moderate calcified plaque formation. No hemodynamically significant right ICA stenosis, velocity elevation, or turbulent flow. Degree of narrowing less than 50%. RIGHT VERTEBRAL ARTERY:  Not visualized, possibly occluded LEFT CAROTID ARTERY: Similar moderate calcified plaque formation. No hemodynamically  significant left ICA stenosis, velocity elevation, or turbulent flow. LEFT  VERTEBRAL ARTERY:  Antegrade IMPRESSION: Moderate calcific atherosclerosis without hemodynamically significant stenosis. Degree of narrowing estimated less than 50% bilaterally by ultrasound criteria. Nonvisualized right vertebral artery suggesting occlusion Patent antegrade left vertebral artery Electronically Signed   By: Jerilynn Mages.  Shick M.D.   On: 03/18/2019 11:15   Dg Chest Portable 1 View  Result Date: 03/17/2019 CLINICAL DATA:  EMS called out to pt's house because pt was "stumbling around, not himself." Family called pcp and they were concerned about possible TIA. History of stroke. EMS says pt talking much but will follow commands. Family says pt talks some days but some days he doesn't. Reports pt has trouble communicating since last stroke. Pt has DNR but family couldn't find paperwork. CBG 122. bp 146/69, hr 70 and 94% on room air. HISTORY OF CANCER, STROKE, CEREBRAL INFARCTION, HTN EXAM: PORTABLE CHEST 1 VIEW COMPARISON:  05/06/2018 FINDINGS: Cardiac silhouette mildly enlarged.  No mediastinal or hilar masses. Lungs are clear.  No convincing pleural effusion.  No pneumothorax. Skeletal structures are demineralized but grossly intact. IMPRESSION: 1. No acute cardiopulmonary disease. 2. Stable cardiomegaly. Electronically Signed   By: Lajean Manes M.D.   On: 03/17/2019 16:01     Assessment: 85 y.o. male with a PMHx of HTN, hypothyroidism, HLD, PAF ( on eliquis) and prior stroke with dysarthria as well as prostate cancer who presented to the hospital with LUE weakness, bilateral leg weakness and gait instability. 1. Exam reveals severe dysarthria from prior stroke. Slow to answer some questions. Some cognitive impairment.  2. MRI brain reveals several small acute cortical/subcortical infarcts within the right frontal lobe motor strip and right postcentral gyrus. Redemonstrated chronic infarcts within the left frontoparietal and  right parietal lobes as well as right internal capsule. Generalized parenchymal atrophy with chronic small vessel ischemic disease. Multifocal chronic microhemorrhage most prominent within the cerebellum. 3. TTE reveals no mural thrombus.  4. Carotid ultrasound reveals < 50% ICA stenosis bilaterally. Nonvisualized right vertebral artery suggesting occlusion. Patent antegrade left vertebral artery. 5. Stroke Risk Factors - Atrial fibrillation, prior stroke, HTN and history of cancer   Recommendations: 1. HgbA1c, fasting lipid panel 2. MRA of the brain without contrast 3. PT consult, OT consult, Speech consult 4. Continue Eliquis 5. On low-dose Lipitor.  6. Telemetry monitoring 7. Frequent neuro checks 8. BP management SBP < 140  Electronically signed: Dr. Kerney [place]

## 2019-03-19 NOTE — Progress Notes (Signed)
PROGRESS NOTE    Ethan Faulkner  [id number] DOB: Jul 15, 1915 DOA: 03/17/2019 PCP: Kathyrn Drown, MD     Brief Narrative:  Ethan Faulkner is a 85 year old male with a history of hypothyroidism, hypertension, hyperlipidemia, paroxysmal atrial fibrillation on Eliquis, previous stroke with dysarthria, prostate cancer presenting with left upper extremity weakness, leg weakness, and gait instability.  The patient has some difficulty providing history secondary to his previous stroke.  History was obtained from speaking with the patient's daughters at the bedside.  When the patient woke up in the a.m. on 03/17/2019, the patient had much difficulty getting out of bed due to increasing weakness.  This was unusual as he normally does not require maximal assistance.  In addition, he required maximal assistance getting to the bathroom using his walker.  Once again this is not his usual baseline.  As the day progressed, his caretakers noticed that he may have been neglecting his left side and not using his left arm/hand as much. Notably, the patient has an indwelling Foley catheter.  It was last changed approximately 2-1/2 weeks prior to this admission.  UA was significant for large leukocytes, positive nitrites, > 50 WBC.  He was started on empiric Rocephin.  He was transferred to [facility] for further stroke work-up.  New events last 24 hours / Subjective: Patient evaluated with caregiver/sitter at bedside.  Daughter was on the phone on speaker phone throughout my examination.  Apparently, patient lives at home alone, but has a 24-hour caregiver/family and is never home alone.  He does have some dysarthria at baseline from his previous stroke.  Caregiver states that today, his speech and mentation is at his baseline.  Normally, he is able to ambulate with minimal assistance, able to feed himself.  He does need assistance with bathing and changing his clothes.   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 Prior infarct at the junction of the right internal capsule and anterior, superior right thalamus. Periventricular small vessel disease evident. No acute infarct is demonstrable. No mass or hemorrhage evident. -MRI brain: Several small acute cortical/subcortical infarcts within the right frontal lobe motor strip and right postcentral gyrus. Redemonstrated chronic infarcts within the left frontoparietal and right parietal lobes as well as right internal capsule. -Carotid ultrasound: Moderate calcific atherosclerosis without hemodynamically significant stenosis. Degree of narrowing estimated less than 50% bilaterally by ultrasound criteria. -Echocardiogram: EF 55% -LDL 50, hemoglobin A1c 5.6 -Continue Lipitor, Eliquis -PT OT SLP -Neurology consulted  Paroxysmal atrial fibrillation -Continue Eliquis, Toprol  Essential hypertension -Continue Toprol but hold Norvasc to allow for permissive hypertension  Acute UTI, related to chronic indwelling Foley catheter which was present on admission -UA with positive nitrite, large leukocytes, > 50 WBC.  Urine culture pending -Blood culture negative to date -Empiric Rocephin  Hypothyroidism -Continue Synthroid    DVT prophylaxis: Eliquis Code Status: DNR Family Communication: Caregiver at bedside, daughter on speakerphone during examination  Disposition Plan: Pending neuro work up, suspect return home on discharge   Consultants:  Neurology  Procedures:   None    Antimicrobials:  Anti-infectives (From admission, onward)   Start     Dose/Rate Route Frequency Ordered Stop   03/17/19 2000  cefTRIAXone (ROCEPHIN) 1 g in sodium chloride 0.9 % 100 mL IVPB     1 g 200 mL/hr over 30 Minutes Intravenous Every 24 hours 03/17/19 1934     03/17/19 1900  ciprofloxacin (CIPRO) IVPB 400 mg     400 mg 200 mL/hr over 60 Minutes Intravenous  Once 03/17/19 1859 03/17/19 2112        Objective: Vitals:   03/18/19 1707 03/19/19 0027 03/19/19 0444 03/19/19 0453  BP: (!) 154/71 (!) 108/49 121/65 121/65  Pulse: 84 74 74 74  Resp: 16 16 18 18   Temp: 99.1 F (37.3 C) 98.3 F (36.8 C) 98.6 F (37 C) 98.6 F (37 C)  TempSrc: Oral Axillary Oral Oral  SpO2: 94% 96% 93%   Weight:    72.6 kg  Height:    5\' 7"  (1.702 m)    Intake/Output Summary (Last 24 hours) at 03/19/2019 1027 Last data filed at 03/18/2019 1936 Gross per 24 hour  Intake 86.78 ml  Output -  Net 86.78 ml   Filed Weights   03/19/19 0453  Weight: 72.6 kg    Examination:  General exam: Appears calm and comfortable  Respiratory system: Clear to auscultation. Respiratory effort normal. No respiratory distress. No conversational dyspnea.  Cardiovascular system: S1 & S2 heard, irregular. No murmurs. No pedal edema. Gastrointestinal system: Abdomen is nondistended, soft and nontender. Normal bowel sounds heard. Central nervous system: Alert with chronic dysarthria, able to follow simple commands, left lower extremity weaker compared to right Extremities: Symmetric in appearance  Skin: No rashes, lesions or ulcers on exposed skin  Psychiatry: Judgement and insight appear normal.  Data Reviewed: I have personally reviewed following labs and imaging studies  CBC: Recent Labs  Lab 03/17/19 1511 03/18/19 0538  WBC 4.6 4.9  NEUTROABS 2.6  --   HGB 13.1 12.9*  HCT 41.6 40.4  MCV 97.9 97.3  PLT 164 [id number]*   Basic Metabolic Panel: Recent Labs  Lab 03/17/19 1511 03/18/19 0538  NA 137 139   K 4.4 3.9  CL 107 107  CO2 22 22  GLUCOSE 106* 94  BUN 28* 25*  CREATININE 0.98 0.74  CALCIUM 9.0 9.0   GFR: Estimated Creatinine Clearance: 45.9 mL/min (by C-G formula based on SCr of 0.74 mg/dL). Liver Function Tests: Recent Labs  Lab 03/17/19 1511 03/18/19 0538  AST 19 16  ALT 16 14  ALKPHOS 57 54  BILITOT 1.4* 1.5*  PROT 7.1 6.5  ALBUMIN 4.1 3.8   No results for input(s): LIPASE, AMYLASE in the last 168 hours. No results for input(s): AMMONIA in the last 168 hours. Coagulation Profile: No results for input(s): INR, PROTIME in the last 168 hours. Cardiac Enzymes: No results for input(s): CKTOTAL, CKMB, CKMBINDEX, TROPONINI in the last 168 hours. BNP (last 3 results) No results for input(s): PROBNP in the last 8760 hours. HbA1C: Recent Labs    03/18/19 0537  HGBA1C 5.6   CBG: No results for input(s): GLUCAP in the last 168 hours. Lipid Profile: Recent Labs    03/18/19 0537  CHOL 103  HDL 43  LDLCALC 50  TRIG 51  CHOLHDL 2.4   Thyroid Function Tests: No results for input(s): TSH, T4TOTAL, FREET4, T3FREE, THYROIDAB in the last 72 hours. Anemia Panel: No results for input(s): VITAMINB12, FOLATE, FERRITIN, TIBC, IRON,  RETICCTPCT in the last 72 hours. Sepsis Labs: No results for input(s): PROCALCITON, LATICACIDVEN in the last 168 hours.  Recent Results (from the past 240 hour(s))  SARS CORONAVIRUS 2 (TAT 6-24 HRS) Nasopharyngeal Urine, Catheterized     Status: None   Collection Time: 03/17/19  8:00 PM   Specimen: Urine, Catheterized; Nasopharyngeal  Result Value Ref Range Status   SARS Coronavirus 2 NEGATIVE NEGATIVE Final    Comment: (NOTE) SARS-CoV-2 target nucleic acids are NOT DETECTED. The SARS-CoV-2 RNA is generally detectable in upper and lower respiratory specimens during the acute phase of infection. Negative results do not preclude SARS-CoV-2 infection, do not rule out co-infections with other pathogens, and should not be used as the sole  basis for treatment or other patient management decisions. Negative results must be combined with clinical observations, patient history, and epidemiological information. The expected result is Negative. Fact Sheet for Patients: SugarRoll.be Fact Sheet for Healthcare Providers: https://www.woods-mathews.com/ This test is not yet approved or cleared by the [place] FDA and  has been authorized for detection and/or diagnosis of SARS-CoV-2 by FDA under an Emergency Use Authorization (EUA). This EUA will remain  in effect (meaning this test can be used) for the duration of the COVID-19 declaration under Section 56 4(b)(1) of the Act, 21 U.S.C. section 360bbb-3(b)(1), unless the authorization is terminated or revoked sooner. Performed at [facility], [place] [address], [place], [place] [address]   Culture, blood (Routine X 2) w Reflex to ID Panel     Status: None (Preliminary result)   Collection Time: 03/17/19  8:14 PM   Specimen: Right Antecubital; Blood  Result Value Ref Range Status   Specimen Description RIGHT ANTECUBITAL  Final   Special Requests   Final    BOTTLES DRAWN AEROBIC AND ANAEROBIC Blood Culture adequate volume   Culture   Final    NO GROWTH 2 DAYS Performed at [facility], [address] [place], [place] [address]    Report Status PENDING  Incomplete  Culture, blood (Routine X 2) w Reflex to ID Panel     Status: None (Preliminary result)   Collection Time: 03/17/19  8:15 PM   Specimen: Left Antecubital; Blood  Result Value Ref Range Status   Specimen Description LEFT ANTECUBITAL  Final   Special Requests   Final    BOTTLES DRAWN AEROBIC AND ANAEROBIC Blood Culture adequate volume   Culture   Final    NO GROWTH 2 DAYS Performed at [facility], [address], [place],  [address]    Report Status PENDING  Incomplete      Radiology Studies: Ct Head Wo Contrast  Result Date: 03/17/2019 CLINICAL DATA:   Altered mental status EXAM: CT HEAD WITHOUT CONTRAST TECHNIQUE: Contiguous axial images were obtained from the base of the skull through the vertex without intravenous contrast. COMPARISON:  None February 15, 2017 FINDINGS: Brain: There is moderate diffuse atrophy, stable. There is no appreciable mass, hemorrhage, extra-axial fluid collection, or midline shift. There is evidence of prior infarct at the left frontal-parietal junction which shows evolution and mild extension compared to the previous study. There is evidence of a prior infarct in the right internal capsule/anterior superior thalamus junction. There is patchy small vessel disease in the centra semiovale bilaterally. No acute appearing infarct is demonstrable on this study. Vascular: No hyperdense vessel evident. There is extensive vascular calcification in the distal vertebral arteries, basilar artery, carotid siphon regions, and proximal right middle cerebral arteries. Skull: The bony  calvarium appears intact. Sinuses/Orbits: There is mild mucosal thickening in several ethmoid air cells. Other visualized paranasal sinuses are clear. Visualized orbits appear symmetric bilaterally. Other: Visualized mastoid air cells are clear. IMPRESSION: Stable atrophy. Prior infarct at the left frontal-parietal junction superiorly with evolution and slight extension of infarction in this area compared to the prior study from 2018. Prior infarct at the junction of the right internal capsule and anterior, superior right thalamus. Periventricular small vessel disease evident. No acute infarct is demonstrable. No mass or hemorrhage evident. Extensive arterial vascular calcification noted. Mild mucosal thickening in several ethmoid air cells. Electronically Signed   By: Lowella Grip III M.D.   On: 03/17/2019 16:34   Mr Brain Wo Contrast  Result Date: 03/18/2019 CLINICAL DATA:  Altered level of consciousness (LOC), unexplained altered mental status, rule out CVA.  EXAM: MRI HEAD WITHOUT CONTRAST TECHNIQUE: Multiplanar, multiecho pulse sequences of the brain and surrounding structures were obtained without intravenous contrast. COMPARISON:  Head CT 08/12/2018, brain MRI 02/16/2017, brain MRI 05/22/2016 FINDINGS: Brain: Multiple sequences are motion degraded, limiting evaluation. This includes moderate/severe motion degradation of the axial FLAIR sequence. There are multiple small acute cortical/subcortical infarcts along the right central sulcus, involving the right frontal lobe motor strip and right postcentral gyrus. No evidence of intracranial mass. No midline shift or extra-axial fluid collection. Redemonstrated chronic infarcts within the left frontoparietal lobes and right parietal lobe. Redemonstrated chronic lacunar infarct within the right internal capsule. There is a background of moderate/advanced chronic small vessel ischemic disease. As before, there are multiple supratentorial and infratentorial chronic microhemorrhages which are most numerous within the cerebellum. Generalized parenchymal atrophy. Vascular: Flow voids maintained within the proximal large arterial vessels. Dolichoectasia of the intracranial arteries. Skull and upper cervical spine: No focal marrow lesion. C3-C4 grade 1 anterolisthesis. Sinuses/Orbits: Visualized orbits demonstrate no acute abnormality. Minimal ethmoid sinus mucosal thickening. Trace fluid within bilateral mastoid air cells. These results were called by telephone at the time of interpretation on 03/18/2019 at 8:16 pm to provider Dr. Kennon Holter, Who verbally acknowledged these results. IMPRESSION: 1. Motion degraded examination. 2. Several small acute cortical/subcortical infarcts within the right frontal lobe motor strip and right postcentral gyrus. 3. Redemonstrated chronic infarcts within the left frontoparietal and right parietal lobes as well as right internal capsule. 4. Generalized parenchymal atrophy with chronic small vessel  ischemic disease. 5. Multifocal chronic microhemorrhage most prominent within the cerebellum. Dolichoectasia of the intracranial arteries. Findings are suggestive of chronic hypertension. Electronically Signed   By: Kellie Simmering DO   On: 03/18/2019 20:16   US Carotid Bilateral (at [facility] Only)  Result Date: 03/18/2019 CLINICAL DATA:  TIA symptoms EXAM: BILATERAL CAROTID DUPLEX ULTRASOUND TECHNIQUE: Pearline Cables scale imaging, color Doppler and duplex ultrasound were performed of bilateral carotid and vertebral arteries in the neck. COMPARISON:  None. FINDINGS: Criteria: Quantification of carotid stenosis is based on velocity parameters that correlate the residual internal carotid diameter with NASCET-based stenosis levels, using the diameter of the distal internal carotid lumen as the denominator for stenosis measurement. The following velocity measurements were obtained: RIGHT ICA: 63/8 cm/sec CCA: 0000000 cm/sec SYSTOLIC ICA/CCA RATIO:  0000000 ECA: 88 cm/sec LEFT ICA: 79/11 cm/sec CCA: [id number] cm/sec SYSTOLIC ICA/CCA RATIO:  1.0 ECA: 57 cm/sec RIGHT CAROTID ARTERY: Moderate calcified plaque formation. No hemodynamically significant right ICA stenosis, velocity elevation, or turbulent flow. Degree of narrowing less than 50%. RIGHT VERTEBRAL ARTERY:  Not visualized, possibly occluded LEFT CAROTID ARTERY: Similar moderate calcified plaque formation. No  hemodynamically significant left ICA stenosis, velocity elevation, or turbulent flow. LEFT VERTEBRAL ARTERY:  Antegrade IMPRESSION: Moderate calcific atherosclerosis without hemodynamically significant stenosis. Degree of narrowing estimated less than 50% bilaterally by ultrasound criteria. Nonvisualized right vertebral artery suggesting occlusion Patent antegrade left vertebral artery Electronically Signed   By: Jerilynn Mages.  Shick M.D.   On: 03/18/2019 11:15   Dg Chest Portable 1 View  Result Date: 03/17/2019 CLINICAL DATA:  EMS called out to pt's house because pt was  "stumbling around, not himself." Family called pcp and they were concerned about possible TIA. History of stroke. EMS says pt talking much but will follow commands. Family says pt talks some days but some days he doesn't. Reports pt has trouble communicating since last stroke. Pt has DNR but family couldn't find paperwork. CBG 122. bp 146/69, hr 70 and 94% on room air. HISTORY OF CANCER, STROKE, CEREBRAL INFARCTION, HTN EXAM: PORTABLE CHEST 1 VIEW COMPARISON:  05/06/2018 FINDINGS: Cardiac silhouette mildly enlarged.  No mediastinal or hilar masses. Lungs are clear.  No convincing pleural effusion.  No pneumothorax. Skeletal structures are demineralized but grossly intact. IMPRESSION: 1. No acute cardiopulmonary disease. 2. Stable cardiomegaly. Electronically Signed   By: Lajean Manes M.D.   On: 03/17/2019 16:01      Scheduled Meds: .  stroke: mapping our early stages of recovery book   Does not apply Once  . apixaban  2.5 mg Oral BID  . atorvastatin  20 mg Oral q1800  . Chlorhexidine Gluconate Cloth  6 each Topical Daily  . docusate sodium  100 mg Oral Daily  . levothyroxine  25 mcg Oral Q0600  . loratadine  10 mg Oral Daily  . metoprolol succinate  25 mg Oral Daily   Continuous Infusions: . sodium chloride    . cefTRIAXone (ROCEPHIN)  IV 1 g (03/18/19 1936)     LOS: 1 day      Time spent: 45 minutes   Dessa Phi, DO [facility] 03/19/2019, 10:27 AM   Available via Epic secure chat 7am-7pm After these hours, please refer to coverage provider listed on [email or web address]

## 2019-03-19 NOTE — Progress Notes (Signed)
Foley care completed.

## 2019-03-20 ENCOUNTER — Encounter (HOSPITAL_COMMUNITY): Payer: Self-pay | Admitting: General Practice

## 2019-03-20 ENCOUNTER — Inpatient Hospital Stay (HOSPITAL_COMMUNITY): Payer: Medicare Other

## 2019-03-20 DIAGNOSIS — N390 Urinary tract infection, site not specified: Secondary | ICD-10-CM

## 2019-03-20 HISTORY — DX: Urinary tract infection, site not specified: N39.0

## 2019-03-20 LAB — URINE CULTURE: Culture: >=100000 — AB

## 2019-03-20 LAB — MRSA PCR SCREENING: MRSA by PCR: NEGATIVE

## 2019-03-20 MED ORDER — SODIUM CHLORIDE 0.9 % IV SOLN
1.0000 g | Freq: Three times a day (TID) | INTRAVENOUS | Status: DC
  Administered 2019-03-20 – 2019-03-21 (×3): 1 g via INTRAVENOUS
  Filled 2019-03-20 (×6): qty 1

## 2019-03-20 MED ORDER — GUAIFENESIN-DM 100-10 MG/5ML PO SYRP
5.0000 mL | ORAL_SOLUTION | ORAL | Status: DC | PRN
  Administered 2019-03-21: 5 mL via ORAL
  Filled 2019-03-20: qty 5

## 2019-03-20 NOTE — Progress Notes (Addendum)
STROKE TEAM PROGRESS NOTE   HISTORY OF PRESENT ILLNESS (per record) Ethan Faulkner is an 85 y.o. male with a PMHx of HTN, hypothyroidism, HLD, PAF (on 2.5mg  Eliquis) and prior stroke with dysarthria as well as prostate cancer who presented to [facility] on 10/23 with new LUE weakness, bilateral leg weakness and gait instability. NIHSS: 4 dysarthria, right leg drift, LOC   INTERVAL HISTORY His personal caregiver is at the bedside.  She reports that Ethan Faulkner has improved and near baseline debility (from prev stroke). Appears that stroke wk up is completed now.  I have personally reviewed history of presenting illness, electronic medical records and imaging films in PACS OBJECTIVE Vitals:   03/19/19 2327 03/20/19 0420 03/20/19 1100 03/20/19 1632  BP: 121/69 121/62 (!) 134/94 (!) 148/92  Pulse: 67 74 81 89  Resp: 16 16 16 16   Temp: 97.9 F (36.6 C) 98.6 F (37 C) 97.8 F (36.6 C) 98.5 F (36.9 C)  TempSrc: Axillary Oral Oral Oral  SpO2: 93% 93% 100% 96%  Weight:      Height:        CBC:  Recent Labs  Lab 03/17/19 1511 03/18/19 0538  WBC 4.6 4.9  NEUTROABS 2.6  --   HGB 13.1 12.9*  HCT 41.6 40.4  MCV 97.9 97.3  PLT 164 144*    Basic Metabolic Panel:  Recent Labs  Lab 03/17/19 1511 03/18/19 0538  NA 137 139  K 4.4 3.9  CL 107 107  CO2 22 22  GLUCOSE 106* 94  BUN 28* 25*  CREATININE 0.98 0.74  CALCIUM 9.0 9.0    Lipid Panel:     Component Value Date/Time   CHOL 103 03/18/2019 0537   CHOL 110 01/31/2018 1347   TRIG 51 03/18/2019 0537   HDL 43 03/18/2019 0537   HDL 43 01/31/2018 1347   CHOLHDL 2.4 03/18/2019 0537   VLDL 10 03/18/2019 0537   LDLCALC 50 03/18/2019 0537   LDLCALC 48 01/31/2018 1347   HgbA1c:  Lab Results  Component Value Date   HGBA1C 5.6 03/18/2019   Urine Drug Screen:     Component Value Date/Time   LABOPIA NONE DETECTED 05/21/2016 2034   COCAINSCRNUR NONE DETECTED 05/21/2016 2034   LABBENZ NONE DETECTED 05/21/2016 2034   AMPHETMU NONE  DETECTED 05/21/2016 2034   THCU NONE DETECTED 05/21/2016 2034   LABBARB NONE DETECTED 05/21/2016 2034    Alcohol Level     Component Value Date/Time   ETH <5 05/21/2016 1726    IMAGING   Mr Angio Head Wo Contrast  Result Date: 03/20/2019 CLINICAL DATA:  Stroke, follow-up EXAM: MRA HEAD WITHOUT CONTRAST TECHNIQUE: Angiographic images of the Circle of Willis were obtained using MRA technique without intravenous contrast. COMPARISON:  Brain MRI 03/18/2019, carotid artery duplex 03/18/2019, CT angiogram head/neck [id number] FINDINGS: Dolichoectasia of the proximal anterior and posterior circulation large vessels. The intracranial internal carotid arteries are patent without significant stenosis. The M1 middle cerebral arteries are patent bilaterally without significant stenosis. The bilateral proximal M2 middle cerebral artery branches are somewhat poorly assessed on the current study due to artifact. No definite high-grade proximal stenosis identified within these vessels. The anterior cerebral arteries are patent bilaterally without significant proximal stenosis. The intracranial vertebral arteries are patent bilaterally without significant stenosis, as is the basilar artery. The posterior cerebral arteries are patent bilaterally. Moderate segmental stenoses within the bilateral P2 posterior cerebral arteries were better delineated on prior CTA 02/14/2017 but appear unchanged. No intracranial aneurysm is identified.  IMPRESSION: 1. No anterior circulation large vessel occlusion or proximal high-grade arterial stenosis identified. Please note the proximal M2 middle cerebral artery branches are somewhat poorly assessed due to artifact. 2. Redemonstrated moderate segmental stenoses within the bilateral P2 posterior cerebral arteries. No other significant proximal stenosis within the posterior circulation. 3. Dolichoectasia of the proximal large vessels. Electronically Signed   By: Kellie Simmering DO   On:  03/20/2019 10:30   Mr Brain Wo Contrast  Result Date: 03/18/2019 CLINICAL DATA:  Altered level of consciousness (LOC), unexplained altered mental status, rule out CVA. EXAM: MRI HEAD WITHOUT CONTRAST TECHNIQUE: Multiplanar, multiecho pulse sequences of the brain and surrounding structures were obtained without intravenous contrast. COMPARISON:  Head CT 08/12/2018, brain MRI 02/16/2017, brain MRI 05/22/2016 FINDINGS: Brain: Multiple sequences are motion degraded, limiting evaluation. This includes moderate/severe motion degradation of the axial FLAIR sequence. There are multiple small acute cortical/subcortical infarcts along the right central sulcus, involving the right frontal lobe motor strip and right postcentral gyrus. No evidence of intracranial mass. No midline shift or extra-axial fluid collection. Redemonstrated chronic infarcts within the left frontoparietal lobes and right parietal lobe. Redemonstrated chronic lacunar infarct within the right internal capsule. There is a background of moderate/advanced chronic small vessel ischemic disease. As before, there are multiple supratentorial and infratentorial chronic microhemorrhages which are most numerous within the cerebellum. Generalized parenchymal atrophy. Vascular: Flow voids maintained within the proximal large arterial vessels. Dolichoectasia of the intracranial arteries. Skull and upper cervical spine: No focal marrow lesion. C3-C4 grade 1 anterolisthesis. Sinuses/Orbits: Visualized orbits demonstrate no acute abnormality. Minimal ethmoid sinus mucosal thickening. Trace fluid within bilateral mastoid air cells. These results were called by telephone at the time of interpretation on 03/18/2019 at 8:16 pm to provider Dr. Kennon Holter, Who verbally acknowledged these results. IMPRESSION: 1. Motion degraded examination. 2. Several small acute cortical/subcortical infarcts within the right frontal lobe motor strip and right postcentral gyrus. 3.  Redemonstrated chronic infarcts within the left frontoparietal and right parietal lobes as well as right internal capsule. 4. Generalized parenchymal atrophy with chronic small vessel ischemic disease. 5. Multifocal chronic microhemorrhage most prominent within the cerebellum. Dolichoectasia of the intracranial arteries. Findings are suggestive of chronic hypertension. Electronically Signed   By: Kellie Simmering DO   On: 03/18/2019 20:16     Transthoracic Echocardiogram   Recent Results (from the past 43800 hour(s))  ECHOCARDIOGRAM COMPLETE   Collection Time: 03/18/19  9:02 AM  Result Value   BP 142/66   Narrative     ECHOCARDIOGRAM REPORT       Patient Name:   AIDAN LINEWEAVER Date of Exam: 03/18/2019 Medical Rec #:  [id number]        Height:       68.0 in Accession #:    [id number]       Weight:       155.0 lb Date of Birth:  10-Apr-1916       BSA:          1.83 m Patient Age:    89 years        BP:           142/66 mmHg Patient Gender: M                HR:           72 bpm. Exam Location:  [facility]  Procedure: 2D Echo  Indications:    TIA 435.9 / G45.9   History:  Patient has prior history of Echocardiogram examinations, most                 recent 02/15/2017. TIA; Arrythmias:Atrial Fibrillation Risk                 Factors:Hypertension, Dyslipidemia and Former Smoker. Chronic                 anemia, Sepsis.   Sonographer:    Leavy Cella RDCS (AE) Referring Phys: [place]    1. Left ventricular ejection fraction, by visual estimation, is 55%. The left ventricle has normal function. Normal left ventricular size. There is mildly increased left ventricular hypertrophy.  2. Left ventricular diastolic Doppler parameters are indeterminate pattern of LV diastolic filling.  3. Global right ventricle has low normal systolic function.The right ventricular size is normal. No increase in right ventricular wall thickness.  4. Left atrial size was moderately  dilated.  5. Right atrial size was severely dilated.  6. Mild mitral annular calcification.  7. Moderate aortic valve annular calcification.  8. The mitral valve is degenerative. Mild mitral valve regurgitation.  9. The tricuspid valve is grossly normal. Tricuspid valve regurgitation mild-moderate. 10. The aortic valve is tricuspid Aortic valve regurgitation is mild to moderate by color flow Doppler. Mild aortic valve stenosis. 11. The pulmonic valve was grossly normal. Pulmonic valve regurgitation is not visualized by color flow Doppler. 12. Aortic dilatation noted. 13. There is mild dilatation of the aortic root measuring 37 mm. 14. Moderately elevated pulmonary artery systolic pressure. 15. The inferior vena cava is normal in size with greater than 50% respiratory variability, suggesting right atrial pressure of 3 mmHg.  FINDINGS  Left Ventricle: Left ventricular ejection fraction, by visual estimation, is 55%. The left ventricle has normal function. There is mildly increased left ventricular hypertrophy. Concentric left ventricular hypertrophy. Normal left ventricular size. The  left ventricular diastology could not be evaluatedsecondary to atrial fibrillation. Spectral Doppler shows Left ventricular diastolic Doppler parameters are indeterminate pattern of LV diastolic filling.  Right Ventricle: The right ventricular size is normal. No increase in right ventricular wall thickness. Global RV systolic function is has low normal systolic function. The tricuspid regurgitant velocity is 3.63 m/s, and with an assumed right atrial  pressure of 10 mmHg, the estimated right ventricular systolic pressure is moderately elevated at 62.7 mmHg.  Left Atrium: Left atrial size was moderately dilated.  Right Atrium: Right atrial size was severely dilated  Pericardium: There is no evidence of pericardial effusion.  Mitral Valve: The mitral valve is degenerative in appearance. There is mild thickening of  the mitral valve leaflet(s). Mild mitral annular calcification. Mild mitral valve regurgitation.  Tricuspid Valve: The tricuspid valve is grossly normal. Tricuspid valve regurgitation mild-moderate by color flow Doppler.  Aortic Valve: The aortic valve is tricuspid. Aortic valve regurgitation is mild to moderate by color flow Doppler. Aortic regurgitation PHT measures 279 msec. Mild aortic stenosis is present. Moderate aortic valve annular calcification.  Pulmonic Valve: The pulmonic valve was grossly normal. Pulmonic valve regurgitation is not visualized by color flow Doppler.  Aorta: Aortic dilatation noted. There is mild dilatation of the aortic root measuring 37 mm.  Venous: The inferior vena cava is normal in size with greater than 50% respiratory variability, suggesting right atrial pressure of 3 mmHg.  IAS/Shunts: No atrial level shunt detected by color flow Doppler.     LEFT VENTRICLE PLAX 2D LVIDd:  4.34 cm  Diastology LVIDs:         3.39 cm  LV e' lateral:   10.60 cm/s LV PW:         1.28 cm  LV E/e' lateral: 10.1 LV IVS:        1.23 cm  LV e' medial:    7.40 cm/s LVOT diam:     2.10 cm  LV E/e' medial:  14.5 LV SV:         38 ml LV SV Index:   20.53 LVOT Area:     3.46 cm    RIGHT VENTRICLE RV S prime:     10.90 cm/s TAPSE (M-mode): 1.7 cm  LEFT ATRIUM             Index       RIGHT ATRIUM           Index LA diam:        4.90 cm 2.67 cm/m  RA Area:     28.70 cm LA Vol (A2C):   80.3 ml 43.79 ml/m RA Volume:   107.00 ml 58.34 ml/m LA Vol (A4C):   53.7 ml 29.28 ml/m LA Biplane Vol: 67.8 ml 36.97 ml/m  AORTIC VALVE AI PHT:      279 msec   AORTA Ao Root diam: 3.70 cm  MITRAL VALVE                         TRICUSPID VALVE MV Area (PHT): 3.42 cm              TR Peak grad:   52.7 mmHg MV PHT:        64.38 msec            TR Vmax:        363.00 cm/s MV Decel Time: 222 msec MV E velocity: 107.00 cm/s 103 cm/s  SHUNTS MV A velocity: 26.70 cm/s  70.3 cm/s  Systemic Diam: 2.10 cm MV E/A ratio:  4.01        1.5    Kate Sable MD Electronically signed by Kate Sable MD Signature Date/Time: 03/18/2019/2:39:00 PM       Final     *Note: Due to a large number of results and/or encounters for the requested time period, some results have not been displayed. A complete set of results can be found in Results Review.   ECG - SR rate 70 BPM. (See cardiology reading for complete details)  PHYSICAL EXAM Blood pressure (!) 148/92, pulse 89, temperature 98.5 F (36.9 C), temperature source Oral, resp. rate 16, height 5\' 7"  (1.702 m), weight 72.6 kg, SpO2 96 %. Presented elderly Caucasian male not in distress HEENT-  Normocephalic, no lesions, without obvious abnormality.  Normal external eye and conjunctiva.  He is hard of hearing bilaterally. Cardiovascular-  pulses palpable throughout  Lungs- no excessive working breathing.  Extremities- Warm, dry and intact Musculoskeletal-no joint tenderness, deformity or swelling Skin-warm and dry  Neurological Examination  Mental Status: Alert, oriented to name and age, knows this is a hospital, but can't give other details. Speech hypophonic with short, dysarthric sentences. Unable to assess for errors of grammar or syntax given paucity of speech. Able to follow simple commands without difficulty. Cranial Nerves: II: Visual fields grossly normal, PERRL III,IV, VI: ptosis present bilat (likely d/t normal variant of advanced age, EOMI V,VII: smile slight asymmetric, facial light touch sensation normal bilaterally VIII: hearing normal bilaterally IX,X: Mild hypophonia XI:  Symmetric XII: midline tongue extension Motor: Right :  Upper extremity   5/5              Left:     Upper extremity   4+/5             Lower extremity   5/5                          Lower extremity   5/5 Tone and bulk:normal tone throughout; no atrophy noted Sensory:  light touch intact throughout, bilaterally Deep Tendon  Reflexes: 2+ and symmetric biceps and hypoactive patellae Cerebellar: No gross ataxia noted Gait: deferred; uses walker at baseline.   HOME MEDICATIONS:  Medications Prior to Admission  Medication Sig Dispense Refill  . amLODipine (NORVASC) 5 MG tablet TAKE (1) TABLET BY MOUTH ONCE DAILY. 30 tablet 0  . ampicillin (PRINCIPEN) 500 MG capsule Take 500 mg by mouth daily.    Marland Kitchen atorvastatin (LIPITOR) 20 MG tablet TAKE 1 TABLET BY MOUTH ONCE DAILY AT 6:00 PM. 30 tablet 4  . cetirizine (ZYRTEC) 10 MG tablet Take 10 mg by mouth every evening.     Marland Kitchen ELIQUIS 2.5 MG TABS tablet TAKE (1) TABLET BY MOUTH TWICE DAILY 9AM AND 9PM. 60 tablet 4  . levothyroxine (SYNTHROID) 25 MCG tablet TAKE 1 TABLET EVERY MORNING ON AN EMPTY STOMACH FOR THYROID. 30 tablet 4  . metoprolol succinate (TOPROL-XL) 25 MG 24 hr tablet TAKE (1) TABLET BY MOUTH ONCE DAILY. 30 tablet 5  . QC NATURA-LAX 17 GM/SCOOP powder MIX 1 CAPFUL IN 8 OZ OF WATER AND DRINK TWICE WEEKLY. 510 g 3  . traMADol (ULTRAM) 50 MG tablet TAKE ONE TABLET 3 TIMES DAILY AS NEEDED FOR SEVERE PAIN. 21 tablet 2  . azithromycin (ZITHROMAX Z-PAK) 250 MG tablet Take 2 tablets (500 mg) on  Day 1,  followed by 1 tablet (250 mg) once daily on Days 2 through 5. 6 each 0  . docusate sodium (COLACE) 100 MG capsule Take 100 mg by mouth daily.    Marland Kitchen ketoconazole (NIZORAL) 2 % cream APPLY TO AFFECTED AREAS TWICE DAILY. (Patient not taking: Reported on 03/17/2019) 60 g 1      HOSPITAL MEDICATIONS:  .  stroke: mapping our early stages of recovery book   Does not apply Once  . apixaban  2.5 mg Oral BID  . atorvastatin  20 mg Oral q1800  . Chlorhexidine Gluconate Cloth  6 each Topical Daily  . docusate sodium  100 mg Oral Daily  . levothyroxine  25 mcg Oral Q0600  . loratadine  10 mg Oral Daily  . metoprolol succinate  25 mg Oral Daily    ALLERGIES Allergies  Allergen Reactions  . Xanax [Alprazolam] Other (See Comments)    Dizzy, Sedated  . Tape Rash    PATIENT'S  SKIN WILL TEAR/PLEASE EITHER USE PAPER TAPE OR COBAN WRAP!!   ASSESSMENT/PLAN Ethan Faulkner is a 85 y.o. male with history of HTN, hypothyroidism, HLD, PAF ( on eliquis) and prior stroke with dysarthria as well as prostate cancer who presented to the hospital with LUE weakness, bilateral leg weakness and gait instability.  Stroke: Multiple small right frontal MCA branch infarcts of embolic etiology from atrial fibrillation  Resultant  Left side weakness (dysarthria at baseline from prev stroke)  Code Stroke CT Head -  Not done  CT head - prior infarcts, periventricular small vessel disease, no acute infarct  MRI  head- MRI brain reveals several small acute cortical/subcortical infarcts within the right frontal lobe motor strip and right postcentral gyrus. Redemonstrated chronic infarcts within the left frontoparietal and right parietal lobes as well as right internal capsule. Generalized parenchymal atrophy with chronic small vessel ischemic disease. Multifocal chronic microhemorrhage most prominent within the cerebellum.  MRA head - neg for intracranial stenosis.   CTA H&N - not done  CT Perfusion  Carotid Doppler- < 50% ICA stenosis bilaterally. Nonvisualized right vertebral artery suggesting occlusion. Patent antegrade left vertebral artery  2D Echo-  no mural thrombus.   Sars Corona Virus 2  neg  LDL -50    Component Value Date/Time   LDLCALC 50 03/18/2019 0537   LDLCALC 48 01/31/2018 1347     HgbA1c - 5.6  UDS not done  VTE prophylaxis - on anticoagulation at baseline Diet  Diet Order            Diet Heart Room service appropriate? Yes; Fluid consistency: Thin  Diet effective now               2.5mg  Eliquis (lower dose for age/CKD) prior to admission, now on same  Patient counseled to be compliant with his antithrombotic medications  Ongoing aggressive stroke risk factor management  Therapy recommendations:  pending  Disposition:   Pending  Hypertension  Home BP meds: Toprol  Current BP meds: Toprol  Stable . Permissive hypertension (OK if < 220/120) but gradually normalize in 5-7 days . Long-term BP goal normotensive  Hyperlipidemia  Home Lipid lowering medication: Lipitor 20mg   LDL 50, goal < 70  Current lipid lowering medication: Lipitor 20 mg daily   Continue statin at discharge  Diabetes  Home diabetic meds: none  NO h/o DM2  HgbA1c 5.6, goal < 7.0  No results for input(s): GLUCAP in the last 72 hours.  Other Stroke Risk Factors  Advanced age  Hx stroke/TIA  Coronary artery disease  Atrial fibrillation  Substance Abuse   Other Active Problems Vascular dementia- he has private duty care givers at home Thrombocytopenia UTI  Hospital day # 2  Desiree Metzger-Cihelka, ARNP-C, ANVP-BC Pager: [phone] I have personally obtained history,examined this patient, reviewed notes, independently viewed imaging studies, participated in medical decision making and plan of care.ROS completed by me personally and pertinent positives fully documented  I have made any additions or clarifications directly to the above note.  He has presented with small embolic strokes despite being compliant with Eliquis for his atrial fibrillation.  I had a long discussion with the patient regarding alternative medications to Eliquis but lack of definite proven benefit for switching to Pradaxa or alternative agents.  After discussion with patient and family recommend he stay on Eliquis.  Continue aggressive risk factor modification.  Patient will be discharged home with home health therapies.  Follow-up as an outpatient in the stroke clinic in 6 weeks.  Greater than 50% time during this 25-minute visit was spent on counseling and coordination of care about his embolic stroke and answering questions.  Stroke team will sign off  Antony Contras, MD Medical Director [facility] Pager:  [phone] 03/20/2019 5:46 PM  To contact Stroke Continuity provider, please refer to [email or web address] After hours, contact General Neurology

## 2019-03-20 NOTE — Progress Notes (Signed)
PROGRESS NOTE    Ethan Faulkner  [id number] DOB: 28-Sep-1915 DOA: 03/17/2019 PCP: Kathyrn Drown, MD     Brief Narrative:  Ethan Faulkner is a 85 year old male with a history of hypothyroidism, hypertension, hyperlipidemia, paroxysmal atrial fibrillation on Eliquis, previous stroke with dysarthria, prostate cancer presenting with left upper extremity weakness, leg weakness, and gait instability.  The patient has some difficulty providing history secondary to his previous stroke.  History was obtained from speaking with the patient's daughters at the bedside.  When the patient woke up in the a.m. on 03/17/2019, the patient had much difficulty getting out of bed due to increasing weakness.  This was unusual as he normally does not require maximal assistance.  In addition, he required maximal assistance getting to the bathroom using his walker.  Once again this is not his usual baseline.  As the day progressed, his caretakers noticed that he may have been neglecting his left side and not using his left arm/hand as much. Notably, the patient has an indwelling Foley catheter.  It was last changed approximately 2-1/2 weeks prior to this admission.  UA was significant for large leukocytes, positive nitrites, > 50 WBC.  He was started on empiric Rocephin.  He was transferred to [facility] for further stroke work-up.  New events last 24 hours / Subjective: Patient evaluated with caregiver/sitter at bedside.  Daughter was on the phone on speaker phone throughout my examination.  No new issues, was able to eat some yesterday.   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 Prior infarct at the junction of the right internal capsule and anterior, superior right thalamus. Periventricular small vessel disease evident. No acute infarct is demonstrable. No mass or hemorrhage evident. -MRI brain: Several small acute cortical/subcortical infarcts within the right frontal lobe motor strip and right postcentral gyrus. Redemonstrated chronic infarcts within the left frontoparietal and right parietal lobes as well as right internal capsule. -Carotid ultrasound: Moderate calcific atherosclerosis without hemodynamically significant stenosis. Degree of narrowing estimated less than 50% bilaterally by ultrasound criteria. -MRA with no large vessel occlusion or high-grade arterial stenosis  -Echocardiogram: EF 55% -LDL 50, hemoglobin A1c 5.6 -Continue Lipitor, Eliquis -PT OT SLP, home health PT on dc  -Neurology following   Paroxysmal atrial fibrillation -Continue Eliquis, Toprol  Essential hypertension -Continue Toprol but hold Norvasc to allow for permissive hypertension  Acute UTI, related to chronic indwelling Foley catheter which was present on admission -UA with positive nitrite, large leukocytes, > 50 WBC.  Urine culture with pseudomonas, susceptibility pending  -Blood culture negative to date -Empiric cefepime   Hypothyroidism -Continue Synthroid    DVT prophylaxis: Eliquis Code Status: DNR Family Communication: Caregiver at bedside, daughter on speakerphone during examination  Disposition Plan: Pending neuro recommendations, return home on discharge with home health   Consultants:   Neurology  Procedures:   None   Antimicrobials:  Anti-infectives (From admission, onward)   Start     Dose/Rate Route Frequency Ordered Stop   03/20/19 0745  ceFEPIme (MAXIPIME) 1 g in sodium chloride  0.9 % 100 mL IVPB     1 g 200 mL/hr over 30 Minutes Intravenous  Every 8 hours 03/20/19 0743     03/17/19 2000  cefTRIAXone (ROCEPHIN) 1 g in sodium chloride 0.9 % 100 mL IVPB  Status:  Discontinued     1 g 200 mL/hr over 30 Minutes Intravenous Every 24 hours 03/17/19 1934 03/20/19 0740   03/17/19 1900  ciprofloxacin (CIPRO) IVPB 400 mg     400 mg 200 mL/hr over 60 Minutes Intravenous  Once 03/17/19 1859 03/17/19 2112       Objective: Vitals:   03/19/19 1209 03/19/19 2004 03/19/19 2327 03/20/19 0420  BP: 130/72 137/72 121/69 121/62  Pulse: 76 67 67 74  Resp: 18 16 16 16   Temp: 98.5 F (36.9 C) (!) 97.4 F (36.3 C) 97.9 F (36.6 C) 98.6 F (37 C)  TempSrc: Oral Oral Axillary Oral  SpO2: 98% 94% 93% 93%  Weight:      Height:        Intake/Output Summary (Last 24 hours) at 03/20/2019 1117 Last data filed at 03/20/2019 0900 Gross per 24 hour  Intake 1104.88 ml  Output 800 ml  Net 304.88 ml   Filed Weights   03/19/19 0453  Weight: 72.6 kg    Examination: General exam: Appears calm and comfortable  Respiratory system: Clear to auscultation. Respiratory effort normal. Cardiovascular system: S1 & S2 heard, RRR. No pedal edema. Gastrointestinal system: Abdomen is nondistended, soft and nontender. Normal bowel sounds heard. Central nervous system: Alert. Able to move all extremities, +dysarthria which per caregiver is baseline  Extremities: Symmetric in appearance bilaterally  Skin: No rashes, lesions or ulcers on exposed skin   Data Reviewed: I have personally reviewed following labs and imaging studies  CBC: Recent Labs  Lab 03/17/19 1511 03/18/19 0538  WBC 4.6 4.9  NEUTROABS 2.6  --   HGB 13.1 12.9*  HCT 41.6 40.4  MCV 97.9 97.3  PLT 164 [id number]*   Basic Metabolic Panel: Recent Labs  Lab 03/17/19 1511 03/18/19 0538  NA 137 139  K 4.4 3.9  CL 107 107  CO2 22 22  GLUCOSE 106* 94  BUN 28* 25*  CREATININE 0.98 0.74  CALCIUM 9.0 9.0   GFR: Estimated Creatinine Clearance: 45.9 mL/min (by C-G formula based on SCr of 0.74  mg/dL). Liver Function Tests: Recent Labs  Lab 03/17/19 1511 03/18/19 0538  AST 19 16  ALT 16 14  ALKPHOS 57 54  BILITOT 1.4* 1.5*  PROT 7.1 6.5  ALBUMIN 4.1 3.8   No results for input(s): LIPASE, AMYLASE in the last 168 hours. No results for input(s): AMMONIA in the last 168 hours. Coagulation Profile: No results for input(s): INR, PROTIME in the last 168 hours. Cardiac Enzymes: No results for input(s): CKTOTAL, CKMB, CKMBINDEX, TROPONINI in the last 168 hours. BNP (last 3 results) No results for input(s): PROBNP in the last 8760 hours. HbA1C: Recent Labs    03/18/19 0537  HGBA1C 5.6   CBG: No results for input(s): GLUCAP in the last 168 hours. Lipid Profile: Recent Labs    03/18/19 0537  CHOL 103  HDL 43  LDLCALC 50  TRIG 51  CHOLHDL 2.4   Thyroid Function Tests: No results for input(s): TSH, T4TOTAL, FREET4, T3FREE, THYROIDAB in the last 72 hours. Anemia Panel: No results for input(s): VITAMINB12, FOLATE, FERRITIN, TIBC, IRON, RETICCTPCT in the last 72 hours. Sepsis Labs: No results for input(s): PROCALCITON, LATICACIDVEN in the last 168 hours.  Recent  Results (from the past 240 hour(s))  Urine Culture     Status: Abnormal (Preliminary result)   Collection Time: 03/17/19  5:38 PM   Specimen: Urine, Clean Catch  Result Value Ref Range Status   Specimen Description   Final    URINE, CLEAN CATCH Performed at [facility], [address], [place], [place] [address]    Special Requests   Final    NONE Performed at [facility], [address], [place], [place] [address]    Culture (A)  Final    >=100,000 COLONIES/mL PSEUDOMONAS AERUGINOSA SUSCEPTIBILITIES TO FOLLOW Performed at [facility], [place] [address] [place], [place] [address]    Report Status PENDING  Incomplete  SARS CORONAVIRUS 2 (TAT 6-24 HRS) Nasopharyngeal Urine, Catheterized     Status: None   Collection Time: 03/17/19  8:00 PM   Specimen: Urine, Catheterized; Nasopharyngeal  Result  Value Ref Range Status   SARS Coronavirus 2 NEGATIVE NEGATIVE Final    Comment: (NOTE) SARS-CoV-2 target nucleic acids are NOT DETECTED. The SARS-CoV-2 RNA is generally detectable in upper and lower respiratory specimens during the acute phase of infection. Negative results do not preclude SARS-CoV-2 infection, do not rule out co-infections with other pathogens, and should not be used as the sole basis for treatment or other patient management decisions. Negative results must be combined with clinical observations, patient history, and epidemiological information. The expected result is Negative. Fact Sheet for Patients: SugarRoll.be Fact Sheet for Healthcare Providers: https://www.woods-mathews.com/ This test is not yet approved or cleared by the [place] FDA and  has been authorized for detection and/or diagnosis of SARS-CoV-2 by FDA under an Emergency Use Authorization (EUA). This EUA will remain  in effect (meaning this test can be used) for the duration of the COVID-19 declaration under Section 56 4(b)(1) of the Act, 21 U.S.C. section 360bbb-3(b)(1), unless the authorization is terminated or revoked sooner. Performed at [facility], [place] [address], [place], [place] [address]   Culture, blood (Routine X 2) w Reflex to ID Panel     Status: None (Preliminary result)   Collection Time: 03/17/19  8:14 PM   Specimen: Right Antecubital; Blood  Result Value Ref Range Status   Specimen Description RIGHT ANTECUBITAL  Final   Special Requests   Final    BOTTLES DRAWN AEROBIC AND ANAEROBIC Blood Culture adequate volume   Culture   Final    NO GROWTH 3 DAYS Performed at [facility], [address], [place], [place] [address]    Report Status PENDING  Incomplete  Culture, blood (Routine X 2) w Reflex to ID Panel     Status: None (Preliminary result)   Collection Time: 03/17/19  8:15 PM   Specimen: Left Antecubital; Blood  Result  Value Ref Range Status   Specimen Description LEFT ANTECUBITAL  Final   Special Requests   Final    BOTTLES DRAWN AEROBIC AND ANAEROBIC Blood Culture adequate volume   Culture   Final    NO GROWTH 3 DAYS Performed at [facility], [address], [place], [place] [address]    Report Status PENDING  Incomplete      Radiology Studies: Mr Angio Head Wo Contrast  Result Date: 03/20/2019 CLINICAL DATA:  Stroke, follow-up EXAM: MRA HEAD WITHOUT CONTRAST TECHNIQUE: Angiographic images of the Circle of Willis were obtained using MRA technique without intravenous contrast. COMPARISON:  Brain MRI 03/18/2019, carotid artery duplex 03/18/2019, CT angiogram head/neck [id number] FINDINGS: Dolichoectasia of the proximal anterior  and posterior circulation large vessels. The intracranial internal carotid arteries are patent without significant stenosis. The M1 middle cerebral arteries are patent bilaterally without significant stenosis. The bilateral proximal M2 middle cerebral artery branches are somewhat poorly assessed on the current study due to artifact. No definite high-grade proximal stenosis identified within these vessels. The anterior cerebral arteries are patent bilaterally without significant proximal stenosis. The intracranial vertebral arteries are patent bilaterally without significant stenosis, as is the basilar artery. The posterior cerebral arteries are patent bilaterally. Moderate segmental stenoses within the bilateral P2 posterior cerebral arteries were better delineated on prior CTA 02/14/2017 but appear unchanged. No intracranial aneurysm is identified. IMPRESSION: 1. No anterior circulation large vessel occlusion or proximal high-grade arterial stenosis identified. Please note the proximal M2 middle cerebral artery branches are somewhat poorly assessed due to artifact. 2. Redemonstrated moderate segmental stenoses within the bilateral P2 posterior cerebral arteries. No other significant  proximal stenosis within the posterior circulation. 3. Dolichoectasia of the proximal large vessels. Electronically Signed   By: Kellie Simmering DO   On: 03/20/2019 10:30   Mr Brain Wo Contrast  Result Date: 03/18/2019 CLINICAL DATA:  Altered level of consciousness (LOC), unexplained altered mental status, rule out CVA. EXAM: MRI HEAD WITHOUT CONTRAST TECHNIQUE: Multiplanar, multiecho pulse sequences of the brain and surrounding structures were obtained without intravenous contrast. COMPARISON:  Head CT 08/12/2018, brain MRI 02/16/2017, brain MRI 05/22/2016 FINDINGS: Brain: Multiple sequences are motion degraded, limiting evaluation. This includes moderate/severe motion degradation of the axial FLAIR sequence. There are multiple small acute cortical/subcortical infarcts along the right central sulcus, involving the right frontal lobe motor strip and right postcentral gyrus. No evidence of intracranial mass. No midline shift or extra-axial fluid collection. Redemonstrated chronic infarcts within the left frontoparietal lobes and right parietal lobe. Redemonstrated chronic lacunar infarct within the right internal capsule. There is a background of moderate/advanced chronic small vessel ischemic disease. As before, there are multiple supratentorial and infratentorial chronic microhemorrhages which are most numerous within the cerebellum. Generalized parenchymal atrophy. Vascular: Flow voids maintained within the proximal large arterial vessels. Dolichoectasia of the intracranial arteries. Skull and upper cervical spine: No focal marrow lesion. C3-C4 grade 1 anterolisthesis. Sinuses/Orbits: Visualized orbits demonstrate no acute abnormality. Minimal ethmoid sinus mucosal thickening. Trace fluid within bilateral mastoid air cells. These results were called by telephone at the time of interpretation on 03/18/2019 at 8:16 pm to provider Dr. Kennon Holter, Who verbally acknowledged these results. IMPRESSION: 1. Motion degraded  examination. 2. Several small acute cortical/subcortical infarcts within the right frontal lobe motor strip and right postcentral gyrus. 3. Redemonstrated chronic infarcts within the left frontoparietal and right parietal lobes as well as right internal capsule. 4. Generalized parenchymal atrophy with chronic small vessel ischemic disease. 5. Multifocal chronic microhemorrhage most prominent within the cerebellum. Dolichoectasia of the intracranial arteries. Findings are suggestive of chronic hypertension. Electronically Signed   By: Kellie Simmering DO   On: 03/18/2019 20:16      Scheduled Meds:   stroke: mapping our early stages of recovery book   Does not apply Once   apixaban  2.5 mg Oral BID   atorvastatin  20 mg Oral q1800   Chlorhexidine Gluconate Cloth  6 each Topical Daily   docusate sodium  100 mg Oral Daily   levothyroxine  25 mcg Oral Q0600   loratadine  10 mg Oral Daily   metoprolol succinate  25 mg Oral Daily   Continuous Infusions:  sodium chloride 75 mL/hr at 03/20/19 0300  ceFEPime (MAXIPIME) IV 1 g (03/20/19 1037)     LOS: 2 days      Time spent: 25 minutes   Dessa Phi, DO [facility] 03/20/2019, 11:17 AM   Available via Epic secure chat 7am-7pm After these hours, please refer to coverage provider listed on [email or web address]

## 2019-03-20 NOTE — Progress Notes (Signed)
Occupational Therapy Treatment Patient Details Name: Ethan Faulkner MRN: [id number] DOB: Apr 10, 1916 Today's Date: 03/20/2019    History of present illness Ethan Faulkner  is a 85 y.o. male, w hypothyroidism, hypertension, hyperlipidemia, Pafib, h/o stroke w baseline slurred speech, presents with weakness of the left arm. Imaging revealed several small acute cortical/subcortical infarcts within the right front lobe and right postcentral gyrus.   OT comments  Pt progressing toward established goals. Pt currently requires minA to [place] for support while standing at sink level to complete grooming activity. Pt required modA to powerup into standing and minA at RW level for functional mobility. Pt will continue to benefit from skilled OT services to maximize safety and independence with ADL/IADL and functional mobility. Will continue to follow acutely and progress as tolerated.      Follow Up Recommendations  Supervision/Assistance - 24 hour;No OT follow up    [facility] bed    Recommendations for Other Services      Precautions / Restrictions Precautions Precautions: Fall Restrictions Weight Bearing Restrictions: No       Mobility Bed Mobility Overal bed mobility: Needs Assistance Bed Mobility: Supine to Sit     Supine to sit: Min assist;HOB elevated Sit to supine: Min assist   General bed mobility comments: minA to progress trunk to upright position and multimodal cues to progress hips to EOB  Transfers Overall transfer level: Needs assistance Equipment used: Rolling walker (2 wheeled) Transfers: Sit to/from Stand Sit to Stand: Mod assist Stand pivot transfers: Min assist       General transfer comment: modA to power up from chair, pt with initial retro-lean into trunk and knee flexion    Balance Overall balance assessment: Needs assistance Sitting-balance support: No upper extremity supported;Feet supported Sitting balance-Leahy Scale:  Fair Sitting balance - Comments: sitting EOB   Standing balance support: During functional activity;No upper extremity supported Standing balance-Leahy Scale: Poor Standing balance comment: minA to modA for support in standing at sink level                            ADL either performed or assessed with clinical judgement   ADL Overall ADL's : Needs assistance/impaired     Grooming: Wash/dry hands;Standing;Minimal assistance;Moderate assistance Grooming Details (indicate cue type and reason): at sink level                 Toilet Transfer: Minimal assistance;Ambulation;RW Toilet Transfer Details (indicate cue type and reason): simulated to sink and then to walker         Functional mobility during ADLs: Minimal assistance;Rolling walker General ADL Comments: pt required minA for functional mobility at RW level;pt receives maxA for LB ADL at baseline;per sitter, pt is close to baseline     Vision   Vision Assessment?: No apparent visual deficits   Perception     Praxis      Cognition Arousal/Alertness: Awake/alert Behavior During Therapy: Flat affect Overall Cognitive Status: History of cognitive impairments - at baseline                                 General Comments: hired Actuary present, reports he is at his baseline        Exercises     Shoulder Instructions       General Comments VSS sitter present throughout session    Pertinent Vitals/ Pain  Pain Assessment: No/denies pain  Home Living                                          Prior Functioning/Environment              Frequency  Min 3X/week        Progress Toward Goals  OT Goals(current goals can now be found in the care plan section)  Progress towards OT goals: Progressing toward goals  Acute Rehab OT Goals Patient Stated Goal: return home OT Goal Formulation: With patient Time For Goal Achievement: 04/02/19 Potential to  Achieve Goals: Good ADL Goals Pt Will Perform Eating: with set-up;sitting Pt Will Perform Grooming: with min assist;sitting Pt Will Perform Upper Body Dressing: with min assist;sitting Pt Will Perform Lower Body Dressing: with mod assist;sit to/from stand Pt Will Transfer to Toilet: with min assist;ambulating;regular height toilet Pt Will Perform Toileting - Clothing Manipulation and hygiene: with mod assist;sit to/from stand  Plan Discharge plan remains appropriate    Co-evaluation                 AM-PAC OT "6 Clicks" Daily Activity     Outcome Measure   Help from another person eating meals?: None Help from another person taking care of personal grooming?: A Lot Help from another person toileting, which includes using toliet, bedpan, or urinal?: A Lot Help from another person bathing (including washing, rinsing, drying)?: A Lot Help from another person to put on and taking off regular upper body clothing?: A Lot Help from another person to put on and taking off regular lower body clothing?: A Lot 6 Click Score: 14    End of Session Equipment Utilized During Treatment: Rolling walker;Gait belt  OT Visit Diagnosis: Unsteadiness on feet (R26.81);Other abnormalities of gait and mobility (R26.89);Muscle weakness (generalized) (M62.81);Other symptoms and signs involving the nervous system (R29.898);Other symptoms and signs involving cognitive function;Pain   Activity Tolerance Patient tolerated treatment well   Patient Left in chair;with call bell/phone within reach;with chair alarm set;with family/visitor present   Nurse Communication Mobility status        Time: 1345-1407 OT Time Calculation (min): 22 min  Charges: OT General Charges $OT Visit: 1 Visit OT Treatments $Self Care/Home Management : 8-22 mins  Dorinda Hill OTR/L [place] Office: [place] 03/20/2019, 2:18 PM

## 2019-03-20 NOTE — TOC Initial Note (Signed)
Transition of Care [facility]) - Initial/Assessment Note    Patient Details  Name: Ethan Faulkner MRN: [id number] Date of Birth: 1915/06/03  Transition of Care [facility]) CM/SW Contact:    Pollie Friar, RN Phone Number: 03/20/2019, 11:04 AM  Clinical Narrative:                 Pt lives at his home with caregivers 8a-6p and then family rotates and stays 6p-8a every day. Pt has all needed DME at the home except a hospital bed that they are requesting. They asked that [facility] provide the bed but they are not able to do so d/t the pandemic. CM spoke with daughter and she is open to using [employer]. CM reached out to [facility] with [employer] and they will deliver the bed to the home.  Pt has transport home when medically ready.  Expected Discharge Plan: [place] Barriers to Discharge: Continued Medical Work up   Patient Goals and CMS Choice   CMS Medicare.gov Compare Post Acute Care list provided to:: Patient Represenative (must comment) Choice offered to / list presented to : Adult Children(daughter--[name])  Expected Discharge Plan and Services Expected Discharge Plan: [place]   Discharge Planning Services: CM Consult Post Acute Care Choice: Home Health, Durable Medical Equipment Living arrangements for the past 2 months: [place]                 DME Arranged: Hospital bed DME Agency: [employer] Date DME Agency Contacted: 03/20/19   Representative spoke with at DME Agency: [place]: PT [place]: [place] ([place]) Date [place]: 03/20/19   Representative spoke with at [place]: Butch Penny  Prior Living Arrangements/Services Living arrangements for the past 2 months: [place]   Patient language and need for interpreter reviewed:: Yes(no needs) Do you feel safe going back to the place where you live?: Yes      Need for Family Participation in Patient Care: Yes (Comment)(24 hour  supervision) Care giver support system in place?: Yes (comment)(family and aides provide 24 hour supervision and assistance) Current home services: Homehealth aide(pt has caregivers 8am -6 pm and then family rotates staying the night) Criminal Activity/Legal Involvement Pertinent to Current Situation/Hospitalization: No - Comment as needed  Activities of Daily Living Home Assistive Devices/Equipment: Dentures (specify type), Grab bars in shower, Grab bars around toilet, Walker (specify type) ADL Screening (condition at time of admission) Patient's cognitive ability adequate to safely complete daily activities?: Yes Is the patient deaf or have difficulty hearing?: Yes Does the patient have difficulty seeing, even when wearing glasses/contacts?: Yes Does the patient have difficulty concentrating, remembering, or making decisions?: No Patient able to express need for assistance with ADLs?: Yes Does the patient have difficulty dressing or bathing?: Yes Independently performs ADLs?: No Communication: Independent Is this a change from baseline?: Pre-admission baseline Dressing (OT): Needs assistance Is this a change from baseline?: Pre-admission baseline Grooming: Needs assistance Is this a change from baseline?: Pre-admission baseline Feeding: Needs assistance Is this a change from baseline?: Pre-admission baseline Bathing: Needs assistance Is this a change from baseline?: Pre-admission baseline Toileting: Needs assistance Is this a change from baseline?: Pre-admission baseline In/Out Bed: Needs assistance Is this a change from baseline?: Pre-admission baseline Walks in Home: Needs assistance Is this a change from baseline?: Pre-admission baseline Does the patient have difficulty walking or climbing stairs?: Yes Weakness of Legs: Both Weakness of Arms/Hands: Both  Permission  Sought/Granted                  Emotional Assessment Appearance:: Appears stated  age Attitude/Demeanor/Rapport: Other (comment)(very quiet) Affect (typically observed): Calm, Quiet Orientation: : Oriented to Self, Oriented to Place   Psych Involvement: No (comment)  Admission diagnosis:  TIA (transient ischemic attack) [G45.9] Acute cystitis with hematuria [N30.01] Left hemiparesis ([place]) [G81.94] Patient Active Problem List   Diagnosis Date Noted  . Left hemiparesis ([place]) 03/18/2019  . Acute cystitis with hematuria   . Sepsis ([place]) 05/06/2018  . Hyperbilirubinemia 05/06/2018  . Chronic anemia 05/06/2018  . Hyponatremia 05/06/2018  . Physical deconditioning 05/06/2018  . HTN (hypertension) 05/06/2018  . Hypokalemia 12/08/2017  . Nausea and vomiting   . Ileus ([place])   . Urinary tract infection associated with indwelling urethral catheter ([employer])   . Small bowel obstruction ([place]) 12/05/2017  . Acute lower UTI 12/05/2017  . Hyperlipidemia 11/09/2017  . Gross hematuria 04/06/2017  . History of stroke 04/06/2017  . Hematuria, unspecified 02/19/2017  . Cerebrovascular accident (CVA) due to embolism of left middle cerebral artery ([place]) 02/14/2017  . 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 Leukopenia   . Thrombocytopenia ([place])   . Fall   . Acute blood loss anemia   . Femoral neck fracture ([place]) 11/20/2015  . Renal malignant tumor ([place]) 06/06/2015  . Long term current use of anticoagulant therapy 04/29/2015  . Hypothyroidism 03/01/2014  . Atrial fibrillation ([place]) 06/09/2013  . STRESS FRACTURE, FOOT 12/12/2008  . CLOSED FRACTURE OF METATARSAL BONE 12/12/2008   PCP:  Kathyrn Drown, MD Pharmacy:   [place], [place] [id number] [place] [id number] Phone: [phone] Fax: [place], [place] [address] AT [address] [place] [address] Phone: [phone] Fax:  [phone]     Social Determinants of Health (SDOH) Interventions    Readmission Risk Interventions No flowsheet data found.

## 2019-03-20 NOTE — Progress Notes (Signed)
Patient off floor for procedure 

## 2019-03-20 NOTE — Progress Notes (Signed)
Length of Need Lifetime   Patient has (list medical condition): Stroke   The above medical condition requires: Patient requires the ability to reposition frequently   Head must be elevated greater than: 30 degrees   Bed type Semi-electric

## 2019-03-20 NOTE — Progress Notes (Signed)
Physical Therapy Treatment Patient Details Name: Ethan Faulkner MRN: [id number] DOB: 12/23/1915 Today's Date: 03/20/2019    History of Present Illness Ethan Faulkner  is a 85 y.o. male, w hypothyroidism, hypertension, hyperlipidemia, Pafib, h/o stroke w baseline slurred speech, presents with weakness of the left arm. Imaging revealed several small acute cortical/subcortical infarcts within the right front lobe and right postcentral gyrus.    PT Comments    Pt received sitting up in chair. Pt modA for sit to stand and ambulation with RW 110'. Pt with strong veering to the R requiring modA for walker management. Pt fatigues quickly with progressive trunk and knee flexion. Per sitter (hired by family) this is near baseline, slightly weaker. Pt is getting pt a hospital bed and HHPT. Acute PT to cont to follow.    Follow Up Recommendations  Home health PT;Supervision/Assistance - 24 hour;Supervision for mobility/OOB     Equipment Recommendations  Hospital bed    Recommendations for Other Services       Precautions / Restrictions Precautions Precautions: Fall Restrictions Weight Bearing Restrictions: No    Mobility  Bed Mobility Overal bed mobility: Needs Assistance Bed Mobility: Sit to Supine       Sit to supine: Min assist   General bed mobility comments: pt required minA to bring LEs back into bed, pt able to scoot up EOB towards HOB  Transfers Overall transfer level: Needs assistance Equipment used: Rolling walker (2 wheeled) Transfers: Sit to/from Stand Sit to Stand: Mod assist         General transfer comment: modA to power up from chair, pt with initial retro-lean into trunk and knee flexion  Ambulation/Gait Ambulation/Gait assistance: Min assist;Mod assist Gait Distance (Feet): 110 Feet Assistive device: Rolling walker (2 wheeled) Gait Pattern/deviations: Decreased step length - right;Decreased step length - left;Decreased stance time - left;Decreased  dorsiflexion - left;Scissoring Gait velocity: decreased Gait velocity interpretation: <1.31 ft/sec, indicative of household ambulator General Gait Details: pt vearing to the R, max verbal cues to try to stay in walker, pt pushes walker to far in front and has a very narrow base of support, decreased step height bilaterally and worsening shuffling pattern with onset of faitgue, pt with progressive trunk flexion    Stairs             Wheelchair Mobility    Modified Rankin (Stroke Patients Only) Modified Rankin (Stroke Patients Only) Pre-Morbid Rankin Score: Moderate disability Modified Rankin: Moderately severe disability     Balance Overall balance assessment: Needs assistance Sitting-balance support: No upper extremity supported;Feet supported Sitting balance-Leahy Scale: Fair Sitting balance - Comments: sitting EOB   Standing balance support: Bilateral upper extremity supported;During functional activity Standing balance-Leahy Scale: Poor Standing balance comment: required BUE support on RUE and min A for standing balance and functional mobility                            Cognition Arousal/Alertness: Awake/alert Behavior During Therapy: Flat affect Overall Cognitive Status: History of cognitive impairments - at baseline                                 General Comments: hired Actuary present, reports he is at his baseline      Exercises      General Comments General comments (skin integrity, edema, etc.): VSS      Pertinent Vitals/Pain Pain Assessment: No/denies  pain    Home Living                      Prior Function            PT Goals (current goals can now be found in the care plan section) Acute Rehab PT Goals Patient Stated Goal: return home Progress towards PT goals: Progressing toward goals    Frequency    Min 5X/week      PT Plan Current plan remains appropriate    Co-evaluation               AM-PAC PT "6 Clicks" Mobility   Outcome Measure  Help needed turning from your back to your side while in a flat bed without using bedrails?: A Little Help needed moving from lying on your back to sitting on the side of a flat bed without using bedrails?: A Little Help needed moving to and from a bed to a chair (including a wheelchair)?: A Little Help needed standing up from a chair using your arms (e.g., wheelchair or bedside chair)?: A Lot Help needed to walk in hospital room?: A Lot Help needed climbing 3-5 steps with a railing? : A Lot 6 Click Score: 15    End of Session Equipment Utilized During Treatment: Gait belt Activity Tolerance: Patient tolerated treatment well;Patient limited by fatigue Patient left: with call bell/phone within reach;with family/visitor present;in bed Nurse Communication: Mobility status PT Visit Diagnosis: Unsteadiness on feet (R26.81);Other abnormalities of gait and mobility (R26.89);Muscle weakness (generalized) (M62.81)     Time: [id number] PT Time Calculation (min) (ACUTE ONLY): 23 min  Charges:  $Gait Training: 8-22 mins                     Kittie Plater, PT, DPT Acute Rehabilitation Services Pager #: [phone] Office #: [phone]    Ethan Faulkner 03/20/2019, 12:19 PM

## 2019-03-20 NOTE — Evaluation (Signed)
Speech Language Pathology Evaluation Patient Details Name: Ethan Faulkner MRN: [id number] DOB: 04-18-1916 Today's Date: 03/20/2019 Time: [id number] SLP Time Calculation (min) (ACUTE ONLY): 16 min  Problem List:  Patient Active Problem List   Diagnosis Date Noted  . Left hemiparesis ([place]) 03/18/2019  . Acute cystitis with hematuria   . Sepsis ([place]) 05/06/2018  . Hyperbilirubinemia 05/06/2018  . Chronic anemia 05/06/2018  . Hyponatremia 05/06/2018  . Physical deconditioning 05/06/2018  . HTN (hypertension) 05/06/2018  . Hypokalemia 12/08/2017  . Nausea and vomiting   . Ileus ([place])   . Urinary tract infection associated with indwelling urethral catheter ([place])   . Small bowel obstruction ([place]) 12/05/2017  . Acute lower UTI 12/05/2017  . Hyperlipidemia 11/09/2017  . Gross hematuria 04/06/2017  . History of stroke 04/06/2017  . Hematuria, unspecified 02/19/2017  . Cerebrovascular accident (CVA) due to embolism of left middle cerebral artery ([place]) 02/14/2017  . 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 Leukopenia   . Thrombocytopenia ([place])   . Fall   . Acute blood loss anemia   . Femoral neck fracture ([place]) 11/20/2015  . Renal malignant tumor ([place]) 06/06/2015  . Long term current use of anticoagulant therapy 04/29/2015  . Hypothyroidism 03/01/2014  . Atrial fibrillation ([place]) 06/09/2013  . STRESS FRACTURE, FOOT 12/12/2008  . CLOSED FRACTURE OF METATARSAL BONE 12/12/2008   Past Medical History:  Past Medical History:  Diagnosis Date  . Afib ([place])   . Atrial fibrillation ([place])   . Cerebral infarction ([place])   . DDD (degenerative disc disease), lumbar   . Dysphagia   . Hypertension   . Hypothyroidism   . Migraine    "maybe monthly" (11/21/2015)  . Prostate cancer ([place]) ~ 1995   S/P radiation tx  . Renal mass    "slow growing something; doctors just watching it right now" (11/21/2015)  . Stroke [facility])    TIA  . Thrombocytopenia ([place])   . Thyroid  disease    hypothyroid   Past Surgical History:  Past Surgical History:  Procedure Laterality Date  . COLONOSCOPY  1999  . ESOPHAGOGASTRODUODENOSCOPY    . LAPAROSCOPIC CHOLECYSTECTOMY  1980s  . PROSTATE BIOPSY  ~ 1995  . TOTAL HIP ARTHROPLASTY Left 11/22/2015   Procedure: LEFT HIP HEMIARTHROPLASTY ;  Surgeon: Leandrew Koyanagi, MD;  Location: [facility];  Service: Orthopedics;  Laterality: Left;   HPI:  Ethan Faulkner  is a 85 y.o. male, w hypothyroidism, hypertension, hyperlipidemia, Pafib, h/o stroke w baseline slurred speech, presents with weakness of the left arm. Imaging revealed several small acute cortical/subcortical infarcts within the right front lobe and right postcentral gyrus   Assessment / Plan / Recommendation Clinical Impression  Pt encountered in chair joined by caregiver of 2 years. She reported he is at baseline prior to recent admission with previous severe dysarthria, full assist with all ADLs, and memory deficits 2/2 previous CVAs. Previous 24 hour supervision at home with alternating caregivers to provide support with ADLs. SLE revealed pt oriented to self (residential town, birthday, age, birthday), not month or year. Pt achieved 3/3 on functional object naming task. He followed all one-step directions during oral motor exam which revealed severe facial droop at rest, mild droop during smile. Volitional tongue tremor noted. Pts speech is severely dysarthric, reportedly congruent with baseline, and is characterized by repetitions, apraxia, and dysfluencies. Caregiver able to understand pts speech. Pt does not require continued SLP services due to [facility] of deficits from baseline with no newly  reported deficits.     SLP Assessment  SLP Recommendation/Assessment: Patient does not need any further Speech Lanaguage Pathology Services SLP Visit Diagnosis: Dysarthria and anarthria (R47.1);Cognitive communication deficit (R41.841)    Follow Up Recommendations  24 hour  supervision/assistance    Frequency and Duration           SLP Evaluation Cognition  Overall Cognitive Status: History of cognitive impairments - at baseline Arousal/Alertness: Awake/alert Orientation Level: Oriented to person;Disoriented to place;Disoriented to time Attention: (P) Sustained Selective Attention: Appears intact Memory: Impaired Memory Impairment: Decreased short term memory Decreased Short Term Memory: Verbal basic Awareness: Appears intact Problem Solving: Impaired Problem Solving Impairment: Verbal complex;Functional complex Safety/Judgment: Impaired       Comprehension  Auditory Comprehension Overall Auditory Comprehension: Impaired at baseline Yes/No Questions: Not tested Commands: Impaired One Step Basic Commands: 75-100% accurate Conversation: Simple Interfering Components: Hearing;Motor planning EffectiveTechniques: Increased volume;Slowed speech Visual Recognition/Discrimination Discrimination: Not tested Reading Comprehension Reading Status: Within funtional limits    Expression Expression Primary Mode of Expression: Verbal Verbal Expression Overall Verbal Expression: Impaired at baseline Initiation: Impaired Automatic Speech: Social Response Level of Generative/Spontaneous Verbalization: Phrase Repetition: (NT) Naming: No impairment Pragmatics: No impairment Interfering Components: Speech intelligibility Effective Techniques: Sentence completion Non-Verbal Means of Communication: Not applicable Written Expression Dominant Hand: Right Written Expression: Not tested   Oral / Motor  Oral Motor/Sensory Function Overall Oral Motor/Sensory Function: (P) Moderate impairment Facial ROM: Reduced left Facial Symmetry: Abnormal symmetry left Facial Strength: Reduced left Lingual ROM: Other (Comment)(tremulous) Lingual Symmetry: Within Functional Limits Lingual Strength: Reduced Velum: Within Functional Limits Mandible: Within Functional  Limits Motor Speech Overall Motor Speech: Impaired at baseline Respiration: Within functional limits Phonation: Hoarse Resonance: Within functional limits Articulation: Impaired Level of Impairment: Word Intelligibility: Intelligibility reduced Word: 25-49% accurate Phrase: 0-24% accurate Sentence: 0-24% accurate Conversation: Not tested Motor Planning: Impaired Level of Impairment: Word Motor Speech Errors: Consistent;Aware Interfering Components: Premorbid status   GO                    Adeoluwa Silvers 03/20/2019, 3:34 PM

## 2019-03-20 NOTE — Progress Notes (Signed)
Pharmacy Antibiotic Note  Ethan Faulkner is a 85 y.o. male admitted on 03/17/2019 with UTI.  Pharmacy has been consulted for Cefepime dosing.  CrCL 46 ml/min.  Plan: Cefepime 1 gram IV q8hr Monitor renal function, clinical status and C&S.  Height: 5\' 7"  (170.2 cm) Weight: 160 lb 0.9 oz (72.6 kg) IBW/kg (Calculated) : 66.1  Temp (24hrs), Avg:98.2 F (36.8 C), Min:97.4 F (36.3 C), Max:98.7 F (37.1 C)  Recent Labs  Lab 03/17/19 1511 03/18/19 0538  WBC 4.6 4.9  CREATININE 0.98 0.74    Estimated Creatinine Clearance: 45.9 mL/min (by C-G formula based on SCr of 0.74 mg/dL).    Allergies  Allergen Reactions  . Xanax [Alprazolam] Other (See Comments)    Dizzy, Sedated  . Tape Rash    PATIENT'S SKIN WILL TEAR/PLEASE EITHER USE PAPER TAPE OR COBAN WRAP!!    Antimicrobials this admission: Cefepime 10/26 >>   Thank you for allowing pharmacy to be a part of this patient's care.  Alanda Slim, PharmD, [facility] Clinical Pharmacist Please see AMION for all Pharmacists' Contact Phone Numbers 03/20/2019, 7:44 AM

## 2019-03-21 MED ORDER — CIPROFLOXACIN HCL 250 MG PO TABS: 250.0000 mg | ORAL_TABLET | Freq: Two times a day (BID) | ORAL | 0 refills | Status: AC

## 2019-03-21 MED ORDER — TRAMADOL HCL 50 MG PO TABS
50.0000 mg | ORAL_TABLET | Freq: Four times a day (QID) | ORAL | Status: DC | PRN
  Administered 2019-03-21: 50 mg via ORAL

## 2019-03-21 MED ORDER — GUAIFENESIN-DM 100-10 MG/5ML PO SYRP: 5.0000 mL | ORAL_SOLUTION | ORAL | 0 refills | Status: DC | PRN

## 2019-03-21 MED ORDER — CIPROFLOXACIN HCL 500 MG PO TABS
250.0000 mg | ORAL_TABLET | Freq: Two times a day (BID) | ORAL | Status: DC
  Administered 2019-03-21: 10:00:00 250 mg via ORAL
  Filled 2019-03-21: qty 1

## 2019-03-21 NOTE — Discharge Summary (Signed)
Physician Discharge Summary  Ethan Faulkner [id number] DOB: 06/09/1915 DOA: 03/17/2019  PCP: Kathyrn Drown, MD  Admit date: 03/17/2019 Discharge date: 03/21/2019  Admitted From: Home Disposition:  Home with home health PT OT   Recommendations for Outpatient Follow-up:  1. Follow up with PCP in 1 week 2. Follow up with Neurology in 6 weeks. Referral placed at time of discharge.  3. Follow up with Dr. Jeffie Pollock, Urology as scheduled   Home Health: PT OT   Equipment/Devices: Hospital bed    Discharge Condition: Stable CODE STATUS: DNR   Diet recommendation: Heart healthy   Brief/Interim Summary: Ethan Faulkner is a 85 year old male with a history of hypothyroidism, hypertension, hyperlipidemia, paroxysmal atrial fibrillation on Eliquis, previous stroke with dysarthria, prostate cancer presenting with left upper extremity weakness, leg weakness, and gait instability. The patient has some difficulty providing history secondary to his previous stroke. History was obtained from speaking with the patient's daughters at the bedside. When the patient woke up in the a.m. on 03/17/2019, the patient had much difficulty getting out of bed due to increasing weakness. This was unusual as he normally does not require maximal assistance. In addition, he required maximal assistance getting to the bathroom using his walker. Once again this is not his usual baseline. As the day progressed, his caretakers noticed that he may have been neglecting his left side and not using his left arm/hand as much. Notably, the patient has an indwelling Foley catheter. It was last changed approximately 2-1/2 weeks prior to this admission.  UA was significant for large leukocytes, positive nitrites, > 50 WBC.  He was started on empiric Rocephin.  [facility] stroke work-up. MRI revealed acute right CVA. Stroke team consulted. Urine culture resulted with pseudomonas, sensitive  to cipro.   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 Prior infarct at the junction of the right internal capsule and anterior, superior right thalamus. Periventricular small vessel disease evident. No acute infarct is demonstrable. No mass or hemorrhage evident. -MRI brain: Several small acute cortical/subcortical infarcts within the right frontal lobe motor strip and right postcentral gyrus. Redemonstrated chronic infarcts within the left frontoparietal and right parietal lobes as well as right internal capsule. -Carotid ultrasound: Moderate calcific atherosclerosis without hemodynamically significant stenosis. Degree of narrowing estimated less than 50% bilaterally by ultrasound criteria. -MRA with no large vessel occlusion or high-grade arterial stenosis  -Echocardiogram: EF 55% -LDL 50, hemoglobin A1c 5.6 -Continue Lipitor, Eliquis -PT OT SLP, home health PT on dc  -Neurology consulted, follow up in office in 6 weeks    Paroxysmal atrial fibrillation -Continue Eliquis, Toprol  Essential hypertension -Continue Toprol, norvasc   Acute pseudomonas UTI, related to chronic indwelling Foley catheter which was present on admission -UA with positive nitrite, large leukocytes, > 50 WBC.  Urine culture with pseudomonas, susceptibility pending  -Blood culture negative to date -Empiric rocephin --> cefepime --> cipro on discharge for total 14 day course -Encouraged daughter to touch base with Dr. Jeffie Pollock once cipro course completed for directions on resuming home  ampicillin     Hypothyroidism -Continue Synthroid    Discharge Instructions  Discharge Instructions    Ambulatory referral to Neurology   Complete by: As directed    An appointment is requested in approximately: 6 weeks   Call MD for:  difficulty breathing, headache or visual disturbances   Complete by: As directed    Call MD for:  extreme fatigue   Complete by: As directed    Call MD for:  hives   Complete by: As directed    Call MD for:  persistant dizziness or light-headedness   Complete by: As directed    Call MD for:  persistant nausea and vomiting   Complete by: As directed    Call MD for:  severe uncontrolled pain   Complete by: As directed    Call MD for:  temperature >100.4   Complete by: As directed    Diet - low sodium heart healthy   Complete by: As directed    Discharge instructions   Complete by: As directed    You were cared for by a hospitalist during your hospital stay. If you have any questions about your discharge medications or the care you received while you were in the hospital after you are discharged, you can call the unit and ask to speak with the hospitalist on call if the hospitalist that took care of you is not available. Once you are discharged, your primary care physician will handle any further medical issues. Please note that NO REFILLS for any discharge medications will be authorized once you are discharged, as it is imperative that you return to your primary care physician (or establish a relationship with a primary care physician if you do not have one) for your aftercare needs so that they can reassess your need for medications and monitor your lab values.   Increase activity slowly   Complete by: As directed      Allergies as of 03/21/2019      Reactions   Xanax [alprazolam] Other (See Comments)   Dizzy, Sedated   Tape Rash   PATIENT'S SKIN WILL TEAR/PLEASE EITHER USE PAPER TAPE OR COBAN WRAP!!      Medication List    STOP taking these  medications   ampicillin 500 MG capsule Commonly known as: PRINCIPEN   azithromycin 250 MG tablet Commonly known as: Zithromax Z-Pak   ketoconazole 2 % cream Commonly known as: NIZORAL     TAKE these medications   amLODipine 5 MG tablet Commonly known as: NORVASC TAKE (1) TABLET BY MOUTH ONCE DAILY.   atorvastatin 20 MG tablet Commonly known as: LIPITOR TAKE 1 TABLET BY MOUTH ONCE DAILY AT 6:00 PM.   cetirizine 10 MG tablet Commonly known as: ZYRTEC Take 10 mg by mouth every evening.   ciprofloxacin 250 MG tablet Commonly known as: CIPRO Take 1 tablet (250 mg total) by mouth 2 (two) times daily for 13 days.   docusate sodium 100 MG capsule Commonly known as: COLACE Take 100 mg by mouth daily.   Eliquis 2.5 MG Tabs tablet Generic drug: apixaban TAKE (1) TABLET BY MOUTH TWICE DAILY 9AM AND 9PM.   guaiFENesin-dextromethorphan 100-10 MG/5ML syrup Commonly known as: ROBITUSSIN DM Take 5 mLs by mouth every 4 (four) hours as needed for cough.   levothyroxine 25 MCG tablet Commonly known as: SYNTHROID TAKE 1 TABLET EVERY MORNING ON AN EMPTY STOMACH FOR THYROID.   metoprolol succinate 25 MG 24 hr tablet Commonly known as: TOPROL-XL TAKE (1) TABLET  BY MOUTH ONCE DAILY.   QC Natura-LAX 17 GM/SCOOP powder Generic drug: polyethylene glycol powder MIX 1 CAPFUL IN 8 OZ OF WATER AND DRINK TWICE WEEKLY.   traMADol 50 MG tablet Commonly known as: ULTRAM TAKE ONE TABLET 3 TIMES DAILY AS NEEDED FOR SEVERE PAIN.            Durable Medical Equipment  (From admission, onward)         Start     Ordered   03/20/19 1032  For home use only DME Hospital bed  Once    Question Answer Comment  Length of Need Lifetime   Patient has (list medical condition): Stroke   The above medical condition requires: Patient requires the ability to reposition frequently   Head must be elevated greater than: 30 degrees   Bed type Semi-electric      03/20/19 1031         Follow-up  Information    Luking, Elayne Snare, MD. Schedule an appointment as soon as possible for a visit in 1 week(s).   Specialty: Family Medicine Contact information: [address] [place] [address] [phone]        [facility]. Schedule an appointment as soon as possible for a visit in 6 week(s).   Why: Referral was placed at time of discharge. They will call you with an appointment. If you do not hear from them, please call to make an appointment.  Contact information: [address]     [address] [place] [phone]       Irine Seal, MD Follow up.   Specialty: Urology Why: Call his office once course of cipro is complete to receive instructions for resuming ampicillin for UTI. Follow up with Dr. Jeffie Pollock as scheduled.  Contact information: [address] [place] [address] [phone]          Allergies  Allergen Reactions  . Xanax [Alprazolam] Other (See Comments)    Dizzy, Sedated  . Tape Rash    PATIENT'S SKIN WILL TEAR/PLEASE EITHER USE PAPER TAPE OR COBAN WRAP!!    Consultations:  Neurology    Procedures/Studies: Ct Head Wo Contrast  Result Date: 03/17/2019 CLINICAL DATA:  Altered mental status EXAM: CT HEAD WITHOUT CONTRAST TECHNIQUE: Contiguous axial images were obtained from the base of the skull through the vertex without intravenous contrast. COMPARISON:  None February 15, 2017 FINDINGS: Brain: There is moderate diffuse atrophy, stable. There is no appreciable mass, hemorrhage, extra-axial fluid collection, or midline shift. There is evidence of prior infarct at the left frontal-parietal junction which shows evolution and mild extension compared to the previous study. There is evidence of a prior infarct in the right internal capsule/anterior superior thalamus junction. There is patchy small vessel disease in the centra semiovale bilaterally. No acute appearing infarct is demonstrable on this  study. Vascular: No hyperdense vessel evident. There is extensive vascular calcification in the distal vertebral arteries, basilar artery, carotid siphon regions, and proximal right middle cerebral arteries. Skull: The bony calvarium appears intact. Sinuses/Orbits: There is mild mucosal thickening in several ethmoid air cells. Other visualized paranasal sinuses are clear. Visualized orbits appear symmetric bilaterally. Other: Visualized mastoid air cells are clear. IMPRESSION: Stable atrophy. Prior infarct at the left frontal-parietal junction superiorly with evolution and slight extension of infarction in this area compared to the prior study from 2018. Prior infarct at the junction of the right internal capsule and anterior, superior right thalamus. Periventricular small vessel disease  evident. No acute infarct is demonstrable. No mass or hemorrhage evident. Extensive arterial vascular calcification noted. Mild mucosal thickening in several ethmoid air cells. Electronically Signed   By: Lowella Grip III M.D.   On: 03/17/2019 16:34   Mr Angio Head Wo Contrast  Result Date: 03/20/2019 CLINICAL DATA:  Stroke, follow-up EXAM: MRA HEAD WITHOUT CONTRAST TECHNIQUE: Angiographic images of the Circle of Willis were obtained using MRA technique without intravenous contrast. COMPARISON:  Brain MRI 03/18/2019, carotid artery duplex 03/18/2019, CT angiogram head/neck [id number] FINDINGS: Dolichoectasia of the proximal anterior and posterior circulation large vessels. The intracranial internal carotid arteries are patent without significant stenosis. The M1 middle cerebral arteries are patent bilaterally without significant stenosis. The bilateral proximal M2 middle cerebral artery branches are somewhat poorly assessed on the current study due to artifact. No definite high-grade proximal stenosis identified within these vessels. The anterior cerebral arteries are patent bilaterally without significant proximal  stenosis. The intracranial vertebral arteries are patent bilaterally without significant stenosis, as is the basilar artery. The posterior cerebral arteries are patent bilaterally. Moderate segmental stenoses within the bilateral P2 posterior cerebral arteries were better delineated on prior CTA 02/14/2017 but appear unchanged. No intracranial aneurysm is identified. IMPRESSION: 1. No anterior circulation large vessel occlusion or proximal high-grade arterial stenosis identified. Please note the proximal M2 middle cerebral artery branches are somewhat poorly assessed due to artifact. 2. Redemonstrated moderate segmental stenoses within the bilateral P2 posterior cerebral arteries. No other significant proximal stenosis within the posterior circulation. 3. Dolichoectasia of the proximal large vessels. Electronically Signed   By: Kellie Simmering DO   On: 03/20/2019 10:30   Mr Brain Wo Contrast  Result Date: 03/18/2019 CLINICAL DATA:  Altered level of consciousness (LOC), unexplained altered mental status, rule out CVA. EXAM: MRI HEAD WITHOUT CONTRAST TECHNIQUE: Multiplanar, multiecho pulse sequences of the brain and surrounding structures were obtained without intravenous contrast. COMPARISON:  Head CT 08/12/2018, brain MRI 02/16/2017, brain MRI 05/22/2016 FINDINGS: Brain: Multiple sequences are motion degraded, limiting evaluation. This includes moderate/severe motion degradation of the axial FLAIR sequence. There are multiple small acute cortical/subcortical infarcts along the right central sulcus, involving the right frontal lobe motor strip and right postcentral gyrus. No evidence of intracranial mass. No midline shift or extra-axial fluid collection. Redemonstrated chronic infarcts within the left frontoparietal lobes and right parietal lobe. Redemonstrated chronic lacunar infarct within the right internal capsule. There is a background of moderate/advanced chronic small vessel ischemic disease. As before,  there are multiple supratentorial and infratentorial chronic microhemorrhages which are most numerous within the cerebellum. Generalized parenchymal atrophy. Vascular: Flow voids maintained within the proximal large arterial vessels. Dolichoectasia of the intracranial arteries. Skull and upper cervical spine: No focal marrow lesion. C3-C4 grade 1 anterolisthesis. Sinuses/Orbits: Visualized orbits demonstrate no acute abnormality. Minimal ethmoid sinus mucosal thickening. Trace fluid within bilateral mastoid air cells. These results were called by telephone at the time of interpretation on 03/18/2019 at 8:16 pm to provider Dr. Kennon Holter, Who verbally acknowledged these results. IMPRESSION: 1. Motion degraded examination. 2. Several small acute cortical/subcortical infarcts within the right frontal lobe motor strip and right postcentral gyrus. 3. Redemonstrated chronic infarcts within the left frontoparietal and right parietal lobes as well as right internal capsule. 4. Generalized parenchymal atrophy with chronic small vessel ischemic disease. 5. Multifocal chronic microhemorrhage most prominent within the cerebellum. Dolichoectasia of the intracranial arteries. Findings are suggestive of chronic hypertension. Electronically Signed   By: Kellie Simmering DO   On: 03/18/2019 20:16  US Carotid Bilateral (at [facility] Only)  Result Date: 03/18/2019 CLINICAL DATA:  TIA symptoms EXAM: BILATERAL CAROTID DUPLEX ULTRASOUND TECHNIQUE: Pearline Cables scale imaging, color Doppler and duplex ultrasound were performed of bilateral carotid and vertebral arteries in the neck. COMPARISON:  None. FINDINGS: Criteria: Quantification of carotid stenosis is based on velocity parameters that correlate the residual internal carotid diameter with NASCET-based stenosis levels, using the diameter of the distal internal carotid lumen as the denominator for stenosis measurement. The following velocity measurements were obtained: RIGHT ICA: 63/8 cm/sec  CCA: 0000000 cm/sec SYSTOLIC ICA/CCA RATIO:  0000000 ECA: 88 cm/sec LEFT ICA: 79/11 cm/sec CCA: [id number] cm/sec SYSTOLIC ICA/CCA RATIO:  1.0 ECA: 57 cm/sec RIGHT CAROTID ARTERY: Moderate calcified plaque formation. No hemodynamically significant right ICA stenosis, velocity elevation, or turbulent flow. Degree of narrowing less than 50%. RIGHT VERTEBRAL ARTERY:  Not visualized, possibly occluded LEFT CAROTID ARTERY: Similar moderate calcified plaque formation. No hemodynamically significant left ICA stenosis, velocity elevation, or turbulent flow. LEFT VERTEBRAL ARTERY:  Antegrade IMPRESSION: Moderate calcific atherosclerosis without hemodynamically significant stenosis. Degree of narrowing estimated less than 50% bilaterally by ultrasound criteria. Nonvisualized right vertebral artery suggesting occlusion Patent antegrade left vertebral artery Electronically Signed   By: Jerilynn Mages.  Shick M.D.   On: 03/18/2019 11:15   Dg Chest Portable 1 View  Result Date: 03/17/2019 CLINICAL DATA:  EMS called out to pt's house because pt was "stumbling around, not himself." Family called pcp and they were concerned about possible TIA. History of stroke. EMS says pt talking much but will follow commands. Family says pt talks some days but some days he doesn't. Reports pt has trouble communicating since last stroke. Pt has DNR but family couldn't find paperwork. CBG 122. bp 146/69, hr 70 and 94% on room air. HISTORY OF CANCER, STROKE, CEREBRAL INFARCTION, HTN EXAM: PORTABLE CHEST 1 VIEW COMPARISON:  05/06/2018 FINDINGS: Cardiac silhouette mildly enlarged.  No mediastinal or hilar masses. Lungs are clear.  No convincing pleural effusion.  No pneumothorax. Skeletal structures are demineralized but grossly intact. IMPRESSION: 1. No acute cardiopulmonary disease. 2. Stable cardiomegaly. Electronically Signed   By: Lajean Manes M.D.   On: 03/17/2019 16:01    Echo IMPRESSIONS    1. Left ventricular ejection fraction, by visual estimation, is  55%. The left ventricle has normal function. Normal left ventricular size. There is mildly increased left ventricular hypertrophy.  2. Left ventricular diastolic Doppler parameters are indeterminate pattern of LV diastolic filling.  3. Global right ventricle has low normal systolic function.The right ventricular size is normal. No increase in right ventricular wall thickness.  4. Left atrial size was moderately dilated.  5. Right atrial size was severely dilated.  6. Mild mitral annular calcification.  7. Moderate aortic valve annular calcification.  8. The mitral valve is degenerative. Mild mitral valve regurgitation.  9. The tricuspid valve is grossly normal. Tricuspid valve regurgitation mild-moderate. 10. The aortic valve is tricuspid Aortic valve regurgitation is mild to moderate by color flow Doppler. Mild aortic valve stenosis. 11. The pulmonic valve was grossly normal. Pulmonic valve regurgitation is not visualized by color flow Doppler. 12. Aortic dilatation noted. 13. There is mild dilatation of the aortic root measuring 37 mm. 14. Moderately elevated pulmonary artery systolic pressure. 15. The inferior vena cava is normal in size with greater than 50% respiratory variability, suggesting right atrial pressure of 3 mmHg.     Carotid US  IMPRESSION: Moderate calcific atherosclerosis without hemodynamically significant stenosis. Degree of narrowing estimated less  than 50% bilaterally by ultrasound criteria.  Nonvisualized right vertebral artery suggesting occlusion  Patent antegrade left vertebral artery   Discharge Exam: Vitals:   03/21/19 0405 03/21/19 0831  BP: 136/72 137/71  Pulse: 79 85  Resp: 17 18  Temp: 97.7 F (36.5 C) 98.5 F (36.9 C)  SpO2: 94% 94%     General: Pt is alert, awake, not in acute distress Cardiovascular: RRR, S1/S2 +, no edema Respiratory: CTA bilaterally, no wheezing, no rhonchi, no respiratory distress, no conversational dyspnea   Abdominal: Soft, NT, ND, bowel sounds + Extremities: no edema, no cyanosis Psych: Stable     The results of significant diagnostics from this hospitalization (including imaging, microbiology, ancillary and laboratory) are listed below for reference.     Microbiology: Recent Results (from the past 240 hour(s))  Urine Culture     Status: Abnormal   Collection Time: 03/17/19  5:38 PM   Specimen: Urine, Clean Catch  Result Value Ref Range Status   Specimen Description   Final    URINE, CLEAN CATCH Performed at [facility], [address], [place], [place] [address]    Special Requests   Final    NONE Performed at [facility], [address], [place], [place] [address]    Culture >=100,000 COLONIES/mL PSEUDOMONAS AERUGINOSA (A)  Final   Report Status 03/20/2019 FINAL  Final   Organism ID, Bacteria PSEUDOMONAS AERUGINOSA (A)  Final      Susceptibility   Pseudomonas aeruginosa - MIC*    CEFTAZIDIME 4 SENSITIVE Sensitive     CIPROFLOXACIN <=0.25 SENSITIVE Sensitive     GENTAMICIN <=1 SENSITIVE Sensitive     IMIPENEM 2 SENSITIVE Sensitive     PIP/TAZO 32 SENSITIVE Sensitive     CEFEPIME 2 SENSITIVE Sensitive     * >=100,000 COLONIES/mL PSEUDOMONAS AERUGINOSA  SARS CORONAVIRUS 2 (TAT 6-24 HRS) Nasopharyngeal Urine, Catheterized     Status: None   Collection Time: 03/17/19  8:00 PM   Specimen: Urine, Catheterized; Nasopharyngeal  Result Value Ref Range Status   SARS Coronavirus 2 NEGATIVE NEGATIVE Final    Comment: (NOTE) SARS-CoV-2 target nucleic acids are NOT DETECTED. The SARS-CoV-2 RNA is generally detectable in upper and lower respiratory specimens during the acute phase of infection. Negative results do not preclude SARS-CoV-2 infection, do not rule out co-infections with other pathogens, and should not be used as the sole basis for treatment or other patient management decisions. Negative results must be combined with clinical observations, patient history, and  epidemiological information. The expected result is Negative. Fact Sheet for Patients: SugarRoll.be Fact Sheet for Healthcare Providers: https://www.woods-mathews.com/ This test is not yet approved or cleared by the [place] FDA and  has been authorized for detection and/or diagnosis of SARS-CoV-2 by FDA under an Emergency Use Authorization (EUA). This EUA will remain  in effect (meaning this test can be used) for the duration of the COVID-19 declaration under Section 56 4(b)(1) of the Act, 21 U.S.C. section 360bbb-3(b)(1), unless the authorization is terminated or revoked sooner. Performed at [facility], [place] [address] [place], [place] [address]   Culture, blood (Routine X 2) w Reflex to ID Panel     Status: None (Preliminary result)   Collection Time: 03/17/19  8:14 PM   Specimen: Right Antecubital; Blood  Result Value Ref Range Status   Specimen Description RIGHT ANTECUBITAL  Final   Special Requests   Final    BOTTLES DRAWN AEROBIC AND ANAEROBIC Blood Culture adequate volume  Culture   Final    NO GROWTH 4 DAYS Performed at [facility], [address], [place], [place] [address]    Report Status PENDING  Incomplete  Culture, blood (Routine X 2) w Reflex to ID Panel     Status: None (Preliminary result)   Collection Time: 03/17/19  8:15 PM   Specimen: Left Antecubital; Blood  Result Value Ref Range Status   Specimen Description LEFT ANTECUBITAL  Final   Special Requests   Final    BOTTLES DRAWN AEROBIC AND ANAEROBIC Blood Culture adequate volume   Culture   Final    NO GROWTH 4 DAYS Performed at [facility], [address] [place], [place] [address]    Report Status PENDING  Incomplete  MRSA PCR Screening     Status: None   Collection Time: 03/20/19  1:39 PM   Specimen: Nasopharyngeal  Result Value Ref Range Status   MRSA by PCR NEGATIVE NEGATIVE Final    Comment:        The GeneXpert MRSA Assay (FDA approved  for NASAL specimens only), is one component of a comprehensive MRSA colonization surveillance program. It is not intended to diagnose MRSA infection nor to guide or monitor treatment for MRSA infections. Performed at [facility], [place] [address] [place], [place] [address]      Labs: BNP (last 3 results) Recent Labs    05/06/18 1407  BNP 123456*   Basic Metabolic Panel: Recent Labs  Lab 03/17/19 1511 03/18/19 0538  NA 137 139  K 4.4 3.9  CL 107 107  CO2 22 22  GLUCOSE 106* 94  BUN 28* 25*  CREATININE 0.98 0.74  CALCIUM 9.0 9.0   Liver Function Tests: Recent Labs  Lab 03/17/19 1511 03/18/19 0538  AST 19 16  ALT 16 14  ALKPHOS 57 54  BILITOT 1.4* 1.5*  PROT 7.1 6.5  ALBUMIN 4.1 3.8   No results for input(s): LIPASE, AMYLASE in the last 168 hours. No results for input(s): AMMONIA in the last 168 hours. CBC: Recent Labs  Lab 03/17/19 1511 03/18/19 0538  WBC 4.6 4.9  NEUTROABS 2.6  --   HGB 13.1 12.9*  HCT 41.6 40.4  MCV 97.9 97.3  PLT 164 144*   Cardiac Enzymes: No results for input(s): CKTOTAL, CKMB, CKMBINDEX, TROPONINI in the last 168 hours. BNP: Invalid input(s): POCBNP CBG: No results for input(s): GLUCAP in the last 168 hours. D-Dimer No results for input(s): DDIMER in the last 72 hours. Hgb A1c No results for input(s): HGBA1C in the last 72 hours. Lipid Profile No results for input(s): CHOL, HDL, LDLCALC, TRIG, CHOLHDL, LDLDIRECT in the last 72 hours. Thyroid function studies No results for input(s): TSH, T4TOTAL, T3FREE, THYROIDAB in the last 72 hours.  Invalid input(s): FREET3 Anemia work up No results for input(s): VITAMINB12, FOLATE, FERRITIN, TIBC, IRON, RETICCTPCT in the last 72 hours. Urinalysis    Component Value Date/Time   COLORURINE YELLOW 03/17/2019 1738   APPEARANCEUR HAZY (A) 03/17/2019 1738   LABSPEC 1.017 03/17/2019 1738   PHURINE 5.0 03/17/2019 1738   GLUCOSEU NEGATIVE 03/17/2019 1738   HGBUR MODERATE (A)  03/17/2019 1738   BILIRUBINUR NEGATIVE 03/17/2019 1738   BILIRUBINUR ++ 10/26/2016 1121   KETONESUR NEGATIVE 03/17/2019 1738   PROTEINUR NEGATIVE 03/17/2019 1738   UROBILINOGEN 0.2 05/11/2016 1117   NITRITE POSITIVE (A) 03/17/2019 1738   LEUKOCYTESUR LARGE (A) 03/17/2019 1738   Sepsis Labs Invalid input(s): PROCALCITONIN,  WBC,  LACTICIDVEN Microbiology Recent Results (  from the past 240 hour(s))  Urine Culture     Status: Abnormal   Collection Time: 03/17/19  5:38 PM   Specimen: Urine, Clean Catch  Result Value Ref Range Status   Specimen Description   Final    URINE, CLEAN CATCH Performed at [facility], [address] [place], [place] [address]    Special Requests   Final    NONE Performed at [facility], [address], [place], [place] [address]    Culture >=100,000 COLONIES/mL PSEUDOMONAS AERUGINOSA (A)  Final   Report Status 03/20/2019 FINAL  Final   Organism ID, Bacteria PSEUDOMONAS AERUGINOSA (A)  Final      Susceptibility   Pseudomonas aeruginosa - MIC*    CEFTAZIDIME 4 SENSITIVE Sensitive     CIPROFLOXACIN <=0.25 SENSITIVE Sensitive     GENTAMICIN <=1 SENSITIVE Sensitive     IMIPENEM 2 SENSITIVE Sensitive     PIP/TAZO 32 SENSITIVE Sensitive     CEFEPIME 2 SENSITIVE Sensitive     * >=100,000 COLONIES/mL PSEUDOMONAS AERUGINOSA  SARS CORONAVIRUS 2 (TAT 6-24 HRS) Nasopharyngeal Urine, Catheterized     Status: None   Collection Time: 03/17/19  8:00 PM   Specimen: Urine, Catheterized; Nasopharyngeal  Result Value Ref Range Status   SARS Coronavirus 2 NEGATIVE NEGATIVE Final    Comment: (NOTE) SARS-CoV-2 target nucleic acids are NOT DETECTED. The SARS-CoV-2 RNA is generally detectable in upper and lower respiratory specimens during the acute phase of infection. Negative results do not preclude SARS-CoV-2 infection, do not rule out co-infections with other pathogens, and should not be used as the sole basis for treatment or other patient management  decisions. Negative results must be combined with clinical observations, patient history, and epidemiological information. The expected result is Negative. Fact Sheet for Patients: SugarRoll.be Fact Sheet for Healthcare Providers: https://www.woods-mathews.com/ This test is not yet approved or cleared by the [place] FDA and  has been authorized for detection and/or diagnosis of SARS-CoV-2 by FDA under an Emergency Use Authorization (EUA). This EUA will remain  in effect (meaning this test can be used) for the duration of the COVID-19 declaration under Section 56 4(b)(1) of the Act, 21 U.S.C. section 360bbb-3(b)(1), unless the authorization is terminated or revoked sooner. Performed at [facility], [place] [address] [place], [place] [address]   Culture, blood (Routine X 2) w Reflex to ID Panel     Status: None (Preliminary result)   Collection Time: 03/17/19  8:14 PM   Specimen: Right Antecubital; Blood  Result Value Ref Range Status   Specimen Description RIGHT ANTECUBITAL  Final   Special Requests   Final    BOTTLES DRAWN AEROBIC AND ANAEROBIC Blood Culture adequate volume   Culture   Final    NO GROWTH 4 DAYS Performed at [facility], [address], [place], [place] [address]    Report Status PENDING  Incomplete  Culture, blood (Routine X 2) w Reflex to ID Panel     Status: None (Preliminary result)   Collection Time: 03/17/19  8:15 PM   Specimen: Left Antecubital; Blood  Result Value Ref Range Status   Specimen Description LEFT ANTECUBITAL  Final   Special Requests   Final    BOTTLES DRAWN AEROBIC AND ANAEROBIC Blood Culture adequate volume   Culture   Final    NO GROWTH 4 DAYS Performed at [facility], [address], [place], [place] [address]    Report Status PENDING  Incomplete  MRSA PCR Screening  Status: None   Collection Time: 03/20/19  1:39 PM   Specimen: Nasopharyngeal  Result Value Ref Range Status    MRSA by PCR NEGATIVE NEGATIVE Final    Comment:        The GeneXpert MRSA Assay (FDA approved for NASAL specimens only), is one component of a comprehensive MRSA colonization surveillance program. It is not intended to diagnose MRSA infection nor to guide or monitor treatment for MRSA infections. Performed at [facility], [place] [address], [place], [place] [address]      Patient was seen and examined on the day of discharge and was found to be in stable condition. Time coordinating discharge: 35 minutes including assessment and coordination of care, as well as examination of the patient.   SIGNED:  Dessa Phi, DO [facility] 03/21/2019, 9:28 AM

## 2019-03-21 NOTE — Progress Notes (Signed)
Pharmacy Antibiotic Note  Ethan Faulkner is a 85 y.o. male admitted on 03/17/2019 with UTI.  Pharmacy has been consulted for Cipro dosing.  CrCL - 46 ml/min  Plan: Ciprofloxacin 250 mg po twice per day x 13 more days Monitor renal function and clinical status   Height: 5\' 7"  (170.2 cm) Weight: 160 lb 0.9 oz (72.6 kg) IBW/kg (Calculated) : 66.1  Temp (24hrs), Avg:97.8 F (36.6 C), Min:97.5 F (36.4 C), Max:98.5 F (36.9 C)  Recent Labs  Lab 03/17/19 1511 03/18/19 0538  WBC 4.6 4.9  CREATININE 0.98 0.74    Estimated Creatinine Clearance: 45.9 mL/min (by C-G formula based on SCr of 0.74 mg/dL).    Allergies  Allergen Reactions  . Xanax [Alprazolam] Other (See Comments)    Dizzy, Sedated  . Tape Rash    PATIENT'S SKIN WILL TEAR/PLEASE EITHER USE PAPER TAPE OR COBAN WRAP!!    Antimicrobials this admission: Cefepime 10/26 >> 10/27 Cipro 10/27 >>   Thank you for allowing pharmacy to be a part of this patient's care.  Alanda Slim, PharmD, [facility] Clinical Pharmacist Please see AMION for all Pharmacists' Contact Phone Numbers 03/21/2019, 8:06 AM

## 2019-03-21 NOTE — Progress Notes (Signed)
Physical Therapy Treatment Patient Details Name: Ethan Faulkner MRN: [id number] DOB: 1915-07-14 Today's Date: 03/21/2019    History of Present Illness Hershell Bowmer  is a 85 y.o. male, w hypothyroidism, hypertension, hyperlipidemia, Pafib, h/o stroke w baseline slurred speech, presents with weakness of the left arm. Imaging revealed several small acute cortical/subcortical infarcts within the right front lobe and right postcentral gyrus.    PT Comments    Pt with improved ambulation endurance this date as well as ability to stay in walker with max verbal and tactile cues. Pt con't to have decreased step height more shuffled gait pattern with onset of fatigue. Pt's daughter present and reporting he is getting closer to baseline. Acute PT to cont to follow.    Follow Up Recommendations  Home health PT;Supervision/Assistance - 24 hour;Supervision for mobility/OOB     Equipment Recommendations  Hospital bed    Recommendations for Other Services       Precautions / Restrictions Precautions Precautions: Fall Precaution Comments: HOH, difficulty with word finding Restrictions Weight Bearing Restrictions: No    Mobility  Bed Mobility               General bed mobility comments: pt received sitting in chair  Transfers Overall transfer level: Needs assistance Equipment used: Rolling walker (2 wheeled) Transfers: Sit to/from Stand Sit to Stand: Min assist         General transfer comment: minA to power, max directional tactile and verbal cues, suspect mostly due to [facility], does better with demonstration, has tendency to lean posteriorly during powering up  Ambulation/Gait Ambulation/Gait assistance: Min assist;Mod assist Gait Distance (Feet): 100 Feet(x1, 80x1) Assistive device: Rolling walker (2 wheeled) Gait Pattern/deviations: Decreased step length - right;Decreased step length - left;Decreased stance time - left;Decreased dorsiflexion - left;Scissoring Gait  velocity: decreased Gait velocity interpretation: <1.31 ft/sec, indicative of household ambulator General Gait Details: pt veering to the R, max verbal and tactile cues to stay in walker, better during second trial after PT demonstration, pt with increased trunk flexion and decreased step height with onset of fatigue   Stairs             Wheelchair Mobility    Modified Rankin (Stroke Patients Only) Modified Rankin (Stroke Patients Only) Pre-Morbid Rankin Score: Moderate disability Modified Rankin: Moderately severe disability     Balance Overall balance assessment: Needs assistance Sitting-balance support: No upper extremity supported;Feet supported Sitting balance-Leahy Scale: Fair Sitting balance - Comments: sitting EOB   Standing balance support: During functional activity;No upper extremity supported Standing balance-Leahy Scale: Fair Standing balance comment: stood at sink for hygiene s/p BM, held on with bilat UE                            Cognition Arousal/Alertness: Awake/alert Behavior During Therapy: Flat affect Overall Cognitive Status: History of cognitive impairments - at baseline                                 General Comments: daughter present, reports this to be baseline      Exercises      General Comments General comments (skin integrity, edema, etc.): VSS      Pertinent Vitals/Pain Pain Assessment: No/denies pain    Home Living                      Prior Function  PT Goals (current goals can now be found in the care plan section) Progress towards PT goals: Progressing toward goals    Frequency    Min 4X/week      PT Plan Frequency needs to be updated    Co-evaluation              AM-PAC PT "6 Clicks" Mobility   Outcome Measure  Help needed turning from your back to your side while in a flat bed without using bedrails?: A Little Help needed moving from lying on your back  to sitting on the side of a flat bed without using bedrails?: A Little Help needed moving to and from a bed to a chair (including a wheelchair)?: A Little Help needed standing up from a chair using your arms (e.g., wheelchair or bedside chair)?: A Little Help needed to walk in hospital room?: A Little Help needed climbing 3-5 steps with a railing? : A Lot 6 Click Score: 17    End of Session Equipment Utilized During Treatment: Gait belt Activity Tolerance: Patient tolerated treatment well;Patient limited by fatigue Patient left: with call bell/phone within reach;with family/visitor present;in bed Nurse Communication: Mobility status PT Visit Diagnosis: Unsteadiness on feet (R26.81);Other abnormalities of gait and mobility (R26.89);Muscle weakness (generalized) (M62.81)     Time: [id number] PT Time Calculation (min) (ACUTE ONLY): 27 min  Charges:  $Gait Training: 8-22 mins $Therapeutic Activity: 8-22 mins                     Kittie Plater, PT, DPT Acute Rehabilitation Services Pager #: [phone] Office #: [phone]    Berline Lopes 03/21/2019, 9:01 AM

## 2019-03-22 ENCOUNTER — Telehealth: Payer: Self-pay | Admitting: Family Medicine

## 2019-03-22 DIAGNOSIS — Z7901 Long term (current) use of anticoagulants: Secondary | ICD-10-CM | POA: Diagnosis not present

## 2019-03-22 DIAGNOSIS — G43909 Migraine, unspecified, not intractable, without status migrainosus: Secondary | ICD-10-CM | POA: Diagnosis not present

## 2019-03-22 DIAGNOSIS — I10 Essential (primary) hypertension: Secondary | ICD-10-CM | POA: Diagnosis not present

## 2019-03-22 DIAGNOSIS — I088 Other rheumatic multiple valve diseases: Secondary | ICD-10-CM | POA: Diagnosis not present

## 2019-03-22 DIAGNOSIS — G8194 Hemiplegia, unspecified affecting left nondominant side: Secondary | ICD-10-CM | POA: Diagnosis not present

## 2019-03-22 DIAGNOSIS — E785 Hyperlipidemia, unspecified: Secondary | ICD-10-CM | POA: Diagnosis not present

## 2019-03-22 DIAGNOSIS — C61 Malignant neoplasm of prostate: Secondary | ICD-10-CM | POA: Diagnosis not present

## 2019-03-22 DIAGNOSIS — T83511A Infection and inflammatory reaction due to indwelling urethral catheter, initial encounter: Secondary | ICD-10-CM | POA: Diagnosis not present

## 2019-03-22 DIAGNOSIS — Z960 Presence of urogenital implants: Secondary | ICD-10-CM | POA: Diagnosis not present

## 2019-03-22 DIAGNOSIS — I69318 Other symptoms and signs involving cognitive functions following cerebral infarction: Secondary | ICD-10-CM | POA: Diagnosis not present

## 2019-03-22 DIAGNOSIS — E039 Hypothyroidism, unspecified: Secondary | ICD-10-CM | POA: Diagnosis not present

## 2019-03-22 DIAGNOSIS — R1310 Dysphagia, unspecified: Secondary | ICD-10-CM | POA: Diagnosis not present

## 2019-03-22 DIAGNOSIS — Z79891 Long term (current) use of opiate analgesic: Secondary | ICD-10-CM | POA: Diagnosis not present

## 2019-03-22 DIAGNOSIS — I69322 Dysarthria following cerebral infarction: Secondary | ICD-10-CM | POA: Diagnosis not present

## 2019-03-22 DIAGNOSIS — Z923 Personal history of irradiation: Secondary | ICD-10-CM | POA: Diagnosis not present

## 2019-03-22 DIAGNOSIS — I480 Paroxysmal atrial fibrillation: Secondary | ICD-10-CM | POA: Diagnosis not present

## 2019-03-22 DIAGNOSIS — Z87891 Personal history of nicotine dependence: Secondary | ICD-10-CM | POA: Diagnosis not present

## 2019-03-22 DIAGNOSIS — N2889 Other specified disorders of kidney and ureter: Secondary | ICD-10-CM | POA: Diagnosis not present

## 2019-03-22 DIAGNOSIS — B965 Pseudomonas (aeruginosa) (mallei) (pseudomallei) as the cause of diseases classified elsewhere: Secondary | ICD-10-CM | POA: Diagnosis not present

## 2019-03-22 DIAGNOSIS — D696 Thrombocytopenia, unspecified: Secondary | ICD-10-CM | POA: Diagnosis not present

## 2019-03-22 DIAGNOSIS — F0150 Vascular dementia without behavioral disturbance: Secondary | ICD-10-CM | POA: Diagnosis not present

## 2019-03-22 DIAGNOSIS — M5136 Other intervertebral disc degeneration, lumbar region: Secondary | ICD-10-CM | POA: Diagnosis not present

## 2019-03-22 LAB — CULTURE, BLOOD (ROUTINE X 2)
Culture: NO GROWTH
Culture: NO GROWTH
Special Requests: ADEQUATE
Special Requests: ADEQUATE

## 2019-03-22 NOTE — Telephone Encounter (Signed)
Would be fine to go ahead with this May have verbal order Encourage oral intake especially liquids to help combat dark urine If possible to do metabolic 7 CBC next week Diagnosis weakness, concentrated urine Nursing should coordinate with family regarding this

## 2019-03-22 NOTE — Telephone Encounter (Signed)
Verbal orders given to Amy at [facility].

## 2019-03-22 NOTE — Telephone Encounter (Signed)
Amy, PT with [facility] called to request verbal orders for PT & nursing  PT for twice a week for 4 weeks & once a week for 4 weeks  Nursing for eval - states pt's urine is dark  Please advise & call Amy [phone] - ok to leave message

## 2019-03-24 DIAGNOSIS — I69322 Dysarthria following cerebral infarction: Secondary | ICD-10-CM | POA: Diagnosis not present

## 2019-03-24 DIAGNOSIS — I69318 Other symptoms and signs involving cognitive functions following cerebral infarction: Secondary | ICD-10-CM | POA: Diagnosis not present

## 2019-03-24 DIAGNOSIS — T83511A Infection and inflammatory reaction due to indwelling urethral catheter, initial encounter: Secondary | ICD-10-CM | POA: Diagnosis not present

## 2019-03-24 DIAGNOSIS — F0150 Vascular dementia without behavioral disturbance: Secondary | ICD-10-CM | POA: Diagnosis not present

## 2019-03-24 DIAGNOSIS — G8194 Hemiplegia, unspecified affecting left nondominant side: Secondary | ICD-10-CM | POA: Diagnosis not present

## 2019-03-24 DIAGNOSIS — B965 Pseudomonas (aeruginosa) (mallei) (pseudomallei) as the cause of diseases classified elsewhere: Secondary | ICD-10-CM | POA: Diagnosis not present

## 2019-03-25 DIAGNOSIS — F0150 Vascular dementia without behavioral disturbance: Secondary | ICD-10-CM | POA: Diagnosis not present

## 2019-03-25 DIAGNOSIS — I69318 Other symptoms and signs involving cognitive functions following cerebral infarction: Secondary | ICD-10-CM | POA: Diagnosis not present

## 2019-03-25 DIAGNOSIS — T83511A Infection and inflammatory reaction due to indwelling urethral catheter, initial encounter: Secondary | ICD-10-CM | POA: Diagnosis not present

## 2019-03-25 DIAGNOSIS — I69322 Dysarthria following cerebral infarction: Secondary | ICD-10-CM | POA: Diagnosis not present

## 2019-03-25 DIAGNOSIS — G8194 Hemiplegia, unspecified affecting left nondominant side: Secondary | ICD-10-CM | POA: Diagnosis not present

## 2019-03-25 DIAGNOSIS — B965 Pseudomonas (aeruginosa) (mallei) (pseudomallei) as the cause of diseases classified elsewhere: Secondary | ICD-10-CM | POA: Diagnosis not present

## 2019-03-27 ENCOUNTER — Telehealth: Payer: Self-pay | Admitting: Family Medicine

## 2019-03-27 DIAGNOSIS — T83511A Infection and inflammatory reaction due to indwelling urethral catheter, initial encounter: Secondary | ICD-10-CM | POA: Diagnosis not present

## 2019-03-27 DIAGNOSIS — I69322 Dysarthria following cerebral infarction: Secondary | ICD-10-CM | POA: Diagnosis not present

## 2019-03-27 DIAGNOSIS — I69318 Other symptoms and signs involving cognitive functions following cerebral infarction: Secondary | ICD-10-CM | POA: Diagnosis not present

## 2019-03-27 DIAGNOSIS — B965 Pseudomonas (aeruginosa) (mallei) (pseudomallei) as the cause of diseases classified elsewhere: Secondary | ICD-10-CM | POA: Diagnosis not present

## 2019-03-27 DIAGNOSIS — F0150 Vascular dementia without behavioral disturbance: Secondary | ICD-10-CM | POA: Diagnosis not present

## 2019-03-27 DIAGNOSIS — G8194 Hemiplegia, unspecified affecting left nondominant side: Secondary | ICD-10-CM | POA: Diagnosis not present

## 2019-03-27 NOTE — Telephone Encounter (Signed)
Tried to contact daughter [name] [phone]) voicemail is full, unable to leave message

## 2019-03-27 NOTE — Telephone Encounter (Signed)
[name] returned call. [name] will call back in the morning (03/28/2019) with what her father prefers. Daughter is going to be at fathers house tonight and tomorrow. Daughter states that diet improved; hospital bed in home, seems to be better.

## 2019-03-27 NOTE — Telephone Encounter (Signed)
Nurses  Patient recently in the hospital for stroke now is at home Please connect with his daughter-[name] Pegram Please find out from her how she would like to do a hospital follow-up visit this can be virtual (Certainly I can also do a in person visit-car visit or home visit -but they probably do not want their dad around people to minimize his risk of Covid)

## 2019-03-27 NOTE — Telephone Encounter (Signed)
Attempted to contact daughter on both home and mobile; both voicemail are full

## 2019-03-29 ENCOUNTER — Telehealth: Payer: Self-pay | Admitting: Family Medicine

## 2019-03-29 DIAGNOSIS — B965 Pseudomonas (aeruginosa) (mallei) (pseudomallei) as the cause of diseases classified elsewhere: Secondary | ICD-10-CM | POA: Diagnosis not present

## 2019-03-29 DIAGNOSIS — I69318 Other symptoms and signs involving cognitive functions following cerebral infarction: Secondary | ICD-10-CM | POA: Diagnosis not present

## 2019-03-29 DIAGNOSIS — G8194 Hemiplegia, unspecified affecting left nondominant side: Secondary | ICD-10-CM | POA: Diagnosis not present

## 2019-03-29 DIAGNOSIS — T83511A Infection and inflammatory reaction due to indwelling urethral catheter, initial encounter: Secondary | ICD-10-CM | POA: Diagnosis not present

## 2019-03-29 DIAGNOSIS — F0150 Vascular dementia without behavioral disturbance: Secondary | ICD-10-CM | POA: Diagnosis not present

## 2019-03-29 DIAGNOSIS — I69322 Dysarthria following cerebral infarction: Secondary | ICD-10-CM | POA: Diagnosis not present

## 2019-03-29 NOTE — Telephone Encounter (Signed)
Pt has appt scheduled for 04/04/2019 at 4:10 pm

## 2019-03-29 NOTE — Telephone Encounter (Signed)
Daughter [name]) is requesting home visit next week instead of phone visit tomorrow. She thinks you need to see patient face to face anytime next week after 4 would be good for them. Please Advise on what date to put in for visit.

## 2019-03-29 NOTE — Telephone Encounter (Signed)
Next Tuesday or Wednesday or Thursday at 410

## 2019-03-29 NOTE — Telephone Encounter (Signed)
Please advise. Thank you

## 2019-04-03 DIAGNOSIS — T83511A Infection and inflammatory reaction due to indwelling urethral catheter, initial encounter: Secondary | ICD-10-CM | POA: Diagnosis not present

## 2019-04-03 DIAGNOSIS — B965 Pseudomonas (aeruginosa) (mallei) (pseudomallei) as the cause of diseases classified elsewhere: Secondary | ICD-10-CM | POA: Diagnosis not present

## 2019-04-03 DIAGNOSIS — I69322 Dysarthria following cerebral infarction: Secondary | ICD-10-CM | POA: Diagnosis not present

## 2019-04-03 DIAGNOSIS — F0150 Vascular dementia without behavioral disturbance: Secondary | ICD-10-CM | POA: Diagnosis not present

## 2019-04-03 DIAGNOSIS — G8194 Hemiplegia, unspecified affecting left nondominant side: Secondary | ICD-10-CM | POA: Diagnosis not present

## 2019-04-03 DIAGNOSIS — I69318 Other symptoms and signs involving cognitive functions following cerebral infarction: Secondary | ICD-10-CM | POA: Diagnosis not present

## 2019-04-04 ENCOUNTER — Other Ambulatory Visit: Payer: Self-pay

## 2019-04-04 ENCOUNTER — Ambulatory Visit (INDEPENDENT_AMBULATORY_CARE_PROVIDER_SITE_OTHER): Payer: Medicare Other | Admitting: Family Medicine

## 2019-04-04 DIAGNOSIS — I480 Paroxysmal atrial fibrillation: Secondary | ICD-10-CM | POA: Diagnosis not present

## 2019-04-04 DIAGNOSIS — Z23 Encounter for immunization: Secondary | ICD-10-CM | POA: Diagnosis not present

## 2019-04-04 DIAGNOSIS — N390 Urinary tract infection, site not specified: Secondary | ICD-10-CM

## 2019-04-04 DIAGNOSIS — D696 Thrombocytopenia, unspecified: Secondary | ICD-10-CM

## 2019-04-04 NOTE — Progress Notes (Signed)
   Subjective:    Patient ID: Ethan Faulkner, male    DOB: Nov 09, 1915, 85 y.o.   MRN: [id number]  HPI Pt was in hospital for acute cystitis on 10/23-10/27.  Patient recently released from the [facility] transition Not having any urinary symptoms since coming home No fever chills or sweats no hematuria No bleeding issues Is doing some physical therapy this seems to be helping They are doing the best they can at protecting him against Covid He does take his medicines as directed He eats overall good  Pt daughter Ethan Faulkner states that pt is doing well. Daughter does have concerns regarding pt previous UTI treatment. Daughter is not confident in [place] the way he went about diagnosing or treating pt. Pt is not currently having any symptoms.    Review of Systems  Constitutional: Negative for activity change, fatigue and fever.  HENT: Negative for congestion and rhinorrhea.   Respiratory: Negative for cough and shortness of breath.   Cardiovascular: Negative for chest pain and leg swelling.  Gastrointestinal: Negative for abdominal pain, diarrhea and nausea.  Genitourinary: Negative for dysuria and hematuria.  Neurological: Negative for weakness and headaches.  Psychiatric/Behavioral: Negative for agitation and behavioral problems.       Objective:   Physical Exam Vitals signs reviewed.  Constitutional:      General: He is not in acute distress. HENT:     Head: Normocephalic and atraumatic.  Eyes:     General:        Right eye: No discharge.        Left eye: No discharge.  Neck:     Trachea: No tracheal deviation.  Cardiovascular:     Rate and Rhythm: Normal rate.     Heart sounds: Normal heart sounds. No murmur.  Pulmonary:     Effort: Pulmonary effort is normal. No respiratory distress.     Breath sounds: Normal breath sounds.  Lymphadenopathy:     Cervical: No cervical adenopathy.  Skin:    General: Skin is warm and dry.  Neurological:     Mental Status:  He is alert.     Coordination: Coordination normal.  Psychiatric:        Behavior: Behavior normal.    No swelling in the legs. No tachycardia Patient has normal baseline strength Patient has aphasia from previous stroke Recent lab work shows thrombocytopenia but not in a dangerous range Flu shot given today without complications    Assessment & Plan:  Continue all current measures including blood thinner We did discuss when to treat for UTI would not to treat They will notify us if any problems Flu shot given today Medications reviewed No need for any lab work currently Follow-up 3 months

## 2019-04-05 ENCOUNTER — Other Ambulatory Visit: Payer: Self-pay | Admitting: Family Medicine

## 2019-04-05 DIAGNOSIS — G8194 Hemiplegia, unspecified affecting left nondominant side: Secondary | ICD-10-CM | POA: Diagnosis not present

## 2019-04-05 DIAGNOSIS — F0150 Vascular dementia without behavioral disturbance: Secondary | ICD-10-CM | POA: Diagnosis not present

## 2019-04-05 DIAGNOSIS — B965 Pseudomonas (aeruginosa) (mallei) (pseudomallei) as the cause of diseases classified elsewhere: Secondary | ICD-10-CM | POA: Diagnosis not present

## 2019-04-05 DIAGNOSIS — I69318 Other symptoms and signs involving cognitive functions following cerebral infarction: Secondary | ICD-10-CM | POA: Diagnosis not present

## 2019-04-05 DIAGNOSIS — T83511A Infection and inflammatory reaction due to indwelling urethral catheter, initial encounter: Secondary | ICD-10-CM | POA: Diagnosis not present

## 2019-04-05 DIAGNOSIS — I69322 Dysarthria following cerebral infarction: Secondary | ICD-10-CM | POA: Diagnosis not present

## 2019-04-05 NOTE — Addendum Note (Signed)
Addended by: Vicente Males on: 04/05/2019 03:14 PM   Modules accepted: Orders

## 2019-04-05 NOTE — Progress Notes (Signed)
Left arm please

## 2019-04-05 NOTE — Progress Notes (Signed)
What arm was the flu shot given in? Just need to document correctly. Please advise. Thank you.

## 2019-04-10 DIAGNOSIS — T83511A Infection and inflammatory reaction due to indwelling urethral catheter, initial encounter: Secondary | ICD-10-CM | POA: Diagnosis not present

## 2019-04-10 DIAGNOSIS — B965 Pseudomonas (aeruginosa) (mallei) (pseudomallei) as the cause of diseases classified elsewhere: Secondary | ICD-10-CM | POA: Diagnosis not present

## 2019-04-10 DIAGNOSIS — I69322 Dysarthria following cerebral infarction: Secondary | ICD-10-CM | POA: Diagnosis not present

## 2019-04-10 DIAGNOSIS — I69318 Other symptoms and signs involving cognitive functions following cerebral infarction: Secondary | ICD-10-CM | POA: Diagnosis not present

## 2019-04-10 DIAGNOSIS — F0150 Vascular dementia without behavioral disturbance: Secondary | ICD-10-CM | POA: Diagnosis not present

## 2019-04-10 DIAGNOSIS — G8194 Hemiplegia, unspecified affecting left nondominant side: Secondary | ICD-10-CM | POA: Diagnosis not present

## 2019-04-13 DIAGNOSIS — F0150 Vascular dementia without behavioral disturbance: Secondary | ICD-10-CM | POA: Diagnosis not present

## 2019-04-13 DIAGNOSIS — B965 Pseudomonas (aeruginosa) (mallei) (pseudomallei) as the cause of diseases classified elsewhere: Secondary | ICD-10-CM | POA: Diagnosis not present

## 2019-04-13 DIAGNOSIS — G8194 Hemiplegia, unspecified affecting left nondominant side: Secondary | ICD-10-CM | POA: Diagnosis not present

## 2019-04-13 DIAGNOSIS — I69318 Other symptoms and signs involving cognitive functions following cerebral infarction: Secondary | ICD-10-CM | POA: Diagnosis not present

## 2019-04-13 DIAGNOSIS — T83511A Infection and inflammatory reaction due to indwelling urethral catheter, initial encounter: Secondary | ICD-10-CM | POA: Diagnosis not present

## 2019-04-13 DIAGNOSIS — I69322 Dysarthria following cerebral infarction: Secondary | ICD-10-CM | POA: Diagnosis not present

## 2019-04-18 ENCOUNTER — Ambulatory Visit (INDEPENDENT_AMBULATORY_CARE_PROVIDER_SITE_OTHER): Payer: Medicare Other | Admitting: Urology

## 2019-04-18 DIAGNOSIS — R338 Other retention of urine: Secondary | ICD-10-CM | POA: Diagnosis not present

## 2019-05-02 ENCOUNTER — Other Ambulatory Visit: Payer: Self-pay | Admitting: Family Medicine

## 2019-05-03 DIAGNOSIS — G8194 Hemiplegia, unspecified affecting left nondominant side: Secondary | ICD-10-CM | POA: Diagnosis not present

## 2019-05-03 DIAGNOSIS — I69322 Dysarthria following cerebral infarction: Secondary | ICD-10-CM | POA: Diagnosis not present

## 2019-05-03 DIAGNOSIS — F0150 Vascular dementia without behavioral disturbance: Secondary | ICD-10-CM | POA: Diagnosis not present

## 2019-05-09 ENCOUNTER — Ambulatory Visit (INDEPENDENT_AMBULATORY_CARE_PROVIDER_SITE_OTHER): Payer: Medicare Other

## 2019-05-09 DIAGNOSIS — R339 Retention of urine, unspecified: Secondary | ICD-10-CM | POA: Diagnosis not present

## 2019-05-09 NOTE — Progress Notes (Signed)
Cath Change/ Replacement  Patient is present today for a catheter change due to urinary retention.  90ml of water was removed from the balloon, a 22FR foley cath was removed with out difficulty.  Patient was cleaned and prepped in a sterile fashion with betadine and 2% lidocaine jelly was instilled into the urethra. A 22 FR foley cath was replaced into the bladder no complications were noted Urine return was noted 89ml and urine was yellow in color. The balloon was filled with 73ml of sterile water. A bedside bag was attached for drainage.  A night bag was also given to the patient and patient was given instruction on how to change from one bag to another. Patient was given proper instruction on catheter care.    Performed by: Gibson Ramp RN   Follow up: in 1 month. Home health order for foley placement is pending with [facility].

## 2019-05-13 DIAGNOSIS — C61 Malignant neoplasm of prostate: Secondary | ICD-10-CM | POA: Diagnosis not present

## 2019-05-13 DIAGNOSIS — Z85528 Personal history of other malignant neoplasm of kidney: Secondary | ICD-10-CM | POA: Diagnosis not present

## 2019-05-13 DIAGNOSIS — Z7901 Long term (current) use of anticoagulants: Secondary | ICD-10-CM | POA: Diagnosis not present

## 2019-05-13 DIAGNOSIS — Z466 Encounter for fitting and adjustment of urinary device: Secondary | ICD-10-CM | POA: Diagnosis not present

## 2019-05-13 DIAGNOSIS — Z87440 Personal history of urinary (tract) infections: Secondary | ICD-10-CM | POA: Diagnosis not present

## 2019-05-13 DIAGNOSIS — E785 Hyperlipidemia, unspecified: Secondary | ICD-10-CM | POA: Diagnosis not present

## 2019-05-13 DIAGNOSIS — I10 Essential (primary) hypertension: Secondary | ICD-10-CM | POA: Diagnosis not present

## 2019-05-13 DIAGNOSIS — D696 Thrombocytopenia, unspecified: Secondary | ICD-10-CM | POA: Diagnosis not present

## 2019-05-13 DIAGNOSIS — I480 Paroxysmal atrial fibrillation: Secondary | ICD-10-CM | POA: Diagnosis not present

## 2019-05-13 DIAGNOSIS — R339 Retention of urine, unspecified: Secondary | ICD-10-CM | POA: Diagnosis not present

## 2019-05-13 DIAGNOSIS — E039 Hypothyroidism, unspecified: Secondary | ICD-10-CM | POA: Diagnosis not present

## 2019-05-13 DIAGNOSIS — Z9181 History of falling: Secondary | ICD-10-CM | POA: Diagnosis not present

## 2019-05-13 DIAGNOSIS — I69320 Aphasia following cerebral infarction: Secondary | ICD-10-CM | POA: Diagnosis not present

## 2019-05-16 ENCOUNTER — Encounter: Payer: Self-pay | Admitting: Urology

## 2019-05-23 ENCOUNTER — Telehealth: Payer: Self-pay | Admitting: Urology

## 2019-05-23 NOTE — Telephone Encounter (Signed)
Pt contact Cindy called, she stated someone called her to let them know the pt was due to be seen by Dr. Jeffie Pollock in January. However, she said they just set up the home health care and wanted to know if it was neccessary for him to be seen and if so, could the doctor do a virtual visit.

## 2019-05-23 NOTE — Telephone Encounter (Signed)
Yes, we can do a virtual visit for him., He will need at least that for his medicare and Dr. Jeffie Pollock over seeing the [place] order. They have to at least be "seen" once a year.

## 2019-05-23 NOTE — Telephone Encounter (Signed)
I see he has a nurse visit scheduled, is that supposed to be an OV instead?

## 2019-05-29 NOTE — Telephone Encounter (Signed)
Home health is doing all cath changes now. The nurse visit can be cancelled. The ov can just be changed to a virtual visit

## 2019-05-30 ENCOUNTER — Telehealth: Payer: Self-pay | Admitting: Family Medicine

## 2019-05-30 NOTE — Telephone Encounter (Signed)
Patient is having back drainage at night. He is currently taking mucinex. Please advise [place]

## 2019-05-30 NOTE — Telephone Encounter (Signed)
Pt is having off and on drainage at night. Pt has been taking Mucinex and that has been helping. Pt has no fever. Pt has always had a "cruddy" cough. Pt does have productive cough. Clear to brown sputum. Pt daughter [name] is wanting to know if there is anything that can be uses on a daily basis with his normal routine. Please advise. Thank you  C[place]on.

## 2019-05-30 NOTE — Telephone Encounter (Signed)
I did have a discussion with Ethan Faulkner currently she will use Mucinex when she is finished with that She will use Robitussin as needed she will watch for fever if fever or increased difficulty breathing or other problems she will notify us I am not opposed to an antibiotic but we will hold off on that currently She was instructed to connect with [place] within the next 48 hours if symptoms are worsening

## 2019-05-31 DIAGNOSIS — I69320 Aphasia following cerebral infarction: Secondary | ICD-10-CM | POA: Diagnosis not present

## 2019-05-31 DIAGNOSIS — R339 Retention of urine, unspecified: Secondary | ICD-10-CM | POA: Diagnosis not present

## 2019-05-31 DIAGNOSIS — Z466 Encounter for fitting and adjustment of urinary device: Secondary | ICD-10-CM | POA: Diagnosis not present

## 2019-05-31 DIAGNOSIS — I480 Paroxysmal atrial fibrillation: Secondary | ICD-10-CM | POA: Diagnosis not present

## 2019-05-31 DIAGNOSIS — D696 Thrombocytopenia, unspecified: Secondary | ICD-10-CM | POA: Diagnosis not present

## 2019-05-31 DIAGNOSIS — C61 Malignant neoplasm of prostate: Secondary | ICD-10-CM | POA: Diagnosis not present

## 2019-06-01 ENCOUNTER — Other Ambulatory Visit: Payer: Self-pay | Admitting: Family Medicine

## 2019-06-06 DIAGNOSIS — C61 Malignant neoplasm of prostate: Secondary | ICD-10-CM | POA: Diagnosis not present

## 2019-06-06 DIAGNOSIS — D696 Thrombocytopenia, unspecified: Secondary | ICD-10-CM | POA: Diagnosis not present

## 2019-06-06 DIAGNOSIS — I69320 Aphasia following cerebral infarction: Secondary | ICD-10-CM | POA: Diagnosis not present

## 2019-06-06 DIAGNOSIS — I480 Paroxysmal atrial fibrillation: Secondary | ICD-10-CM | POA: Diagnosis not present

## 2019-06-06 DIAGNOSIS — R339 Retention of urine, unspecified: Secondary | ICD-10-CM | POA: Diagnosis not present

## 2019-06-06 DIAGNOSIS — Z466 Encounter for fitting and adjustment of urinary device: Secondary | ICD-10-CM | POA: Diagnosis not present

## 2019-06-07 DIAGNOSIS — Z466 Encounter for fitting and adjustment of urinary device: Secondary | ICD-10-CM | POA: Diagnosis not present

## 2019-06-07 DIAGNOSIS — N390 Urinary tract infection, site not specified: Secondary | ICD-10-CM | POA: Diagnosis not present

## 2019-06-07 DIAGNOSIS — I480 Paroxysmal atrial fibrillation: Secondary | ICD-10-CM | POA: Diagnosis not present

## 2019-06-07 DIAGNOSIS — D696 Thrombocytopenia, unspecified: Secondary | ICD-10-CM | POA: Diagnosis not present

## 2019-06-07 DIAGNOSIS — I69320 Aphasia following cerebral infarction: Secondary | ICD-10-CM | POA: Diagnosis not present

## 2019-06-07 DIAGNOSIS — C61 Malignant neoplasm of prostate: Secondary | ICD-10-CM | POA: Diagnosis not present

## 2019-06-07 DIAGNOSIS — R339 Retention of urine, unspecified: Secondary | ICD-10-CM | POA: Diagnosis not present

## 2019-06-12 DIAGNOSIS — D696 Thrombocytopenia, unspecified: Secondary | ICD-10-CM | POA: Diagnosis not present

## 2019-06-12 DIAGNOSIS — I69320 Aphasia following cerebral infarction: Secondary | ICD-10-CM | POA: Diagnosis not present

## 2019-06-12 DIAGNOSIS — E039 Hypothyroidism, unspecified: Secondary | ICD-10-CM | POA: Diagnosis not present

## 2019-06-12 DIAGNOSIS — I10 Essential (primary) hypertension: Secondary | ICD-10-CM | POA: Diagnosis not present

## 2019-06-12 DIAGNOSIS — Z7901 Long term (current) use of anticoagulants: Secondary | ICD-10-CM | POA: Diagnosis not present

## 2019-06-12 DIAGNOSIS — Z87440 Personal history of urinary (tract) infections: Secondary | ICD-10-CM | POA: Diagnosis not present

## 2019-06-12 DIAGNOSIS — E785 Hyperlipidemia, unspecified: Secondary | ICD-10-CM | POA: Diagnosis not present

## 2019-06-12 DIAGNOSIS — C61 Malignant neoplasm of prostate: Secondary | ICD-10-CM | POA: Diagnosis not present

## 2019-06-12 DIAGNOSIS — Z466 Encounter for fitting and adjustment of urinary device: Secondary | ICD-10-CM | POA: Diagnosis not present

## 2019-06-12 DIAGNOSIS — I480 Paroxysmal atrial fibrillation: Secondary | ICD-10-CM | POA: Diagnosis not present

## 2019-06-12 DIAGNOSIS — Z85528 Personal history of other malignant neoplasm of kidney: Secondary | ICD-10-CM | POA: Diagnosis not present

## 2019-06-12 DIAGNOSIS — Z9181 History of falling: Secondary | ICD-10-CM | POA: Diagnosis not present

## 2019-06-12 DIAGNOSIS — R339 Retention of urine, unspecified: Secondary | ICD-10-CM | POA: Diagnosis not present

## 2019-06-13 DIAGNOSIS — I69320 Aphasia following cerebral infarction: Secondary | ICD-10-CM | POA: Diagnosis not present

## 2019-06-13 DIAGNOSIS — D696 Thrombocytopenia, unspecified: Secondary | ICD-10-CM | POA: Diagnosis not present

## 2019-06-13 DIAGNOSIS — C61 Malignant neoplasm of prostate: Secondary | ICD-10-CM | POA: Diagnosis not present

## 2019-06-13 DIAGNOSIS — Z466 Encounter for fitting and adjustment of urinary device: Secondary | ICD-10-CM | POA: Diagnosis not present

## 2019-06-13 DIAGNOSIS — I480 Paroxysmal atrial fibrillation: Secondary | ICD-10-CM | POA: Diagnosis not present

## 2019-06-13 DIAGNOSIS — R339 Retention of urine, unspecified: Secondary | ICD-10-CM | POA: Diagnosis not present

## 2019-06-14 ENCOUNTER — Telehealth: Payer: Self-pay | Admitting: Family Medicine

## 2019-06-14 NOTE — Telephone Encounter (Signed)
Discussed with daughter(DPR) Daughter states they use the cream in his private area when he gets irritated and they have quite a bit at this time. She will check with the pharmacy and see what the cash price is and if it is expensive they will check with insurance to see if there is a cheaper alternative and if not expensive they will pay out of pocket

## 2019-06-14 NOTE — Telephone Encounter (Signed)
Nurses [name] Pegram-daughter-sent a MyChart message on her chart therefore I converted to a phone call for the actual patient  On her MyChart message she is concerned that nystatin is no longer covered by his insurance Nystatin is only use when he has trush I imagine he only takes it occasionally? Please confirm what her specific question is Nystatin is dirt cheap I am not sure that there is a covered alternative to this Certainly I am fine with using generic when it is necessary Please look into the issue more

## 2019-06-20 ENCOUNTER — Ambulatory Visit: Payer: Medicare Other

## 2019-06-23 ENCOUNTER — Telehealth: Payer: Self-pay | Admitting: Urology

## 2019-06-23 ENCOUNTER — Telehealth: Payer: Self-pay | Admitting: Family Medicine

## 2019-06-23 ENCOUNTER — Other Ambulatory Visit: Payer: Self-pay

## 2019-06-23 ENCOUNTER — Encounter: Payer: Self-pay | Admitting: Urology

## 2019-06-23 ENCOUNTER — Telehealth (INDEPENDENT_AMBULATORY_CARE_PROVIDER_SITE_OTHER): Payer: Medicare Other | Admitting: Urology

## 2019-06-23 DIAGNOSIS — Z960 Presence of urogenital implants: Secondary | ICD-10-CM

## 2019-06-23 DIAGNOSIS — R339 Retention of urine, unspecified: Secondary | ICD-10-CM

## 2019-06-23 MED ORDER — LIDOCAINE HCL 2 % EX GEL: 1.0000 "application " | CUTANEOUS | 11 refills | Status: DC | PRN

## 2019-06-23 NOTE — Telephone Encounter (Signed)
Requesting a refill on lidocaine jelly for catheter change   [phone]

## 2019-06-23 NOTE — Telephone Encounter (Signed)
Spoke with home health nurse- going tomorrow to change catheter.

## 2019-06-23 NOTE — Telephone Encounter (Signed)
Daughter dropped off form for [employer] form placed in nurse box at nurse station.

## 2019-06-23 NOTE — Telephone Encounter (Signed)
Ethan Faulkner , a nurse with [facility] called and also asked for this refill on lidocaine gel for cath change that will take place tomorrow(Saturday)

## 2019-06-23 NOTE — Progress Notes (Signed)
@  [email or web address]@  Subjective: [name] is a 86 yo male with chronic retention managed with monthly foley changes.  He has done well on current therapy.  He can have some hematuria after the foley exchange but it clears quickly. He will get occasonal pain that requires tramadol. ROS:  Review of Systems  Genitourinary: Negative for hematuria and urgency.       He has occasional pain with the foley and some pericatheter discharge.   All other systems reviewed and are negative.   Anti-infectives: Anti-infectives (From admission, onward)   None      Current Outpatient Medications  Medication Sig Dispense Refill  . amLODipine (NORVASC) 5 MG tablet TAKE (1) TABLET BY MOUTH ONCE DAILY. 30 tablet 0  . atorvastatin (LIPITOR) 20 MG tablet TAKE 1 TABLET BY MOUTH ONCE DAILY AT 6:00 PM. 30 tablet 4  . cetirizine (ZYRTEC) 10 MG tablet Take 10 mg by mouth every evening.     . docusate sodium (COLACE) 100 MG capsule Take 100 mg by mouth daily.    Marland Kitchen ELIQUIS 2.5 MG TABS tablet TAKE (1) TABLET BY MOUTH TWICE DAILY 9AM AND 9PM. 60 tablet 4  . guaiFENesin-dextromethorphan (ROBITUSSIN DM) 100-10 MG/5ML syrup Take 5 mLs by mouth every 4 (four) hours as needed for cough. 118 mL 0  . levothyroxine (SYNTHROID) 25 MCG tablet TAKE 1 TABLET EVERY MORNING ON AN EMPTY STOMACH FOR THYROID. 30 tablet 4  . metoprolol succinate (TOPROL-XL) 25 MG 24 hr tablet TAKE (1) TABLET BY MOUTH ONCE DAILY. 30 tablet 5  . QC NATURA-LAX 17 GM/SCOOP powder MIX 1 CAPFUL IN 8 OZ OF WATER AND DRINK TWICE WEEKLY. 510 g 3  . traMADol (ULTRAM) 50 MG tablet TAKE ONE TABLET 3 TIMES DAILY AS NEEDED FOR SEVERE PAIN. 21 tablet 2   No current facility-administered medications for this visit.     Objective:   Intake/Output from previous day:    Physical Exam  Lab Results:  No results for input(s): WBC, HGB, HCT, PLT in the last 72 hours. BMET No results for input(s): NA, K, CL, CO2, GLUCOSE, BUN, CREATININE, CALCIUM in the last  72 hours. PT/INR No results for input(s): LABPROT, INR in the last 72 hours. ABG No results for input(s): PHART, HCO3 in the last 72 hours.  Invalid input(s): PCO2, PO2  Studies/Results: No results found.   Assessment and Plan: Chronic retention doing well with monthly cath changes.  Continue current therapy.  He will need f/u in 84yr which can be done virtually if need be.    CC: Dr. Sallee Lange.     Irine Seal 06/23/2019 [phone]

## 2019-06-23 NOTE — Telephone Encounter (Signed)
Patient's daughter called regarding needing a 22G catheter and about specimen results  Stated catheter change needed done today.   Requesting call back [phone]

## 2019-06-23 NOTE — Telephone Encounter (Signed)
Form in provider office. Please advise. Thank you. 

## 2019-06-23 NOTE — Telephone Encounter (Signed)
Spoke with Home Health nurse- scheduled to change patient catheter tomorrow- Lidocaine jelly sent to pharmacy for catheter insertion.

## 2019-06-25 NOTE — Telephone Encounter (Signed)
Nurses This form will require a up-to-date visit This visit can be done virtual-video or in person whichever they prefer  Nurses also I will have you review this form with the daughter-[name] Several of the highlighted areas could be completed well before my virtual visit in order to expedite this form  I have put highlight on those parts Please keep the form available for this discussion that nurses will have with [name] And also keep this form available for the virtual visit  Also if there is any additional information that is needed to know in regards to finishing this form please collect that information at the time of the previsit with [name] (Please note I would recommend doing the previsit with [name] at a time separate from the virtual visit so that it does not make the process of getting Bill ready for his virtual visit a prolonged process that would throw the schedule off-in other words please do this ahead of time on a separate day from the visit) Thank you-Dr. Nicki Reaper

## 2019-06-26 NOTE — Telephone Encounter (Signed)
After 6 PM tomorrow-on Tuesday-please

## 2019-06-26 NOTE — Telephone Encounter (Signed)
Highlighted questions done besides blood pressure. Daughter Ethan Faulkner states she will do before visit tomorrow. She was notified visit would be tomorrow after 6 and pt was added to schedule for virtual visit. Form in dr scott's folder

## 2019-06-26 NOTE — Telephone Encounter (Signed)
Went over form with pt's daughter and she wants to do virtual visit. She asked if the visit could be after 6pm today or tomorrow so she could be with him.

## 2019-06-27 ENCOUNTER — Other Ambulatory Visit: Payer: Self-pay

## 2019-06-27 ENCOUNTER — Ambulatory Visit (INDEPENDENT_AMBULATORY_CARE_PROVIDER_SITE_OTHER): Payer: Medicare Other | Admitting: Family Medicine

## 2019-06-27 DIAGNOSIS — C61 Malignant neoplasm of prostate: Secondary | ICD-10-CM | POA: Diagnosis not present

## 2019-06-27 DIAGNOSIS — Z466 Encounter for fitting and adjustment of urinary device: Secondary | ICD-10-CM | POA: Diagnosis not present

## 2019-06-27 DIAGNOSIS — Z8673 Personal history of transient ischemic attack (TIA), and cerebral infarction without residual deficits: Secondary | ICD-10-CM

## 2019-06-27 DIAGNOSIS — I480 Paroxysmal atrial fibrillation: Secondary | ICD-10-CM

## 2019-06-27 DIAGNOSIS — I69320 Aphasia following cerebral infarction: Secondary | ICD-10-CM | POA: Diagnosis not present

## 2019-06-27 DIAGNOSIS — R339 Retention of urine, unspecified: Secondary | ICD-10-CM | POA: Diagnosis not present

## 2019-06-27 DIAGNOSIS — C642 Malignant neoplasm of left kidney, except renal pelvis: Secondary | ICD-10-CM | POA: Diagnosis not present

## 2019-06-27 DIAGNOSIS — I7090 Unspecified atherosclerosis: Secondary | ICD-10-CM | POA: Diagnosis not present

## 2019-06-27 DIAGNOSIS — F0150 Vascular dementia without behavioral disturbance: Secondary | ICD-10-CM | POA: Diagnosis not present

## 2019-06-27 DIAGNOSIS — G8194 Hemiplegia, unspecified affecting left nondominant side: Secondary | ICD-10-CM

## 2019-06-27 DIAGNOSIS — D696 Thrombocytopenia, unspecified: Secondary | ICD-10-CM | POA: Diagnosis not present

## 2019-06-28 NOTE — Progress Notes (Signed)
Subjective:    Patient ID: Ethan Faulkner, male    DOB: [birth year], 86 y.o.   MRN: [id number]  HPI This is a medical follow-up for this patient. He has paroxysmal atrial fibrillation He is on chronic anticoagulants He has a history of stroke He is debilitated unable to take care of himself and this is a permanent finding He also has an indwelling catheter Foley due to chronic prostate issues and this will be a chronic issue as well He has a history of respiratory illnesses  Over the past couple months he has been doing better with eating he does need assistance.  Family and attendants/nurses have to fix his food for him.  We also have to cut it up and pretty much feed him although he is able to do some feeding of himself it is not consistent  The patient also has significant disability from the stroke both cognitive disabilities as well as functional disabilities.  He is unable to dress himself.  He is unable to bathe himself.  He is able to indicate when he needs to go to the bathroom but he needs help with cleaning afterwards.  The patient is totally dependent upon others for getting into bed and getting out of bed.  He could walk with a walker and a walking belt around him that attendants hold onto as he walks.  Without their assistance he would not be able to maneuver from the bed to recliner. Should also be noted that this patient is not capable of taking his own medications may have to be given to him at specific times. 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 In addition to this he also has left t-sided weakness from CVA that he suffered several years ago In addition to this also has history of a kidney tumor which is inoperable currently. Patient has chronic indwelling Foley catheter that needs tending to on a regular basis In addition to this  patient also has blood pressure issues and hyperlipidemia   Review of Systems  Constitutional: Positive for fatigue. Negative for diaphoresis.  HENT: Negative for congestion and rhinorrhea.   Respiratory: Negative for cough and shortness of breath.   Cardiovascular: Negative for chest pain and leg swelling.  Gastrointestinal: Negative for abdominal pain and diarrhea.  Musculoskeletal: Positive for gait problem. Negative for joint swelling.  Skin: Negative for color change and rash.  Neurological: Negative for dizziness and headaches.       Ataxia difficulty walking weakness  Psychiatric/Behavioral: Positive for confusion and decreased concentration. Negative for behavioral problems.       Objective:   Physical Exam Patient had virtual visit Appears to be in no distress Atraumatic Neuro able to relate and oriented No apparent resp distress Colo patient r normal Patient unable to interact unable to respond appropriately because of his stroke and dementia       Assessment & Plan:  1. Paroxysmal atrial fibrillation (HCC) On Eliquis no bleeding issues currently  2. Multi-infarct dementia due to atherosclerosis [facility]) Patient is disabled from this needs help with medications bathing food etc.  Please see discussion above  3. History of completed stroke Patient has weakness associated with previous stroke.  This will never get better.  He is a fall risk.  Patient needs attendants to help him with daily living.  4. Left hemiparesis ([place]) See above.  5.  Malignant neoplasm of left kidney [facility]) This is an operable will monitor  35 minutes spent with family for this virtual visit additional time was spent reviewing the record as well as reviewing the record and documentation

## 2019-07-04 ENCOUNTER — Other Ambulatory Visit: Payer: Self-pay

## 2019-07-04 ENCOUNTER — Other Ambulatory Visit: Payer: Self-pay | Admitting: Family Medicine

## 2019-07-04 DIAGNOSIS — B3749 Other urogenital candidiasis: Secondary | ICD-10-CM

## 2019-07-04 MED ORDER — NYSTATIN 100000 UNIT/GM EX POWD: 1.0000 "application " | Freq: Two times a day (BID) | CUTANEOUS | 11 refills | Status: AC

## 2019-07-07 DIAGNOSIS — D696 Thrombocytopenia, unspecified: Secondary | ICD-10-CM | POA: Diagnosis not present

## 2019-07-07 DIAGNOSIS — I69320 Aphasia following cerebral infarction: Secondary | ICD-10-CM | POA: Diagnosis not present

## 2019-07-07 DIAGNOSIS — I480 Paroxysmal atrial fibrillation: Secondary | ICD-10-CM | POA: Diagnosis not present

## 2019-07-07 DIAGNOSIS — Z466 Encounter for fitting and adjustment of urinary device: Secondary | ICD-10-CM | POA: Diagnosis not present

## 2019-07-07 DIAGNOSIS — R339 Retention of urine, unspecified: Secondary | ICD-10-CM | POA: Diagnosis not present

## 2019-07-07 DIAGNOSIS — C61 Malignant neoplasm of prostate: Secondary | ICD-10-CM | POA: Diagnosis not present

## 2019-07-11 ENCOUNTER — Other Ambulatory Visit: Payer: Self-pay | Admitting: Family Medicine

## 2019-07-12 DIAGNOSIS — Z9181 History of falling: Secondary | ICD-10-CM | POA: Diagnosis not present

## 2019-07-12 DIAGNOSIS — Z85528 Personal history of other malignant neoplasm of kidney: Secondary | ICD-10-CM | POA: Diagnosis not present

## 2019-07-12 DIAGNOSIS — I10 Essential (primary) hypertension: Secondary | ICD-10-CM | POA: Diagnosis not present

## 2019-07-12 DIAGNOSIS — R339 Retention of urine, unspecified: Secondary | ICD-10-CM | POA: Diagnosis not present

## 2019-07-12 DIAGNOSIS — E785 Hyperlipidemia, unspecified: Secondary | ICD-10-CM | POA: Diagnosis not present

## 2019-07-12 DIAGNOSIS — Z7901 Long term (current) use of anticoagulants: Secondary | ICD-10-CM | POA: Diagnosis not present

## 2019-07-12 DIAGNOSIS — C61 Malignant neoplasm of prostate: Secondary | ICD-10-CM | POA: Diagnosis not present

## 2019-07-12 DIAGNOSIS — E039 Hypothyroidism, unspecified: Secondary | ICD-10-CM | POA: Diagnosis not present

## 2019-07-12 DIAGNOSIS — Z466 Encounter for fitting and adjustment of urinary device: Secondary | ICD-10-CM | POA: Diagnosis not present

## 2019-07-12 DIAGNOSIS — I480 Paroxysmal atrial fibrillation: Secondary | ICD-10-CM | POA: Diagnosis not present

## 2019-07-12 DIAGNOSIS — I69320 Aphasia following cerebral infarction: Secondary | ICD-10-CM | POA: Diagnosis not present

## 2019-07-12 DIAGNOSIS — Z87440 Personal history of urinary (tract) infections: Secondary | ICD-10-CM | POA: Diagnosis not present

## 2019-07-18 ENCOUNTER — Other Ambulatory Visit: Payer: Self-pay | Admitting: Family Medicine

## 2019-07-18 ENCOUNTER — Other Ambulatory Visit: Payer: Self-pay | Admitting: Urology

## 2019-07-21 ENCOUNTER — Telehealth: Payer: Self-pay

## 2019-07-21 NOTE — Telephone Encounter (Signed)
Lft message with home health nurse Leda Gauze, That urine culture needed no tx.

## 2019-07-28 DIAGNOSIS — I480 Paroxysmal atrial fibrillation: Secondary | ICD-10-CM | POA: Diagnosis not present

## 2019-07-28 DIAGNOSIS — C61 Malignant neoplasm of prostate: Secondary | ICD-10-CM | POA: Diagnosis not present

## 2019-07-28 DIAGNOSIS — I69320 Aphasia following cerebral infarction: Secondary | ICD-10-CM | POA: Diagnosis not present

## 2019-07-28 DIAGNOSIS — Z466 Encounter for fitting and adjustment of urinary device: Secondary | ICD-10-CM | POA: Diagnosis not present

## 2019-07-28 DIAGNOSIS — I10 Essential (primary) hypertension: Secondary | ICD-10-CM | POA: Diagnosis not present

## 2019-07-28 DIAGNOSIS — R339 Retention of urine, unspecified: Secondary | ICD-10-CM | POA: Diagnosis not present

## 2019-08-01 ENCOUNTER — Other Ambulatory Visit: Payer: Self-pay | Admitting: Family Medicine

## 2019-08-02 ENCOUNTER — Telehealth: Payer: Self-pay

## 2019-08-02 ENCOUNTER — Telehealth: Payer: Self-pay | Admitting: Urology

## 2019-08-02 ENCOUNTER — Telehealth: Payer: Self-pay | Admitting: Family Medicine

## 2019-08-02 MED ORDER — CEPHALEXIN 250 MG PO CAPS: 250.0000 mg | ORAL_CAPSULE | Freq: Four times a day (QID) | ORAL | 0 refills | Status: DC

## 2019-08-02 NOTE — Telephone Encounter (Signed)
Ethan Faulkner asks that a nurse return her call at [phone]

## 2019-08-02 NOTE — Telephone Encounter (Signed)
Daughter called office with concerns of pt catheter issues. Per daughter she states home health told her pt was not longer under I care. I corrected that and instructed daughter he is still our patient home health is only doing monthly cath changes per family request.   Pt had catheter changed on Friday 3/5 and then daughter noticed yellow discharge coming from penis on Sunday. Daughter concerned with infection. Pt pain has increased.

## 2019-08-02 NOTE — Telephone Encounter (Signed)
Pt's daughter [name] is calling to see if Dr. Nicki Reaper could send in an antibiotic for pt. He is having some pain with his catheter along with yellow drainage. No fever. It started about 2 days ago.   They have contacted Dr. Ralene Muskrat office and have been told he is no longer under his care for the catheter.   They have been giving him tramadol for pain and that is not helping.   [place].

## 2019-08-02 NOTE — Telephone Encounter (Signed)
Also pts daughter called and left message at [phone]

## 2019-08-02 NOTE — Telephone Encounter (Signed)
Keflex 250 mg 4 times daily for 5 days This could either be a capsule or liquid depending on family's preference Patient has had a stroke

## 2019-08-02 NOTE — Telephone Encounter (Signed)
Message sent to Dr. Wrenn °

## 2019-08-02 NOTE — Telephone Encounter (Signed)
Please advise. Thank you

## 2019-08-02 NOTE — Telephone Encounter (Signed)
If the drainage is along side the catheter, that is not a real concern.

## 2019-08-02 NOTE — Telephone Encounter (Signed)
Daughter (DPR) stated the patient could do capsules. Prescription sent electronically to pharmacy. Daughter (DPR) notified.

## 2019-08-04 NOTE — Telephone Encounter (Signed)
Daughter called back at [phone] no voice mail set up. No answer.

## 2019-08-04 NOTE — Telephone Encounter (Signed)
Another note by PCP sent in abx for pt. symptoms

## 2019-08-05 ENCOUNTER — Encounter (HOSPITAL_COMMUNITY): Payer: Self-pay | Admitting: Emergency Medicine

## 2019-08-05 ENCOUNTER — Emergency Department (HOSPITAL_COMMUNITY)
Admission: EM | Admit: 2019-08-05 | Discharge: 2019-08-05 | Disposition: A | Discharge status: Home / Self Care | Payer: Medicare Other | Attending: Emergency Medicine | Admitting: Emergency Medicine

## 2019-08-05 ENCOUNTER — Other Ambulatory Visit: Payer: Self-pay

## 2019-08-05 DIAGNOSIS — T83511A Infection and inflammatory reaction due to indwelling urethral catheter, initial encounter: Secondary | ICD-10-CM | POA: Insufficient documentation

## 2019-08-05 DIAGNOSIS — Z7901 Long term (current) use of anticoagulants: Secondary | ICD-10-CM | POA: Diagnosis not present

## 2019-08-05 DIAGNOSIS — I69328 Other speech and language deficits following cerebral infarction: Secondary | ICD-10-CM | POA: Diagnosis not present

## 2019-08-05 DIAGNOSIS — T839XXA Unspecified complication of genitourinary prosthetic device, implant and graft, initial encounter: Secondary | ICD-10-CM

## 2019-08-05 DIAGNOSIS — Z79899 Other long term (current) drug therapy: Secondary | ICD-10-CM | POA: Diagnosis not present

## 2019-08-05 DIAGNOSIS — Z87891 Personal history of nicotine dependence: Secondary | ICD-10-CM | POA: Insufficient documentation

## 2019-08-05 DIAGNOSIS — T83031A Leakage of indwelling urethral catheter, initial encounter: Secondary | ICD-10-CM | POA: Diagnosis present

## 2019-08-05 DIAGNOSIS — R69 Illness, unspecified: Secondary | ICD-10-CM | POA: Diagnosis not present

## 2019-08-05 DIAGNOSIS — I10 Essential (primary) hypertension: Secondary | ICD-10-CM | POA: Insufficient documentation

## 2019-08-05 DIAGNOSIS — Z452 Encounter for adjustment and management of vascular access device: Secondary | ICD-10-CM | POA: Diagnosis not present

## 2019-08-05 DIAGNOSIS — Z8546 Personal history of malignant neoplasm of prostate: Secondary | ICD-10-CM | POA: Insufficient documentation

## 2019-08-05 DIAGNOSIS — N390 Urinary tract infection, site not specified: Secondary | ICD-10-CM | POA: Insufficient documentation

## 2019-08-05 DIAGNOSIS — E039 Hypothyroidism, unspecified: Secondary | ICD-10-CM | POA: Insufficient documentation

## 2019-08-05 LAB — URINALYSIS, ROUTINE W REFLEX MICROSCOPIC
Bilirubin Urine: NEGATIVE
Glucose, UA: NEGATIVE mg/dL
Ketones, ur: NEGATIVE mg/dL
Nitrite: POSITIVE — AB
Protein, ur: NEGATIVE mg/dL
Specific Gravity, Urine: 1.004 — ABNORMAL LOW (ref 1.005–1.030)
WBC, UA: >50 WBC/hpf — ABNORMAL HIGH (ref 0–5)
pH: 6 (ref 5.0–8.0)

## 2019-08-05 LAB — CBC WITH DIFFERENTIAL/PLATELET
Abs Immature Granulocytes: 0.02 10*3/uL (ref 0.00–0.07)
Basophils Absolute: 0 10*3/uL (ref 0.0–0.1)
Basophils Relative: 1 %
Eosinophils Absolute: 0.2 10*3/uL (ref 0.0–0.5)
Eosinophils Relative: 4 %
HCT: 40.4 % (ref 39.0–52.0)
Hemoglobin: 13.1 g/dL (ref 13.0–17.0)
Immature Granulocytes: 0 %
Lymphocytes Relative: 22 %
Lymphs Abs: 1 10*3/uL (ref 0.7–4.0)
MCH: 31.5 pg (ref 26.0–34.0)
MCHC: 32.4 g/dL (ref 30.0–36.0)
MCV: 97.1 fL (ref 80.0–100.0)
Monocytes Absolute: 0.7 10*3/uL (ref 0.1–1.0)
Monocytes Relative: 14 %
Neutro Abs: 2.7 10*3/uL (ref 1.7–7.7)
Neutrophils Relative %: 59 %
Platelets: 160 10*3/uL (ref 150–400)
RBC: 4.16 MIL/uL — ABNORMAL LOW (ref 4.22–5.81)
RDW: 14.2 % (ref 11.5–15.5)
WBC: 4.7 10*3/uL (ref 4.0–10.5)
nRBC: 0 % (ref 0.0–0.2)

## 2019-08-05 MED ORDER — TRAMADOL HCL 50 MG PO TABS
50.0000 mg | ORAL_TABLET | Freq: Once | ORAL | Status: AC
  Administered 2019-08-05: 50 mg via ORAL
  Filled 2019-08-05: qty 1

## 2019-08-05 MED ORDER — SODIUM CHLORIDE 0.9 % IV SOLN
1.0000 g | Freq: Once | INTRAVENOUS | Status: AC
  Administered 2019-08-05: 1 g via INTRAVENOUS
  Filled 2019-08-05: qty 10

## 2019-08-05 MED ORDER — MORPHINE SULFATE (PF) 2 MG/ML IV SOLN
1.0000 mg | Freq: Once | INTRAVENOUS | Status: AC
  Administered 2019-08-05: 1 mg via INTRAVENOUS
  Filled 2019-08-05: qty 1

## 2019-08-05 MED ORDER — CIPROFLOXACIN HCL 500 MG PO TABS: 500.0000 mg | ORAL_TABLET | Freq: Two times a day (BID) | ORAL | 0 refills | Status: DC

## 2019-08-05 NOTE — ED Notes (Addendum)
Patient has a foley 22 french PTA. It was found to have 25 ml in the balloon instead of the required 30 ml to keep the catheter in place and without leaks.

## 2019-08-05 NOTE — ED Provider Notes (Signed)
[facility] EMERGENCY DEPARTMENT Provider Note   CSN: [id number] Arrival date & time: 08/05/19  1558     History Chief Complaint  Patient presents with  . catheter issue    Ethan Faulkner is a 86 y.o. male.  HPI      Ethan Faulkner is a 86 y.o. male with hx of atrial fib, prostate cancer, stroke and he is nonverbal at baseline,  who presents to the Emergency Department from home due to leaking from his foley catheter.  I spoke with his daughter, Armando Gang, and she reports that home health changed out the catheter one week ago and he is currently being treated for a UTI and taking Keflex.  The daughter states that he has appeared to be in pain today and keeps "holding himself down there" and she noticed that he has urine and "discharge" that has been leaking from the insertion site.   She denies decreased appetite, vomiting, fever or chills.  He is currently anticoagulated with Eliquis.      Past Medical History:  Diagnosis Date  . Afib ([place])   . Atrial fibrillation ([place])   . Cerebral infarction ([place])   . DDD (degenerative disc disease), lumbar   . Dysphagia   . Hypertension   . Hypothyroidism   . Migraine    "maybe monthly" (11/21/2015)  . Prostate cancer ([place]) ~ 1995   S/P radiation tx  . Renal mass    "slow growing something; doctors just watching it right now" (11/21/2015)  . Stroke [facility])    TIA  . Thrombocytopenia ([place])   . Thyroid disease    hypothyroid  . UTI (urinary tract infection) 03/20/2019    Patient Active Problem List   Diagnosis Date Noted  . Urinary retention 06/23/2019  . Presence of indwelling urinary catheter 06/23/2019  . Frequent UTI 04/04/2019  . Left hemiparesis ([place]) 03/18/2019  . Acute cystitis with hematuria   . Sepsis ([place]) 05/06/2018  . Hyperbilirubinemia 05/06/2018  . Chronic anemia 05/06/2018  . Hyponatremia 05/06/2018  . Physical deconditioning 05/06/2018  . HTN (hypertension) 05/06/2018  . Hypokalemia 12/08/2017  .  Nausea and vomiting   . Ileus ([place])   . Urinary tract infection associated with indwelling urethral catheter ([place])   . Small bowel obstruction ([place]) 12/05/2017  . Acute lower UTI 12/05/2017  . Hyperlipidemia 11/09/2017  . Gross hematuria 04/06/2017  . History of stroke 04/06/2017  . Hematuria, unspecified 02/19/2017  . Cerebrovascular accident (CVA) due to embolism of left middle cerebral artery ([place]) 02/14/2017  . Multi-infarct dementia due to atherosclerosis ([place]) 12/03/2016  . Leukopenia   . Thrombocytopenia ([place])   . Fall   . Acute blood loss anemia   . Femoral neck fracture ([place]) 11/20/2015  . Renal malignant tumor ([place]) 06/06/2015  . Long term current use of anticoagulant therapy 04/29/2015  . Hypothyroidism 03/01/2014  . Atrial fibrillation ([place]) 06/09/2013  . STRESS FRACTURE, FOOT 12/12/2008  . CLOSED FRACTURE OF METATARSAL BONE 12/12/2008    Past Surgical History:  Procedure Laterality Date  . COLONOSCOPY  1999  . ESOPHAGOGASTRODUODENOSCOPY    . LAPAROSCOPIC CHOLECYSTECTOMY  1980s  . PROSTATE BIOPSY  ~ 1995  . TOTAL HIP ARTHROPLASTY Left 11/22/2015   Procedure: LEFT HIP HEMIARTHROPLASTY ;  Surgeon: Leandrew Koyanagi, MD;  Location: [place] [facility];  Service: Orthopedics;  Laterality: Left;       Family History  Problem Relation Age of Onset  . Stroke Sister  in her 75s  . CAD Neg Hx   . COPD Neg Hx   . Diabetes Neg Hx   . Heart disease Neg Hx     Social History   Tobacco Use  . Smoking status: Former Research scientist (life sciences)  . Smokeless tobacco: Never Used  . Tobacco comment: 11/21/2015 "might have smoked 2 packs of cigarettes in my lifetime"  Substance Use Topics  . Alcohol use: Yes    Comment: 11/21/2015 "last beer I've had was in ~ 2007"  . Drug use: No    Home Medications Prior to Admission medications   Medication Sig Start Date End Date Taking? Authorizing Provider  amLODipine (NORVASC) 5 MG tablet TAKE (1) TABLET BY MOUTH ONCE DAILY. 08/01/19   Kathyrn Drown, MD    atorvastatin (LIPITOR) 20 MG tablet TAKE 1 TABLET BY MOUTH ONCE DAILY AT 6:00 PM. 07/19/19   Luking, Elayne Snare, MD  cephALEXin (KEFLEX) 250 MG capsule Take 1 capsule (250 mg total) by mouth 4 (four) times daily. 08/02/19   Kathyrn Drown, MD  cetirizine (ZYRTEC) 10 MG tablet Take 10 mg by mouth every evening.     [provider]  docusate sodium (COLACE) 100 MG capsule Take 100 mg by mouth daily.    [provider]  ELIQUIS 2.5 MG TABS tablet TAKE (1) TABLET BY MOUTH TWICE DAILY 9AM AND 9PM. 02/09/19   Kathyrn Drown, MD  guaiFENesin-dextromethorphan (ROBITUSSIN DM) 100-10 MG/5ML syrup Take 5 mLs by mouth every 4 (four) hours as needed for cough. 03/21/19   Dessa Phi, DO  levothyroxine (SYNTHROID) 25 MCG tablet TAKE 1 TABLET EVERY MORNING ON AN EMPTY STOMACH FOR THYROID. 07/11/19   Kathyrn Drown, MD  lidocaine (XYLOCAINE) 2 % jelly Place 1 application into the urethra as needed. Insert into urethra 5 minutes prior  prior to foley insertion 06/23/19   Irine Seal, MD  metoprolol succinate (TOPROL-XL) 25 MG 24 hr tablet TAKE (1) TABLET BY MOUTH ONCE DAILY. 02/28/19   Kathyrn Drown, MD  nystatin (MYCOSTATIN/NYSTOP) powder Apply 1 application topically 2 (two) times daily. 07/04/19   Irine Seal, MD  QC NATURA-LAX 17 GM/SCOOP powder MIX 1 CAPFUL IN 8 OZ OF WATER AND DRINK TWICE WEEKLY. 12/06/18   Kathyrn Drown, MD  traMADol (ULTRAM) 50 MG tablet TAKE ONE TABLET 3 TIMES DAILY AS NEEDED FOR SEVERE PAIN. 02/28/19   Kathyrn Drown, MD    Allergies    Xanax [alprazolam] and Tape  Review of Systems   Review of Systems  Unable to perform ROS: Patient nonverbal    Physical Exam Updated Vital Signs BP (!) 158/64 (BP Location: Right Arm)   Pulse 89   Temp 98 F (36.7 C) (Oral)   Resp 16   Ht 5\' 7"  (1.702 m)   Wt 72.6 kg   SpO2 98%   BMI 25.07 kg/m   Physical Exam Vitals and nursing note reviewed.  Constitutional:      Appearance: He is not ill-appearing or  toxic-appearing.     Comments: Pt is frail appearing  HENT:     Head: Atraumatic.     Mouth/Throat:     Mouth: Mucous membranes are moist.  Cardiovascular:     Rate and Rhythm: Normal rate. Rhythm irregular.     Pulses: Normal pulses.  Pulmonary:     Effort: Pulmonary effort is normal. No respiratory distress.     Breath sounds: Normal breath sounds.  Chest:     Chest wall: No  tenderness.  Abdominal:     General: There is no distension.     Palpations: Abdomen is soft.     Tenderness: There is no abdominal tenderness. There is no right CVA tenderness, left CVA tenderness or guarding.  Musculoskeletal:     Right lower leg: No edema.     Left lower leg: No edema.  Skin:    General: Skin is warm.     Capillary Refill: Capillary refill takes less than 2 seconds.     Findings: No erythema or rash.  Neurological:     General: No focal deficit present.     Mental Status: He is alert.     Sensory: Sensation is intact.     Motor: No weakness.     Comments: Pt non-verbal at baseline secondary to a stroke, he is alert and follows commands well.       ED Results / Procedures / Treatments   Labs (all labs ordered are listed, but only abnormal results are displayed) Labs Reviewed  URINALYSIS, ROUTINE W REFLEX MICROSCOPIC - Abnormal; Notable for the following components:      Result Value   Color, Urine AMBER (*)    Specific Gravity, Urine 1.004 (*)    Hgb urine dipstick MODERATE (*)    Nitrite POSITIVE (*)    Leukocytes,Ua MODERATE (*)    WBC, UA >50 (*)    Bacteria, UA RARE (*)    All other components within normal limits  CBC WITH DIFFERENTIAL/PLATELET - Abnormal; Notable for the following components:   RBC 4.16 (*)    All other components within normal limits  URINE CULTURE    EKG None  Radiology No results found.  Procedures Procedures (including critical care time)  Medications Ordered in ED Medications  traMADol (ULTRAM) tablet 50 mg (50 mg Oral Given 08/05/19  1714)  cefTRIAXone (ROCEPHIN) 1 g in sodium chloride 0.9 % 100 mL IVPB (0 g Intravenous Stopped 08/05/19 1853)  morphine 2 MG/ML injection 1 mg (1 mg Intravenous Given 08/05/19 1823)    ED Course  I have reviewed the triage vital signs and the nursing notes.  Pertinent labs & imaging results that were available during my care of the patient were reviewed by me and considered in my medical decision making (see chart for details).    MDM Rules/Calculators/A&P                      Pt here with leaking from his foley catheter.  On arrival, nursing staff noted that catheter balloon was poorly inflated.  Resolution of leaking urine after balloon was inflated with 30 mL of saline.  Bladder scan revealed 15 cc of urine within the bladder.  Pt is non-verbal at baseline, but follows commands well and is non-toxic appearing although he does appear uncomfortable.  He was given pain medication and on recheck, appears to be feeling better.  He is currently being treated with Kelfex for a UTI.  His last urine culture from 10/20, grew Pseudomonas aeruginosa.  I will have him discontinue the Keflex and start Cipro.  Urine culture is pending.  Vital signs reviewed.  No concerning symptoms for sepsis.  Daughter agrees to close follow-up with PCP.  Return precautions were also discussed.   Final Clinical Impression(s) / ED Diagnoses Final diagnoses:  Foley catheter problem, initial encounter ([place])  Urinary tract infection associated with indwelling urethral catheter, initial encounter [facility])    Rx / DC Orders ED Discharge Orders  None       Kem Parkinson, Hershal Coria 08/05/19 2103    Margette Fast, MD 08/06/19 1322

## 2019-08-05 NOTE — ED Triage Notes (Signed)
Catheter placed by home health one week ago  Catheter started leaking today   Rather than call home health, "they were tired of dealing with them"   Pt also has a history of UTI followed by Dr Jeffie Pollock

## 2019-08-05 NOTE — Discharge Instructions (Addendum)
Stop the cephalexin and start the Cipro prescription tomorrow.  Encourage fluids.  Follow-up with his primary care provider or Dr. Jeffie Pollock for recheck if needed.  Return to the emergency department if he develops worsening symptoms such as fever, chills, vomiting, or increasing pain.

## 2019-08-07 DIAGNOSIS — I480 Paroxysmal atrial fibrillation: Secondary | ICD-10-CM | POA: Diagnosis not present

## 2019-08-07 DIAGNOSIS — Z466 Encounter for fitting and adjustment of urinary device: Secondary | ICD-10-CM | POA: Diagnosis not present

## 2019-08-07 DIAGNOSIS — R339 Retention of urine, unspecified: Secondary | ICD-10-CM | POA: Diagnosis not present

## 2019-08-07 DIAGNOSIS — I69320 Aphasia following cerebral infarction: Secondary | ICD-10-CM | POA: Diagnosis not present

## 2019-08-07 DIAGNOSIS — C61 Malignant neoplasm of prostate: Secondary | ICD-10-CM | POA: Diagnosis not present

## 2019-08-07 DIAGNOSIS — I10 Essential (primary) hypertension: Secondary | ICD-10-CM | POA: Diagnosis not present

## 2019-08-08 LAB — URINE CULTURE: Culture: >=100000 — AB

## 2019-08-11 DIAGNOSIS — Z466 Encounter for fitting and adjustment of urinary device: Secondary | ICD-10-CM | POA: Diagnosis not present

## 2019-08-15 ENCOUNTER — Other Ambulatory Visit: Payer: Self-pay | Admitting: Family Medicine

## 2019-08-16 DIAGNOSIS — Z23 Encounter for immunization: Secondary | ICD-10-CM | POA: Diagnosis not present

## 2019-08-28 ENCOUNTER — Other Ambulatory Visit: Payer: Self-pay

## 2019-08-28 ENCOUNTER — Encounter (HOSPITAL_COMMUNITY): Payer: Self-pay

## 2019-08-28 ENCOUNTER — Emergency Department (HOSPITAL_COMMUNITY): Payer: Medicare Other

## 2019-08-28 ENCOUNTER — Inpatient Hospital Stay (HOSPITAL_COMMUNITY)
Admission: EM | Admit: 2019-08-28 | Discharge: 2019-08-30 | DRG: 179 | Disposition: A | Discharge status: Home Health | Payer: Medicare Other | Attending: Internal Medicine | Admitting: Internal Medicine

## 2019-08-28 DIAGNOSIS — I4891 Unspecified atrial fibrillation: Secondary | ICD-10-CM | POA: Diagnosis not present

## 2019-08-28 DIAGNOSIS — Z8673 Personal history of transient ischemic attack (TIA), and cerebral infarction without residual deficits: Secondary | ICD-10-CM

## 2019-08-28 DIAGNOSIS — R1084 Generalized abdominal pain: Secondary | ICD-10-CM | POA: Diagnosis not present

## 2019-08-28 DIAGNOSIS — R062 Wheezing: Secondary | ICD-10-CM | POA: Diagnosis not present

## 2019-08-28 DIAGNOSIS — E785 Hyperlipidemia, unspecified: Secondary | ICD-10-CM | POA: Diagnosis present

## 2019-08-28 DIAGNOSIS — Z7901 Long term (current) use of anticoagulants: Secondary | ICD-10-CM

## 2019-08-28 DIAGNOSIS — J690 Pneumonitis due to inhalation of food and vomit: Secondary | ICD-10-CM | POA: Diagnosis not present

## 2019-08-28 DIAGNOSIS — R112 Nausea with vomiting, unspecified: Secondary | ICD-10-CM | POA: Diagnosis not present

## 2019-08-28 DIAGNOSIS — J90 Pleural effusion, not elsewhere classified: Secondary | ICD-10-CM | POA: Diagnosis not present

## 2019-08-28 DIAGNOSIS — I119 Hypertensive heart disease without heart failure: Secondary | ICD-10-CM | POA: Diagnosis present

## 2019-08-28 DIAGNOSIS — Z823 Family history of stroke: Secondary | ICD-10-CM

## 2019-08-28 DIAGNOSIS — Z888 Allergy status to other drugs, medicaments and biological substances status: Secondary | ICD-10-CM

## 2019-08-28 DIAGNOSIS — Z20822 Contact with and (suspected) exposure to covid-19: Secondary | ICD-10-CM | POA: Diagnosis present

## 2019-08-28 DIAGNOSIS — Z923 Personal history of irradiation: Secondary | ICD-10-CM

## 2019-08-28 DIAGNOSIS — Z66 Do not resuscitate: Secondary | ICD-10-CM | POA: Diagnosis present

## 2019-08-28 DIAGNOSIS — Z8546 Personal history of malignant neoplasm of prostate: Secondary | ICD-10-CM

## 2019-08-28 DIAGNOSIS — M5136 Other intervertebral disc degeneration, lumbar region: Secondary | ICD-10-CM | POA: Diagnosis present

## 2019-08-28 DIAGNOSIS — Z87891 Personal history of nicotine dependence: Secondary | ICD-10-CM

## 2019-08-28 DIAGNOSIS — Z9049 Acquired absence of other specified parts of digestive tract: Secondary | ICD-10-CM

## 2019-08-28 DIAGNOSIS — E039 Hypothyroidism, unspecified: Secondary | ICD-10-CM | POA: Diagnosis present

## 2019-08-28 DIAGNOSIS — Z96642 Presence of left artificial hip joint: Secondary | ICD-10-CM | POA: Diagnosis present

## 2019-08-28 DIAGNOSIS — K5900 Constipation, unspecified: Secondary | ICD-10-CM | POA: Diagnosis not present

## 2019-08-28 DIAGNOSIS — Z889 Allergy status to unspecified drugs, medicaments and biological substances status: Secondary | ICD-10-CM

## 2019-08-28 DIAGNOSIS — R1110 Vomiting, unspecified: Secondary | ICD-10-CM | POA: Diagnosis not present

## 2019-08-28 DIAGNOSIS — Z79890 Hormone replacement therapy (postmenopausal): Secondary | ICD-10-CM

## 2019-08-28 LAB — BASIC METABOLIC PANEL
Anion gap: 10 (ref 5–15)
BUN: 24 mg/dL — ABNORMAL HIGH (ref 8–23)
CO2: 18 mmol/L — ABNORMAL LOW (ref 22–32)
Calcium: 9.1 mg/dL (ref 8.9–10.3)
Chloride: 105 mmol/L (ref 98–111)
Creatinine, Ser: 0.83 mg/dL (ref 0.61–1.24)
GFR calc Af Amer: >60 mL/min (ref >60)
GFR calc non Af Amer: >60 mL/min (ref >60)
Glucose, Bld: 154 mg/dL — ABNORMAL HIGH (ref 70–99)
Potassium: 4 mmol/L (ref 3.5–5.1)
Sodium: 133 mmol/L — ABNORMAL LOW (ref 135–145)

## 2019-08-28 LAB — HEPATIC FUNCTION PANEL
ALT: 17 U/L (ref 0–44)
AST: 21 U/L (ref 15–41)
Albumin: 3.9 g/dL (ref 3.5–5.0)
Alkaline Phosphatase: 62 U/L (ref 38–126)
Bilirubin, Direct: 0.3 mg/dL — ABNORMAL HIGH (ref 0.0–0.2)
Indirect Bilirubin: 1.1 mg/dL — ABNORMAL HIGH (ref 0.3–0.9)
Total Bilirubin: 1.4 mg/dL — ABNORMAL HIGH (ref 0.3–1.2)
Total Protein: 6.8 g/dL (ref 6.5–8.1)

## 2019-08-28 LAB — POC OCCULT BLOOD, ED: Fecal Occult Bld: NEGATIVE

## 2019-08-28 LAB — LIPASE, BLOOD: Lipase: 33 U/L (ref 11–51)

## 2019-08-28 MED ORDER — IOHEXOL 300 MG/ML  SOLN
100.0000 mL | Freq: Once | INTRAMUSCULAR | Status: AC | PRN
  Administered 2019-08-28: 100 mL via INTRAVENOUS

## 2019-08-28 MED ORDER — MAGNESIUM CITRATE PO SOLN: 0.5000 | Freq: Once | ORAL | Status: DC

## 2019-08-28 MED ORDER — ONDANSETRON HCL 4 MG/2ML IJ SOLN
4.0000 mg | Freq: Once | INTRAMUSCULAR | Status: AC
  Administered 2019-08-28: 4 mg via INTRAVENOUS
  Filled 2019-08-28: qty 2

## 2019-08-28 NOTE — ED Triage Notes (Signed)
Pt arrived EMS and stated family said he had not had a BM in 6 days. They gave him Miralax, and 2 colace today with no results. Per EMS pt has had both coivd vaccines. 22 fr foley attached to pt. Med list from family at bedside. Pt not able to answer questions.

## 2019-08-29 ENCOUNTER — Other Ambulatory Visit: Payer: Self-pay

## 2019-08-29 ENCOUNTER — Emergency Department (HOSPITAL_COMMUNITY): Payer: Medicare Other

## 2019-08-29 DIAGNOSIS — J690 Pneumonitis due to inhalation of food and vomit: Secondary | ICD-10-CM | POA: Diagnosis present

## 2019-08-29 DIAGNOSIS — E785 Hyperlipidemia, unspecified: Secondary | ICD-10-CM | POA: Diagnosis present

## 2019-08-29 DIAGNOSIS — I119 Hypertensive heart disease without heart failure: Secondary | ICD-10-CM | POA: Diagnosis present

## 2019-08-29 DIAGNOSIS — Z66 Do not resuscitate: Secondary | ICD-10-CM | POA: Diagnosis present

## 2019-08-29 DIAGNOSIS — K5900 Constipation, unspecified: Secondary | ICD-10-CM

## 2019-08-29 DIAGNOSIS — Z8673 Personal history of transient ischemic attack (TIA), and cerebral infarction without residual deficits: Secondary | ICD-10-CM | POA: Diagnosis not present

## 2019-08-29 DIAGNOSIS — Z823 Family history of stroke: Secondary | ICD-10-CM | POA: Diagnosis not present

## 2019-08-29 DIAGNOSIS — R1110 Vomiting, unspecified: Secondary | ICD-10-CM | POA: Diagnosis not present

## 2019-08-29 DIAGNOSIS — M5136 Other intervertebral disc degeneration, lumbar region: Secondary | ICD-10-CM | POA: Diagnosis present

## 2019-08-29 DIAGNOSIS — Z20822 Contact with and (suspected) exposure to covid-19: Secondary | ICD-10-CM | POA: Diagnosis present

## 2019-08-29 DIAGNOSIS — Z889 Allergy status to unspecified drugs, medicaments and biological substances status: Secondary | ICD-10-CM | POA: Diagnosis not present

## 2019-08-29 DIAGNOSIS — Z87891 Personal history of nicotine dependence: Secondary | ICD-10-CM | POA: Diagnosis not present

## 2019-08-29 DIAGNOSIS — E039 Hypothyroidism, unspecified: Secondary | ICD-10-CM | POA: Diagnosis present

## 2019-08-29 DIAGNOSIS — Z8546 Personal history of malignant neoplasm of prostate: Secondary | ICD-10-CM | POA: Diagnosis not present

## 2019-08-29 DIAGNOSIS — R062 Wheezing: Secondary | ICD-10-CM | POA: Diagnosis not present

## 2019-08-29 DIAGNOSIS — Z7901 Long term (current) use of anticoagulants: Secondary | ICD-10-CM | POA: Diagnosis not present

## 2019-08-29 DIAGNOSIS — I4891 Unspecified atrial fibrillation: Secondary | ICD-10-CM | POA: Diagnosis present

## 2019-08-29 DIAGNOSIS — Z9049 Acquired absence of other specified parts of digestive tract: Secondary | ICD-10-CM | POA: Diagnosis not present

## 2019-08-29 DIAGNOSIS — Z888 Allergy status to other drugs, medicaments and biological substances status: Secondary | ICD-10-CM | POA: Diagnosis not present

## 2019-08-29 DIAGNOSIS — Z923 Personal history of irradiation: Secondary | ICD-10-CM | POA: Diagnosis not present

## 2019-08-29 DIAGNOSIS — J90 Pleural effusion, not elsewhere classified: Secondary | ICD-10-CM | POA: Diagnosis not present

## 2019-08-29 DIAGNOSIS — Z96642 Presence of left artificial hip joint: Secondary | ICD-10-CM | POA: Diagnosis present

## 2019-08-29 DIAGNOSIS — R112 Nausea with vomiting, unspecified: Secondary | ICD-10-CM | POA: Diagnosis not present

## 2019-08-29 DIAGNOSIS — Z79890 Hormone replacement therapy (postmenopausal): Secondary | ICD-10-CM | POA: Diagnosis not present

## 2019-08-29 LAB — URINALYSIS, ROUTINE W REFLEX MICROSCOPIC
Bilirubin Urine: NEGATIVE
Glucose, UA: NEGATIVE mg/dL
Ketones, ur: NEGATIVE mg/dL
Nitrite: NEGATIVE
Protein, ur: 30 mg/dL — AB
Specific Gravity, Urine: 1.017 (ref 1.005–1.030)
pH: 5 (ref 5.0–8.0)

## 2019-08-29 LAB — COMPREHENSIVE METABOLIC PANEL
ALT: 19 U/L (ref 0–44)
AST: 23 U/L (ref 15–41)
Albumin: 3.6 g/dL (ref 3.5–5.0)
Alkaline Phosphatase: 56 U/L (ref 38–126)
Anion gap: 8 (ref 5–15)
BUN: 24 mg/dL — ABNORMAL HIGH (ref 8–23)
CO2: 21 mmol/L — ABNORMAL LOW (ref 22–32)
Calcium: 9.1 mg/dL (ref 8.9–10.3)
Chloride: 105 mmol/L (ref 98–111)
Creatinine, Ser: 0.83 mg/dL (ref 0.61–1.24)
GFR calc Af Amer: >60 mL/min (ref >60)
GFR calc non Af Amer: >60 mL/min (ref >60)
Glucose, Bld: 131 mg/dL — ABNORMAL HIGH (ref 70–99)
Potassium: 4.5 mmol/L (ref 3.5–5.1)
Sodium: 134 mmol/L — ABNORMAL LOW (ref 135–145)
Total Bilirubin: 1.6 mg/dL — ABNORMAL HIGH (ref 0.3–1.2)
Total Protein: 6.3 g/dL — ABNORMAL LOW (ref 6.5–8.1)

## 2019-08-29 LAB — CBC
HCT: 36.8 % — ABNORMAL LOW (ref 39.0–52.0)
Hemoglobin: 11.9 g/dL — ABNORMAL LOW (ref 13.0–17.0)
MCH: 31.2 pg (ref 26.0–34.0)
MCHC: 32.3 g/dL (ref 30.0–36.0)
MCV: 96.6 fL (ref 80.0–100.0)
Platelets: 149 10*3/uL — ABNORMAL LOW (ref 150–400)
RBC: 3.81 MIL/uL — ABNORMAL LOW (ref 4.22–5.81)
RDW: 14 % (ref 11.5–15.5)
WBC: 8.1 10*3/uL (ref 4.0–10.5)
nRBC: 0 % (ref 0.0–0.2)

## 2019-08-29 LAB — BRAIN NATRIURETIC PEPTIDE: B Natriuretic Peptide: 333 pg/mL — ABNORMAL HIGH (ref 0.0–100.0)

## 2019-08-29 LAB — SARS CORONAVIRUS 2 (TAT 6-24 HRS): SARS Coronavirus 2: NEGATIVE

## 2019-08-29 MED ORDER — METOPROLOL SUCCINATE ER 25 MG PO TB24
25.0000 mg | ORAL_TABLET | Freq: Every evening | ORAL | Status: DC
  Administered 2019-08-29: 25 mg via ORAL
  Filled 2019-08-29: qty 1

## 2019-08-29 MED ORDER — ONDANSETRON HCL 4 MG/2ML IJ SOLN: 4.0000 mg | Freq: Four times a day (QID) | INTRAMUSCULAR | Status: DC | PRN

## 2019-08-29 MED ORDER — SODIUM CHLORIDE 0.9% FLUSH: 3.0000 mL | INTRAVENOUS | Status: DC | PRN

## 2019-08-29 MED ORDER — AMLODIPINE BESYLATE 5 MG PO TABS
5.0000 mg | ORAL_TABLET | Freq: Every morning | ORAL | Status: DC
  Administered 2019-08-30: 5 mg via ORAL
  Filled 2019-08-29 (×2): qty 1

## 2019-08-29 MED ORDER — SODIUM CHLORIDE 0.9 % IV SOLN: 250.0000 mL | INTRAVENOUS | Status: DC | PRN

## 2019-08-29 MED ORDER — SODIUM CHLORIDE 0.9 % IV SOLN: INTRAVENOUS | Status: AC

## 2019-08-29 MED ORDER — DOCUSATE SODIUM 100 MG PO CAPS
100.0000 mg | ORAL_CAPSULE | Freq: Two times a day (BID) | ORAL | Status: DC
  Filled 2019-08-29: qty 1

## 2019-08-29 MED ORDER — ACETAMINOPHEN 650 MG RE SUPP: 650.0000 mg | Freq: Four times a day (QID) | RECTAL | Status: DC | PRN

## 2019-08-29 MED ORDER — SODIUM CHLORIDE 0.9% FLUSH
3.0000 mL | Freq: Two times a day (BID) | INTRAVENOUS | Status: DC
  Administered 2019-08-29: 11:00:00 3 mL via INTRAVENOUS

## 2019-08-29 MED ORDER — ONDANSETRON HCL 4 MG PO TABS: 4.0000 mg | ORAL_TABLET | Freq: Four times a day (QID) | ORAL | Status: DC | PRN

## 2019-08-29 MED ORDER — LEVOTHYROXINE SODIUM 25 MCG PO TABS
25.0000 ug | ORAL_TABLET | Freq: Every day | ORAL | Status: DC
  Administered 2019-08-30: 25 ug via ORAL
  Filled 2019-08-29 (×3): qty 1

## 2019-08-29 MED ORDER — LORATADINE 10 MG PO TABS
10.0000 mg | ORAL_TABLET | Freq: Every day | ORAL | Status: DC
  Filled 2019-08-29 (×3): qty 1

## 2019-08-29 MED ORDER — SODIUM CHLORIDE 0.9 % IV SOLN
1.5000 g | Freq: Four times a day (QID) | INTRAVENOUS | Status: DC
  Administered 2019-08-29 – 2019-08-30 (×6): 1.5 g via INTRAVENOUS
  Filled 2019-08-29 (×10): qty 4

## 2019-08-29 MED ORDER — IPRATROPIUM-ALBUTEROL 0.5-2.5 (3) MG/3ML IN SOLN
3.0000 mL | Freq: Two times a day (BID) | RESPIRATORY_TRACT | Status: DC
  Administered 2019-08-29 – 2019-08-30 (×3): 3 mL via RESPIRATORY_TRACT
  Filled 2019-08-29 (×3): qty 3

## 2019-08-29 MED ORDER — ATORVASTATIN CALCIUM 20 MG PO TABS
20.0000 mg | ORAL_TABLET | Freq: Every day | ORAL | Status: DC
  Administered 2019-08-29: 20 mg via ORAL
  Filled 2019-08-29: qty 1

## 2019-08-29 MED ORDER — GUAIFENESIN ER 600 MG PO TB12
600.0000 mg | ORAL_TABLET | Freq: Every day | ORAL | Status: DC
  Administered 2019-08-30: 600 mg via ORAL
  Filled 2019-08-29 (×3): qty 1

## 2019-08-29 MED ORDER — ACETAMINOPHEN 325 MG PO TABS: 650.0000 mg | ORAL_TABLET | Freq: Four times a day (QID) | ORAL | Status: DC | PRN

## 2019-08-29 MED ORDER — APIXABAN 2.5 MG PO TABS
2.5000 mg | ORAL_TABLET | Freq: Two times a day (BID) | ORAL | Status: DC
  Administered 2019-08-29 – 2019-08-30 (×2): 2.5 mg via ORAL
  Filled 2019-08-29 (×3): qty 1

## 2019-08-29 NOTE — Evaluation (Signed)
Clinical/Bedside Swallow Evaluation Patient Details  Name: Ethan Faulkner MRN: [id number] Date of Birth: Oct 07, 1915  Today's Date: 08/29/2019 Time: SLP Start Time (ACUTE ONLY): 1600 SLP Stop Time (ACUTE ONLY): 1620 SLP Time Calculation (min) (ACUTE ONLY): 20 min  Past Medical History:  Past Medical History:  Diagnosis Date  . Afib ([place])   . Atrial fibrillation ([place])   . Cerebral infarction ([place])   . DDD (degenerative disc disease), lumbar   . Dysphagia   . Hypertension   . Hypothyroidism   . Migraine    "maybe monthly" (11/21/2015)  . Prostate cancer ([place]) ~ 1995   S/P radiation tx  . Renal mass    "slow growing something; doctors just watching it right now" (11/21/2015)  . Stroke [facility])    TIA  . Thrombocytopenia ([place])   . Thyroid disease    hypothyroid  . UTI (urinary tract infection) 03/20/2019   Past Surgical History:  Past Surgical History:  Procedure Laterality Date  . COLONOSCOPY  1999  . ESOPHAGOGASTRODUODENOSCOPY    . LAPAROSCOPIC CHOLECYSTECTOMY  1980s  . PROSTATE BIOPSY  ~ 1995  . TOTAL HIP ARTHROPLASTY Left 11/22/2015   Procedure: LEFT HIP HEMIARTHROPLASTY ;  Surgeon: Leandrew Koyanagi, MD;  Location: [place];  Service: Orthopedics;  Laterality: Left;   HPI:  Ethan Faulkner  is a 86 y.o. male, with past medical history of atrial fibrillation, CVA, hypertension, renal mass was brought to the ED for evaluation of constipation.  Patient lives at home with children and caregivers providing around-the-clock care.  Patient has not had a BM for past 5 days.  Also complains of rectal pain with straining.  He has been receiving MiraLAX, Colace and suppositories without significant relief at home. Patient had bowel movement in the ED followed by vomiting of black liquid vomitus with food particles.  Patient was given Zofran.  After vomiting patient started coughing and developed wheezing.  There was concern for possible aspiration pneumonia.  Portable chest x-ray confirmed hazy  peripheral airspace opacities in the left upper lung zone, may represent pulmonary edema or developing infiltrate. BSE requested.    Assessment / Plan / Recommendation Clinical Impression  Pt awakened for BSE, however this likely caused his agitation per caregiver. She indicates that Pt typically becomes confused if awakened and does better if allowed at awaken on his own. Oral motor examination was limited due to Pt combative. With the assist of the caregiver, he accepted one sip of water and a few bites of applesauce before swatting and grabbing SLP and caregiver. Pt reportedly took his medications whole with water today without incident. His caregiver reports that Pt consumes a soft diet at home and wears U/L dentures (not present) and drinks thin liquids with occasional coughing with liquids. No recent history of PNA, however does present so now possibly due to aspiration of emesis in ED? She indicates that it is challenging to get Pt to take in enough liquids and has concerns about thickening liquids and potentially making hydration more difficult. SLP will check back tomorrow (MD ordered D1/NTL).  SLP Visit Diagnosis: Dysphagia, unspecified (R13.10)    Aspiration Risk  Mild aspiration risk    Diet Recommendation Dysphagia 1 (Puree);Thin liquid   Liquid Administration via: Cup Medication Administration: Whole meds with liquid Supervision: Staff to assist with self feeding Compensations: Slow rate;Small sips/bites Postural Changes: Seated upright at 90 degrees;Remain upright for at least 30 minutes after po intake    Other  Recommendations Oral Care Recommendations:  Oral care BID Other Recommendations: Clarify dietary restrictions   Follow up Recommendations 24 hour supervision/assistance      Frequency and Duration min 2x/week  1 week       Prognosis Prognosis for Safe Diet Advancement: Fair Barriers to Reach Goals: Behavior Barriers/Prognosis Comment: Pt combative during  evaluation at this time      Swallow Study   General Date of Onset: 08/28/19 HPI: Ethan Faulkner  is a 86 y.o. male, with past medical history of atrial fibrillation, CVA, hypertension, renal mass was brought to the ED for evaluation of constipation.  Patient lives at home with children and caregivers providing around-the-clock care.  Patient has not had a BM for past 5 days.  Also complains of rectal pain with straining.  He has been receiving MiraLAX, Colace and suppositories without significant relief at home. Patient had bowel movement in the ED followed by vomiting of black liquid vomitus with food particles.  Patient was given Zofran.  After vomiting patient started coughing and developed wheezing.  There was concern for possible aspiration pneumonia.  Portable chest x-ray confirmed hazy peripheral airspace opacities in the left upper lung zone, may represent pulmonary edema or developing infiltrate. BSE requested.  Type of Study: Bedside Swallow Evaluation Diet Prior to this Study: Dysphagia 1 (puree);Nectar-thick liquids Temperature Spikes Noted: No Respiratory Status: Nasal cannula History of Recent Intubation: No Behavior/Cognition: Alert;Agitated Oral Cavity Assessment: (unable to assess due to combativeness) Oral Care Completed by SLP: No Oral Cavity - Dentition: Edentulous(Pt typically wears U/L dentures at home) Vision: Functional for self-feeding Self-Feeding Abilities: Total assist Patient Positioning: Upright in bed Baseline Vocal Quality: Normal Volitional Cough: Cognitively unable to elicit Volitional Swallow: Unable to elicit    Oral/Motor/Sensory Function Overall Oral Motor/Sensory Function: (difficult to assess due to mentation and defensiveness)   Ice Chips Ice chips: Impaired Presentation: Spoon Oral Phase Impairments: Poor awareness of bolus   Thin Liquid Thin Liquid: Impaired Presentation: Cup Oral Phase Impairments: Poor awareness of bolus Pharyngeal  Phase  Impairments: (Pt took only one sip)    Nectar Thick Nectar Thick Liquid: Not tested   Honey Thick Honey Thick Liquid: Not tested   Puree Puree: Within functional limits Presentation: Spoon   Solid     Solid: Not tested     Thank you,  Genene Churn, [place]  Brayleigh Rybacki 08/29/2019,4:28 PM

## 2019-08-29 NOTE — Progress Notes (Signed)
PT Cancellation Note  Patient Details Name: Ethan Faulkner MRN: [id number] DOB: January 08, 1916   Cancelled Treatment:    Reason Eval/Treat Not Completed: Fatigue/lethargy limiting ability to participate;Medical issues which prohibited therapy;Patient's level of consciousness; Upon entrance for evaluation, daughter stated patient was lethargic today and had combative behavior earlier this morning. RN stated to hold evaluation today secondary to patient lethargy and level of consciousness.  10:44 AM, 08/29/19 Mearl Latin PT, DPT Physical Therapist at [facility]

## 2019-08-29 NOTE — Progress Notes (Signed)
ASSUMPTION OF CARE NOTE   08/29/2019 3:43 PM  Ethan Faulkner was seen and examined.  Pt was admitted earlier this morning with observed aspiration pneumonia that occurred in ED during episode of bilious emesis.  The H&P by the admitting provider, orders, imaging was reviewed.  Please see new orders.  Will follow. I spoke with daughter at bedside and updated.   Vitals:   08/29/19 0651 08/29/19 1119  BP: (!) 99/49   Pulse: 77   Resp: 18   Temp: 99.4 F (37.4 C)   SpO2: 96% 96%    Results for orders placed or performed during the hospital encounter of 08/28/19  SARS CORONAVIRUS 2 (TAT 6-24 HRS) Nasopharyngeal Nasopharyngeal Swab   Specimen: Nasopharyngeal Swab  Result Value Ref Range   SARS Coronavirus 2 NEGATIVE NEGATIVE  Culture, blood (Routine X 2) w Reflex to ID Panel   Specimen: BLOOD LEFT HAND  Result Value Ref Range   Specimen Description BLOOD LEFT HAND    Special Requests      BOTTLES DRAWN AEROBIC AND ANAEROBIC Blood Culture adequate volume   Culture      NO GROWTH < 12 HOURS Performed at [facility], [address] [place], [place] [address]    Report Status PENDING   Culture, blood (Routine X 2) w Reflex to ID Panel   Specimen: BLOOD RIGHT HAND  Result Value Ref Range   Specimen Description BLOOD RIGHT HAND    Special Requests      Blood Culture adequate volume BOTTLES DRAWN AEROBIC AND ANAEROBIC   Culture      NO GROWTH < 12 HOURS Performed at [facility], [address] [place], [place] [address]    Report Status PENDING   Basic metabolic panel  Result Value Ref Range   Sodium 133 (L) 135 - 145 mmol/L   Potassium 4.0 3.5 - 5.1 mmol/L   Chloride 105 98 - 111 mmol/L   CO2 18 (L) 22 - 32 mmol/L   Glucose, Bld 154 (H) 70 - 99 mg/dL   BUN 24 (H) 8 - 23 mg/dL   Creatinine, Ser 0.83 0.61 - 1.24 mg/dL   Calcium 9.1 8.9 - 10.3 mg/dL   GFR calc non Af Amer >60 >60 mL/min   GFR calc Af Amer >60 >60 mL/min   Anion gap 10 5 - 15  Urinalysis, Routine w reflex  microscopic  Result Value Ref Range   Color, Urine YELLOW YELLOW   APPearance CLEAR CLEAR   Specific Gravity, Urine 1.017 1.005 - 1.030   pH 5.0 5.0 - 8.0   Glucose, UA NEGATIVE NEGATIVE mg/dL   Hgb urine dipstick MODERATE (A) NEGATIVE   Bilirubin Urine NEGATIVE NEGATIVE   Ketones, ur NEGATIVE NEGATIVE mg/dL   Protein, ur 30 (A) NEGATIVE mg/dL   Nitrite NEGATIVE NEGATIVE   Leukocytes,Ua SMALL (A) NEGATIVE   RBC / HPF 6-10 0 - 5 RBC/hpf   WBC, UA 6-10 0 - 5 WBC/hpf   Bacteria, UA RARE (A) NONE SEEN   Squamous Epithelial / LPF 0-5 0 - 5   Mucus PRESENT   Hepatic function panel  Result Value Ref Range   Total Protein 6.8 6.5 - 8.1 g/dL   Albumin 3.9 3.5 - 5.0 g/dL   AST 21 15 - 41 U/L   ALT 17 0 - 44 U/L   Alkaline Phosphatase 62 38 - 126 U/L   Total Bilirubin 1.4 (H) 0.3 - 1.2 mg/dL   Bilirubin, Direct 0.3 (H) 0.0 -  0.2 mg/dL   Indirect Bilirubin 1.1 (H) 0.3 - 0.9 mg/dL  Lipase, blood  Result Value Ref Range   Lipase 33 11 - 51 U/L  Brain natriuretic peptide  Result Value Ref Range   B Natriuretic Peptide 333.0 (H) 0.0 - 100.0 pg/mL  CBC  Result Value Ref Range   WBC 8.1 4.0 - 10.5 K/uL   RBC 3.81 (L) 4.22 - 5.81 MIL/uL   Hemoglobin 11.9 (L) 13.0 - 17.0 g/dL   HCT 36.8 (L) 39.0 - 52.0 %   MCV 96.6 80.0 - 100.0 fL   MCH 31.2 26.0 - 34.0 pg   MCHC 32.3 30.0 - 36.0 g/dL   RDW 14.0 11.5 - 15.5 %   Platelets 149 (L) 150 - 400 K/uL   nRBC 0.0 0.0 - 0.2 %  Comprehensive metabolic panel  Result Value Ref Range   Sodium 134 (L) 135 - 145 mmol/L   Potassium 4.5 3.5 - 5.1 mmol/L   Chloride 105 98 - 111 mmol/L   CO2 21 (L) 22 - 32 mmol/L   Glucose, Bld 131 (H) 70 - 99 mg/dL   BUN 24 (H) 8 - 23 mg/dL   Creatinine, Ser 0.83 0.61 - 1.24 mg/dL   Calcium 9.1 8.9 - 10.3 mg/dL   Total Protein 6.3 (L) 6.5 - 8.1 g/dL   Albumin 3.6 3.5 - 5.0 g/dL   AST 23 15 - 41 U/L   ALT 19 0 - 44 U/L   Alkaline Phosphatase 56 38 - 126 U/L   Total Bilirubin 1.6 (H) 0.3 - 1.2 mg/dL   GFR calc  non Af Amer >60 >60 mL/min   GFR calc Af Amer >60 >60 mL/min   Anion gap 8 5 - 15  POC occult blood, ED  Result Value Ref Range   Fecal Occult Bld NEGATIVE NEGATIVE   Murvin Natal, MD [facility]   08/28/2019  8:21 PM How to contact the [facility] Attending or Consulting provider 7A - 7P or covering provider during after hours 7P -7A, for this patient?  1. Check the care team in [facility] and look for a) attending/consulting [facility] provider listed and b) the [facility] team listed 2. Log into [email or web address] and use [facility]'s universal password to access. If you do not have the password, please contact the hospital operator. 3. Locate the [facility] provider you are looking for under [facility] and page to a number that you can be directly reached. 4. If you still have difficulty reaching the provider, please page the [facility] (Director on Call) for the Hospitalists listed on amion for assistance.

## 2019-08-29 NOTE — ED Provider Notes (Signed)
[facility] EMERGENCY DEPARTMENT Provider Note   CSN: [id number] Arrival date & time: 08/28/19  2020     History Chief Complaint  Patient presents with  . Constipation    Ethan Faulkner is a 86 y.o. male with a history of atrial fibrillation, CVA, hypertension and known renal mass presenting for evaluation of constipation.  He lives at home with his children and caregivers giving round-the-clock care.  The daughter with him this evening reports that he has had 5 days without a bowel movement, except for very small firm stool prior to arrival.  He has also been having complaints of rectal pain with straining.  He has been receiving MiraLAX daily, several doses of Colace and family is also tried Dulcolax suppositories without significant relief.  There is been no fevers or chills, no nausea or vomiting, no abdominal distention.  His appetite has been fair.   HPI     Past Medical History:  Diagnosis Date  . Afib ([place])   . Atrial fibrillation ([place])   . Cerebral infarction ([place])   . DDD (degenerative disc disease), lumbar   . Dysphagia   . Hypertension   . Hypothyroidism   . Migraine    "maybe monthly" (11/21/2015)  . Prostate cancer ([place]) ~ 1995   S/P radiation tx  . Renal mass    "slow growing something; doctors just watching it right now" (11/21/2015)  . Stroke [facility])    TIA  . Thrombocytopenia ([place])   . Thyroid disease    hypothyroid  . UTI (urinary tract infection) 03/20/2019    Patient Active Problem List   Diagnosis Date Noted  . Urinary retention 06/23/2019  . Presence of indwelling urinary catheter 06/23/2019  . Frequent UTI 04/04/2019  . Left hemiparesis ([place]) 03/18/2019  . Acute cystitis with hematuria   . Sepsis ([place]) 05/06/2018  . Hyperbilirubinemia 05/06/2018  . Chronic anemia 05/06/2018  . Hyponatremia 05/06/2018  . Physical deconditioning 05/06/2018  . HTN (hypertension) 05/06/2018  . Hypokalemia 12/08/2017  . Nausea and vomiting   . Ileus ([place])     . Urinary tract infection associated with indwelling urethral catheter ([place])   . Small bowel obstruction ([place]) 12/05/2017  . Acute lower UTI 12/05/2017  . Hyperlipidemia 11/09/2017  . Gross hematuria 04/06/2017  . History of stroke 04/06/2017  . Hematuria, unspecified 02/19/2017  . Cerebrovascular accident (CVA) due to embolism of left middle cerebral artery ([place]) 02/14/2017  . 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 Leukopenia   . Thrombocytopenia ([place])   . Fall   . Acute blood loss anemia   . Femoral neck fracture ([place]) 11/20/2015  . Renal malignant tumor ([place]) 06/06/2015  . Long term current use of anticoagulant therapy 04/29/2015  . Hypothyroidism 03/01/2014  . Atrial fibrillation ([place]) 06/09/2013  . STRESS FRACTURE, FOOT 12/12/2008  . CLOSED FRACTURE OF METATARSAL BONE 12/12/2008    Past Surgical History:  Procedure Laterality Date  . COLONOSCOPY  1999  . ESOPHAGOGASTRODUODENOSCOPY    . LAPAROSCOPIC CHOLECYSTECTOMY  1980s  . PROSTATE BIOPSY  ~ 1995  . TOTAL HIP ARTHROPLASTY Left 11/22/2015   Procedure: LEFT HIP HEMIARTHROPLASTY ;  Surgeon: Leandrew Koyanagi, MD;  Location: [place];  Service: Orthopedics;  Laterality: Left;       Family History  Problem Relation Age of Onset  . Stroke Sister        in her 72s  . CAD Neg Hx   . COPD Neg Hx   . Diabetes  Neg Hx   . Heart disease Neg Hx     Social History   Tobacco Use  . Smoking status: Former Research scientist (life sciences)  . Smokeless tobacco: Never Used  . Tobacco comment: 11/21/2015 "might have smoked 2 packs of cigarettes in my lifetime"  Substance Use Topics  . Alcohol use: Yes    Comment: 11/21/2015 "last beer I've had was in ~ 2007"  . Drug use: No    Home Medications Prior to Admission medications   Medication Sig Start Date End Date Taking? Authorizing Provider  acetaminophen (TYLENOL) 500 MG tablet Take 1,000 mg by mouth daily as needed for mild pain or moderate pain.   Yes [provider]   amLODipine (NORVASC) 5 MG tablet TAKE (1) TABLET BY MOUTH ONCE DAILY. Patient taking differently: Take 5 mg by mouth every morning.  08/01/19  Yes Luking, Elayne Snare, MD  atorvastatin (LIPITOR) 20 MG tablet TAKE 1 TABLET BY MOUTH ONCE DAILY AT 6:00 PM. Patient taking differently: Take 20 mg by mouth daily at 6 PM.  08/16/19  Yes Luking, Elayne Snare, MD  cetirizine (ZYRTEC) 10 MG tablet Take 10 mg by mouth every evening.    Yes [provider]  docusate sodium (COLACE) 100 MG capsule Take 100 mg by mouth daily as needed for mild constipation or moderate constipation.    Yes [provider]  ELIQUIS 2.5 MG TABS tablet TAKE (1) TABLET BY MOUTH TWICE DAILY 9AM AND 9PM. Patient taking differently: Take 2.5 mg by mouth 2 (two) times daily.  02/09/19  Yes Luking, Elayne Snare, MD  guaiFENesin (MUCINEX) 600 MG 12 hr tablet Take 600 mg by mouth daily.   Yes [provider]  ketoconazole (NIZORAL) 2 % cream Apply 1 application topically 2 (two) times daily.   Yes [provider]  levothyroxine (SYNTHROID) 25 MCG tablet TAKE 1 TABLET EVERY MORNING ON AN EMPTY STOMACH FOR THYROID. Patient taking differently: Take 25 mcg by mouth daily before breakfast.  07/11/19  Yes Luking, Scott A, MD  lidocaine (XYLOCAINE) 2 % jelly Place 1 application into the urethra as needed. Insert into urethra 5 minutes prior  prior to foley insertion 06/23/19  Yes Irine Seal, MD  metoprolol succinate (TOPROL-XL) 25 MG 24 hr tablet TAKE (1) TABLET BY MOUTH ONCE DAILY. Patient taking differently: Take 25 mg by mouth every evening.  02/28/19  Yes Kathyrn Drown, MD  nystatin (MYCOSTATIN/NYSTOP) powder Apply 1 application topically 2 (two) times daily. 07/04/19  Yes Irine Seal, MD  QC NATURA-LAX 17 GM/SCOOP powder MIX 1 CAPFUL IN 8 OZ OF WATER AND DRINK TWICE WEEKLY. Patient taking differently: Take 17 g by mouth daily as needed for mild constipation or moderate constipation.  12/06/18  Yes Luking, Elayne Snare, MD   traMADol (ULTRAM) 50 MG tablet TAKE ONE TABLET 3 TIMES DAILY AS NEEDED FOR SEVERE PAIN. Patient taking differently: Take 50 mg by mouth 3 (three) times daily as needed for moderate pain or severe pain.  02/28/19  Yes Kathyrn Drown, MD  ciprofloxacin (CIPRO) 500 MG tablet Take 1 tablet (500 mg total) by mouth 2 (two) times daily. Patient not taking: Reported on 08/28/2019 08/05/19   Kem Parkinson, PA-C    Allergies    Xanax [alprazolam] and Tape  Review of Systems   Review of Systems  Constitutional: Negative for chills and fever.  HENT: Negative.   Eyes: Negative.   Respiratory: Negative for shortness of breath.   Cardiovascular: Negative for chest pain.  Gastrointestinal:  Positive for constipation and rectal pain. Negative for abdominal pain and nausea.  Genitourinary: Negative.   Musculoskeletal: Negative for arthralgias, joint swelling and neck pain.  Skin: Negative.  Negative for rash and wound.  Neurological: Negative for dizziness, weakness, light-headedness, numbness and headaches.  Psychiatric/Behavioral: Negative.     Physical Exam Updated Vital Signs BP (!) 137/59 (BP Location: Left Arm)   Pulse 78   Temp 98.5 F (36.9 C) (Oral)   Resp 16   SpO2 94%   Physical Exam Vitals and nursing note reviewed. Exam conducted with a chaperone present.  Constitutional:      Appearance: He is well-developed.  HENT:     Head: Normocephalic and atraumatic.  Cardiovascular:     Rate and Rhythm: Normal rate and regular rhythm.     Heart sounds: Normal heart sounds.  Pulmonary:     Effort: Pulmonary effort is normal.     Breath sounds: Normal breath sounds. No wheezing.  Abdominal:     General: Bowel sounds are normal. There is no distension.     Palpations: Abdomen is soft.     Tenderness: There is no abdominal tenderness. There is no guarding.  Genitourinary:    Rectum: Guaiac result negative. No external hemorrhoid.     Comments: Moderate amount of brown stool in the  rectal vault.  It is soft without impaction. Musculoskeletal:        General: Normal range of motion.  Skin:    General: Skin is warm and dry.  Neurological:     Mental Status: He is alert.     ED Results / Procedures / Treatments   Labs (all labs ordered are listed, but only abnormal results are displayed) Labs Reviewed  BASIC METABOLIC PANEL - Abnormal; Notable for the following components:      Result Value   Sodium 133 (*)    CO2 18 (*)    Glucose, Bld 154 (*)    BUN 24 (*)    All other components within normal limits  URINALYSIS, ROUTINE W REFLEX MICROSCOPIC - Abnormal; Notable for the following components:   Hgb urine dipstick MODERATE (*)    Protein, ur 30 (*)    Leukocytes,Ua SMALL (*)    Bacteria, UA RARE (*)    All other components within normal limits  HEPATIC FUNCTION PANEL - Abnormal; Notable for the following components:   Total Bilirubin 1.4 (*)    Bilirubin, Direct 0.3 (*)    Indirect Bilirubin 1.1 (*)    All other components within normal limits  SARS CORONAVIRUS 2 (TAT 6-24 HRS)  LIPASE, BLOOD  POC OCCULT BLOOD, ED  POC OCCULT BLOOD, ED    EKG None  Radiology DG Abdomen 1 View  Result Date: 08/28/2019 CLINICAL DATA:  Constipation. EXAM: ABDOMEN - 1 VIEW COMPARISON:  December 08, 2017 FINDINGS: There is nonspecific gaseous distention of loops of small bowel and colon scattered throughout the abdomen. There is no evidence for pneumatosis. No evidence for free air. There are heavy calcifications of the abdominal aorta and iliac vasculature. The patient is status post total hip arthroplasty on the left. Phleboliths project over the patient's pelvis. IMPRESSION: 1. There is nonspecific gaseous distention of loops of small bowel and colon scattered throughout the abdomen without evidence for high-grade small bowel obstruction. 2. Degenerative changes are noted of the lumbar spine. 3.  Aortic Atherosclerosis (ICD10-I70.0). Electronically Signed   By: Constance Holster M.D.   On: 08/28/2019 22:05   CT ABDOMEN PELVIS  W CONTRAST  Result Date: 08/29/2019 CLINICAL DATA:  Nausea and vomiting.  Constipation. EXAM: CT ABDOMEN AND PELVIS WITH CONTRAST TECHNIQUE: Multidetector CT imaging of the abdomen and pelvis was performed using the standard protocol following bolus administration of intravenous contrast. CONTRAST:  136mL OMNIPAQUE IOHEXOL 300 MG/ML  SOLN COMPARISON:  December 05, 2017 FINDINGS: Lower chest: There is atelectasis at the lung bases. There are trace bilateral pleural effusions.The heart is enlarged. Coronary artery calcifications are noted. Hepatobiliary: There are stable hepatic cysts. Status post cholecystectomy.There is no biliary ductal dilation. Pancreas: Normal contours without ductal dilatation. No peripancreatic fluid collection. Spleen: Unremarkable. Adrenals/Urinary Tract: --Adrenal glands: Unremarkable. --Right kidney/ureter: No hydronephrosis or radiopaque kidney stones. --Left kidney/ureter: Again noted is a complex left renal mass currently measuring approximately 5.6 x 4.8 cm (previously measuring 5.6 x 4.8 cm). There is an additional complex mass arising from the upper pole measuring approximately 4.2 by 3.9 cm (previously measuring approximately 3.2 by 3 cm). --Urinary bladder: The bladder is decompressed with a Foley catheter. Stomach/Bowel: --Stomach/Duodenum: The stomach is distended with gas. --Small bowel: Unremarkable. --Colon: There is rectal wall thickening as well as thickening of the sigmoid colon. There is no evidence for obstruction. The stool burden is average. --Appendix: Normal. Vascular/Lymphatic: Atherosclerotic calcification is present within the non-aneurysmal abdominal aorta, without hemodynamically significant stenosis. --No retroperitoneal lymphadenopathy. --No mesenteric lymphadenopathy. --No pelvic or inguinal lymphadenopathy. Reproductive: Prostate gland is surgically absent. Other: No ascites or free air. There is a fat  containing left inguinal hernia. Musculoskeletal. There is stable height loss of the L2 vertebral body. There is no acute displaced fracture. IMPRESSION: 1. Findings suspicious for proctocolitis in the appropriate clinical setting. 2. Stable dominant left renal mass consistent with renal cell carcinoma until proven otherwise. Slight interval growth of an additional complex mass in the upper pole of the left kidney, also concerning for renal cell carcinoma. 3. There are trace bilateral pleural effusions. There is cardiomegaly. 4. There are multiple additional chronic findings as detailed above. Aortic Atherosclerosis (ICD10-I70.0). Electronically Signed   By: Constance Holster M.D.   On: 08/29/2019 00:21   DG Chest Portable 1 View  Result Date: 08/29/2019 CLINICAL DATA:  Wheezing EXAM: PORTABLE CHEST 1 VIEW COMPARISON:  Chest x-ray dated 03/17/2019. FINDINGS: The cardiac silhouette is significantly enlarged. Aortic calcifications are noted. Kerley B lines are visualized. There is blunting of the costophrenic angles bilaterally consistent with trace bilateral pleural effusions. Scattered subtle hazy airspace opacities are noted most evident in the left peripheral upper lung zone. IMPRESSION: 1. Cardiomegaly with vascular congestion and possible developing pulmonary edema. 2. Trace bilateral pleural effusions. 3. Hazy peripheral airspace opacities in the left upper lung zone may represent pulmonary edema or developing infiltrate. Electronically Signed   By: Constance Holster M.D.   On: 08/29/2019 01:25    Procedures Procedures (including critical care time)  Medications Ordered in ED Medications  ondansetron (ZOFRAN) injection 4 mg (4 mg Intravenous Given 08/28/19 2348)  iohexol (OMNIPAQUE) 300 MG/ML solution 100 mL (100 mLs Intravenous Contrast Given 08/28/19 2357)    ED Course  I have reviewed the triage vital signs and the nursing notes.  Pertinent labs & imaging results that were available during my  care of the patient were reviewed by me and considered in my medical decision making (see chart for details).    MDM Rules/Calculators/A&P                      While discussing  KUB and lab test findings, patient developed several episodes of back-to-back vomiting of a brown liquid vomitus with food particles.  An IV was started and he was given an IV dose of Zofran.  We sent him for CT scan and additional labs were added including hepatic function panel and lipase, UA.  All labs were reviewed and discussed with patient and daughter at the bedside.  They were aware of the renal mass that is being followed.  At reexam patient is now wheezing, predominantly right sided.  There is concern for possible aspiration during this episode of vomiting.  Portable chest x-ray has been ordered.  Pt also had a large BM after returning from CT imaging.   CXR suggesting possible early pneumonia left upper lung, concerning for probable aspiration of vomitus.  Discussed with dg and pt.  Will plan admission.    Discussed with Dr. Darrick Meigs who will arrange admission.  Added bnp to labs. Covid screening ordered.   Final Clinical Impression(s) / ED Diagnoses Final diagnoses:  Aspiration pneumonia of left upper lobe due to gastric secretions (HCC)  Constipation, unspecified constipation type    Rx / DC Orders ED Discharge Orders    None       Landis Martins 08/29/19 Reinaldo Raddle, MD 08/30/19 1151

## 2019-08-29 NOTE — Progress Notes (Signed)
Pharmacy Antibiotic Note  Ethan Faulkner is a 86 y.o. male admitted on 08/28/2019 with aspiration pneumonia.  Pharmacy has been consulted for Unasyn dosing.  Plan: Unasyn 1.5g IV Q6H.  Temp (24hrs), Avg:98.5 F (36.9 C), Min:98.5 F (36.9 C), Max:98.5 F (36.9 C)  Recent Labs  Lab 08/28/19 2141  CREATININE 0.83     Allergies  Allergen Reactions  . Xanax [Alprazolam] Other (See Comments)    Dizzy, Sedated  . Tape Rash    PATIENT'S SKIN WILL TEAR/PLEASE EITHER USE PAPER TAPE OR COBAN WRAP!!     Thank you for allowing pharmacy to be a part of this patient's care.  Wynona Neat, PharmD, BCPS  08/29/2019 3:59 AM

## 2019-08-29 NOTE — H&P (Addendum)
TRH H&P    Patient Demographics:    Ethan Faulkner, is a 86 y.o. male  MRN: [id number]  DOB - [birth year]  Admit Date - 08/28/2019  Referring MD/NP/PA: Evalee Jefferson  Outpatient Primary MD for the patient is Kathyrn Drown, MD  Patient coming from: Home  Chief complaint-constipation   HPI:    Ethan Faulkner  is a 86 y.o. male, with past medical history of atrial fibrillation, CVA, hypertension, renal mass was brought to the ED for evaluation of constipation.  Patient lives at home with children and caregivers providing around-the-clock care.  Patient has not had a BM for past 5 days.  Also complains of rectal pain with straining.  He has been receiving MiraLAX, Colace and suppositories without significant relief at home. Patient had bowel movement in the ED followed by vomiting of black liquid vomitus with food particles.  Patient was given Zofran.  After vomiting patient started coughing and developed wheezing.  There was concern for possible aspiration pneumonia.  Portable chest x-ray confirmed hazy peripheral airspace opacities in the left upper lung zone, may represent pulmonary edema or developing infiltrate. No previous history of chest pain or shortness of breath. No abdominal pain or dysuria.   Review of systems:    In addition to the HPI above,    All other systems reviewed and are negative.    Past History of the following :    Past Medical History:  Diagnosis Date  . Afib ([place])   . Atrial fibrillation ([place])   . Cerebral infarction ([place])   . DDD (degenerative disc disease), lumbar   . Dysphagia   . Hypertension   . Hypothyroidism   . Migraine    "maybe monthly" (11/21/2015)  . Prostate cancer ([place]) ~ 1995   S/P radiation tx  . Renal mass    "slow growing something; doctors just watching it right now" (11/21/2015)  . Stroke [facility])    TIA  . Thrombocytopenia ([place])   . Thyroid disease      hypothyroid  . UTI (urinary tract infection) 03/20/2019      Past Surgical History:  Procedure Laterality Date  . COLONOSCOPY  1999  . ESOPHAGOGASTRODUODENOSCOPY    . LAPAROSCOPIC CHOLECYSTECTOMY  1980s  . PROSTATE BIOPSY  ~ 1995  . TOTAL HIP ARTHROPLASTY Left 11/22/2015   Procedure: LEFT HIP HEMIARTHROPLASTY ;  Surgeon: Leandrew Koyanagi, MD;  Location: [place];  Service: Orthopedics;  Laterality: Left;      Social History:      Social History   Tobacco Use  . Smoking status: Former Research scientist (life sciences)  . Smokeless tobacco: Never Used  . Tobacco comment: 11/21/2015 "might have smoked 2 packs of cigarettes in my lifetime"  Substance Use Topics  . Alcohol use: Yes    Comment: 11/21/2015 "last beer I've had was in ~ 2007"       Family History :     Family History  Problem Relation Age of Onset  . Stroke Sister        in her 77s  .  CAD Neg Hx   . COPD Neg Hx   . Diabetes Neg Hx   . Heart disease Neg Hx       Home Medications:   Prior to Admission medications   Medication Sig Start Date End Date Taking? Authorizing Provider  acetaminophen (TYLENOL) 500 MG tablet Take 1,000 mg by mouth daily as needed for mild pain or moderate pain.   Yes [provider]  amLODipine (NORVASC) 5 MG tablet TAKE (1) TABLET BY MOUTH ONCE DAILY. Patient taking differently: Take 5 mg by mouth every morning.  08/01/19  Yes Luking, Elayne Snare, MD  atorvastatin (LIPITOR) 20 MG tablet TAKE 1 TABLET BY MOUTH ONCE DAILY AT 6:00 PM. Patient taking differently: Take 20 mg by mouth daily at 6 PM.  08/16/19  Yes Luking, Elayne Snare, MD  cetirizine (ZYRTEC) 10 MG tablet Take 10 mg by mouth every evening.    Yes [provider]  docusate sodium (COLACE) 100 MG capsule Take 100 mg by mouth daily as needed for mild constipation or moderate constipation.    Yes [provider]  ELIQUIS 2.5 MG TABS tablet TAKE (1) TABLET BY MOUTH TWICE DAILY 9AM AND 9PM. Patient taking differently: Take 2.5 mg by mouth 2  (two) times daily.  02/09/19  Yes Luking, Elayne Snare, MD  guaiFENesin (MUCINEX) 600 MG 12 hr tablet Take 600 mg by mouth daily.   Yes [provider]  ketoconazole (NIZORAL) 2 % cream Apply 1 application topically 2 (two) times daily.   Yes [provider]  levothyroxine (SYNTHROID) 25 MCG tablet TAKE 1 TABLET EVERY MORNING ON AN EMPTY STOMACH FOR THYROID. Patient taking differently: Take 25 mcg by mouth daily before breakfast.  07/11/19  Yes Luking, Scott A, MD  lidocaine (XYLOCAINE) 2 % jelly Place 1 application into the urethra as needed. Insert into urethra 5 minutes prior  prior to foley insertion 06/23/19  Yes Irine Seal, MD  metoprolol succinate (TOPROL-XL) 25 MG 24 hr tablet TAKE (1) TABLET BY MOUTH ONCE DAILY. Patient taking differently: Take 25 mg by mouth every evening.  02/28/19  Yes Kathyrn Drown, MD  nystatin (MYCOSTATIN/NYSTOP) powder Apply 1 application topically 2 (two) times daily. 07/04/19  Yes Irine Seal, MD  QC NATURA-LAX 17 GM/SCOOP powder MIX 1 CAPFUL IN 8 OZ OF WATER AND DRINK TWICE WEEKLY. Patient taking differently: Take 17 g by mouth daily as needed for mild constipation or moderate constipation.  12/06/18  Yes Luking, Elayne Snare, MD  traMADol (ULTRAM) 50 MG tablet TAKE ONE TABLET 3 TIMES DAILY AS NEEDED FOR SEVERE PAIN. Patient taking differently: Take 50 mg by mouth 3 (three) times daily as needed for moderate pain or severe pain.  02/28/19  Yes Kathyrn Drown, MD  ciprofloxacin (CIPRO) 500 MG tablet Take 1 tablet (500 mg total) by mouth 2 (two) times daily. Patient not taking: Reported on 08/28/2019 08/05/19   Kem Parkinson, PA-C     Allergies:     Allergies  Allergen Reactions  . Xanax [Alprazolam] Other (See Comments)    Dizzy, Sedated  . Tape Rash    PATIENT'S SKIN WILL TEAR/PLEASE EITHER USE PAPER TAPE OR COBAN WRAP!!     Physical Exam:   Vitals  Blood pressure (!) 137/59, pulse 78, temperature 98.5 F (36.9 C), temperature source Oral, resp.  rate 16, SpO2 94 %.  1.  General: Appears lethargic  2. Psychiatric: Somnolent but arousable, patient is nonverbal  3. Neurologic: Patient is nonverbal, cranial nerves II  through XII grossly intact  4. HEENMT:  Atraumatic normocephalic, extraocular muscles are intact  5. Respiratory : Bilateral rhonchi auscultated  6. Cardiovascular : S1-S2, regular, no murmur auscultated  7. Gastrointestinal:  Abdomen is soft, nontender, no organomegaly     Data Review:    CBC No results for input(s): WBC, HGB, HCT, PLT, MCV, MCH, MCHC, RDW, LYMPHSABS, MONOABS, EOSABS, BASOSABS, BANDABS in the last 168 hours.  Invalid input(s): NEUTRABS, BANDSABD ------------------------------------------------------------------------------------------------------------------  Results for orders placed or performed during the hospital encounter of 08/28/19 (from the past 48 hour(s))  POC occult blood, ED     Status: None   Collection Time: 08/28/19  9:09 PM  Result Value Ref Range   Fecal Occult Bld NEGATIVE NEGATIVE  Basic metabolic panel     Status: Abnormal   Collection Time: 08/28/19  9:41 PM  Result Value Ref Range   Sodium 133 (L) 135 - 145 mmol/L   Potassium 4.0 3.5 - 5.1 mmol/L   Chloride 105 98 - 111 mmol/L   CO2 18 (L) 22 - 32 mmol/L   Glucose, Bld 154 (H) 70 - 99 mg/dL    Comment: Glucose reference range applies only to samples taken after fasting for at least 8 hours.   BUN 24 (H) 8 - 23 mg/dL   Creatinine, Ser 0.83 0.61 - 1.24 mg/dL   Calcium 9.1 8.9 - 10.3 mg/dL   GFR calc non Af Amer >60 >60 mL/min   GFR calc Af Amer >60 >60 mL/min   Anion gap 10 5 - 15    Comment: Performed at [facility], [address], [place], [place] [address]  Hepatic function panel     Status: Abnormal   Collection Time: 08/28/19  9:41 PM  Result Value Ref Range   Total Protein 6.8 6.5 - 8.1 g/dL   Albumin 3.9 3.5 - 5.0 g/dL   AST 21 15 - 41 U/L   ALT 17 0 - 44 U/L   Alkaline Phosphatase 62 38 - 126  U/L   Total Bilirubin 1.4 (H) 0.3 - 1.2 mg/dL   Bilirubin, Direct 0.3 (H) 0.0 - 0.2 mg/dL   Indirect Bilirubin 1.1 (H) 0.3 - 0.9 mg/dL    Comment: Performed at [facility], [address], [place], [place] [address]  Lipase, blood     Status: None   Collection Time: 08/28/19  9:41 PM  Result Value Ref Range   Lipase 33 11 - 51 U/L    Comment: Performed at [facility], [address], [place], [place] [address]  Brain natriuretic peptide     Status: Abnormal   Collection Time: 08/28/19  9:41 PM  Result Value Ref Range   B Natriuretic Peptide 333.0 (H) 0.0 - 100.0 pg/mL    Comment: Performed at [facility], [address], [place], [place] [address]  Urinalysis, Routine w reflex microscopic     Status: Abnormal   Collection Time: 08/28/19 10:56 PM  Result Value Ref Range   Color, Urine YELLOW YELLOW   APPearance CLEAR CLEAR   Specific Gravity, Urine 1.017 1.005 - 1.030   pH 5.0 5.0 - 8.0   Glucose, UA NEGATIVE NEGATIVE mg/dL   Hgb urine dipstick MODERATE (A) NEGATIVE   Bilirubin Urine NEGATIVE NEGATIVE   Ketones, ur NEGATIVE NEGATIVE mg/dL   Protein, ur 30 (A) NEGATIVE mg/dL   Nitrite NEGATIVE NEGATIVE   Leukocytes,Ua SMALL (A) NEGATIVE   RBC / HPF 6-10 0 - 5 RBC/hpf   WBC, UA 6-10 0 -  5 WBC/hpf   Bacteria, UA RARE (A) NONE SEEN   Squamous Epithelial / LPF 0-5 0 - 5   Mucus PRESENT     Comment: Performed at [facility], [address], [place],  [address]    Chemistries  Recent Labs  Lab 08/28/19 2141  NA 133*  K 4.0  CL 105  CO2 18*  GLUCOSE 154*  BUN 24*  CREATININE 0.83  CALCIUM 9.1  AST 21  ALT 17  ALKPHOS 62  BILITOT 1.4*   ------------------------------------------------------------------------------------------------------------------  ------------------------------------------------------------------------------------------------------------------ GFR: CrCl cannot be calculated (Unknown ideal weight.). Liver Function Tests: Recent Labs  Lab  08/28/19 2141  AST 21  ALT 17  ALKPHOS 62  BILITOT 1.4*  PROT 6.8  ALBUMIN 3.9   Recent Labs  Lab 08/28/19 2141  LIPASE 33   --------------------------------------------------------------------------------------------------------------- Urine analysis:    Component Value Date/Time   COLORURINE YELLOW 08/28/2019 2256   APPEARANCEUR CLEAR 08/28/2019 2256   LABSPEC 1.017 08/28/2019 2256   PHURINE 5.0 08/28/2019 2256   GLUCOSEU NEGATIVE 08/28/2019 2256   HGBUR MODERATE (A) 08/28/2019 2256   BILIRUBINUR NEGATIVE 08/28/2019 2256   BILIRUBINUR ++ 10/26/2016 1121   KETONESUR NEGATIVE 08/28/2019 2256   PROTEINUR 30 (A) 08/28/2019 2256   UROBILINOGEN 0.2 05/11/2016 1117   NITRITE NEGATIVE 08/28/2019 2256   LEUKOCYTESUR SMALL (A) 08/28/2019 2256      Imaging Results:    DG Abdomen 1 View  Result Date: 08/28/2019 CLINICAL DATA:  Constipation. EXAM: ABDOMEN - 1 VIEW COMPARISON:  December 08, 2017 FINDINGS: There is nonspecific gaseous distention of loops of small bowel and colon scattered throughout the abdomen. There is no evidence for pneumatosis. No evidence for free air. There are heavy calcifications of the abdominal aorta and iliac vasculature. The patient is status post total hip arthroplasty on the left. Phleboliths project over the patient's pelvis. IMPRESSION: 1. There is nonspecific gaseous distention of loops of small bowel and colon scattered throughout the abdomen without evidence for high-grade small bowel obstruction. 2. Degenerative changes are noted of the lumbar spine. 3.  Aortic Atherosclerosis (ICD10-I70.0). Electronically Signed   By: Constance Holster M.D.   On: 08/28/2019 22:05   CT ABDOMEN PELVIS W CONTRAST  Result Date: 08/29/2019 CLINICAL DATA:  Nausea and vomiting.  Constipation. EXAM: CT ABDOMEN AND PELVIS WITH CONTRAST TECHNIQUE: Multidetector CT imaging of the abdomen and pelvis was performed using the standard protocol following bolus administration of  intravenous contrast. CONTRAST:  120mL OMNIPAQUE IOHEXOL 300 MG/ML  SOLN COMPARISON:  December 05, 2017 FINDINGS: Lower chest: There is atelectasis at the lung bases. There are trace bilateral pleural effusions.The heart is enlarged. Coronary artery calcifications are noted. Hepatobiliary: There are stable hepatic cysts. Status post cholecystectomy.There is no biliary ductal dilation. Pancreas: Normal contours without ductal dilatation. No peripancreatic fluid collection. Spleen: Unremarkable. Adrenals/Urinary Tract: --Adrenal glands: Unremarkable. --Right kidney/ureter: No hydronephrosis or radiopaque kidney stones. --Left kidney/ureter: Again noted is a complex left renal mass currently measuring approximately 5.6 x 4.8 cm (previously measuring 5.6 x 4.8 cm). There is an additional complex mass arising from the upper pole measuring approximately 4.2 by 3.9 cm (previously measuring approximately 3.2 by 3 cm). --Urinary bladder: The bladder is decompressed with a Foley catheter. Stomach/Bowel: --Stomach/Duodenum: The stomach is distended with gas. --Small bowel: Unremarkable. --Colon: There is rectal wall thickening as well as thickening of the sigmoid colon. There is no evidence for obstruction. The stool burden is average. --Appendix: Normal. Vascular/Lymphatic: Atherosclerotic calcification is present within the non-aneurysmal  abdominal aorta, without hemodynamically significant stenosis. --No retroperitoneal lymphadenopathy. --No mesenteric lymphadenopathy. --No pelvic or inguinal lymphadenopathy. Reproductive: Prostate gland is surgically absent. Other: No ascites or free air. There is a fat containing left inguinal hernia. Musculoskeletal. There is stable height loss of the L2 vertebral body. There is no acute displaced fracture. IMPRESSION: 1. Findings suspicious for proctocolitis in the appropriate clinical setting. 2. Stable dominant left renal mass consistent with renal cell carcinoma until proven otherwise.  Slight interval growth of an additional complex mass in the upper pole of the left kidney, also concerning for renal cell carcinoma. 3. There are trace bilateral pleural effusions. There is cardiomegaly. 4. There are multiple additional chronic findings as detailed above. Aortic Atherosclerosis (ICD10-I70.0). Electronically Signed   By: Constance Holster M.D.   On: 08/29/2019 00:21   DG Chest Portable 1 View  Result Date: 08/29/2019 CLINICAL DATA:  Wheezing EXAM: PORTABLE CHEST 1 VIEW COMPARISON:  Chest x-ray dated 03/17/2019. FINDINGS: The cardiac silhouette is significantly enlarged. Aortic calcifications are noted. Kerley B lines are visualized. There is blunting of the costophrenic angles bilaterally consistent with trace bilateral pleural effusions. Scattered subtle hazy airspace opacities are noted most evident in the left peripheral upper lung zone. IMPRESSION: 1. Cardiomegaly with vascular congestion and possible developing pulmonary edema. 2. Trace bilateral pleural effusions. 3. Hazy peripheral airspace opacities in the left upper lung zone may represent pulmonary edema or developing infiltrate. Electronically Signed   By: Constance Holster M.D.   On: 08/29/2019 01:25       Assessment & Plan:    Active Problems:   Aspiration pneumonia ([place])   1. Aspiration pneumonia-patient vomited in the ED and there was concern for possible aspiration.  Chest x-ray confirmed is a peripheral airspace opacity in the left upper lung zone, developing infiltrate.  We will keep NPO.  Obtain swallow evaluation in a.m.  Will start patient on Unasyn per pharmacy consultation.  Follow blood culture results. 2. Constipation-patient presented with constipation for past 5 days.  Resolved.  Patient had 2 large BMs in the ED. 3. History of atrial fibrillation-continue anticoagulation Eliquis, heart rate is controlled.  Continue metoprolol 25 mg daily for rate control. 4. Hypothyroidism-continue  Synthroid. 5. Hypertension-continue Norvasc, metoprolol. 6. History of stroke-patient is nonverbal at baseline.   DVT Prophylaxis-  Eliquis  AM Labs Ordered, also please review Full Orders  Family Communication: Admission, patients condition and plan of care including tests being ordered have been discussed with the patient's daughter at bedside who indicate understanding and agree with the plan and Code Status.  Code Status: DNR  Admission status: Observation/Inpatient :The appropriate admission status for this patient is INPATIENT. Inpatient status is judged to be reasonable and necessary in order to provide the required intensity of service to ensure the patient's safety. The patient's presenting symptoms, physical exam findings, and initial radiographic and laboratory data in the context of their chronic comorbidities is felt to place them at high risk for further clinical deterioration. Furthermore, it is not anticipated that the patient will be medically stable for discharge from the hospital within 2 midnights of admission. The following factors support the admission status of inpatient.     The patient's presenting symptoms include constipation. The worrisome physical exam findings include pneumonia. The initial radiographic and laboratory data are worrisome because of pneumonia. The chronic co-morbidities include atrial fibrillation, stroke, hypertension       * I certify that at the point of admission it is my clinical judgment  that the patient will require inpatient hospital care spanning beyond 2 midnights from the point of admission due to high intensity of service, high risk for further deterioration and high frequency of surveillance required.*  Time spent in minutes : 60 minutes   Loreta Blouch S Huda Petrey M.D

## 2019-08-30 MED ORDER — AMOXICILLIN-POT CLAVULANATE 250-62.5 MG/5ML PO SUSR: 500.0000 mg | Freq: Two times a day (BID) | ORAL | 0 refills | Status: AC

## 2019-08-30 MED ORDER — DOCUSATE SODIUM 100 MG PO CAPS: 100.0000 mg | ORAL_CAPSULE | Freq: Two times a day (BID) | ORAL | 0 refills | Status: AC

## 2019-08-30 MED ORDER — CHLORHEXIDINE GLUCONATE CLOTH 2 % EX PADS
6.0000 | MEDICATED_PAD | Freq: Every day | CUTANEOUS | Status: DC
  Administered 2019-08-30: 6 via TOPICAL

## 2019-08-30 NOTE — Discharge Summary (Signed)
Physician Discharge Summary  Ethan Faulkner [id number] DOB: [birth year] DOA: 08/28/2019  PCP: Kathyrn Drown, MD  Admit date: 08/28/2019  Discharge date: 08/30/2019  Admitted From:Home  Disposition:  Home  Recommendations for Outpatient Follow-up:  1. Follow up with PCP in 1-2 weeks 2. Continue on Augmentin as prescribed for 5 more days to complete total 7-day course of treatment for aspiration pneumonia 3. Colace changed to twice daily instead of as needed to help with bowel movements  Home Health: Yes with PT  Equipment/Devices: Has home walker  Discharge Condition: Stable  CODE STATUS: DNR  Diet recommendation: Heart Healthy, dysphagia 1  Brief/Interim Summary: Per HPI: Ethan Faulkner  is a 86 y.o. male, with past medical history of atrial fibrillation, CVA, hypertension, renal mass was brought to the ED for evaluation of constipation.  Patient lives at home with children and caregivers providing around-the-clock care.  Patient has not had a BM for past 5 days.  Also complains of rectal pain with straining.  He has been receiving MiraLAX, Colace and suppositories without significant relief at home. Patient had bowel movement in the ED followed by vomiting of black liquid vomitus with food particles.  Patient was given Zofran.  After vomiting patient started coughing and developed wheezing.  There was concern for possible aspiration pneumonia.  Portable chest x-ray confirmed hazy peripheral airspace opacities in the left upper lung zone, may represent pulmonary edema or developing infiltrate. No previous history of chest pain or shortness of breath. No abdominal pain or dysuria.  4/7: Patient was noted to have trouble with constipation, but immediately had bowel movements in the ED.  Unfortunately, he had vomiting with aspiration noted on chest x-ray for which she was started on IV Unasyn.  He has had no fevers or other complications noted.  He has been seen by SLP with  recommendations for dysphagia 1 diet.  He has caretakers at home 24/7 to assist him with feedings and family members feel comfortable taking him back home and would not like for him to go to any facility.  They have requested home health physical therapy which has been arranged.  I will prescribe liquid Augmentin to continue for 5 more days for coverage of his aspiration pneumonia-to complete a full 7-day course of treatment.  No other acute events noted throughout the course of this hospitalization.  Discharge Diagnoses:  Active Problems:   Aspiration pneumonia (HCC)   Constipation  Principal discharge diagnosis: Aspiration pneumonia.  Constipation-resolved.  Discharge Instructions  Discharge Instructions    Diet - low sodium heart healthy   Complete by: As directed    Increase activity slowly   Complete by: As directed      Allergies as of 08/30/2019      Reactions   Xanax [alprazolam] Other (See Comments)   Dizzy, Sedated   Tape Rash   PATIENT'S SKIN WILL TEAR/PLEASE EITHER USE PAPER TAPE OR COBAN WRAP!!      Medication List    STOP taking these medications   ciprofloxacin 500 MG tablet Commonly known as: CIPRO     TAKE these medications   acetaminophen 500 MG tablet Commonly known as: TYLENOL Take 1,000 mg by mouth daily as needed for mild pain or moderate pain.   amLODipine 5 MG tablet Commonly known as: NORVASC TAKE (1) TABLET BY MOUTH ONCE DAILY. What changed: See the new instructions.   amoxicillin-clavulanate 250-62.5 MG/5ML suspension Commonly known as: AUGMENTIN Take 10 mLs (500 mg total) by mouth 2 (two)  times daily for 5 days.   atorvastatin 20 MG tablet Commonly known as: LIPITOR TAKE 1 TABLET BY MOUTH ONCE DAILY AT 6:00 PM. What changed: See the new instructions.   cetirizine 10 MG tablet Commonly known as: ZYRTEC Take 10 mg by mouth every evening.   docusate sodium 100 MG capsule Commonly known as: COLACE Take 1 capsule (100 mg total) by mouth 2  (two) times daily. What changed:   when to take this  reasons to take this   Eliquis 2.5 MG Tabs tablet Generic drug: apixaban TAKE (1) TABLET BY MOUTH TWICE DAILY 9AM AND 9PM. What changed: See the new instructions.   guaiFENesin 600 MG 12 hr tablet Commonly known as: MUCINEX Take 600 mg by mouth daily.   ketoconazole 2 % cream Commonly known as: NIZORAL Apply 1 application topically 2 (two) times daily.   levothyroxine 25 MCG tablet Commonly known as: SYNTHROID TAKE 1 TABLET EVERY MORNING ON AN EMPTY STOMACH FOR THYROID. What changed: See the new instructions.   lidocaine 2 % jelly Commonly known as: XYLOCAINE Place 1 application into the urethra as needed. Insert into urethra 5 minutes prior  prior to foley insertion   metoprolol succinate 25 MG 24 hr tablet Commonly known as: TOPROL-XL TAKE (1) TABLET BY MOUTH ONCE DAILY. What changed: See the new instructions.   nystatin powder Commonly known as: MYCOSTATIN/NYSTOP Apply 1 application topically 2 (two) times daily.   QC Natura-LAX 17 GM/SCOOP powder Generic drug: polyethylene glycol powder MIX 1 CAPFUL IN 8 OZ OF WATER AND DRINK TWICE WEEKLY. What changed: See the new instructions.   traMADol 50 MG tablet Commonly known as: ULTRAM TAKE ONE TABLET 3 TIMES DAILY AS NEEDED FOR SEVERE PAIN. What changed: See the new instructions.      Follow-up Information    Kathyrn Drown, MD Follow up in 1 week(s).   Specialty: Family Medicine Contact information: [place] [address] [phone]          Allergies  Allergen Reactions  . Xanax [Alprazolam] Other (See Comments)    Dizzy, Sedated  . Tape Rash    PATIENT'S SKIN WILL TEAR/PLEASE EITHER USE PAPER TAPE OR COBAN WRAP!!    Consultations:  None   Procedures/Studies: DG Abdomen 1 View  Result Date: 08/28/2019 CLINICAL DATA:  Constipation. EXAM: ABDOMEN - 1 VIEW COMPARISON:  December 08, 2017 FINDINGS: There is nonspecific  gaseous distention of loops of small bowel and colon scattered throughout the abdomen. There is no evidence for pneumatosis. No evidence for free air. There are heavy calcifications of the abdominal aorta and iliac vasculature. The patient is status post total hip arthroplasty on the left. Phleboliths project over the patient's pelvis. IMPRESSION: 1. There is nonspecific gaseous distention of loops of small bowel and colon scattered throughout the abdomen without evidence for high-grade small bowel obstruction. 2. Degenerative changes are noted of the lumbar spine. 3.  Aortic Atherosclerosis (ICD10-I70.0). Electronically Signed   By: Constance Holster M.D.   On: 08/28/2019 22:05   CT ABDOMEN PELVIS W CONTRAST  Result Date: 08/29/2019 CLINICAL DATA:  Nausea and vomiting.  Constipation. EXAM: CT ABDOMEN AND PELVIS WITH CONTRAST TECHNIQUE: Multidetector CT imaging of the abdomen and pelvis was performed using the standard protocol following bolus administration of intravenous contrast. CONTRAST:  157mL OMNIPAQUE IOHEXOL 300 MG/ML  SOLN COMPARISON:  December 05, 2017 FINDINGS: Lower chest: There is atelectasis at the lung bases. There are trace bilateral pleural effusions.The heart is  enlarged. Coronary artery calcifications are noted. Hepatobiliary: There are stable hepatic cysts. Status post cholecystectomy.There is no biliary ductal dilation. Pancreas: Normal contours without ductal dilatation. No peripancreatic fluid collection. Spleen: Unremarkable. Adrenals/Urinary Tract: --Adrenal glands: Unremarkable. --Right kidney/ureter: No hydronephrosis or radiopaque kidney stones. --Left kidney/ureter: Again noted is a complex left renal mass currently measuring approximately 5.6 x 4.8 cm (previously measuring 5.6 x 4.8 cm). There is an additional complex mass arising from the upper pole measuring approximately 4.2 by 3.9 cm (previously measuring approximately 3.2 by 3 cm). --Urinary bladder: The bladder is decompressed  with a Foley catheter. Stomach/Bowel: --Stomach/Duodenum: The stomach is distended with gas. --Small bowel: Unremarkable. --Colon: There is rectal wall thickening as well as thickening of the sigmoid colon. There is no evidence for obstruction. The stool burden is average. --Appendix: Normal. Vascular/Lymphatic: Atherosclerotic calcification is present within the non-aneurysmal abdominal aorta, without hemodynamically significant stenosis. --No retroperitoneal lymphadenopathy. --No mesenteric lymphadenopathy. --No pelvic or inguinal lymphadenopathy. Reproductive: Prostate gland is surgically absent. Other: No ascites or free air. There is a fat containing left inguinal hernia. Musculoskeletal. There is stable height loss of the L2 vertebral body. There is no acute displaced fracture. IMPRESSION: 1. Findings suspicious for proctocolitis in the appropriate clinical setting. 2. Stable dominant left renal mass consistent with renal cell carcinoma until proven otherwise. Slight interval growth of an additional complex mass in the upper pole of the left kidney, also concerning for renal cell carcinoma. 3. There are trace bilateral pleural effusions. There is cardiomegaly. 4. There are multiple additional chronic findings as detailed above. Aortic Atherosclerosis (ICD10-I70.0). Electronically Signed   By: Constance Holster M.D.   On: 08/29/2019 00:21   DG Chest Portable 1 View  Result Date: 08/29/2019 CLINICAL DATA:  Wheezing EXAM: PORTABLE CHEST 1 VIEW COMPARISON:  Chest x-ray dated 03/17/2019. FINDINGS: The cardiac silhouette is significantly enlarged. Aortic calcifications are noted. Kerley B lines are visualized. There is blunting of the costophrenic angles bilaterally consistent with trace bilateral pleural effusions. Scattered subtle hazy airspace opacities are noted most evident in the left peripheral upper lung zone. IMPRESSION: 1. Cardiomegaly with vascular congestion and possible developing pulmonary edema.  2. Trace bilateral pleural effusions. 3. Hazy peripheral airspace opacities in the left upper lung zone may represent pulmonary edema or developing infiltrate. Electronically Signed   By: Constance Holster M.D.   On: 08/29/2019 01:25     Discharge Exam: Vitals:   08/30/19 0412 08/30/19 0819  BP: 117/61   Pulse: 77   Resp: 16   Temp: 98.8 F (37.1 C)   SpO2: 95% 91%   Vitals:   08/29/19 2117 08/29/19 2129 08/30/19 0412 08/30/19 0819  BP: (!) 97/50 (!) 105/56 117/61   Pulse: 79 72 77   Resp: 18  16   Temp: 99.1 F (37.3 C)  98.8 F (37.1 C)   TempSrc: Oral  Oral   SpO2: 98% 97% 95% 91%  Weight:      Height:        General: Pt is alert, awake, not in acute distress Cardiovascular: RRR, S1/S2 +, no rubs, no gallops Respiratory: CTA bilaterally, no wheezing, no rhonchi Abdominal: Soft, NT, ND, bowel sounds + Extremities: no edema, no cyanosis    The results of significant diagnostics from this hospitalization (including imaging, microbiology, ancillary and laboratory) are listed below for reference.     Microbiology: Recent Results (from the past 240 hour(s))  SARS CORONAVIRUS 2 (TAT 6-24 HRS) Nasopharyngeal Nasopharyngeal Swab  Status: None   Collection Time: 08/29/19  1:48 AM   Specimen: Nasopharyngeal Swab  Result Value Ref Range Status   SARS Coronavirus 2 NEGATIVE NEGATIVE Final    Comment: (NOTE) SARS-CoV-2 target nucleic acids are NOT DETECTED. The SARS-CoV-2 RNA is generally detectable in upper and lower respiratory specimens during the acute phase of infection. Negative results do not preclude SARS-CoV-2 infection, do not rule out co-infections with other pathogens, and should not be used as the sole basis for treatment or other patient management decisions. Negative results must be combined with clinical observations, patient history, and epidemiological information. The expected result is Negative. Fact Sheet for  Patients: SugarRoll.be Fact Sheet for Healthcare Providers: https://www.woods-mathews.com/ This test is not yet approved or cleared by the [place] FDA and  has been authorized for detection and/or diagnosis of SARS-CoV-2 by FDA under an Emergency Use Authorization (EUA). This EUA will remain  in effect (meaning this test can be used) for the duration of the COVID-19 declaration under Section 56 4(b)(1) of the Act, 21 U.S.C. section 360bbb-3(b)(1), unless the authorization is terminated or revoked sooner. Performed at [facility], [place] [address], [place], [place] [address]   Culture, blood (Routine X 2) w Reflex to ID Panel     Status: None (Preliminary result)   Collection Time: 08/29/19  5:50 AM   Specimen: BLOOD LEFT HAND  Result Value Ref Range Status   Specimen Description BLOOD LEFT HAND  Final   Special Requests   Final    BOTTLES DRAWN AEROBIC AND ANAEROBIC Blood Culture adequate volume   Culture   Final    NO GROWTH 1 DAY Performed at [facility], [address] [place], [place] [address]    Report Status PENDING  Incomplete  Culture, blood (Routine X 2) w Reflex to ID Panel     Status: None (Preliminary result)   Collection Time: 08/29/19  5:56 AM   Specimen: BLOOD RIGHT HAND  Result Value Ref Range Status   Specimen Description BLOOD RIGHT HAND  Final   Special Requests   Final    Blood Culture adequate volume BOTTLES DRAWN AEROBIC AND ANAEROBIC   Culture   Final    NO GROWTH 1 DAY Performed at [facility], [address] [place], [place] [address]    Report Status PENDING  Incomplete     Labs: BNP (last 3 results) Recent Labs    08/28/19 2141  BNP [id number]   Basic Metabolic Panel: Recent Labs  Lab 08/28/19 2141 08/29/19 0550  NA 133* 134*  K 4.0 4.5  CL 105 105  CO2 18* 21*  GLUCOSE 154* 131*  BUN 24* 24*  CREATININE 0.83 0.83  CALCIUM 9.1 9.1   Liver Function Tests: Recent Labs  Lab  08/28/19 2141 08/29/19 0550  AST 21 23  ALT 17 19  ALKPHOS 62 56  BILITOT 1.4* 1.6*  PROT 6.8 6.3*  ALBUMIN 3.9 3.6   Recent Labs  Lab 08/28/19 2141  LIPASE 33   No results for input(s): AMMONIA in the last 168 hours. CBC: Recent Labs  Lab 08/29/19 0550  WBC 8.1  HGB 11.9*  HCT 36.8*  MCV 96.6  PLT 149*   Cardiac Enzymes: No results for input(s): CKTOTAL, CKMB, CKMBINDEX, TROPONINI in the last 168 hours. BNP: Invalid input(s): POCBNP CBG: No results for input(s): GLUCAP in the last 168 hours. D-Dimer No results for input(s): DDIMER in the last 72 hours. Hgb A1c No results for input(s): HGBA1C  in the last 72 hours. Lipid Profile No results for input(s): CHOL, HDL, LDLCALC, TRIG, CHOLHDL, LDLDIRECT in the last 72 hours. Thyroid function studies No results for input(s): TSH, T4TOTAL, T3FREE, THYROIDAB in the last 72 hours.  Invalid input(s): FREET3 Anemia work up No results for input(s): VITAMINB12, FOLATE, FERRITIN, TIBC, IRON, RETICCTPCT in the last 72 hours. Urinalysis    Component Value Date/Time   COLORURINE YELLOW 08/28/2019 2256   APPEARANCEUR CLEAR 08/28/2019 2256   LABSPEC 1.017 08/28/2019 2256   PHURINE 5.0 08/28/2019 2256   GLUCOSEU NEGATIVE 08/28/2019 2256   HGBUR MODERATE (A) 08/28/2019 2256   BILIRUBINUR NEGATIVE 08/28/2019 2256   BILIRUBINUR ++ 10/26/2016 1121   KETONESUR NEGATIVE 08/28/2019 2256   PROTEINUR 30 (A) 08/28/2019 2256   UROBILINOGEN 0.2 05/11/2016 1117   NITRITE NEGATIVE 08/28/2019 2256   LEUKOCYTESUR SMALL (A) 08/28/2019 2256   Sepsis Labs Invalid input(s): PROCALCITONIN,  WBC,  LACTICIDVEN Microbiology Recent Results (from the past 240 hour(s))  SARS CORONAVIRUS 2 (TAT 6-24 HRS) Nasopharyngeal Nasopharyngeal Swab     Status: None   Collection Time: 08/29/19  1:48 AM   Specimen: Nasopharyngeal Swab  Result Value Ref Range Status   SARS Coronavirus 2 NEGATIVE NEGATIVE Final    Comment: (NOTE) SARS-CoV-2 target nucleic  acids are NOT DETECTED. The SARS-CoV-2 RNA is generally detectable in upper and lower respiratory specimens during the acute phase of infection. Negative results do not preclude SARS-CoV-2 infection, do not rule out co-infections with other pathogens, and should not be used as the sole basis for treatment or other patient management decisions. Negative results must be combined with clinical observations, patient history, and epidemiological information. The expected result is Negative. Fact Sheet for Patients: SugarRoll.be Fact Sheet for Healthcare Providers: https://www.woods-mathews.com/ This test is not yet approved or cleared by the [place] FDA and  has been authorized for detection and/or diagnosis of SARS-CoV-2 by FDA under an Emergency Use Authorization (EUA). This EUA will remain  in effect (meaning this test can be used) for the duration of the COVID-19 declaration under Section 56 4(b)(1) of the Act, 21 U.S.C. section 360bbb-3(b)(1), unless the authorization is terminated or revoked sooner. Performed at [facility], [place] [address], [place], [place] [address]   Culture, blood (Routine X 2) w Reflex to ID Panel     Status: None (Preliminary result)   Collection Time: 08/29/19  5:50 AM   Specimen: BLOOD LEFT HAND  Result Value Ref Range Status   Specimen Description BLOOD LEFT HAND  Final   Special Requests   Final    BOTTLES DRAWN AEROBIC AND ANAEROBIC Blood Culture adequate volume   Culture   Final    NO GROWTH 1 DAY Performed at [facility], [address], [place], [place] [address]    Report Status PENDING  Incomplete  Culture, blood (Routine X 2) w Reflex to ID Panel     Status: None (Preliminary result)   Collection Time: 08/29/19  5:56 AM   Specimen: BLOOD RIGHT HAND  Result Value Ref Range Status   Specimen Description BLOOD RIGHT HAND  Final   Special Requests   Final    Blood Culture adequate volume  BOTTLES DRAWN AEROBIC AND ANAEROBIC   Culture   Final    NO GROWTH 1 DAY Performed at [facility], [address], [place],  [address]    Report Status PENDING  Incomplete     Time coordinating discharge: 35 minutes  SIGNED:   Irlene Crudup D  Manuella Ghazi, DO [facility] 08/30/2019, 10:11 AM  If 7PM-7AM, please contact night-coverage [email or web address]

## 2019-08-30 NOTE — TOC Transition Note (Signed)
Transition of Care [facility]) - CM/SW Discharge Note   Patient Details  Name: Ethan Faulkner MRN: [id number] Date of Birth: 07-07-1915  Transition of Care [facility]) CM/SW Contact:  Shade Flood, LCSW Phone Number: 08/30/2019, 2:27 PM   Clinical Narrative:     Pt stable for dc per MD. Damaris Schooner with pt's daughter who states that pt lives in his own home and they have private duty caregivers during the day. Adult children alternate staying with pt at night on rotating basis. Daughter confirms that plan is for return to home at dc. PT recommending HH PT. Daughter agreeable and states that they have had [facility] in the past and they would like them again.   Referral sent to [facility] at [place]. There are no other TOC needs identified for dc.  Expected Discharge Plan: [place] Barriers to Discharge: Barriers Resolved   Patient Goals and CMS Choice   CMS Medicare.gov Compare Post Acute Care list provided to:: Patient Represenative (must comment) Choice offered to / list presented to : Adult Children  Expected Discharge Plan and Services Expected Discharge Plan: [place] In-house Referral: Clinical Social Work   Post Acute Care Choice: [place] arrangements for the past 2 months: [place] Expected Discharge Date: 08/30/19                         HH Arranged: PT Deport: [place] ([place]) Date [place]: 08/30/19   Representative spoke with at [place]: Vaughan Basta  Prior Living Arrangements/Services Living arrangements for the past 2 months: [place] with:: Self Patient language and need for interpreter reviewed:: Yes Do you feel safe going back to the place where you live?: Yes      Need for Family Participation in Patient Care: Yes (Comment) Care giver support system in place?: Yes (comment) Current home services: Homehealth aide Criminal Activity/Legal Involvement Pertinent to Current  Situation/Hospitalization: No - Comment as needed  Activities of Daily Living Home Assistive Devices/Equipment: Environmental consultant (specify type), Wheelchair, Transfer belt ADL Screening (condition at time of admission) Patient's cognitive ability adequate to safely complete daily activities?: No Is the patient deaf or have difficulty hearing?: Yes Does the patient have difficulty seeing, even when wearing glasses/contacts?: Yes Does the patient have difficulty concentrating, remembering, or making decisions?: Yes Patient able to express need for assistance with ADLs?: Yes Does the patient have difficulty dressing or bathing?: Yes Independently performs ADLs?: No Communication: Independent Dressing (OT): Dependent Is this a change from baseline?: Pre-admission baseline Grooming: Dependent Is this a change from baseline?: Pre-admission baseline Feeding: Dependent Is this a change from baseline?: Pre-admission baseline Bathing: Dependent Is this a change from baseline?: Pre-admission baseline Toileting: Dependent Is this a change from baseline?: Pre-admission baseline In/Out Bed: Dependent Is this a change from baseline?: Pre-admission baseline Walks in Home: Dependent Is this a change from baseline?: Pre-admission baseline Does the patient have difficulty walking or climbing stairs?: Yes Weakness of Legs: Both Weakness of Arms/Hands: Both  Permission Sought/Granted Permission sought to share information with : Chartered certified accountant granted to share information with : Yes, Verbal Permission Granted     Permission granted to share info w AGENCY: [facility]        Emotional Assessment       Orientation: : Oriented to Self, Oriented to Place, Oriented to Situation, Oriented to  Time Alcohol / Substance Use: Not Applicable Psych  Involvement: No (comment)  Admission diagnosis:  Wheezing [R06.2] Aspiration pneumonia ([place]) [J69.0] Constipation, unspecified constipation  type [K59.00] Patient Active Problem List   Diagnosis Date Noted  . Aspiration pneumonia ([place]) 08/29/2019  . Constipation   . Urinary retention 06/23/2019  . Presence of indwelling urinary catheter 06/23/2019  . Frequent UTI 04/04/2019  . Left hemiparesis ([place]) 03/18/2019  . Acute cystitis with hematuria   . Sepsis ([place]) 05/06/2018  . Hyperbilirubinemia 05/06/2018  . Chronic anemia 05/06/2018  . Hyponatremia 05/06/2018  . Physical deconditioning 05/06/2018  . HTN (hypertension) 05/06/2018  . Hypokalemia 12/08/2017  . Nausea and vomiting   . Ileus ([place])   . Urinary tract infection associated with indwelling urethral catheter ([place])   . Small bowel obstruction ([place]) 12/05/2017  . Acute lower UTI 12/05/2017  . Hyperlipidemia 11/09/2017  . Gross hematuria 04/06/2017  . History of stroke 04/06/2017  . Hematuria, unspecified 02/19/2017  . Cerebrovascular accident (CVA) due to embolism of left middle cerebral artery ([place]) 02/14/2017  . 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 Leukopenia   . Thrombocytopenia ([place])   . Fall   . Acute blood loss anemia   . Femoral neck fracture ([place]) 11/20/2015  . Renal malignant tumor ([place]) 06/06/2015  . Long term current use of anticoagulant therapy 04/29/2015  . Hypothyroidism 03/01/2014  . Atrial fibrillation ([place]) 06/09/2013  . STRESS FRACTURE, FOOT 12/12/2008  . CLOSED FRACTURE OF METATARSAL BONE 12/12/2008   PCP:  Kathyrn Drown, MD Pharmacy:   [place], [id number] [place] [id number] Phone: [phone] Fax: [place], [address] AT [place]. [place] [address] Phone: [phone] Fax: [phone]     Social Determinants of Health (SDOH) Interventions    Readmission Risk Interventions Readmission Risk Prevention Plan 08/30/2019   Transportation Screening Complete  HRI or [place] Complete  Social Work Consult for [place] Planning/Counseling Complete  Palliative Care Screening Not Applicable  Medication Review Press photographer) Complete  Some recent data might be hidden      Final next level of care: Home w Home Health Services Barriers to Discharge: Barriers Resolved   Patient Goals and CMS Choice   CMS Medicare.gov Compare Post Acute Care list provided to:: Patient Represenative (must comment) Choice offered to / list presented to : Adult Children  Discharge Placement                       Discharge Plan and Services In-house Referral: Clinical Social Work   Post Acute Care Choice: Home Health                    HH Arranged: PT [facility] Agency: [place] ([facility]) Date [place]: 08/30/19   Representative spoke with at [place]: [place] ([place]) Interventions     Readmission Risk Interventions Readmission Risk Prevention Plan 08/30/2019  Transportation Screening Complete  HRI or [place] Complete  Social Work Consult for [place] Planning/Counseling Complete  Palliative Care Screening Not Applicable  Medication Review Press photographer) Complete  Some recent data might be hidden

## 2019-08-30 NOTE — Evaluation (Signed)
Physical Therapy Evaluation Patient Details Name: Ethan Faulkner MRN: [id number] DOB: [birth year] Today's Date: 08/30/2019   History of Present Illness  Ethan Faulkner  is a 86 y.o. male, with past medical history of atrial fibrillation, CVA, hypertension, renal mass was brought to the ED for evaluation of constipation.  Patient lives at home with children and caregivers providing around-the-clock care.  Patient has not had a BM for past 5 days.  Also complains of rectal pain with straining.  He has been receiving MiraLAX, Colace and suppositories without significant relief at home.Patient had bowel movement in the ED followed by vomiting of black liquid vomitus with food particles.  Patient was given Zofran.  After vomiting patient started coughing and developed wheezing.  There was concern for possible aspiration pneumonia.  Portable chest x-ray confirmed hazy peripheral airspace opacities in the left upper lung zone, may represent pulmonary edema or developing infiltrate.No previous history of chest pain or shortness of breath.No abdominal pain or dysuria.    Clinical Impression  Patient functioning near baseline for functional mobility and gait, able to ambulate into hallway with fair/good balance using RW, once fatigued tends to slow cadence with labored movement requiring increased assistance to make it back to room.  Patient tolerated sitting up in chair with family member presents after therapy.  Plan:  Patient to be discharged home today and discharged from physical therapy to care of nursing for ambulation daily as tolerated for length of stay.       Follow Up Recommendations Home health PT;Supervision for mobility/OOB;Supervision - Intermittent    Equipment Recommendations  None recommended by PT    Recommendations for Other Services       Precautions / Restrictions Precautions Precautions: Fall Restrictions Weight Bearing Restrictions: No      Mobility  Bed Mobility Overal  bed mobility: Needs Assistance Bed Mobility: Supine to Sit     Supine to sit: Min guard;Min assist     General bed mobility comments: increased time, labored movement  Transfers Overall transfer level: Needs assistance Equipment used: Rolling walker (2 wheeled) Transfers: Sit to/from [facility] Sit to Stand: Min assist Stand pivot transfers: Min assist;Mod assist       General transfer comment: slow labored movement  Ambulation/Gait Ambulation/Gait assistance: Min assist;Mod assist Gait Distance (Feet): 50 Feet Assistive device: Rolling walker (2 wheeled) Gait Pattern/deviations: Decreased step length - right;Decreased step length - left;Decreased stride length Gait velocity: near normal until fatigued, then decreased   General Gait Details: slightly labored cadence, gives poor feed back for when fatigued, tends to slow cadence with decreased balance once fatigued  Stairs            Wheelchair Mobility    Modified Rankin (Stroke Patients Only)       Balance Overall balance assessment: Needs assistance Sitting-balance support: Feet supported;No upper extremity supported Sitting balance-Leahy Scale: Fair Sitting balance - Comments: fair/good seated at EOB   Standing balance support: During functional activity;Bilateral upper extremity supported Standing balance-Leahy Scale: Fair Standing balance comment: using RW                             Pertinent Vitals/Pain Pain Assessment: Faces Faces Pain Scale: No hurt    Home Living Family/patient expects to be discharged to:: Private residence Living Arrangements: Children Available Help at Discharge: Personal care attendant;Available 24 hours/day;Family Type of Home: House Home Access: Stairs to enter Entrance Stairs-Rails: None Entrance [employer]  of Steps: 1 Home Layout: One level Home Equipment: [place] - 2 wheels;Shower seat;Toilet riser;Wheelchair - manual;Hospital  bed;Bedside commode      Prior Function Level of Independence: Needs assistance   Gait / Transfers Assistance Needed: requires assist from caregiver to walk with RW household distances  ADL's / Homemaking Assistance Needed: Given bed baths, requires assist from aides for dressing; able to feed self  Comments: Pt lives in his own house, son/daughter will come at night to stay with him. Pt has aides that stay with him and provide assist during the day     Hand Dominance   Dominant Hand: Right    Extremity/Trunk Assessment   Upper Extremity Assessment Upper Extremity Assessment: Overall WFL for tasks assessed    Lower Extremity Assessment Lower Extremity Assessment: Generalized weakness    Cervical / Trunk Assessment Cervical / Trunk Assessment: Kyphotic  Communication   Communication: Receptive difficulties;Expressive difficulties;HOH  Cognition Arousal/Alertness: Awake/alert Behavior During Therapy: WFL for tasks assessed/performed Overall Cognitive Status: Within Functional Limits for tasks assessed                                 General Comments: Patient follows directions consistently, but speech incomprehensible      General Comments      Exercises     Assessment/Plan    PT Assessment All further PT needs can be met in the next venue of care  PT Problem List Decreased strength;Decreased activity tolerance;Decreased balance;Decreased mobility       PT Treatment Interventions      PT Goals (Current goals can be found in the Care Plan section)  Acute Rehab PT Goals Patient Stated Goal: return home with family/home aides to assist PT Goal Formulation: With patient/family Time For Goal Achievement: 08/30/19 Potential to Achieve Goals: Good    Frequency     Barriers to discharge        Co-evaluation               AM-PAC PT "6 Clicks" Mobility  Outcome Measure Help needed turning from your back to your side while in a flat bed  without using bedrails?: A Little Help needed moving from lying on your back to sitting on the side of a flat bed without using bedrails?: A Little Help needed moving to and from a bed to a chair (including a wheelchair)?: A Little Help needed standing up from a chair using your arms (e.g., wheelchair or bedside chair)?: A Little Help needed to walk in hospital room?: A Lot Help needed climbing 3-5 steps with a railing? : A Lot 6 Click Score: 16    End of Session Equipment Utilized During Treatment: Gait belt Activity Tolerance: Patient tolerated treatment well;Patient limited by fatigue;Patient limited by lethargy Patient left: in chair;with call bell/phone within reach;with family/visitor present Nurse Communication: Mobility status PT Visit Diagnosis: Unsteadiness on feet (R26.81);Other abnormalities of gait and mobility (R26.89);Muscle weakness (generalized) (M62.81)    Time: 2446-9507 PT Time Calculation (min) (ACUTE ONLY): 24 min   Charges:   PT Evaluation $PT Eval Moderate Complexity: 1 Mod PT Treatments $Therapeutic Activity: 23-37 mins        10:38 AM, 08/30/19 Lonell Grandchild, MPT Physical Therapist with [facility] [phone] office [phone] mobile phone

## 2019-08-31 ENCOUNTER — Telehealth: Payer: Self-pay | Admitting: Family Medicine

## 2019-08-31 NOTE — Telephone Encounter (Signed)
Please advise. Thank you

## 2019-08-31 NOTE — Telephone Encounter (Signed)
Please schedule and left family know when

## 2019-08-31 NOTE — Telephone Encounter (Signed)
May do home next week at 4:10 PM on a day that I am present

## 2019-08-31 NOTE — Telephone Encounter (Signed)
Pt was discharged from hospital yesterday. Ethan Faulkner is wanting to set up a hospital follow up. She wants to know if Dr. Nicki Reaper wants virtual or home visit.

## 2019-09-01 ENCOUNTER — Encounter (HOSPITAL_COMMUNITY): Payer: Self-pay | Admitting: Emergency Medicine

## 2019-09-01 ENCOUNTER — Other Ambulatory Visit: Payer: Self-pay

## 2019-09-01 ENCOUNTER — Emergency Department (HOSPITAL_COMMUNITY)
Admission: EM | Admit: 2019-09-01 | Discharge: 2019-09-01 | Disposition: A | Discharge status: Home / Self Care | Payer: Medicare Other | Attending: Emergency Medicine | Admitting: Emergency Medicine

## 2019-09-01 ENCOUNTER — Telehealth: Payer: Self-pay | Admitting: Family Medicine

## 2019-09-01 DIAGNOSIS — I10 Essential (primary) hypertension: Secondary | ICD-10-CM | POA: Diagnosis not present

## 2019-09-01 DIAGNOSIS — Z87891 Personal history of nicotine dependence: Secondary | ICD-10-CM | POA: Insufficient documentation

## 2019-09-01 DIAGNOSIS — R197 Diarrhea, unspecified: Secondary | ICD-10-CM | POA: Insufficient documentation

## 2019-09-01 DIAGNOSIS — Z79899 Other long term (current) drug therapy: Secondary | ICD-10-CM | POA: Insufficient documentation

## 2019-09-01 DIAGNOSIS — E039 Hypothyroidism, unspecified: Secondary | ICD-10-CM | POA: Diagnosis not present

## 2019-09-01 DIAGNOSIS — Z7901 Long term (current) use of anticoagulants: Secondary | ICD-10-CM | POA: Insufficient documentation

## 2019-09-01 DIAGNOSIS — Z96642 Presence of left artificial hip joint: Secondary | ICD-10-CM | POA: Diagnosis not present

## 2019-09-01 LAB — URINALYSIS, ROUTINE W REFLEX MICROSCOPIC
Bilirubin Urine: NEGATIVE
Glucose, UA: NEGATIVE mg/dL
Ketones, ur: NEGATIVE mg/dL
Leukocytes,Ua: NEGATIVE
Nitrite: NEGATIVE
Protein, ur: NEGATIVE mg/dL
Specific Gravity, Urine: 1.015 (ref 1.005–1.030)
pH: 5 (ref 5.0–8.0)

## 2019-09-01 LAB — COMPREHENSIVE METABOLIC PANEL
ALT: 16 U/L (ref 0–44)
AST: 20 U/L (ref 15–41)
Albumin: 3.6 g/dL (ref 3.5–5.0)
Alkaline Phosphatase: 61 U/L (ref 38–126)
Anion gap: 7 (ref 5–15)
BUN: 21 mg/dL (ref 8–23)
CO2: 24 mmol/L (ref 22–32)
Calcium: 8.7 mg/dL — ABNORMAL LOW (ref 8.9–10.3)
Chloride: 105 mmol/L (ref 98–111)
Creatinine, Ser: 0.78 mg/dL (ref 0.61–1.24)
GFR calc Af Amer: >60 mL/min (ref >60)
GFR calc non Af Amer: >60 mL/min (ref >60)
Glucose, Bld: 117 mg/dL — ABNORMAL HIGH (ref 70–99)
Potassium: 3.8 mmol/L (ref 3.5–5.1)
Sodium: 136 mmol/L (ref 135–145)
Total Bilirubin: 1.3 mg/dL — ABNORMAL HIGH (ref 0.3–1.2)
Total Protein: 6.8 g/dL (ref 6.5–8.1)

## 2019-09-01 LAB — CBC
HCT: 38.6 % — ABNORMAL LOW (ref 39.0–52.0)
Hemoglobin: 12.4 g/dL — ABNORMAL LOW (ref 13.0–17.0)
MCH: 31.2 pg (ref 26.0–34.0)
MCHC: 32.1 g/dL (ref 30.0–36.0)
MCV: 97 fL (ref 80.0–100.0)
Platelets: 167 10*3/uL (ref 150–400)
RBC: 3.98 MIL/uL — ABNORMAL LOW (ref 4.22–5.81)
RDW: 13.7 % (ref 11.5–15.5)
WBC: 3.9 10*3/uL — ABNORMAL LOW (ref 4.0–10.5)
nRBC: 0 % (ref 0.0–0.2)

## 2019-09-01 LAB — LIPASE, BLOOD: Lipase: 65 U/L — ABNORMAL HIGH (ref 11–51)

## 2019-09-01 MED ORDER — SODIUM CHLORIDE 0.9% FLUSH: 3.0000 mL | Freq: Once | INTRAVENOUS | Status: DC

## 2019-09-01 MED ORDER — SODIUM CHLORIDE 0.9 % IV BOLUS
500.0000 mL | Freq: Once | INTRAVENOUS | Status: AC
  Administered 2019-09-01: 500 mL via INTRAVENOUS

## 2019-09-01 NOTE — ED Provider Notes (Addendum)
[facility] EMERGENCY DEPARTMENT Provider Note   CSN: [id number] Arrival date & time: 09/01/19  1242     History Chief Complaint  Patient presents with  . Diarrhea    Ethan Faulkner is a 86 y.o. male.  Patient with recent admission w constipation, and aspiration event, placed on augmentin, presents to ED with diarrhea since d/c from hospital. Symptoms acute onset, moderate, episodic, persistent. Several episodes per day, watery, not bloody. No abd pain or distension. No vomiting. Relatively poor po intake. Has chronic foley and still is making approximately normal amount of urine. Pt limited historian, prior cva, advanced age - level 5 caveat. No syncope or fall. No fevers. Family member indicates pcp instructed them to come to ED to get checked.   The history is provided by the patient and a relative.  Diarrhea Associated symptoms: no fever        Past Medical History:  Diagnosis Date  . Afib ([place])   . Atrial fibrillation ([place])   . Cerebral infarction ([place])   . DDD (degenerative disc disease), lumbar   . Dysphagia   . Hypertension   . Hypothyroidism   . Migraine    "maybe monthly" (11/21/2015)  . Prostate cancer ([place]) ~ 1995   S/P radiation tx  . Renal mass    "slow growing something; doctors just watching it right now" (11/21/2015)  . Stroke [facility])    TIA  . Thrombocytopenia ([place])   . Thyroid disease    hypothyroid  . UTI (urinary tract infection) 03/20/2019    Patient Active Problem List   Diagnosis Date Noted  . Aspiration pneumonia ([place]) 08/29/2019  . Constipation   . Urinary retention 06/23/2019  . Presence of indwelling urinary catheter 06/23/2019  . Frequent UTI 04/04/2019  . Left hemiparesis ([place]) 03/18/2019  . Acute cystitis with hematuria   . Sepsis ([place]) 05/06/2018  . Hyperbilirubinemia 05/06/2018  . Chronic anemia 05/06/2018  . Hyponatremia 05/06/2018  . Physical deconditioning 05/06/2018  . HTN (hypertension) 05/06/2018  . Hypokalemia  12/08/2017  . Nausea and vomiting   . Ileus ([place])   . Urinary tract infection associated with indwelling urethral catheter ([place])   . Small bowel obstruction ([place]) 12/05/2017  . Acute lower UTI 12/05/2017  . Hyperlipidemia 11/09/2017  . Gross hematuria 04/06/2017  . History of stroke 04/06/2017  . Hematuria, unspecified 02/19/2017  . Cerebrovascular accident (CVA) due to embolism of left middle cerebral artery ([place]) 02/14/2017  . 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) 12/03/2016  . Leukopenia   . Thrombocytopenia ([place])   . Fall   . Acute blood loss anemia   . Femoral neck fracture ([place]) 11/20/2015  . Renal malignant tumor ([place]) 06/06/2015  . Long term current use of anticoagulant therapy 04/29/2015  . Hypothyroidism 03/01/2014  . Atrial fibrillation ([place]) 06/09/2013  . STRESS FRACTURE, FOOT 12/12/2008  . CLOSED FRACTURE OF METATARSAL BONE 12/12/2008    Past Surgical History:  Procedure Laterality Date  . COLONOSCOPY  1999  . ESOPHAGOGASTRODUODENOSCOPY    . LAPAROSCOPIC CHOLECYSTECTOMY  1980s  . PROSTATE BIOPSY  ~ 1995  . TOTAL HIP ARTHROPLASTY Left 11/22/2015   Procedure: LEFT HIP HEMIARTHROPLASTY ;  Surgeon: Leandrew Koyanagi, MD;  Location: [place];  Service: Orthopedics;  Laterality: Left;       Family History  Problem Relation Age of Onset  . Stroke Sister        in her 47s  . CAD Neg Hx   . COPD Neg Hx   .  Diabetes Neg Hx   . Heart disease Neg Hx     Social History   Tobacco Use  . Smoking status: Former Research scientist (life sciences)  . Smokeless tobacco: Never Used  . Tobacco comment: 11/21/2015 "might have smoked 2 packs of cigarettes in my lifetime"  Substance Use Topics  . Alcohol use: Yes    Comment: 11/21/2015 "last beer I've had was in ~ 2007"  . Drug use: No    Home Medications Prior to Admission medications   Medication Sig Start Date End Date Taking? Authorizing Provider  acetaminophen (TYLENOL) 500 MG tablet Take 1,000 mg by mouth daily as needed for mild pain  or moderate pain.    [provider]  amLODipine (NORVASC) 5 MG tablet TAKE (1) TABLET BY MOUTH ONCE DAILY. Patient taking differently: Take 5 mg by mouth every morning.  08/01/19   Kathyrn Drown, MD  amoxicillin-clavulanate (AUGMENTIN) 250-62.5 MG/5ML suspension Take 10 mLs (500 mg total) by mouth 2 (two) times daily for 5 days. 08/30/19 09/04/19  Manuella Ghazi, Pratik D, DO  atorvastatin (LIPITOR) 20 MG tablet TAKE 1 TABLET BY MOUTH ONCE DAILY AT 6:00 PM. Patient taking differently: Take 20 mg by mouth daily at 6 PM.  08/16/19   Luking, Elayne Snare, MD  cetirizine (ZYRTEC) 10 MG tablet Take 10 mg by mouth every evening.     [provider]  docusate sodium (COLACE) 100 MG capsule Take 1 capsule (100 mg total) by mouth 2 (two) times daily. 08/30/19   Manuella Ghazi, Pratik D, DO  ELIQUIS 2.5 MG TABS tablet TAKE (1) TABLET BY MOUTH TWICE DAILY 9AM AND 9PM. Patient taking differently: Take 2.5 mg by mouth 2 (two) times daily.  02/09/19   Kathyrn Drown, MD  guaiFENesin (MUCINEX) 600 MG 12 hr tablet Take 600 mg by mouth daily.    [provider]  ketoconazole (NIZORAL) 2 % cream Apply 1 application topically 2 (two) times daily.    [provider]  levothyroxine (SYNTHROID) 25 MCG tablet TAKE 1 TABLET EVERY MORNING ON AN EMPTY STOMACH FOR THYROID. Patient taking differently: Take 25 mcg by mouth daily before breakfast.  07/11/19   Kathyrn Drown, MD  lidocaine (XYLOCAINE) 2 % jelly Place 1 application into the urethra as needed. Insert into urethra 5 minutes prior  prior to foley insertion 06/23/19   Irine Seal, MD  metoprolol succinate (TOPROL-XL) 25 MG 24 hr tablet TAKE (1) TABLET BY MOUTH ONCE DAILY. Patient taking differently: Take 25 mg by mouth every evening.  02/28/19   Kathyrn Drown, MD  nystatin (MYCOSTATIN/NYSTOP) powder Apply 1 application topically 2 (two) times daily. 07/04/19   Irine Seal, MD  QC NATURA-LAX 17 GM/SCOOP powder MIX 1 CAPFUL IN 8 OZ OF WATER AND DRINK TWICE  WEEKLY. Patient taking differently: Take 17 g by mouth daily as needed for mild constipation or moderate constipation.  12/06/18   Kathyrn Drown, MD  traMADol (ULTRAM) 50 MG tablet TAKE ONE TABLET 3 TIMES DAILY AS NEEDED FOR SEVERE PAIN. Patient taking differently: Take 50 mg by mouth 3 (three) times daily as needed for moderate pain or severe pain.  02/28/19   Kathyrn Drown, MD    Allergies    Xanax [alprazolam] and Tape  Review of Systems   Review of Systems  Unable to perform ROS: Dementia  Constitutional: Negative for fever.  Gastrointestinal: Positive for diarrhea.  level  5 caveat.     Physical Exam Updated Vital Signs BP 127/69  Pulse 84   Temp 98.9 F (37.2 C) (Oral)   Resp 17   Ht 1.803 m (5\' 11" )   Wt 72.1 kg   SpO2 95%   BMI 22.18 kg/m   Physical Exam Vitals and nursing note reviewed.  Constitutional:      Appearance: Normal appearance. He is well-developed.  HENT:     Head: Atraumatic.     Nose: Nose normal.     Mouth/Throat:     Mouth: Mucous membranes are moist.     Pharynx: Oropharynx is clear.  Eyes:     General: No scleral icterus.    Conjunctiva/sclera: Conjunctivae normal.     Pupils: Pupils are equal, round, and reactive to light.  Neck:     Trachea: No tracheal deviation.  Cardiovascular:     Rate and Rhythm: Normal rate and regular rhythm.     Pulses: Normal pulses.     Heart sounds: Normal heart sounds. No murmur. No friction rub. No gallop.   Pulmonary:     Effort: Pulmonary effort is normal. No accessory muscle usage or respiratory distress.     Breath sounds: Normal breath sounds.  Abdominal:     General: Bowel sounds are normal. There is no distension.     Palpations: Abdomen is soft.     Tenderness: There is no abdominal tenderness. There is no guarding.  Genitourinary:    Comments: No cva tenderness. Normal external gu exam. Foley catheter in place, clear yellow urine in bag. Minimal/superficial skin excoriation in perianal  area. No cellulitis. No devitalized tissue. No ulcers.  Musculoskeletal:        General: No swelling.     Cervical back: Normal range of motion and neck supple. No rigidity.  Skin:    General: Skin is warm and dry.     Findings: No rash.  Neurological:     Mental Status: He is alert.     Comments: Alert, mental status/functional ability c/w baseline.   Psychiatric:        Mood and Affect: Mood normal.     ED Results / Procedures / Treatments   Labs (all labs ordered are listed, but only abnormal results are displayed) Results for orders placed or performed during the hospital encounter of 09/01/19  Lipase, blood  Result Value Ref Range   Lipase 65 (H) 11 - 51 U/L  Comprehensive metabolic panel  Result Value Ref Range   Sodium 136 135 - 145 mmol/L   Potassium 3.8 3.5 - 5.1 mmol/L   Chloride 105 98 - 111 mmol/L   CO2 24 22 - 32 mmol/L   Glucose, Bld 117 (H) 70 - 99 mg/dL   BUN 21 8 - 23 mg/dL   Creatinine, Ser 0.78 0.61 - 1.24 mg/dL   Calcium 8.7 (L) 8.9 - 10.3 mg/dL   Total Protein 6.8 6.5 - 8.1 g/dL   Albumin 3.6 3.5 - 5.0 g/dL   AST 20 15 - 41 U/L   ALT 16 0 - 44 U/L   Alkaline Phosphatase 61 38 - 126 U/L   Total Bilirubin 1.3 (H) 0.3 - 1.2 mg/dL   GFR calc non Af Amer >60 >60 mL/min   GFR calc Af Amer >60 >60 mL/min   Anion gap 7 5 - 15  CBC  Result Value Ref Range   WBC 3.9 (L) 4.0 - 10.5 K/uL   RBC 3.98 (L) 4.22 - 5.81 MIL/uL   Hemoglobin 12.4 (L) 13.0 - 17.0 g/dL   HCT 38.6 (  L) 39.0 - 52.0 %   MCV 97.0 80.0 - 100.0 fL   MCH 31.2 26.0 - 34.0 pg   MCHC 32.1 30.0 - 36.0 g/dL   RDW 13.7 11.5 - 15.5 %   Platelets 167 150 - 400 K/uL   nRBC 0.0 0.0 - 0.2 %  Urinalysis, Routine w reflex microscopic  Result Value Ref Range   Color, Urine YELLOW YELLOW   APPearance CLEAR CLEAR   Specific Gravity, Urine 1.015 1.005 - 1.030   pH 5.0 5.0 - 8.0   Glucose, UA NEGATIVE NEGATIVE mg/dL   Hgb urine dipstick MODERATE (A) NEGATIVE   Bilirubin Urine NEGATIVE NEGATIVE    Ketones, ur NEGATIVE NEGATIVE mg/dL   Protein, ur NEGATIVE NEGATIVE mg/dL   Nitrite NEGATIVE NEGATIVE   Leukocytes,Ua NEGATIVE NEGATIVE   RBC / HPF 6-10 0 - 5 RBC/hpf   WBC, UA 6-10 0 - 5 WBC/hpf   Bacteria, UA RARE (A) NONE SEEN   Squamous Epithelial / LPF 0-5 0 - 5   Mucus PRESENT    EKG None  Radiology No results found.  Procedures Procedures (including critical care time)  Medications Ordered in ED Medications  sodium chloride flush (NS) 0.9 % injection 3 mL (has no administration in time range)  sodium chloride 0.9 % bolus 500 mL (has no administration in time range)    ED Course  I have reviewed the triage vital signs and the nursing notes.  Pertinent labs & imaging results that were available during my care of the patient were reviewed by me and considered in my medical decision making (see chart for details).    MDM Rules/Calculators/A&P                      Labs sent. Iv ns bolus. Po fluids.   Reviewed nursing notes and prior charts for additional history.   Since arrival in ED, no bm/diarrhea as of yet.   Labs reviewed/interpreted by me - chem normal, renal fxn normal.   Recheck, pt alert, content, no distress.  During 5-6 hours in ED, no diarrhea stools.   Abd soft nt.   Pt currently appears stable for d/c.   Will make home health referral, and rec pcp f/u.  Return precautions provided.       Final Clinical Impression(s) / ED Diagnoses Final diagnoses:  None    Rx / DC Orders ED Discharge Orders    None     Marland Kitchen    Lajean Saver, MD 09/01/19 [phone]

## 2019-09-01 NOTE — Telephone Encounter (Signed)
Now having severe diarrhea, on Amoxicillin, not drinking, lethargic.Transfer to nurse

## 2019-09-01 NOTE — Telephone Encounter (Signed)
Patient is being transported by EMS to hospital for decline in condition.  I am assuming we are holding off on scheduling a home visit for now?

## 2019-09-01 NOTE — Telephone Encounter (Signed)
Spoke with daughter Venida Jarvis. Daughter states patient is having weakness, lethargic moments, constant diarrhea, not eating, not drinking, not able to walk (even with assistance). Pt is now developed blisters on bottom from constant diarrhea. Pt was recently in hospital from 08/28/19-08/30/19. Pt has been taking Amoxicillin 250 mg for pneumonia. Spoke with provider, provider states that with everything going on, it would be best to go back to ER for evaluation. Daughter verbalized understanding. Daughter is going to call EMS to come get patient. Triage at [facility] contacted and made aware that he will be arriving via EMS.

## 2019-09-01 NOTE — Telephone Encounter (Signed)
Let us wait to see how this comes out over the next few days please touch base with me early next week thank you

## 2019-09-01 NOTE — Discharge Instructions (Signed)
It was our pleasure to provide your ER care today - we hope that you feel better.  You may stop the augmentin.   Encourage patient to drink plenty of fluids, and take in adequate nutrition.  For peri-anal area, continue to keep skin very clean, rotate/change position. You may also use barrier cream such as Desitin or A & D.  We have made a home health referral - agency should contact you in next couple days.   Also follow up closely with primary care doctor in the coming week.   Return to ER if worse, new symptoms, fevers, new or severe pain, trouble breathing, persistent vomiting, or other concern.

## 2019-09-01 NOTE — Telephone Encounter (Signed)
I agree with this triage and did speak with the nurse regarding this Too much concern for possibility dehydration, C. difficile, etc. therefore needs ED evaluation

## 2019-09-01 NOTE — ED Triage Notes (Signed)
D/C from hospital WED.  Pt has had diarrhea constantly since d/c.  Lethargic and difficulty taking in oral fluids.

## 2019-09-02 ENCOUNTER — Telehealth: Payer: Self-pay | Admitting: Family Medicine

## 2019-09-02 ENCOUNTER — Ambulatory Visit: Payer: Medicare Other | Admitting: Family Medicine

## 2019-09-02 ENCOUNTER — Other Ambulatory Visit: Payer: Self-pay | Admitting: Family Medicine

## 2019-09-02 DIAGNOSIS — E441 Mild protein-calorie malnutrition: Secondary | ICD-10-CM

## 2019-09-02 DIAGNOSIS — R634 Abnormal weight loss: Secondary | ICD-10-CM

## 2019-09-02 DIAGNOSIS — J690 Pneumonitis due to inhalation of food and vomit: Secondary | ICD-10-CM

## 2019-09-02 DIAGNOSIS — I480 Paroxysmal atrial fibrillation: Secondary | ICD-10-CM

## 2019-09-02 DIAGNOSIS — Z8673 Personal history of transient ischemic attack (TIA), and cerebral infarction without residual deficits: Secondary | ICD-10-CM

## 2019-09-02 DIAGNOSIS — I7090 Unspecified atherosclerosis: Secondary | ICD-10-CM

## 2019-09-02 DIAGNOSIS — F0150 Vascular dementia without behavioral disturbance: Secondary | ICD-10-CM

## 2019-09-02 DIAGNOSIS — R5381 Other malaise: Secondary | ICD-10-CM

## 2019-09-02 MED ORDER — LORAZEPAM 2 MG/ML PO CONC: ORAL | 0 refills | Status: AC

## 2019-09-02 MED ORDER — MORPHINE SULFATE 20 MG/5ML PO SOLN: ORAL | 0 refills | Status: AC

## 2019-09-02 NOTE — Telephone Encounter (Signed)
Patient was treated in the ER and released  More than likely the family would be interested in doing a follow-up visit I am willing to do a home visit 4:10 p.m. on a date that works for the family and that I am present in the office

## 2019-09-02 NOTE — Telephone Encounter (Signed)
Patient has deteriorated Family has chosen hospice We have helped initiate the referral I did speak with the on-call nurse with hospice on Saturday I have done a home visit on Saturday I also sent in lorazepam and morphine with instructions I have discussed the case with [name] the power of attorney  Nurses-please put in referral to hospice with the above notations  Front-please see if we can add the patient as a office visit on Saturday if not add them to my schedule for Monday

## 2019-09-03 LAB — CULTURE, BLOOD (ROUTINE X 2)
Culture: NO GROWTH
Culture: NO GROWTH
Special Requests: ADEQUATE
Special Requests: ADEQUATE

## 2019-09-04 ENCOUNTER — Other Ambulatory Visit: Payer: Self-pay

## 2019-09-04 DIAGNOSIS — I4891 Unspecified atrial fibrillation: Secondary | ICD-10-CM | POA: Diagnosis not present

## 2019-09-04 DIAGNOSIS — R531 Weakness: Secondary | ICD-10-CM | POA: Diagnosis not present

## 2019-09-04 DIAGNOSIS — R4701 Aphasia: Secondary | ICD-10-CM | POA: Diagnosis not present

## 2019-09-04 DIAGNOSIS — E039 Hypothyroidism, unspecified: Secondary | ICD-10-CM | POA: Diagnosis not present

## 2019-09-04 DIAGNOSIS — I639 Cerebral infarction, unspecified: Secondary | ICD-10-CM | POA: Diagnosis not present

## 2019-09-04 DIAGNOSIS — E46 Unspecified protein-calorie malnutrition: Secondary | ICD-10-CM | POA: Diagnosis not present

## 2019-09-04 DIAGNOSIS — R630 Anorexia: Secondary | ICD-10-CM | POA: Diagnosis not present

## 2019-09-04 DIAGNOSIS — I10 Essential (primary) hypertension: Secondary | ICD-10-CM | POA: Diagnosis not present

## 2019-09-04 NOTE — Addendum Note (Signed)
Addended by: Dairl Ponder on: 09/04/2019 09:23 AM   Modules accepted: Orders

## 2019-09-04 NOTE — Telephone Encounter (Signed)
Referral ordered in Epic. 

## 2019-09-05 DIAGNOSIS — R630 Anorexia: Secondary | ICD-10-CM | POA: Diagnosis not present

## 2019-09-05 DIAGNOSIS — I639 Cerebral infarction, unspecified: Secondary | ICD-10-CM | POA: Diagnosis not present

## 2019-09-05 DIAGNOSIS — E46 Unspecified protein-calorie malnutrition: Secondary | ICD-10-CM | POA: Diagnosis not present

## 2019-09-05 DIAGNOSIS — I10 Essential (primary) hypertension: Secondary | ICD-10-CM | POA: Diagnosis not present

## 2019-09-05 DIAGNOSIS — E039 Hypothyroidism, unspecified: Secondary | ICD-10-CM | POA: Diagnosis not present

## 2019-09-05 DIAGNOSIS — I4891 Unspecified atrial fibrillation: Secondary | ICD-10-CM | POA: Diagnosis not present

## 2019-09-06 DIAGNOSIS — E46 Unspecified protein-calorie malnutrition: Secondary | ICD-10-CM | POA: Diagnosis not present

## 2019-09-06 DIAGNOSIS — I10 Essential (primary) hypertension: Secondary | ICD-10-CM | POA: Diagnosis not present

## 2019-09-06 DIAGNOSIS — I639 Cerebral infarction, unspecified: Secondary | ICD-10-CM | POA: Diagnosis not present

## 2019-09-06 DIAGNOSIS — R630 Anorexia: Secondary | ICD-10-CM | POA: Diagnosis not present

## 2019-09-06 DIAGNOSIS — E039 Hypothyroidism, unspecified: Secondary | ICD-10-CM | POA: Diagnosis not present

## 2019-09-06 DIAGNOSIS — I4891 Unspecified atrial fibrillation: Secondary | ICD-10-CM | POA: Diagnosis not present

## 2019-09-07 DIAGNOSIS — E039 Hypothyroidism, unspecified: Secondary | ICD-10-CM | POA: Diagnosis not present

## 2019-09-07 DIAGNOSIS — I639 Cerebral infarction, unspecified: Secondary | ICD-10-CM | POA: Diagnosis not present

## 2019-09-07 DIAGNOSIS — E46 Unspecified protein-calorie malnutrition: Secondary | ICD-10-CM | POA: Diagnosis not present

## 2019-09-07 DIAGNOSIS — I10 Essential (primary) hypertension: Secondary | ICD-10-CM | POA: Diagnosis not present

## 2019-09-07 DIAGNOSIS — I4891 Unspecified atrial fibrillation: Secondary | ICD-10-CM | POA: Diagnosis not present

## 2019-09-07 DIAGNOSIS — R630 Anorexia: Secondary | ICD-10-CM | POA: Diagnosis not present

## 2019-09-12 DIAGNOSIS — I639 Cerebral infarction, unspecified: Secondary | ICD-10-CM | POA: Diagnosis not present

## 2019-09-12 DIAGNOSIS — R630 Anorexia: Secondary | ICD-10-CM | POA: Diagnosis not present

## 2019-09-12 DIAGNOSIS — I10 Essential (primary) hypertension: Secondary | ICD-10-CM | POA: Diagnosis not present

## 2019-09-12 DIAGNOSIS — E46 Unspecified protein-calorie malnutrition: Secondary | ICD-10-CM | POA: Diagnosis not present

## 2019-09-12 DIAGNOSIS — I4891 Unspecified atrial fibrillation: Secondary | ICD-10-CM | POA: Diagnosis not present

## 2019-09-12 DIAGNOSIS — E039 Hypothyroidism, unspecified: Secondary | ICD-10-CM | POA: Diagnosis not present

## 2019-09-13 NOTE — Telephone Encounter (Signed)
Documentation completed 

## 2019-09-13 NOTE — Progress Notes (Signed)
This homePatient bile Not eating well not drinking well Not urinating or stooling well Recently in the hospital for aspiration pneumonia and constipation.  Long discussion held with family they do not want aggressive measures and I agree with their decision  Patient is not doing well statistically unlikely to live more than 6 months Losing weight dehydrated with aspiration pneumonia I believe the patient qualifies for hospice Family is desiring this Consultation was made with hospice  Lungs are clear respiratory rate normal HEENT benign patient cachectic abdomen soft patient nonverbal due to stroke  Will proceed forward with palliative care follow-up again in 6 weeks time  40 minutes spent with family between visit with the patient reviewing of recent hospitalization plus also consultation with hospice

## 2019-09-14 DIAGNOSIS — E46 Unspecified protein-calorie malnutrition: Secondary | ICD-10-CM | POA: Diagnosis not present

## 2019-09-14 DIAGNOSIS — E039 Hypothyroidism, unspecified: Secondary | ICD-10-CM | POA: Diagnosis not present

## 2019-09-14 DIAGNOSIS — I10 Essential (primary) hypertension: Secondary | ICD-10-CM | POA: Diagnosis not present

## 2019-09-14 DIAGNOSIS — R630 Anorexia: Secondary | ICD-10-CM | POA: Diagnosis not present

## 2019-09-14 DIAGNOSIS — I639 Cerebral infarction, unspecified: Secondary | ICD-10-CM | POA: Diagnosis not present

## 2019-09-14 DIAGNOSIS — I4891 Unspecified atrial fibrillation: Secondary | ICD-10-CM | POA: Diagnosis not present

## 2019-09-18 ENCOUNTER — Other Ambulatory Visit: Payer: Self-pay | Admitting: Family Medicine

## 2019-09-22 DIAGNOSIS — I639 Cerebral infarction, unspecified: Secondary | ICD-10-CM | POA: Diagnosis not present

## 2019-09-22 DIAGNOSIS — E039 Hypothyroidism, unspecified: Secondary | ICD-10-CM | POA: Diagnosis not present

## 2019-09-22 DIAGNOSIS — I4891 Unspecified atrial fibrillation: Secondary | ICD-10-CM | POA: Diagnosis not present

## 2019-09-22 DIAGNOSIS — I10 Essential (primary) hypertension: Secondary | ICD-10-CM | POA: Diagnosis not present

## 2019-09-22 DIAGNOSIS — R630 Anorexia: Secondary | ICD-10-CM | POA: Diagnosis not present

## 2019-09-22 DIAGNOSIS — E46 Unspecified protein-calorie malnutrition: Secondary | ICD-10-CM | POA: Diagnosis not present

## 2019-09-23 DIAGNOSIS — E039 Hypothyroidism, unspecified: Secondary | ICD-10-CM | POA: Diagnosis not present

## 2019-09-23 DIAGNOSIS — I10 Essential (primary) hypertension: Secondary | ICD-10-CM | POA: Diagnosis not present

## 2019-09-23 DIAGNOSIS — I639 Cerebral infarction, unspecified: Secondary | ICD-10-CM | POA: Diagnosis not present

## 2019-09-23 DIAGNOSIS — R630 Anorexia: Secondary | ICD-10-CM | POA: Diagnosis not present

## 2019-09-23 DIAGNOSIS — I4891 Unspecified atrial fibrillation: Secondary | ICD-10-CM | POA: Diagnosis not present

## 2019-09-23 DIAGNOSIS — R4701 Aphasia: Secondary | ICD-10-CM | POA: Diagnosis not present

## 2019-09-23 DIAGNOSIS — R531 Weakness: Secondary | ICD-10-CM | POA: Diagnosis not present

## 2019-09-23 DIAGNOSIS — E46 Unspecified protein-calorie malnutrition: Secondary | ICD-10-CM | POA: Diagnosis not present

## 2019-09-27 ENCOUNTER — Telehealth: Payer: Self-pay | Admitting: Family Medicine

## 2019-09-27 ENCOUNTER — Other Ambulatory Visit: Payer: Self-pay | Admitting: *Deleted

## 2019-09-27 DIAGNOSIS — R630 Anorexia: Secondary | ICD-10-CM | POA: Diagnosis not present

## 2019-09-27 DIAGNOSIS — E039 Hypothyroidism, unspecified: Secondary | ICD-10-CM | POA: Diagnosis not present

## 2019-09-27 DIAGNOSIS — I4891 Unspecified atrial fibrillation: Secondary | ICD-10-CM | POA: Diagnosis not present

## 2019-09-27 DIAGNOSIS — I639 Cerebral infarction, unspecified: Secondary | ICD-10-CM | POA: Diagnosis not present

## 2019-09-27 DIAGNOSIS — I10 Essential (primary) hypertension: Secondary | ICD-10-CM | POA: Diagnosis not present

## 2019-09-27 DIAGNOSIS — E46 Unspecified protein-calorie malnutrition: Secondary | ICD-10-CM | POA: Diagnosis not present

## 2019-09-27 MED ORDER — MICONAZOLE NITRATE 2 % EX AERP: INHALATION_SPRAY | CUTANEOUS | 3 refills | Status: AC

## 2019-09-27 NOTE — Telephone Encounter (Signed)
Ethan Faulkner with Hospice calling requesting miconazole powder be sent to [facility]

## 2019-09-27 NOTE — Telephone Encounter (Signed)
Sent to [facility] 

## 2019-09-27 NOTE — Telephone Encounter (Signed)
May send in prescription via hospice via [facility] for the powder may be used 4 times daily as needed quantity sufficient-may dispense whatever size they typically do with 3 refills

## 2019-09-28 DIAGNOSIS — I639 Cerebral infarction, unspecified: Secondary | ICD-10-CM | POA: Diagnosis not present

## 2019-09-28 DIAGNOSIS — R630 Anorexia: Secondary | ICD-10-CM | POA: Diagnosis not present

## 2019-09-28 DIAGNOSIS — E039 Hypothyroidism, unspecified: Secondary | ICD-10-CM | POA: Diagnosis not present

## 2019-09-28 DIAGNOSIS — I4891 Unspecified atrial fibrillation: Secondary | ICD-10-CM | POA: Diagnosis not present

## 2019-09-28 DIAGNOSIS — E46 Unspecified protein-calorie malnutrition: Secondary | ICD-10-CM | POA: Diagnosis not present

## 2019-09-28 DIAGNOSIS — I10 Essential (primary) hypertension: Secondary | ICD-10-CM | POA: Diagnosis not present

## 2019-10-03 DIAGNOSIS — R630 Anorexia: Secondary | ICD-10-CM | POA: Diagnosis not present

## 2019-10-03 DIAGNOSIS — I4891 Unspecified atrial fibrillation: Secondary | ICD-10-CM | POA: Diagnosis not present

## 2019-10-03 DIAGNOSIS — E46 Unspecified protein-calorie malnutrition: Secondary | ICD-10-CM | POA: Diagnosis not present

## 2019-10-03 DIAGNOSIS — E039 Hypothyroidism, unspecified: Secondary | ICD-10-CM | POA: Diagnosis not present

## 2019-10-03 DIAGNOSIS — I10 Essential (primary) hypertension: Secondary | ICD-10-CM | POA: Diagnosis not present

## 2019-10-03 DIAGNOSIS — I639 Cerebral infarction, unspecified: Secondary | ICD-10-CM | POA: Diagnosis not present

## 2019-10-05 ENCOUNTER — Telehealth: Payer: Self-pay | Admitting: Family Medicine

## 2019-10-05 DIAGNOSIS — E46 Unspecified protein-calorie malnutrition: Secondary | ICD-10-CM | POA: Diagnosis not present

## 2019-10-05 DIAGNOSIS — I10 Essential (primary) hypertension: Secondary | ICD-10-CM | POA: Diagnosis not present

## 2019-10-05 DIAGNOSIS — I4891 Unspecified atrial fibrillation: Secondary | ICD-10-CM | POA: Diagnosis not present

## 2019-10-05 DIAGNOSIS — E039 Hypothyroidism, unspecified: Secondary | ICD-10-CM | POA: Diagnosis not present

## 2019-10-05 DIAGNOSIS — I639 Cerebral infarction, unspecified: Secondary | ICD-10-CM | POA: Diagnosis not present

## 2019-10-05 DIAGNOSIS — R630 Anorexia: Secondary | ICD-10-CM | POA: Diagnosis not present

## 2019-10-05 NOTE — Telephone Encounter (Signed)
Home Health called to let you know patient fell yesterday but is ok

## 2019-10-05 NOTE — Telephone Encounter (Signed)
Hi,  Can we call pt or caregiver to see how he's doing since his fall?  Thx,    Dr. Lovena Le

## 2019-10-06 NOTE — Telephone Encounter (Signed)
Daughter [name] [facility]) stated the patient is doing good. Patient never complained of pain and had no bruising or effects from fall.

## 2019-10-06 NOTE — Telephone Encounter (Signed)
Left message to return call 

## 2019-10-09 ENCOUNTER — Telehealth: Payer: Self-pay | Admitting: Family Medicine

## 2019-10-09 ENCOUNTER — Telehealth: Payer: Self-pay | Admitting: Urology

## 2019-10-09 NOTE — Telephone Encounter (Signed)
Tabitha with Hospice calling stating pt is due to have a catheter change this week. Currently he has a 22 gauge catheter. They are wanting to know if there is any reason it is that big. They are wanting to know if there is a reason it is a bigger size. Is it ok to change to an 18 gauge. If the 22 is what is needed they will order one and hold off on changing it.

## 2019-10-09 NOTE — Telephone Encounter (Signed)
Home Health nurse Tabitha called.  Nurse unable to obtain 29f cath for home health foley change. [facility] nurse asked if they could place a 84f this cath change. Orders given to nurse for cath size change but informed nurse the cath may leak d/t decrease in cath size. Nurse verbalized understanding.

## 2019-10-09 NOTE — Telephone Encounter (Signed)
She requests a nurse return her call.

## 2019-10-09 NOTE — Telephone Encounter (Signed)
Contacted Tabitha (hospice)and informed her that we did not order catheter. Pt has urologist Dr.Wrenn and that she would need to call the urology office. Phone number given for Dr.Wrenn.

## 2019-10-10 DIAGNOSIS — R630 Anorexia: Secondary | ICD-10-CM | POA: Diagnosis not present

## 2019-10-10 DIAGNOSIS — I10 Essential (primary) hypertension: Secondary | ICD-10-CM | POA: Diagnosis not present

## 2019-10-10 DIAGNOSIS — E46 Unspecified protein-calorie malnutrition: Secondary | ICD-10-CM | POA: Diagnosis not present

## 2019-10-10 DIAGNOSIS — I4891 Unspecified atrial fibrillation: Secondary | ICD-10-CM | POA: Diagnosis not present

## 2019-10-10 DIAGNOSIS — I639 Cerebral infarction, unspecified: Secondary | ICD-10-CM | POA: Diagnosis not present

## 2019-10-10 DIAGNOSIS — E039 Hypothyroidism, unspecified: Secondary | ICD-10-CM | POA: Diagnosis not present

## 2019-10-11 DIAGNOSIS — I4891 Unspecified atrial fibrillation: Secondary | ICD-10-CM | POA: Diagnosis not present

## 2019-10-11 DIAGNOSIS — R630 Anorexia: Secondary | ICD-10-CM | POA: Diagnosis not present

## 2019-10-11 DIAGNOSIS — E46 Unspecified protein-calorie malnutrition: Secondary | ICD-10-CM | POA: Diagnosis not present

## 2019-10-11 DIAGNOSIS — I10 Essential (primary) hypertension: Secondary | ICD-10-CM | POA: Diagnosis not present

## 2019-10-11 DIAGNOSIS — E039 Hypothyroidism, unspecified: Secondary | ICD-10-CM | POA: Diagnosis not present

## 2019-10-11 DIAGNOSIS — I639 Cerebral infarction, unspecified: Secondary | ICD-10-CM | POA: Diagnosis not present

## 2019-10-12 DIAGNOSIS — I4891 Unspecified atrial fibrillation: Secondary | ICD-10-CM | POA: Diagnosis not present

## 2019-10-12 DIAGNOSIS — I10 Essential (primary) hypertension: Secondary | ICD-10-CM | POA: Diagnosis not present

## 2019-10-12 DIAGNOSIS — E46 Unspecified protein-calorie malnutrition: Secondary | ICD-10-CM | POA: Diagnosis not present

## 2019-10-12 DIAGNOSIS — R630 Anorexia: Secondary | ICD-10-CM | POA: Diagnosis not present

## 2019-10-12 DIAGNOSIS — I639 Cerebral infarction, unspecified: Secondary | ICD-10-CM | POA: Diagnosis not present

## 2019-10-12 DIAGNOSIS — E039 Hypothyroidism, unspecified: Secondary | ICD-10-CM | POA: Diagnosis not present

## 2019-10-17 DIAGNOSIS — I4891 Unspecified atrial fibrillation: Secondary | ICD-10-CM | POA: Diagnosis not present

## 2019-10-17 DIAGNOSIS — E46 Unspecified protein-calorie malnutrition: Secondary | ICD-10-CM | POA: Diagnosis not present

## 2019-10-17 DIAGNOSIS — R630 Anorexia: Secondary | ICD-10-CM | POA: Diagnosis not present

## 2019-10-17 DIAGNOSIS — E039 Hypothyroidism, unspecified: Secondary | ICD-10-CM | POA: Diagnosis not present

## 2019-10-17 DIAGNOSIS — I10 Essential (primary) hypertension: Secondary | ICD-10-CM | POA: Diagnosis not present

## 2019-10-17 DIAGNOSIS — I639 Cerebral infarction, unspecified: Secondary | ICD-10-CM | POA: Diagnosis not present

## 2019-10-18 DIAGNOSIS — E46 Unspecified protein-calorie malnutrition: Secondary | ICD-10-CM | POA: Diagnosis not present

## 2019-10-18 DIAGNOSIS — I639 Cerebral infarction, unspecified: Secondary | ICD-10-CM | POA: Diagnosis not present

## 2019-10-18 DIAGNOSIS — E039 Hypothyroidism, unspecified: Secondary | ICD-10-CM | POA: Diagnosis not present

## 2019-10-18 DIAGNOSIS — I4891 Unspecified atrial fibrillation: Secondary | ICD-10-CM | POA: Diagnosis not present

## 2019-10-18 DIAGNOSIS — I10 Essential (primary) hypertension: Secondary | ICD-10-CM | POA: Diagnosis not present

## 2019-10-18 DIAGNOSIS — R630 Anorexia: Secondary | ICD-10-CM | POA: Diagnosis not present

## 2019-10-19 DIAGNOSIS — R630 Anorexia: Secondary | ICD-10-CM | POA: Diagnosis not present

## 2019-10-19 DIAGNOSIS — I639 Cerebral infarction, unspecified: Secondary | ICD-10-CM | POA: Diagnosis not present

## 2019-10-19 DIAGNOSIS — I4891 Unspecified atrial fibrillation: Secondary | ICD-10-CM | POA: Diagnosis not present

## 2019-10-19 DIAGNOSIS — I10 Essential (primary) hypertension: Secondary | ICD-10-CM | POA: Diagnosis not present

## 2019-10-19 DIAGNOSIS — E039 Hypothyroidism, unspecified: Secondary | ICD-10-CM | POA: Diagnosis not present

## 2019-10-19 DIAGNOSIS — E46 Unspecified protein-calorie malnutrition: Secondary | ICD-10-CM | POA: Diagnosis not present

## 2019-10-24 ENCOUNTER — Other Ambulatory Visit: Payer: Self-pay | Admitting: Family Medicine

## 2019-10-24 DIAGNOSIS — R531 Weakness: Secondary | ICD-10-CM | POA: Diagnosis not present

## 2019-10-24 DIAGNOSIS — E46 Unspecified protein-calorie malnutrition: Secondary | ICD-10-CM | POA: Diagnosis not present

## 2019-10-24 DIAGNOSIS — I4891 Unspecified atrial fibrillation: Secondary | ICD-10-CM | POA: Diagnosis not present

## 2019-10-24 DIAGNOSIS — E039 Hypothyroidism, unspecified: Secondary | ICD-10-CM | POA: Diagnosis not present

## 2019-10-24 DIAGNOSIS — I10 Essential (primary) hypertension: Secondary | ICD-10-CM | POA: Diagnosis not present

## 2019-10-24 DIAGNOSIS — R630 Anorexia: Secondary | ICD-10-CM | POA: Diagnosis not present

## 2019-10-24 DIAGNOSIS — R4701 Aphasia: Secondary | ICD-10-CM | POA: Diagnosis not present

## 2019-10-24 DIAGNOSIS — I639 Cerebral infarction, unspecified: Secondary | ICD-10-CM | POA: Diagnosis not present

## 2019-10-25 DIAGNOSIS — E46 Unspecified protein-calorie malnutrition: Secondary | ICD-10-CM | POA: Diagnosis not present

## 2019-10-25 DIAGNOSIS — R630 Anorexia: Secondary | ICD-10-CM | POA: Diagnosis not present

## 2019-10-25 DIAGNOSIS — E039 Hypothyroidism, unspecified: Secondary | ICD-10-CM | POA: Diagnosis not present

## 2019-10-25 DIAGNOSIS — I4891 Unspecified atrial fibrillation: Secondary | ICD-10-CM | POA: Diagnosis not present

## 2019-10-25 DIAGNOSIS — I639 Cerebral infarction, unspecified: Secondary | ICD-10-CM | POA: Diagnosis not present

## 2019-10-25 DIAGNOSIS — I10 Essential (primary) hypertension: Secondary | ICD-10-CM | POA: Diagnosis not present

## 2019-10-26 DIAGNOSIS — E46 Unspecified protein-calorie malnutrition: Secondary | ICD-10-CM | POA: Diagnosis not present

## 2019-10-26 DIAGNOSIS — E039 Hypothyroidism, unspecified: Secondary | ICD-10-CM | POA: Diagnosis not present

## 2019-10-26 DIAGNOSIS — I4891 Unspecified atrial fibrillation: Secondary | ICD-10-CM | POA: Diagnosis not present

## 2019-10-26 DIAGNOSIS — I10 Essential (primary) hypertension: Secondary | ICD-10-CM | POA: Diagnosis not present

## 2019-10-26 DIAGNOSIS — R630 Anorexia: Secondary | ICD-10-CM | POA: Diagnosis not present

## 2019-10-26 DIAGNOSIS — I639 Cerebral infarction, unspecified: Secondary | ICD-10-CM | POA: Diagnosis not present

## 2019-10-31 DIAGNOSIS — I639 Cerebral infarction, unspecified: Secondary | ICD-10-CM | POA: Diagnosis not present

## 2019-10-31 DIAGNOSIS — R630 Anorexia: Secondary | ICD-10-CM | POA: Diagnosis not present

## 2019-10-31 DIAGNOSIS — I4891 Unspecified atrial fibrillation: Secondary | ICD-10-CM | POA: Diagnosis not present

## 2019-10-31 DIAGNOSIS — E039 Hypothyroidism, unspecified: Secondary | ICD-10-CM | POA: Diagnosis not present

## 2019-10-31 DIAGNOSIS — E46 Unspecified protein-calorie malnutrition: Secondary | ICD-10-CM | POA: Diagnosis not present

## 2019-10-31 DIAGNOSIS — I10 Essential (primary) hypertension: Secondary | ICD-10-CM | POA: Diagnosis not present

## 2019-11-02 DIAGNOSIS — I4891 Unspecified atrial fibrillation: Secondary | ICD-10-CM | POA: Diagnosis not present

## 2019-11-02 DIAGNOSIS — I10 Essential (primary) hypertension: Secondary | ICD-10-CM | POA: Diagnosis not present

## 2019-11-02 DIAGNOSIS — R630 Anorexia: Secondary | ICD-10-CM | POA: Diagnosis not present

## 2019-11-02 DIAGNOSIS — E039 Hypothyroidism, unspecified: Secondary | ICD-10-CM | POA: Diagnosis not present

## 2019-11-02 DIAGNOSIS — E46 Unspecified protein-calorie malnutrition: Secondary | ICD-10-CM | POA: Diagnosis not present

## 2019-11-02 DIAGNOSIS — I639 Cerebral infarction, unspecified: Secondary | ICD-10-CM | POA: Diagnosis not present

## 2019-11-07 DIAGNOSIS — I639 Cerebral infarction, unspecified: Secondary | ICD-10-CM | POA: Diagnosis not present

## 2019-11-07 DIAGNOSIS — E039 Hypothyroidism, unspecified: Secondary | ICD-10-CM | POA: Diagnosis not present

## 2019-11-07 DIAGNOSIS — I4891 Unspecified atrial fibrillation: Secondary | ICD-10-CM | POA: Diagnosis not present

## 2019-11-07 DIAGNOSIS — R630 Anorexia: Secondary | ICD-10-CM | POA: Diagnosis not present

## 2019-11-07 DIAGNOSIS — E46 Unspecified protein-calorie malnutrition: Secondary | ICD-10-CM | POA: Diagnosis not present

## 2019-11-07 DIAGNOSIS — I10 Essential (primary) hypertension: Secondary | ICD-10-CM | POA: Diagnosis not present

## 2019-11-08 DIAGNOSIS — E039 Hypothyroidism, unspecified: Secondary | ICD-10-CM | POA: Diagnosis not present

## 2019-11-08 DIAGNOSIS — I639 Cerebral infarction, unspecified: Secondary | ICD-10-CM | POA: Diagnosis not present

## 2019-11-08 DIAGNOSIS — R630 Anorexia: Secondary | ICD-10-CM | POA: Diagnosis not present

## 2019-11-08 DIAGNOSIS — E46 Unspecified protein-calorie malnutrition: Secondary | ICD-10-CM | POA: Diagnosis not present

## 2019-11-08 DIAGNOSIS — I4891 Unspecified atrial fibrillation: Secondary | ICD-10-CM | POA: Diagnosis not present

## 2019-11-08 DIAGNOSIS — I10 Essential (primary) hypertension: Secondary | ICD-10-CM | POA: Diagnosis not present

## 2019-11-09 DIAGNOSIS — I639 Cerebral infarction, unspecified: Secondary | ICD-10-CM | POA: Diagnosis not present

## 2019-11-09 DIAGNOSIS — E46 Unspecified protein-calorie malnutrition: Secondary | ICD-10-CM | POA: Diagnosis not present

## 2019-11-09 DIAGNOSIS — E039 Hypothyroidism, unspecified: Secondary | ICD-10-CM | POA: Diagnosis not present

## 2019-11-09 DIAGNOSIS — I4891 Unspecified atrial fibrillation: Secondary | ICD-10-CM | POA: Diagnosis not present

## 2019-11-09 DIAGNOSIS — I10 Essential (primary) hypertension: Secondary | ICD-10-CM | POA: Diagnosis not present

## 2019-11-09 DIAGNOSIS — R630 Anorexia: Secondary | ICD-10-CM | POA: Diagnosis not present

## 2019-11-14 DIAGNOSIS — E46 Unspecified protein-calorie malnutrition: Secondary | ICD-10-CM | POA: Diagnosis not present

## 2019-11-14 DIAGNOSIS — R630 Anorexia: Secondary | ICD-10-CM | POA: Diagnosis not present

## 2019-11-14 DIAGNOSIS — I639 Cerebral infarction, unspecified: Secondary | ICD-10-CM | POA: Diagnosis not present

## 2019-11-14 DIAGNOSIS — I4891 Unspecified atrial fibrillation: Secondary | ICD-10-CM | POA: Diagnosis not present

## 2019-11-14 DIAGNOSIS — E039 Hypothyroidism, unspecified: Secondary | ICD-10-CM | POA: Diagnosis not present

## 2019-11-14 DIAGNOSIS — I10 Essential (primary) hypertension: Secondary | ICD-10-CM | POA: Diagnosis not present

## 2019-11-16 DIAGNOSIS — E46 Unspecified protein-calorie malnutrition: Secondary | ICD-10-CM | POA: Diagnosis not present

## 2019-11-16 DIAGNOSIS — E039 Hypothyroidism, unspecified: Secondary | ICD-10-CM | POA: Diagnosis not present

## 2019-11-16 DIAGNOSIS — R630 Anorexia: Secondary | ICD-10-CM | POA: Diagnosis not present

## 2019-11-16 DIAGNOSIS — I4891 Unspecified atrial fibrillation: Secondary | ICD-10-CM | POA: Diagnosis not present

## 2019-11-16 DIAGNOSIS — I639 Cerebral infarction, unspecified: Secondary | ICD-10-CM | POA: Diagnosis not present

## 2019-11-16 DIAGNOSIS — I10 Essential (primary) hypertension: Secondary | ICD-10-CM | POA: Diagnosis not present

## 2019-11-20 DIAGNOSIS — R630 Anorexia: Secondary | ICD-10-CM | POA: Diagnosis not present

## 2019-11-20 DIAGNOSIS — I10 Essential (primary) hypertension: Secondary | ICD-10-CM | POA: Diagnosis not present

## 2019-11-20 DIAGNOSIS — E46 Unspecified protein-calorie malnutrition: Secondary | ICD-10-CM | POA: Diagnosis not present

## 2019-11-20 DIAGNOSIS — E039 Hypothyroidism, unspecified: Secondary | ICD-10-CM | POA: Diagnosis not present

## 2019-11-20 DIAGNOSIS — I4891 Unspecified atrial fibrillation: Secondary | ICD-10-CM | POA: Diagnosis not present

## 2019-11-20 DIAGNOSIS — I639 Cerebral infarction, unspecified: Secondary | ICD-10-CM | POA: Diagnosis not present

## 2019-11-23 DIAGNOSIS — I639 Cerebral infarction, unspecified: Secondary | ICD-10-CM | POA: Diagnosis not present

## 2019-11-23 DIAGNOSIS — R630 Anorexia: Secondary | ICD-10-CM | POA: Diagnosis not present

## 2019-11-23 DIAGNOSIS — R531 Weakness: Secondary | ICD-10-CM | POA: Diagnosis not present

## 2019-11-23 DIAGNOSIS — E46 Unspecified protein-calorie malnutrition: Secondary | ICD-10-CM | POA: Diagnosis not present

## 2019-11-23 DIAGNOSIS — I10 Essential (primary) hypertension: Secondary | ICD-10-CM | POA: Diagnosis not present

## 2019-11-23 DIAGNOSIS — R4701 Aphasia: Secondary | ICD-10-CM | POA: Diagnosis not present

## 2019-11-23 DIAGNOSIS — E039 Hypothyroidism, unspecified: Secondary | ICD-10-CM | POA: Diagnosis not present

## 2019-11-23 DIAGNOSIS — I4891 Unspecified atrial fibrillation: Secondary | ICD-10-CM | POA: Diagnosis not present

## 2019-11-28 DIAGNOSIS — R630 Anorexia: Secondary | ICD-10-CM | POA: Diagnosis not present

## 2019-11-28 DIAGNOSIS — I639 Cerebral infarction, unspecified: Secondary | ICD-10-CM | POA: Diagnosis not present

## 2019-11-28 DIAGNOSIS — I4891 Unspecified atrial fibrillation: Secondary | ICD-10-CM | POA: Diagnosis not present

## 2019-11-28 DIAGNOSIS — E039 Hypothyroidism, unspecified: Secondary | ICD-10-CM | POA: Diagnosis not present

## 2019-11-28 DIAGNOSIS — E46 Unspecified protein-calorie malnutrition: Secondary | ICD-10-CM | POA: Diagnosis not present

## 2019-11-28 DIAGNOSIS — I10 Essential (primary) hypertension: Secondary | ICD-10-CM | POA: Diagnosis not present

## 2019-11-30 DIAGNOSIS — R630 Anorexia: Secondary | ICD-10-CM | POA: Diagnosis not present

## 2019-11-30 DIAGNOSIS — E46 Unspecified protein-calorie malnutrition: Secondary | ICD-10-CM | POA: Diagnosis not present

## 2019-11-30 DIAGNOSIS — I10 Essential (primary) hypertension: Secondary | ICD-10-CM | POA: Diagnosis not present

## 2019-11-30 DIAGNOSIS — I639 Cerebral infarction, unspecified: Secondary | ICD-10-CM | POA: Diagnosis not present

## 2019-11-30 DIAGNOSIS — E039 Hypothyroidism, unspecified: Secondary | ICD-10-CM | POA: Diagnosis not present

## 2019-11-30 DIAGNOSIS — I4891 Unspecified atrial fibrillation: Secondary | ICD-10-CM | POA: Diagnosis not present

## 2019-12-04 DIAGNOSIS — E039 Hypothyroidism, unspecified: Secondary | ICD-10-CM | POA: Diagnosis not present

## 2019-12-04 DIAGNOSIS — I4891 Unspecified atrial fibrillation: Secondary | ICD-10-CM | POA: Diagnosis not present

## 2019-12-04 DIAGNOSIS — I10 Essential (primary) hypertension: Secondary | ICD-10-CM | POA: Diagnosis not present

## 2019-12-04 DIAGNOSIS — R630 Anorexia: Secondary | ICD-10-CM | POA: Diagnosis not present

## 2019-12-04 DIAGNOSIS — I639 Cerebral infarction, unspecified: Secondary | ICD-10-CM | POA: Diagnosis not present

## 2019-12-04 DIAGNOSIS — E46 Unspecified protein-calorie malnutrition: Secondary | ICD-10-CM | POA: Diagnosis not present

## 2019-12-05 DIAGNOSIS — R630 Anorexia: Secondary | ICD-10-CM | POA: Diagnosis not present

## 2019-12-05 DIAGNOSIS — I10 Essential (primary) hypertension: Secondary | ICD-10-CM | POA: Diagnosis not present

## 2019-12-05 DIAGNOSIS — E039 Hypothyroidism, unspecified: Secondary | ICD-10-CM | POA: Diagnosis not present

## 2019-12-05 DIAGNOSIS — I4891 Unspecified atrial fibrillation: Secondary | ICD-10-CM | POA: Diagnosis not present

## 2019-12-05 DIAGNOSIS — I639 Cerebral infarction, unspecified: Secondary | ICD-10-CM | POA: Diagnosis not present

## 2019-12-05 DIAGNOSIS — E46 Unspecified protein-calorie malnutrition: Secondary | ICD-10-CM | POA: Diagnosis not present

## 2019-12-11 DIAGNOSIS — I639 Cerebral infarction, unspecified: Secondary | ICD-10-CM | POA: Diagnosis not present

## 2019-12-11 DIAGNOSIS — R630 Anorexia: Secondary | ICD-10-CM | POA: Diagnosis not present

## 2019-12-11 DIAGNOSIS — I10 Essential (primary) hypertension: Secondary | ICD-10-CM | POA: Diagnosis not present

## 2019-12-11 DIAGNOSIS — E039 Hypothyroidism, unspecified: Secondary | ICD-10-CM | POA: Diagnosis not present

## 2019-12-11 DIAGNOSIS — E46 Unspecified protein-calorie malnutrition: Secondary | ICD-10-CM | POA: Diagnosis not present

## 2019-12-11 DIAGNOSIS — I4891 Unspecified atrial fibrillation: Secondary | ICD-10-CM | POA: Diagnosis not present

## 2019-12-12 DIAGNOSIS — R630 Anorexia: Secondary | ICD-10-CM | POA: Diagnosis not present

## 2019-12-12 DIAGNOSIS — I639 Cerebral infarction, unspecified: Secondary | ICD-10-CM | POA: Diagnosis not present

## 2019-12-12 DIAGNOSIS — E039 Hypothyroidism, unspecified: Secondary | ICD-10-CM | POA: Diagnosis not present

## 2019-12-12 DIAGNOSIS — I4891 Unspecified atrial fibrillation: Secondary | ICD-10-CM | POA: Diagnosis not present

## 2019-12-12 DIAGNOSIS — E46 Unspecified protein-calorie malnutrition: Secondary | ICD-10-CM | POA: Diagnosis not present

## 2019-12-12 DIAGNOSIS — I10 Essential (primary) hypertension: Secondary | ICD-10-CM | POA: Diagnosis not present

## 2019-12-14 DIAGNOSIS — I10 Essential (primary) hypertension: Secondary | ICD-10-CM | POA: Diagnosis not present

## 2019-12-14 DIAGNOSIS — E039 Hypothyroidism, unspecified: Secondary | ICD-10-CM | POA: Diagnosis not present

## 2019-12-14 DIAGNOSIS — I639 Cerebral infarction, unspecified: Secondary | ICD-10-CM | POA: Diagnosis not present

## 2019-12-14 DIAGNOSIS — R630 Anorexia: Secondary | ICD-10-CM | POA: Diagnosis not present

## 2019-12-14 DIAGNOSIS — E46 Unspecified protein-calorie malnutrition: Secondary | ICD-10-CM | POA: Diagnosis not present

## 2019-12-14 DIAGNOSIS — I4891 Unspecified atrial fibrillation: Secondary | ICD-10-CM | POA: Diagnosis not present

## 2019-12-19 DIAGNOSIS — R630 Anorexia: Secondary | ICD-10-CM | POA: Diagnosis not present

## 2019-12-19 DIAGNOSIS — I4891 Unspecified atrial fibrillation: Secondary | ICD-10-CM | POA: Diagnosis not present

## 2019-12-19 DIAGNOSIS — E039 Hypothyroidism, unspecified: Secondary | ICD-10-CM | POA: Diagnosis not present

## 2019-12-19 DIAGNOSIS — I639 Cerebral infarction, unspecified: Secondary | ICD-10-CM | POA: Diagnosis not present

## 2019-12-19 DIAGNOSIS — E46 Unspecified protein-calorie malnutrition: Secondary | ICD-10-CM | POA: Diagnosis not present

## 2019-12-19 DIAGNOSIS — I10 Essential (primary) hypertension: Secondary | ICD-10-CM | POA: Diagnosis not present

## 2019-12-20 DIAGNOSIS — I10 Essential (primary) hypertension: Secondary | ICD-10-CM | POA: Diagnosis not present

## 2019-12-20 DIAGNOSIS — I4891 Unspecified atrial fibrillation: Secondary | ICD-10-CM | POA: Diagnosis not present

## 2019-12-20 DIAGNOSIS — E46 Unspecified protein-calorie malnutrition: Secondary | ICD-10-CM | POA: Diagnosis not present

## 2019-12-20 DIAGNOSIS — E039 Hypothyroidism, unspecified: Secondary | ICD-10-CM | POA: Diagnosis not present

## 2019-12-20 DIAGNOSIS — I639 Cerebral infarction, unspecified: Secondary | ICD-10-CM | POA: Diagnosis not present

## 2019-12-20 DIAGNOSIS — R630 Anorexia: Secondary | ICD-10-CM | POA: Diagnosis not present

## 2019-12-21 DIAGNOSIS — I10 Essential (primary) hypertension: Secondary | ICD-10-CM | POA: Diagnosis not present

## 2019-12-21 DIAGNOSIS — I639 Cerebral infarction, unspecified: Secondary | ICD-10-CM | POA: Diagnosis not present

## 2019-12-21 DIAGNOSIS — E039 Hypothyroidism, unspecified: Secondary | ICD-10-CM | POA: Diagnosis not present

## 2019-12-21 DIAGNOSIS — E46 Unspecified protein-calorie malnutrition: Secondary | ICD-10-CM | POA: Diagnosis not present

## 2019-12-21 DIAGNOSIS — I4891 Unspecified atrial fibrillation: Secondary | ICD-10-CM | POA: Diagnosis not present

## 2019-12-21 DIAGNOSIS — R630 Anorexia: Secondary | ICD-10-CM | POA: Diagnosis not present

## 2019-12-23 DIAGNOSIS — E039 Hypothyroidism, unspecified: Secondary | ICD-10-CM | POA: Diagnosis not present

## 2019-12-23 DIAGNOSIS — I10 Essential (primary) hypertension: Secondary | ICD-10-CM | POA: Diagnosis not present

## 2019-12-23 DIAGNOSIS — I4891 Unspecified atrial fibrillation: Secondary | ICD-10-CM | POA: Diagnosis not present

## 2019-12-23 DIAGNOSIS — R630 Anorexia: Secondary | ICD-10-CM | POA: Diagnosis not present

## 2019-12-23 DIAGNOSIS — I639 Cerebral infarction, unspecified: Secondary | ICD-10-CM | POA: Diagnosis not present

## 2019-12-23 DIAGNOSIS — E46 Unspecified protein-calorie malnutrition: Secondary | ICD-10-CM | POA: Diagnosis not present

## 2019-12-24 DIAGNOSIS — I4891 Unspecified atrial fibrillation: Secondary | ICD-10-CM | POA: Diagnosis not present

## 2019-12-24 DIAGNOSIS — E039 Hypothyroidism, unspecified: Secondary | ICD-10-CM | POA: Diagnosis not present

## 2019-12-24 DIAGNOSIS — R630 Anorexia: Secondary | ICD-10-CM | POA: Diagnosis not present

## 2019-12-24 DIAGNOSIS — E46 Unspecified protein-calorie malnutrition: Secondary | ICD-10-CM | POA: Diagnosis not present

## 2019-12-24 DIAGNOSIS — R4701 Aphasia: Secondary | ICD-10-CM | POA: Diagnosis not present

## 2019-12-24 DIAGNOSIS — I639 Cerebral infarction, unspecified: Secondary | ICD-10-CM | POA: Diagnosis not present

## 2019-12-24 DIAGNOSIS — R531 Weakness: Secondary | ICD-10-CM | POA: Diagnosis not present

## 2019-12-24 DIAGNOSIS — I10 Essential (primary) hypertension: Secondary | ICD-10-CM | POA: Diagnosis not present

## 2019-12-26 DIAGNOSIS — E039 Hypothyroidism, unspecified: Secondary | ICD-10-CM | POA: Diagnosis not present

## 2019-12-26 DIAGNOSIS — I10 Essential (primary) hypertension: Secondary | ICD-10-CM | POA: Diagnosis not present

## 2019-12-26 DIAGNOSIS — I639 Cerebral infarction, unspecified: Secondary | ICD-10-CM | POA: Diagnosis not present

## 2019-12-26 DIAGNOSIS — R630 Anorexia: Secondary | ICD-10-CM | POA: Diagnosis not present

## 2019-12-26 DIAGNOSIS — E46 Unspecified protein-calorie malnutrition: Secondary | ICD-10-CM | POA: Diagnosis not present

## 2019-12-26 DIAGNOSIS — I4891 Unspecified atrial fibrillation: Secondary | ICD-10-CM | POA: Diagnosis not present

## 2019-12-28 DIAGNOSIS — E039 Hypothyroidism, unspecified: Secondary | ICD-10-CM | POA: Diagnosis not present

## 2019-12-28 DIAGNOSIS — I10 Essential (primary) hypertension: Secondary | ICD-10-CM | POA: Diagnosis not present

## 2019-12-28 DIAGNOSIS — E46 Unspecified protein-calorie malnutrition: Secondary | ICD-10-CM | POA: Diagnosis not present

## 2019-12-28 DIAGNOSIS — I4891 Unspecified atrial fibrillation: Secondary | ICD-10-CM | POA: Diagnosis not present

## 2019-12-28 DIAGNOSIS — I639 Cerebral infarction, unspecified: Secondary | ICD-10-CM | POA: Diagnosis not present

## 2019-12-28 DIAGNOSIS — R630 Anorexia: Secondary | ICD-10-CM | POA: Diagnosis not present

## 2020-01-02 DIAGNOSIS — R630 Anorexia: Secondary | ICD-10-CM | POA: Diagnosis not present

## 2020-01-02 DIAGNOSIS — I4891 Unspecified atrial fibrillation: Secondary | ICD-10-CM | POA: Diagnosis not present

## 2020-01-02 DIAGNOSIS — I639 Cerebral infarction, unspecified: Secondary | ICD-10-CM | POA: Diagnosis not present

## 2020-01-02 DIAGNOSIS — E039 Hypothyroidism, unspecified: Secondary | ICD-10-CM | POA: Diagnosis not present

## 2020-01-02 DIAGNOSIS — I10 Essential (primary) hypertension: Secondary | ICD-10-CM | POA: Diagnosis not present

## 2020-01-02 DIAGNOSIS — E46 Unspecified protein-calorie malnutrition: Secondary | ICD-10-CM | POA: Diagnosis not present

## 2020-01-03 ENCOUNTER — Other Ambulatory Visit: Payer: Self-pay | Admitting: *Deleted

## 2020-01-03 ENCOUNTER — Telehealth: Payer: Self-pay | Admitting: Family Medicine

## 2020-01-03 DIAGNOSIS — E039 Hypothyroidism, unspecified: Secondary | ICD-10-CM | POA: Diagnosis not present

## 2020-01-03 DIAGNOSIS — E46 Unspecified protein-calorie malnutrition: Secondary | ICD-10-CM | POA: Diagnosis not present

## 2020-01-03 DIAGNOSIS — R630 Anorexia: Secondary | ICD-10-CM | POA: Diagnosis not present

## 2020-01-03 DIAGNOSIS — I639 Cerebral infarction, unspecified: Secondary | ICD-10-CM | POA: Diagnosis not present

## 2020-01-03 DIAGNOSIS — I4891 Unspecified atrial fibrillation: Secondary | ICD-10-CM | POA: Diagnosis not present

## 2020-01-03 DIAGNOSIS — I10 Essential (primary) hypertension: Secondary | ICD-10-CM | POA: Diagnosis not present

## 2020-01-03 MED ORDER — PHENAZOPYRIDINE HCL 100 MG PO TABS: 100.0000 mg | ORAL_TABLET | Freq: Three times a day (TID) | ORAL | 2 refills | Status: AC | PRN

## 2020-01-03 NOTE — Telephone Encounter (Signed)
Pyridium 100 mg, 1 taken 3 times daily as needed, #15, if any ongoing trouble let us know, 2 refills

## 2020-01-03 NOTE — Telephone Encounter (Signed)
Juliann Pulse from [facility] contacted office. Pt foley was changed yesterday and is in the correct place but pt is having pain with foley. Pt is taking Ativan and Morphine. Juliann Pulse would like Pyridium called in to help with bladder spasm because she is sure that is what is causing the pain. Please advise. Thank you.  [place]

## 2020-01-04 DIAGNOSIS — E039 Hypothyroidism, unspecified: Secondary | ICD-10-CM | POA: Diagnosis not present

## 2020-01-04 DIAGNOSIS — R630 Anorexia: Secondary | ICD-10-CM | POA: Diagnosis not present

## 2020-01-04 DIAGNOSIS — I10 Essential (primary) hypertension: Secondary | ICD-10-CM | POA: Diagnosis not present

## 2020-01-04 DIAGNOSIS — I639 Cerebral infarction, unspecified: Secondary | ICD-10-CM | POA: Diagnosis not present

## 2020-01-04 DIAGNOSIS — E46 Unspecified protein-calorie malnutrition: Secondary | ICD-10-CM | POA: Diagnosis not present

## 2020-01-04 DIAGNOSIS — I4891 Unspecified atrial fibrillation: Secondary | ICD-10-CM | POA: Diagnosis not present

## 2020-01-08 DIAGNOSIS — I4891 Unspecified atrial fibrillation: Secondary | ICD-10-CM | POA: Diagnosis not present

## 2020-01-08 DIAGNOSIS — E46 Unspecified protein-calorie malnutrition: Secondary | ICD-10-CM | POA: Diagnosis not present

## 2020-01-08 DIAGNOSIS — R630 Anorexia: Secondary | ICD-10-CM | POA: Diagnosis not present

## 2020-01-08 DIAGNOSIS — I10 Essential (primary) hypertension: Secondary | ICD-10-CM | POA: Diagnosis not present

## 2020-01-08 DIAGNOSIS — I639 Cerebral infarction, unspecified: Secondary | ICD-10-CM | POA: Diagnosis not present

## 2020-01-08 DIAGNOSIS — E039 Hypothyroidism, unspecified: Secondary | ICD-10-CM | POA: Diagnosis not present

## 2020-01-11 DIAGNOSIS — R630 Anorexia: Secondary | ICD-10-CM | POA: Diagnosis not present

## 2020-01-11 DIAGNOSIS — E46 Unspecified protein-calorie malnutrition: Secondary | ICD-10-CM | POA: Diagnosis not present

## 2020-01-11 DIAGNOSIS — I4891 Unspecified atrial fibrillation: Secondary | ICD-10-CM | POA: Diagnosis not present

## 2020-01-11 DIAGNOSIS — I10 Essential (primary) hypertension: Secondary | ICD-10-CM | POA: Diagnosis not present

## 2020-01-11 DIAGNOSIS — E039 Hypothyroidism, unspecified: Secondary | ICD-10-CM | POA: Diagnosis not present

## 2020-01-11 DIAGNOSIS — I639 Cerebral infarction, unspecified: Secondary | ICD-10-CM | POA: Diagnosis not present

## 2020-01-13 DIAGNOSIS — E039 Hypothyroidism, unspecified: Secondary | ICD-10-CM | POA: Diagnosis not present

## 2020-01-13 DIAGNOSIS — I639 Cerebral infarction, unspecified: Secondary | ICD-10-CM | POA: Diagnosis not present

## 2020-01-13 DIAGNOSIS — I4891 Unspecified atrial fibrillation: Secondary | ICD-10-CM | POA: Diagnosis not present

## 2020-01-13 DIAGNOSIS — R630 Anorexia: Secondary | ICD-10-CM | POA: Diagnosis not present

## 2020-01-13 DIAGNOSIS — I10 Essential (primary) hypertension: Secondary | ICD-10-CM | POA: Diagnosis not present

## 2020-01-13 DIAGNOSIS — E46 Unspecified protein-calorie malnutrition: Secondary | ICD-10-CM | POA: Diagnosis not present

## 2020-01-15 ENCOUNTER — Other Ambulatory Visit: Payer: Self-pay | Admitting: Family Medicine

## 2020-01-15 DIAGNOSIS — E46 Unspecified protein-calorie malnutrition: Secondary | ICD-10-CM | POA: Diagnosis not present

## 2020-01-15 DIAGNOSIS — I639 Cerebral infarction, unspecified: Secondary | ICD-10-CM | POA: Diagnosis not present

## 2020-01-15 DIAGNOSIS — I4891 Unspecified atrial fibrillation: Secondary | ICD-10-CM | POA: Diagnosis not present

## 2020-01-15 DIAGNOSIS — E039 Hypothyroidism, unspecified: Secondary | ICD-10-CM | POA: Diagnosis not present

## 2020-01-15 DIAGNOSIS — R630 Anorexia: Secondary | ICD-10-CM | POA: Diagnosis not present

## 2020-01-15 DIAGNOSIS — I10 Essential (primary) hypertension: Secondary | ICD-10-CM | POA: Diagnosis not present

## 2020-01-16 ENCOUNTER — Ambulatory Visit: Payer: Medicare Other | Admitting: Family Medicine

## 2020-01-16 DIAGNOSIS — E039 Hypothyroidism, unspecified: Secondary | ICD-10-CM | POA: Diagnosis not present

## 2020-01-16 DIAGNOSIS — E46 Unspecified protein-calorie malnutrition: Secondary | ICD-10-CM | POA: Diagnosis not present

## 2020-01-16 DIAGNOSIS — I639 Cerebral infarction, unspecified: Secondary | ICD-10-CM | POA: Diagnosis not present

## 2020-01-16 DIAGNOSIS — I10 Essential (primary) hypertension: Secondary | ICD-10-CM | POA: Diagnosis not present

## 2020-01-16 DIAGNOSIS — R630 Anorexia: Secondary | ICD-10-CM | POA: Diagnosis not present

## 2020-01-16 DIAGNOSIS — I4891 Unspecified atrial fibrillation: Secondary | ICD-10-CM | POA: Diagnosis not present

## 2020-01-17 DIAGNOSIS — I4891 Unspecified atrial fibrillation: Secondary | ICD-10-CM | POA: Diagnosis not present

## 2020-01-17 DIAGNOSIS — I639 Cerebral infarction, unspecified: Secondary | ICD-10-CM | POA: Diagnosis not present

## 2020-01-17 DIAGNOSIS — E039 Hypothyroidism, unspecified: Secondary | ICD-10-CM | POA: Diagnosis not present

## 2020-01-17 DIAGNOSIS — E46 Unspecified protein-calorie malnutrition: Secondary | ICD-10-CM | POA: Diagnosis not present

## 2020-01-17 DIAGNOSIS — I10 Essential (primary) hypertension: Secondary | ICD-10-CM | POA: Diagnosis not present

## 2020-01-17 DIAGNOSIS — R630 Anorexia: Secondary | ICD-10-CM | POA: Diagnosis not present

## 2020-01-18 DIAGNOSIS — I639 Cerebral infarction, unspecified: Secondary | ICD-10-CM | POA: Diagnosis not present

## 2020-01-18 DIAGNOSIS — R630 Anorexia: Secondary | ICD-10-CM | POA: Diagnosis not present

## 2020-01-18 DIAGNOSIS — E46 Unspecified protein-calorie malnutrition: Secondary | ICD-10-CM | POA: Diagnosis not present

## 2020-01-18 DIAGNOSIS — I10 Essential (primary) hypertension: Secondary | ICD-10-CM | POA: Diagnosis not present

## 2020-01-18 DIAGNOSIS — E039 Hypothyroidism, unspecified: Secondary | ICD-10-CM | POA: Diagnosis not present

## 2020-01-18 DIAGNOSIS — I4891 Unspecified atrial fibrillation: Secondary | ICD-10-CM | POA: Diagnosis not present

## 2020-01-19 DIAGNOSIS — I10 Essential (primary) hypertension: Secondary | ICD-10-CM | POA: Diagnosis not present

## 2020-01-19 DIAGNOSIS — R630 Anorexia: Secondary | ICD-10-CM | POA: Diagnosis not present

## 2020-01-19 DIAGNOSIS — E039 Hypothyroidism, unspecified: Secondary | ICD-10-CM | POA: Diagnosis not present

## 2020-01-19 DIAGNOSIS — E46 Unspecified protein-calorie malnutrition: Secondary | ICD-10-CM | POA: Diagnosis not present

## 2020-01-19 DIAGNOSIS — I639 Cerebral infarction, unspecified: Secondary | ICD-10-CM | POA: Diagnosis not present

## 2020-01-19 DIAGNOSIS — I4891 Unspecified atrial fibrillation: Secondary | ICD-10-CM | POA: Diagnosis not present

## 2020-01-20 DIAGNOSIS — I4891 Unspecified atrial fibrillation: Secondary | ICD-10-CM | POA: Diagnosis not present

## 2020-01-20 DIAGNOSIS — R630 Anorexia: Secondary | ICD-10-CM | POA: Diagnosis not present

## 2020-01-20 DIAGNOSIS — E039 Hypothyroidism, unspecified: Secondary | ICD-10-CM | POA: Diagnosis not present

## 2020-01-20 DIAGNOSIS — I639 Cerebral infarction, unspecified: Secondary | ICD-10-CM | POA: Diagnosis not present

## 2020-01-20 DIAGNOSIS — E46 Unspecified protein-calorie malnutrition: Secondary | ICD-10-CM | POA: Diagnosis not present

## 2020-01-20 DIAGNOSIS — I10 Essential (primary) hypertension: Secondary | ICD-10-CM | POA: Diagnosis not present

## 2020-01-21 DIAGNOSIS — I4891 Unspecified atrial fibrillation: Secondary | ICD-10-CM | POA: Diagnosis not present

## 2020-01-21 DIAGNOSIS — I10 Essential (primary) hypertension: Secondary | ICD-10-CM | POA: Diagnosis not present

## 2020-01-21 DIAGNOSIS — I639 Cerebral infarction, unspecified: Secondary | ICD-10-CM | POA: Diagnosis not present

## 2020-01-21 DIAGNOSIS — E46 Unspecified protein-calorie malnutrition: Secondary | ICD-10-CM | POA: Diagnosis not present

## 2020-01-21 DIAGNOSIS — E039 Hypothyroidism, unspecified: Secondary | ICD-10-CM | POA: Diagnosis not present

## 2020-01-21 DIAGNOSIS — R630 Anorexia: Secondary | ICD-10-CM | POA: Diagnosis not present

## 2020-01-22 ENCOUNTER — Telehealth: Payer: Self-pay | Admitting: Family Medicine

## 2020-01-22 DIAGNOSIS — R630 Anorexia: Secondary | ICD-10-CM | POA: Diagnosis not present

## 2020-01-22 DIAGNOSIS — I4891 Unspecified atrial fibrillation: Secondary | ICD-10-CM | POA: Diagnosis not present

## 2020-01-22 DIAGNOSIS — E46 Unspecified protein-calorie malnutrition: Secondary | ICD-10-CM | POA: Diagnosis not present

## 2020-01-22 DIAGNOSIS — E039 Hypothyroidism, unspecified: Secondary | ICD-10-CM | POA: Diagnosis not present

## 2020-01-22 DIAGNOSIS — I639 Cerebral infarction, unspecified: Secondary | ICD-10-CM | POA: Diagnosis not present

## 2020-01-22 DIAGNOSIS — I10 Essential (primary) hypertension: Secondary | ICD-10-CM | POA: Diagnosis not present

## 2020-01-23 ENCOUNTER — Telehealth: Payer: Self-pay | Admitting: Family Medicine

## 2020-01-23 ENCOUNTER — Ambulatory Visit: Payer: Medicare Other | Admitting: Family Medicine

## 2020-01-23 NOTE — Telephone Encounter (Signed)
[place] off Death Certificate for Cori Henningsen [facility]) death certificate places in Dr Leamon Arnt in his office

## 2020-01-24 NOTE — Telephone Encounter (Signed)
Hospice calling to let us know that [name] passed away early this morning.

## 2020-01-24 NOTE — Telephone Encounter (Signed)
I was aware thank you

## 2020-01-24 DEATH — deceased

## 2020-01-25 NOTE — Telephone Encounter (Signed)
This was completed today.

## 2020-06-23 NOTE — Telephone Encounter (Signed)
error 

## 2020-06-28 ENCOUNTER — Ambulatory Visit: Payer: Medicare Other | Admitting: Urology
